# Patient Record
Sex: Male | Born: 1990 | Race: Black or African American | Hispanic: No | Marital: Single | Smoking: Former smoker
Health system: Southern US, Community
[De-identification: ages and names within clinical notes are randomized; demographics above are authoritative.]

## PROBLEM LIST (undated history)

## (undated) DIAGNOSIS — B20 Human immunodeficiency virus [HIV] disease: Secondary | ICD-10-CM

## (undated) DIAGNOSIS — Z21 Asymptomatic human immunodeficiency virus [HIV] infection status: Secondary | ICD-10-CM

## (undated) DIAGNOSIS — F1910 Other psychoactive substance abuse, uncomplicated: Secondary | ICD-10-CM

## (undated) DIAGNOSIS — F29 Unspecified psychosis not due to a substance or known physiological condition: Secondary | ICD-10-CM

## (undated) DIAGNOSIS — F32A Depression, unspecified: Secondary | ICD-10-CM

---

## 2011-09-27 ENCOUNTER — Emergency Department (HOSPITAL_COMMUNITY)
Admission: EM | Admit: 2011-09-27 | Discharge: 2011-09-27 | Disposition: A | Discharge status: Home / Self Care | Payer: No Typology Code available for payment source | Attending: Emergency Medicine | Admitting: Emergency Medicine

## 2011-09-27 ENCOUNTER — Emergency Department (HOSPITAL_COMMUNITY): Payer: Self-pay

## 2011-09-27 DIAGNOSIS — S161XXA Strain of muscle, fascia and tendon at neck level, initial encounter: Secondary | ICD-10-CM

## 2011-09-27 DIAGNOSIS — Y92410 Unspecified street and highway as the place of occurrence of the external cause: Secondary | ICD-10-CM | POA: Insufficient documentation

## 2011-09-27 DIAGNOSIS — S139XXA Sprain of joints and ligaments of unspecified parts of neck, initial encounter: Secondary | ICD-10-CM | POA: Insufficient documentation

## 2011-09-27 DIAGNOSIS — M542 Cervicalgia: Secondary | ICD-10-CM | POA: Insufficient documentation

## 2011-09-27 MED ORDER — IBUPROFEN 800 MG PO TABS
800.0000 mg | ORAL_TABLET | Freq: Once | ORAL | Status: AC
  Administered 2011-09-27: 800 mg via ORAL
  Filled 2011-09-27: qty 1

## 2011-09-27 MED ORDER — METHOCARBAMOL 500 MG PO TABS: 500.0000 mg | ORAL_TABLET | Freq: Two times a day (BID) | ORAL | Status: AC | PRN

## 2011-09-27 MED ORDER — NAPROXEN 500 MG PO TABS: 500.0000 mg | ORAL_TABLET | Freq: Two times a day (BID) | ORAL | Status: AC
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
## 2011-09-27 NOTE — Discharge Instructions (Signed)
Your xrays do no show any signs of fracture of dislocation of your neck bones - please use the medicines as prescribed, naprosyn twice a day - robaxin as needed for neck muscle spasm.  You will likely hurt more tomorrow than you do today - expect up to 10 days to fully recover.  See list below if you Israel't have a family doctor.  RESOURCE GUIDE  Dental Problems  Patients with Medicaid: [facility] [phone]                                           [phone] Phone:  [phone]                                                  Phone:  [phone]  If unable to pay or uninsured, contact:  [facility] or [facility]. to become qualified for the adult dental clinic.  Chronic Pain Problems Contact [facility]  [phone] Patients need to be referred by their primary care doctor.  Insufficient Money for Medicine Contact United Way:  call "211" or [facility] 419-472-7237.  No Primary Care Doctor Call [facility]  [phone] Other agencies that provide inexpensive medical care    [facility]  [phone]    [facility]  [phone]    [facility]  [phone]    [facility]  [phone]    [facility]  (939) [phone]    [facility]  [phone]  Psychological Services [facility] Health  [phone] [facility] Services  [phone] [facility] Mental Health   [phone] (emergency services (925) [phone])  Substance Abuse Resources [facility] and Drug Services  (970)737-4167 [facility] 2252606676 [facility] [place] [phone] Floydene Flock 719-885-9985 Residential & Outpatient Substance Abuse Program  937-517-7059  Abuse/Neglect [facility] Child Abuse Hotline (307)222-0608 [facility] Child Abuse Hotline 757 442 5384 (After Hours)  Emergency Shelter [facility] Ministries (445) [phone]  Maternity  Homes [facility] at [facility] [place] [facility] [place] 248-721-9124 [facility] [phone]  MRSA Hotline #:   223-548-2105    [facility] Resources  [facility] [place]     United Way                          [facility] Dept. [address] [place]                       [address]      [address]  [place]                                                Cristobal Goldmann Phone:  [phone]                                   Phone:  [phone]                 Phone:  [phone]  [facility] Phone:  [phone]  [facility] Child Abuse Hotline 971-313-3891 [phone] (After Hours)

## 2011-09-27 NOTE — ED Notes (Signed)
Patient transported to X-ray 

## 2011-10-13 ENCOUNTER — Emergency Department (HOSPITAL_COMMUNITY)
Admission: EM | Admit: 2011-10-13 | Discharge: 2011-10-13 | Disposition: A | Discharge status: Home / Self Care | Payer: No Typology Code available for payment source | Attending: Emergency Medicine | Admitting: Emergency Medicine

## 2011-10-13 ENCOUNTER — Encounter (HOSPITAL_COMMUNITY): Payer: Self-pay | Admitting: Emergency Medicine

## 2011-10-13 DIAGNOSIS — M549 Dorsalgia, unspecified: Secondary | ICD-10-CM

## 2011-10-13 DIAGNOSIS — G8929 Other chronic pain: Secondary | ICD-10-CM | POA: Insufficient documentation

## 2011-10-13 MED ORDER — IBUPROFEN 800 MG PO TABS
800.0000 mg | ORAL_TABLET | Freq: Once | ORAL | Status: AC
  Administered 2011-10-13: 800 mg via ORAL
  Filled 2011-10-13: qty 1

## 2011-10-13 MED ORDER — IBUPROFEN 800 MG PO TABS: 800.0000 mg | ORAL_TABLET | Freq: Three times a day (TID) | ORAL | Status: AC

## 2011-10-13 MED ORDER — CYCLOBENZAPRINE HCL 10 MG PO TABS: 10.0000 mg | ORAL_TABLET | Freq: Two times a day (BID) | ORAL | Status: AC | PRN

## 2011-10-13 NOTE — Discharge Instructions (Signed)
[name] take ibuprofen 800 mg every 6 hours with 24 hours and then when necessary. Use ice intermittantly. Giving you some back exercises that may help your back pain. There's also a list of PCP  from the list below. Pick a  doctor and followup with them. Return to the ER if you have any bowel or bladder problems or high fever. The work note for today is in your discharge papers.    RESOURCE GUIDE  Dental Problems  Patients with Medicaid: [facility] [phone] [address]                                           [phone] [address] Phone:  [phone]                                                  Phone:  [phone]  If unable to pay or uninsured, contact:  [facility] or [facility]. to become qualified for the adult dental clinic.  Chronic Pain Problems Contact [facility]  [phone] Patients need to be referred by their primary care doctor.  Insufficient Money for Medicine Contact United Way:  call "211" or [facility] (303) 073-5094.  No Primary Care Doctor Call [facility]  [phone] Other agencies that provide inexpensive medical care    [facility]  [phone]    [facility] Internal Medicine  [phone]    [facility]  [phone]    [facility]  [phone]    [facility]  (575) [phone]    [facility]  [phone]  Psychological Services [facility] Behavioral Health  [phone] [facility] Services  [phone] [facility] Mental Health   [phone] (emergency services 513-442-5139)  Substance Abuse Resources Alcohol and Drug Services  (917)829-9374 [facility] (680)885-3183 [facility] [place] 469-245-4146 Floydene Flock (502)[phone] Residential & Outpatient Substance Abuse Program  (757)[phone]  Abuse/Neglect [facility] Child Abuse Hotline 806-566-7211 [facility] Child Abuse Hotline (701) 196-0725 (After Hours)  Emergency  Shelter [facility] Ministries (256)104-1713  Maternity Homes Room at the [place] [facility] [place] (380) [phone] [facility] [phone]  MRSA Hotline #:   (660)183-7191    [facility] Resources  [facility] [place]     United Way                          [facility] [address] [place]                       [address]      [address]  Patrecia Pace  Michell Heinrich Phone:  [phone]                                   Phone:  [phone]                 Phone:  [phone]  [facility] Phone:  [phone]  [facility] Child Abuse Hotline (636)596-5353 (847)492-6621 (After Hours)   Back Exercises Back exercises help treat and prevent back injuries. The goal is to increase your strength in your belly (abdominal) and back muscles. These exercises can also help with flexibility. Start these exercises when told by your doctor. HOME CARE Back exercises include: Pelvic Tilt.  Lie on your back with your knees bent. Tilt your pelvis until the lower part of your back is against the floor. Hold this position 5 to 10 sec. Repeat this exercise 5 to 10 times.  Knee to Chest.  Pull 1 knee up against your chest and hold for 20 to 30 seconds. Repeat this with the other knee. This may be done with the other leg straight or bent, whichever feels better. Then, pull both knees up against your chest.  Sit-Ups or Curl-Ups.  Bend your knees 90 degrees. Start with tilting your pelvis, and do a partial, slow sit-up. Only lift your upper half 30 to 45 degrees off the floor. Take at least 2 to 3 seonds for each sit-up. Do not do sit-ups with your knees out straight. If partial sit-ups are difficult, simply do the above but with only tightening your belly (abdominal) muscles and holding it as told.  Hip-Lift.  Lie on your back with your knees  flexed 90 degrees. Push down with your feet and shoulders as you raise your hips 2 inches off the floor. Hold for 10 seconds, repeat 5 to 10 times.  Back Arches.  Lie on your stomach. Prop yourself up on bent elbows. Slowly press on your hands, causing an arch in your low back. Repeat 3 to 5 times.  Shoulder-Lifts.  Lie face down with arms beside your body. Keep hips and belly pressed to floor as you slowly lift your head and shoulders off the floor.  Do not overdo your exercises. Be careful in the beginning. Exercises may cause you some mild back discomfort. If the pain lasts for more than 15 minutes, stop the exercises until you see your doctor. Improvement with exercise for back problems is slow.  Document Released: 06/19/2010 Document Revised: 05/06/2011 Document Reviewed: 06/19/2010 [facility] Patient Information 2012 [place], [place].Back Exercises Back exercises help treat and prevent back injuries. The goal is to increase your strength in your belly (abdominal) and back muscles. These exercises can also help with flexibility. Start these exercises when told by your doctor. HOME CARE Back exercises include: Pelvic Tilt.  Lie on your back with your knees bent. Tilt your pelvis until the lower part of your back is against the floor. Hold this position 5 to 10 sec. Repeat this exercise 5 to 10 times.  Knee to Chest.  Pull 1 knee up against your chest and hold for 20 to 30 seconds. Repeat this with the other knee. This may be done with the other leg straight or bent, whichever feels better. Then, pull both knees up against your chest.  Sit-Ups or Curl-Ups.  Bend your knees 90 degrees. Start with tilting your pelvis, and do a partial, slow sit-up. Only lift your upper  half 30 to 45 degrees off the floor. Take at least 2 to 3 seonds for each sit-up. Do not do sit-ups with your knees out straight. If partial sit-ups are difficult, simply do the above but with only tightening your belly (abdominal)  muscles and holding it as told.  Hip-Lift.  Lie on your back with your knees flexed 90 degrees. Push down with your feet and shoulders as you raise your hips 2 inches off the floor. Hold for 10 seconds, repeat 5 to 10 times.  Back Arches.  Lie on your stomach. Prop yourself up on bent elbows. Slowly press on your hands, causing an arch in your low back. Repeat 3 to 5 times.  Shoulder-Lifts.  Lie face down with arms beside your body. Keep hips and belly pressed to floor as you slowly lift your head and shoulders off the floor.  Do not overdo your exercises. Be careful in the beginning. Exercises may cause you some mild back discomfort. If the pain lasts for more than 15 minutes, stop the exercises until you see your doctor. Improvement with exercise for back problems is slow.  Document Released: 06/19/2010 Document Revised: 05/06/2011 Document Reviewed: 06/19/2010 [facility] Patient Information 2012 [place], [place].Back Pain, Adult Back pain is very common. The pain often gets better over time. The cause of back pain is usually not dangerous. Most people can learn to manage their back pain on their own.  HOME CARE   Stay active. Start with short walks on flat ground if you can. Try to walk farther each day.   Do not sit, drive, or stand in one place for more than 30 minutes. Do not stay in bed.   Do not avoid exercise or work. Activity can help your back heal faster.   Be careful when you bend or lift an object. Bend at your knees, keep the object close to you, and do not twist.   Sleep on a firm mattress. Lie on your side, and bend your knees. If you lie on your back, put a pillow under your knees.   Only take medicines as told by your doctor.   Put ice on the injured area.   Put ice in a plastic bag.   Place a towel between your skin and the bag.   Leave the ice on for 15 to 20 minutes, 3 to 4 times a day for the first 2 to 3 days. After that, you can switch between ice and heat  packs.   Ask your doctor about back exercises or massage.   Avoid feeling anxious or stressed. Find good ways to deal with stress, such as exercise.  GET HELP RIGHT AWAY IF:   Your pain does not go away with rest or medicine.   Your pain does not go away in 1 week.   You have new problems.   You do not feel well.   The pain spreads into your legs.   You cannot control when you poop (bowel movement) or pee (urinate).   Your arms or legs feel weak or lose feeling (numbness).   You feel sick to your stomach (nauseous) or throw up (vomit).   You have belly (abdominal) pain.   You feel like you may pass out (faint).  MAKE SURE YOU:   Understand these instructions.   Will watch your condition.   Will get help right away if you are not doing well or get worse.  Document Released: 11/03/2007 Document Revised: 05/06/2011 Document Reviewed: 10/05/2010  [facility] Patient Information 2012 [place].
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
## 2011-10-13 NOTE — ED Notes (Signed)
Pt st's he was involved in [facility] 4/29.  St's he was given muscle relaxer and pain med.  St's he was able to get the muscle relaxer but was not able to get the pain med.  Also st's he continues to have low back pain

## 2011-10-18 NOTE — ED Provider Notes (Signed)
Medical screening examination/treatment/procedure(s) were performed by non-physician practitioner and as supervising physician I was immediately available for consultation/collaboration.  Reegan Bouffard R. Aiyah Scarpelli, MD 10/18/11 1610 

## 2014-02-13 ENCOUNTER — Encounter (HOSPITAL_COMMUNITY): Payer: Self-pay | Admitting: Emergency Medicine

## 2014-02-13 ENCOUNTER — Emergency Department (HOSPITAL_COMMUNITY)
Admission: EM | Admit: 2014-02-13 | Discharge: 2014-02-13 | Disposition: A | Discharge status: Home / Self Care | Payer: No Typology Code available for payment source | Attending: Emergency Medicine | Admitting: Emergency Medicine

## 2014-02-13 DIAGNOSIS — R51 Headache: Secondary | ICD-10-CM | POA: Insufficient documentation

## 2014-02-13 DIAGNOSIS — J020 Streptococcal pharyngitis: Secondary | ICD-10-CM | POA: Insufficient documentation

## 2014-02-13 DIAGNOSIS — F17200 Nicotine dependence, unspecified, uncomplicated: Secondary | ICD-10-CM | POA: Insufficient documentation

## 2014-02-13 DIAGNOSIS — A389 Scarlet fever, uncomplicated: Secondary | ICD-10-CM | POA: Insufficient documentation

## 2014-02-13 DIAGNOSIS — R42 Dizziness and giddiness: Secondary | ICD-10-CM | POA: Insufficient documentation

## 2014-02-13 DIAGNOSIS — A388 Scarlet fever with other complications: Secondary | ICD-10-CM

## 2014-02-13 DIAGNOSIS — R21 Rash and other nonspecific skin eruption: Secondary | ICD-10-CM | POA: Insufficient documentation

## 2014-02-13 MED ORDER — PENICILLIN G BENZATHINE 1200000 UNIT/2ML IM SUSP
1.2000 10*6.[IU] | Freq: Once | INTRAMUSCULAR | Status: AC
  Administered 2014-02-13: 1.2 10*6.[IU] via INTRAMUSCULAR
  Filled 2014-02-13: qty 2

## 2014-02-13 MED ORDER — ACETAMINOPHEN 500 MG PO TABS
1000.0000 mg | ORAL_TABLET | Freq: Once | ORAL | Status: AC
  Administered 2014-02-13: 1000 mg via ORAL
  Filled 2014-02-13: qty 2

## 2014-02-13 MED ORDER — MORPHINE SULFATE 4 MG/ML IJ SOLN: 4.0000 mg | Freq: Once | INTRAMUSCULAR | Status: DC

## 2014-02-13 MED ORDER — SODIUM CHLORIDE 0.9 % IV BOLUS (SEPSIS)
1000.0000 mL | Freq: Once | INTRAVENOUS | Status: AC
  Administered 2014-02-13: 1000 mL via INTRAVENOUS

## 2014-02-13 NOTE — ED Notes (Signed)
Pt reports severe headache x 3 days, no relief with otc meds. Having n/v and dizziness when he stands up. No acute distress noted at triage.

## 2014-02-13 NOTE — ED Notes (Signed)
POD C NURSE NOTIFIED OF PATIENTS TEMP = 103.1 ORAL :33 PM ,0916/2015.

## 2014-02-13 NOTE — ED Provider Notes (Signed)
CSN: [id number]     Arrival date & time 02/13/14  1224 History   First MD Initiated Contact with Patient 02/13/14 1621     Chief Complaint  Patient presents with  . Headache     (Consider location/radiation/quality/duration/timing/severity/associated sxs/prior Treatment) HPI  Patient presents with three days of fever, chills, lightheadedness, N/V, sore throat, rash, headache. Has decreased appetite. Has been taking tylenol, advil PM, Nyquil without improvement. Sore throat is improved from two days ago.  N/V only occurred yesterday, emesis was contents of his stomach.  Denies cough, neck pain or stiffness. Has known sick contacts at work with similar symptoms.  He is not known to be immunocompromised.  No recent travel.  No known tick bites.  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    History reviewed. No pertinent past medical history. History reviewed. No pertinent past surgical history. History reviewed. No pertinent family history. History  Substance Use Topics  . Smoking status: Current Every Day Smoker  . Smokeless tobacco: Not on file  . Alcohol Use: No    Review of Systems  All other systems reviewed and are negative.     Allergies  Review of patient's allergies indicates no known allergies.  Home Medications   Prior to Admission medications   Medication Sig Start Date End Date Taking? Authorizing Provider  methocarbamol (ROBAXIN) 500 MG tablet Take 500 mg by mouth 4 (four) times daily as needed. For muscle spasms    Historical Provider, MD   BP 97/80  Pulse 103  Temp(Src) 103.1 F (39.5 C) (Oral)  Resp 16  Ht  (1.575 m)  Wt 132 lb (59.875 kg)  BMI 24.14 kg/m2  SpO2 99% Physical Exam  Nursing note and vitals reviewed. Constitutional: He appears well-developed and well-nourished. No distress.  HENT:  Head: Normocephalic and atraumatic.  Mouth/Throat: Uvula is midline. Mucous membranes are not dry. No uvula swelling.  Posterior oropharyngeal edema and posterior oropharyngeal erythema present. No oropharyngeal exudate or tonsillar abscesses.  Neck: Neck supple.  Cardiovascular: Normal rate and regular rhythm.   Pulmonary/Chest: Effort normal and breath sounds normal. No respiratory distress. He has no wheezes. He has no rales.  Abdominal: Soft. He exhibits no distension and no mass. There is no tenderness. There is no rebound and no guarding.  Neurological: He is alert. He exhibits normal muscle tone.  Skin: Rash noted. He is not diaphoretic.  Erythematous papular rash over anterior chest.     ED Course  Procedures (including critical care time) Labs Review Labs Reviewed - No data to display  Imaging Review No results found.   EKG Interpretation None      Filed Vitals:   02/13/14 1630  BP: 97/80  Pulse: 103  Temp: 103.1 F (39.5 C)  Resp: 16   6:12 PM pt reports he is feeling much better.  Declines prescriptions for pain medications at home.      MDM   Final diagnoses:  Strep pharyngitis with scarlet fever    Febrile tachycardic but otherwise healthy young man with sore throat, erythematous rash, fever, lightheadedness, mild headache.  No neck stiffness or meningeal signs. No abdominal tenderness.  Not eating or drinking much secondary to decreased appetite.  IVF, tylenol, penicillin given.  Fever, absence of cough, bilateral tonsillar edema, erythematous rash.  Will treat empirically for strep.  Pt declined morphine and declined prescription for medications for home.  Discussed findings, treatment, and follow up  with patient.  Pt  given return precautions.  Pt verbalizes understanding and agrees with plan.       Trixie Dredge, PA-C 02/13/14 [phone]

## 2014-02-13 NOTE — ED Provider Notes (Signed)
Medical screening examination/treatment/procedure(s) were performed by non-physician practitioner and as supervising physician I was immediately available for consultation/collaboration.    Nelia Shi, MD 02/13/14 [phone]

## 2014-02-13 NOTE — ED Notes (Signed)
Pt reporting generalized body aches vs headache.  Pt believes he has the flu.  Sts people at work have been sick with the same symptoms as well.

## 2014-02-13 NOTE — Discharge Instructions (Signed)
Read the information below.  You may return to the Emergency Department at any time for worsening condition or any new symptoms that concern you.  Drink plenty of fluids and rest over the next few days.  Take tylenol and ibuprofen as needed for pain and fever. If you develop high fevers, difficulty swallowing or breathing, or you are unable to tolerate fluids by mouth, return to the ER immediately for a recheck.      Scarlet Fever  Scarlet fever is an infection that can develop with strep throat. Scarlet fever is caused by the strep germ (bacteria). It commonly happens to young children. It can spread from person to person (contagious). Antibiotic medicine will be given. It often takes about 24 to 48 hours before you will start to feel better. HOME CARE  Rest and get sleep.  Take your medicine as told. Finish it even if you start to feel better.  Gargle salt water to soothe your throat. Mix 1 teaspoon of salt with 8 ounces of water.  Drink enough fluids to keep your pee (urine) clear or pale yellow.  Eat soft or liquid foods if your throat hurts. Try milk, milk shakes, ice cream, frozen yogurt, soup, or instant breakfast milk drinks. Other good choices include sport drinks, smoothies, and frozen ice pops.  Only take medicines as told by your doctor.  Follow up with your doctor about test results as told.  Family members who get a sore throat or fever should see a doctor. GET HELP RIGHT AWAY IF:  You have drooling or swallowing problems.  You have trouble breathing.  Your voice changes.  You have neck pain.  You do not get better after 48 to 72 hours of treatment, or your problems get worse.  You have green, yellow-brown, or bloody spit (phlegm).  You have joint pain or leg puffiness (swelling).  You are pale, weak, or start to breathe fast.  You have a dry mouth, are not peeing, or have sunken eyes.  You have dark brown or bloody pee. MAKE SURE YOU:   Understand these  instructions.  Will watch your condition.  Will get help right away if you are not doing well or get worse. Document Released: 01/27/2011 Document Revised: 08/09/2011 Document Reviewed: 01/27/2011 [facility] Patient Information 2015 [place], [place]. This information is not intended to replace advice given to you by your health care provider. Make sure you discuss any questions you have with your health care provider.

## 2014-11-22 ENCOUNTER — Telehealth: Payer: Self-pay

## 2014-11-22 NOTE — Telephone Encounter (Signed)
Patient contacted regarding new intake appointment. Date and time given. Information given regarding documents needed to qualify for financial eligibility.  Quavion Boule K Donata Reddick, RN  

## 2014-12-17 ENCOUNTER — Ambulatory Visit: Payer: Self-pay

## 2015-09-23 ENCOUNTER — Telehealth: Payer: Self-pay

## 2015-09-23 NOTE — Telephone Encounter (Signed)
Patient contacted regarding new intake appointment. Date and time given. Information given regarding documents needed to qualify for financial eligibility.  Bronte Sabado K Omelia Marquart, RN  

## 2015-10-02 ENCOUNTER — Ambulatory Visit: Payer: Self-pay

## 2015-10-07 ENCOUNTER — Telehealth: Payer: Self-pay

## 2015-10-07 NOTE — Telephone Encounter (Signed)
Left message on machine for patient to call the office for new patient intake.   Laurell Josephsammy K King, RN

## 2015-10-22 ENCOUNTER — Telehealth: Payer: Self-pay

## 2015-10-22 NOTE — Telephone Encounter (Signed)
Multiple attempts to reach patient via phone.   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

## 2016-04-11 ENCOUNTER — Encounter (HOSPITAL_COMMUNITY): Payer: Self-pay | Admitting: Emergency Medicine

## 2016-04-11 ENCOUNTER — Emergency Department (HOSPITAL_COMMUNITY)
Admission: EM | Admit: 2016-04-11 | Discharge: 2016-04-11 | Disposition: A | Discharge status: Home / Self Care | Payer: Self-pay | Attending: Emergency Medicine | Admitting: Emergency Medicine

## 2016-04-11 DIAGNOSIS — R197 Diarrhea, unspecified: Secondary | ICD-10-CM | POA: Insufficient documentation

## 2016-04-11 DIAGNOSIS — F17210 Nicotine dependence, cigarettes, uncomplicated: Secondary | ICD-10-CM | POA: Insufficient documentation

## 2016-04-11 DIAGNOSIS — R112 Nausea with vomiting, unspecified: Secondary | ICD-10-CM | POA: Insufficient documentation

## 2016-04-11 LAB — CBC
HCT: 43.9 % (ref 39.0–52.0)
Hemoglobin: 15 g/dL (ref 13.0–17.0)
MCH: 33.7 pg (ref 26.0–34.0)
MCHC: 34.2 g/dL (ref 30.0–36.0)
MCV: 98.7 fL (ref 78.0–100.0)
Platelets: 257 10*3/uL (ref 150–400)
RBC: 4.45 MIL/uL (ref 4.22–5.81)
RDW: 12.7 % (ref 11.5–15.5)
WBC: 8.2 10*3/uL (ref 4.0–10.5)

## 2016-04-11 LAB — LIPASE, BLOOD: Lipase: 20 U/L (ref 11–51)

## 2016-04-11 LAB — URINE MICROSCOPIC-ADD ON
Bacteria, UA: NONE SEEN
RBC / HPF: NONE SEEN RBC/hpf (ref 0–5)
Squamous Epithelial / HPF: NONE SEEN

## 2016-04-11 LAB — COMPREHENSIVE METABOLIC PANEL
ALT: 22 U/L (ref 17–63)
AST: 44 U/L — ABNORMAL HIGH (ref 15–41)
Albumin: 4.4 g/dL (ref 3.5–5.0)
Alkaline Phosphatase: 61 U/L (ref 38–126)
Anion gap: 13 (ref 5–15)
BUN: 10 mg/dL (ref 6–20)
CO2: 25 mmol/L (ref 22–32)
Calcium: 9.8 mg/dL (ref 8.9–10.3)
Chloride: 102 mmol/L (ref 101–111)
Creatinine, Ser: 1.05 mg/dL (ref 0.61–1.24)
GFR calc Af Amer: >60 mL/min (ref >60)
GFR calc non Af Amer: >60 mL/min (ref >60)
Glucose, Bld: 120 mg/dL — ABNORMAL HIGH (ref 65–99)
Potassium: 3.8 mmol/L (ref 3.5–5.1)
Sodium: 140 mmol/L (ref 135–145)
Total Bilirubin: 0.8 mg/dL (ref 0.3–1.2)
Total Protein: 8.1 g/dL (ref 6.5–8.1)

## 2016-04-11 LAB — URINALYSIS, ROUTINE W REFLEX MICROSCOPIC
GLUCOSE, UA: NEGATIVE mg/dL
HGB URINE DIPSTICK: NEGATIVE
Ketones, ur: NEGATIVE mg/dL
Nitrite: NEGATIVE
PH: 6 (ref 5.0–8.0)
Protein, ur: 30 mg/dL — AB
SPECIFIC GRAVITY, URINE: 1.028 (ref 1.005–1.030)

## 2016-04-11 MED ORDER — SODIUM CHLORIDE 0.9 % IV BOLUS (SEPSIS)
1000.0000 mL | Freq: Once | INTRAVENOUS | Status: AC
  Administered 2016-04-11: 1000 mL via INTRAVENOUS

## 2016-04-11 MED ORDER — ONDANSETRON HCL 4 MG PO TABS: 4.0000 mg | ORAL_TABLET | Freq: Four times a day (QID) | ORAL | 0 refills | Status: DC

## 2016-04-11 MED ORDER — ONDANSETRON HCL 4 MG/2ML IJ SOLN
4.0000 mg | Freq: Once | INTRAMUSCULAR | Status: AC
  Administered 2016-04-11: 4 mg via INTRAVENOUS
  Filled 2016-04-11: qty 2

## 2016-04-11 NOTE — ED Provider Notes (Signed)
[facility]-EMERGENCY DEPT Provider Note   CSN: [id number] Arrival date & time: 04/11/16  0602     History   Chief Complaint Chief Complaint  Patient presents with  . Vomiting  . Abdominal Pain  . Neck Pain    HPI Lee Bridges is a 25 y.o. male who presents with nausea, vomiting, diarrhea. No significant PMH. He states that the vomiting started on Thursday. He has been unable to eat and drink. He only has pain when he vomits. Pain is in the neck, low back and abdomen. Emesis is non-bloody, non-bilious. Diarrhea is non-bloody. Reports associated chills. He reports a sick contact at work. Denies fever, chest pain, SOB, constant abdominal pain, flank pain, urinary symptoms. No previous abdominal surgeries.  HPI  History reviewed. No pertinent past medical history.  There are no active problems to display for this patient.   History reviewed. No pertinent surgical history.     Home Medications    Prior to Admission medications   Not on File    Family History History reviewed. No pertinent family history.  Social History Social History  Substance Use Topics  . Smoking status: Current Every Day Smoker    Packs/day: 0.50    Types: Cigarettes  . Smokeless tobacco: Never Used  . Alcohol use No     Allergies   Fish-derived products   Review of Systems Review of Systems  Constitutional: Positive for chills. Negative for fever.  Respiratory: Negative for shortness of breath.   Cardiovascular: Negative for chest pain.  Gastrointestinal: Positive for abdominal pain, diarrhea, nausea and vomiting. Negative for blood in stool.  Genitourinary: Negative for dysuria and flank pain.  Musculoskeletal: Positive for back pain.  All other systems reviewed and are negative.    Physical Exam Updated Vital Signs BP 136/87 (BP Location: Right Arm)   Pulse 104   Temp 97.7 F (36.5 C) (Oral)   Resp 19   Ht 5\' 2"  (1.575 m)   Wt 64.4 kg   SpO2 97%   BMI 25.97 kg/m   Physical  Exam  Constitutional: He is oriented to person, place, and time. He appears well-developed and well-nourished. No distress.  HENT:  Head: Normocephalic and atraumatic.  Eyes: Conjunctivae are normal. Pupils are equal, round, and reactive to light. Right eye exhibits no discharge. Left eye exhibits no discharge. No scleral icterus.  Neck: Normal range of motion.  No midline tenderness. No nuchal rigidity.  Cardiovascular: Regular rhythm.  Tachycardia present.  Exam reveals no gallop and no friction rub.   No murmur heard. Pulmonary/Chest: Effort normal and breath sounds normal. No respiratory distress. He has no wheezes. He has no rales. He exhibits no tenderness.  Abdominal: Soft. Bowel sounds are normal. He exhibits no distension and no mass. There is no tenderness. There is no rebound and no guarding. No hernia.  Musculoskeletal:  Mild low back tenderness - pt states doesn't hurt now  Neurological: He is alert and oriented to person, place, and time.  Skin: Skin is warm and dry.  Psychiatric: He has a normal mood and affect. His behavior is normal.  Nursing note and vitals reviewed.    ED Treatments / Results  Labs (all labs ordered are listed, but only abnormal results are displayed) Labs Reviewed  COMPREHENSIVE METABOLIC PANEL - Abnormal; Notable for the following:       Result Value   Glucose, Bld 120 (*)    AST 44 (*)    All other components within normal limits  URINALYSIS, ROUTINE W REFLEX MICROSCOPIC (NOT AT [facility]) - Abnormal; Notable for the following:    Color, Urine AMBER (*)    Bilirubin Urine SMALL (*)    Protein, ur 30 (*)    Leukocytes, UA SMALL (*)    All other components within normal limits  LIPASE, BLOOD  CBC  URINE MICROSCOPIC-ADD ON    EKG  EKG Interpretation None       Radiology No results found.  Procedures Procedures (including critical care time)  Medications Ordered in ED Medications  ondansetron (ZOFRAN) injection 4 mg (4 mg  Intravenous Given 04/11/16 0711)  sodium chloride 0.9 % bolus 1,000 mL (0 mLs Intravenous Stopped 04/11/16 0826)     Initial Impression / Assessment and Plan / ED Course  I have reviewed the triage vital signs and the nursing notes.  Pertinent labs & imaging results that were available during my care of the patient were reviewed by me and considered in my medical decision making (see chart for details).  Clinical Course    25 year old male with symptoms consistent with viral gastroenteritis. Patient is afebrile, not tachycardic or tachypneic, normotensive, and not hypoxic. CBC unremarkable. CMP remarkable for mild hyperglycemia (120). UA remarkable for small bilirubin, small leukocytes, 30 protein, 6-30 WBC. No urinary symptoms. IVF and antiemetics given with improvement of symptoms. PO challenge tolerated well. Patient is NAD, non-toxic, with stable VS. Patient is informed of clinical course, understands medical decision making process, and agrees with plan. Opportunity for questions provided and all questions answered. Return precautions given.  Final Clinical Impressions(s) / ED Diagnoses   Final diagnoses:  Nausea vomiting and diarrhea    New Prescriptions Discharge Medication List as of 04/11/2016 10:32 AM    START taking these medications   Details  ondansetron (ZOFRAN) 4 MG tablet Take 1 tablet (4 mg total) by mouth every 6 (six) hours., Starting Sun 04/11/2016, Print         Bethel BornKelly Marie Amos Micheals, PA-C 04/14/16 1515    Vanetta MuldersScott Zackowski, MD 04/14/16 1534

## 2016-04-11 NOTE — ED Triage Notes (Signed)
Pt states that he has had worsening abdominal pain starting Thursday. He states that he has had multiple episodes of n/v/d. Pt also c/o neck pain starting this morning around 0100

## 2016-04-11 NOTE — Discharge Instructions (Signed)
Drink plenty of fluids Take Zofran for nausea Return for worsening symptoms

## 2017-02-01 ENCOUNTER — Emergency Department (HOSPITAL_COMMUNITY): Payer: BLUE CROSS/BLUE SHIELD

## 2017-02-01 ENCOUNTER — Encounter (HOSPITAL_COMMUNITY): Payer: Self-pay | Admitting: Emergency Medicine

## 2017-02-01 ENCOUNTER — Emergency Department (HOSPITAL_COMMUNITY)
Admission: EM | Admit: 2017-02-01 | Discharge: 2017-02-01 | Disposition: A | Discharge status: Home / Self Care | Payer: BLUE CROSS/BLUE SHIELD | Attending: Emergency Medicine | Admitting: Emergency Medicine

## 2017-02-01 DIAGNOSIS — E860 Dehydration: Secondary | ICD-10-CM

## 2017-02-01 DIAGNOSIS — Z79899 Other long term (current) drug therapy: Secondary | ICD-10-CM | POA: Diagnosis not present

## 2017-02-01 DIAGNOSIS — R1012 Left upper quadrant pain: Secondary | ICD-10-CM | POA: Insufficient documentation

## 2017-02-01 DIAGNOSIS — F17210 Nicotine dependence, cigarettes, uncomplicated: Secondary | ICD-10-CM | POA: Diagnosis not present

## 2017-02-01 DIAGNOSIS — R109 Unspecified abdominal pain: Secondary | ICD-10-CM

## 2017-02-01 LAB — CBC
HCT: 30.2 % — ABNORMAL LOW (ref 39.0–52.0)
Hemoglobin: 10 g/dL — ABNORMAL LOW (ref 13.0–17.0)
MCH: 32.3 pg (ref 26.0–34.0)
MCHC: 33.1 g/dL (ref 30.0–36.0)
MCV: 97.4 fL (ref 78.0–100.0)
PLATELETS: 273 10*3/uL (ref 150–400)
RBC: 3.1 MIL/uL — ABNORMAL LOW (ref 4.22–5.81)
RDW: 13.9 % (ref 11.5–15.5)
WBC: 7.5 10*3/uL (ref 4.0–10.5)

## 2017-02-01 LAB — URINALYSIS, ROUTINE W REFLEX MICROSCOPIC
BILIRUBIN URINE: NEGATIVE
Bacteria, UA: NONE SEEN
Glucose, UA: NEGATIVE mg/dL
HGB URINE DIPSTICK: NEGATIVE
Ketones, ur: NEGATIVE mg/dL
LEUKOCYTES UA: NEGATIVE
NITRITE: NEGATIVE
PH: 5 (ref 5.0–8.0)
Protein, ur: 100 mg/dL — AB
SPECIFIC GRAVITY, URINE: 1.028 (ref 1.005–1.030)

## 2017-02-01 LAB — COMPREHENSIVE METABOLIC PANEL
ALT: 10 U/L — AB (ref 17–63)
AST: 18 U/L (ref 15–41)
Albumin: 3.1 g/dL — ABNORMAL LOW (ref 3.5–5.0)
Alkaline Phosphatase: 48 U/L (ref 38–126)
Anion gap: 9 (ref 5–15)
BILIRUBIN TOTAL: 0.6 mg/dL (ref 0.3–1.2)
BUN: 15 mg/dL (ref 6–20)
CO2: 21 mmol/L — ABNORMAL LOW (ref 22–32)
CREATININE: 1.22 mg/dL (ref 0.61–1.24)
Calcium: 8.6 mg/dL — ABNORMAL LOW (ref 8.9–10.3)
Chloride: 105 mmol/L (ref 101–111)
GFR calc Af Amer: >60 mL/min (ref >60)
GFR calc non Af Amer: >60 mL/min (ref >60)
Glucose, Bld: 138 mg/dL — ABNORMAL HIGH (ref 65–99)
POTASSIUM: 3 mmol/L — AB (ref 3.5–5.1)
Sodium: 135 mmol/L (ref 135–145)
TOTAL PROTEIN: 7.8 g/dL (ref 6.5–8.1)

## 2017-02-01 LAB — LIPASE, BLOOD: Lipase: 29 U/L (ref 11–51)

## 2017-02-01 MED ORDER — CENTRUM PO CHEW: 1.0000 | CHEWABLE_TABLET | Freq: Every day | ORAL | 0 refills | Status: DC

## 2017-02-01 MED ORDER — IOPAMIDOL (ISOVUE-300) INJECTION 61%
INTRAVENOUS | Status: AC
  Administered 2017-02-01: 100 mL
  Filled 2017-02-01: qty 100

## 2017-02-01 MED ORDER — SODIUM CHLORIDE 0.9 % IV BOLUS (SEPSIS)
1000.0000 mL | Freq: Once | INTRAVENOUS | Status: AC
  Administered 2017-02-01: 1000 mL via INTRAVENOUS

## 2017-02-01 MED ORDER — LOPERAMIDE HCL 2 MG PO CAPS: 2.0000 mg | ORAL_CAPSULE | Freq: Four times a day (QID) | ORAL | 0 refills | Status: DC | PRN

## 2017-02-01 MED ORDER — POTASSIUM CHLORIDE CRYS ER 20 MEQ PO TBCR
40.0000 meq | EXTENDED_RELEASE_TABLET | Freq: Once | ORAL | Status: AC
  Administered 2017-02-01: 40 meq via ORAL
  Filled 2017-02-01: qty 2

## 2017-02-01 MED ORDER — ONDANSETRON 4 MG PO TBDP: 4.0000 mg | ORAL_TABLET | Freq: Three times a day (TID) | ORAL | 0 refills | Status: DC | PRN

## 2017-02-01 NOTE — ED Notes (Signed)
On way to CT 

## 2017-02-01 NOTE — Discharge Instructions (Signed)
You need to be rechecked by your primary care MD.

## 2017-02-01 NOTE — ED Triage Notes (Signed)
Pt states he ate [employer] 3 days ago and started having abd cramping n/v/diarrhea.

## 2017-02-01 NOTE — ED Provider Notes (Signed)
[facility] Provider Note   CSN: [id number] Arrival date & time: 02/01/17  1252     History   Chief Complaint Chief Complaint  Patient presents with  . Abdominal Cramping    HPI Lee Bridges is a 26 y.o. male.  The history is provided by the patient. No language interpreter was used.  Abdominal Cramping  This is a new problem. Episode onset: 3 days. The problem occurs constantly. The problem has been gradually worsening. Pertinent negatives include no chest pain. Nothing aggravates the symptoms. Nothing relieves the symptoms. He has tried nothing for the symptoms. The treatment provided no relief.  Pt complains of pain in his upper abdomen.  Pt developed vomitting and diarrhea after eating at [facility]   History reviewed. No pertinent past medical history.  There are no active problems to display for this patient.   History reviewed. No pertinent surgical history.     Home Medications    Prior to Admission medications   Medication Sig Start Date End Date Taking? Authorizing Provider  ondansetron (ZOFRAN) 4 MG tablet Take 1 tablet (4 mg total) by mouth every 6 (six) hours. 04/11/16   Bethel Born, PA-C    Family History History reviewed. No pertinent family history.  Social History Social History  Substance Use Topics  . Smoking status: Current Every Day Smoker    Packs/day: 0.50    Types: Cigarettes  . Smokeless tobacco: Never Used  . Alcohol use Yes     Allergies   Fish-derived products   Review of Systems Review of Systems  Cardiovascular: Negative for chest pain.  All other systems reviewed and are negative.    Physical Exam Updated Vital Signs BP 130/76 (BP Location: Right Arm)   Pulse (!) 108   Temp 99.6 F (37.6 C) (Oral)   Resp 18   SpO2 99%   Physical Exam  Constitutional: He appears well-developed and well-nourished.  HENT:  Head: Normocephalic and atraumatic.  Eyes: Conjunctivae are normal.  Neck: Neck supple.    Cardiovascular: Normal rate and regular rhythm.   No murmur heard. Pulmonary/Chest: Effort normal and breath sounds normal. No respiratory distress.  Abdominal: Soft. There is tenderness.  Tender right and left upper abdomen  Musculoskeletal: He exhibits no edema.  Neurological: He is alert.  Skin: Skin is warm and dry.  Psychiatric: He has a normal mood and affect.  Nursing note and vitals reviewed.    ED Treatments / Results  Labs (all labs ordered are listed, but only abnormal results are displayed) Labs Reviewed  COMPREHENSIVE METABOLIC PANEL - Abnormal; Notable for the following:       Result Value   Potassium 3.0 (*)    CO2 21 (*)    Glucose, Bld 138 (*)    Calcium 8.6 (*)    Albumin 3.1 (*)    ALT 10 (*)    All other components within normal limits  CBC - Abnormal; Notable for the following:    RBC 3.10 (*)    Hemoglobin 10.0 (*)    HCT 30.2 (*)    All other components within normal limits  URINALYSIS, ROUTINE W REFLEX MICROSCOPIC - Abnormal; Notable for the following:    Protein, ur 100 (*)    Squamous Epithelial / LPF 0-5 (*)    All other components within normal limits  LIPASE, BLOOD    EKG  EKG Interpretation None       Radiology Ct Abdomen Pelvis W Contrast  Result Date: 02/01/2017  CLINICAL DATA:  Abdominal pain, nausea, vomiting, and diarrhea for 3 days. EXAM: CT ABDOMEN AND PELVIS WITH CONTRAST TECHNIQUE: Multidetector CT imaging of the abdomen and pelvis was performed using the standard protocol following bolus administration of intravenous contrast. CONTRAST:  ISOVUE-300 IOPAMIDOL (ISOVUE-300) INJECTION 61% COMPARISON:  None. FINDINGS: Lower chest: The visualized lung bases are clear. Hepatobiliary: No focal liver abnormality is seen. No gallstones, gallbladder wall thickening, or biliary dilatation. Pancreas: Unremarkable. Spleen: Unremarkable. Adrenals/Urinary Tract: Unremarkable adrenal glands. No evidence of renal mass, calculi, or  hydronephrosis. Unremarkable bladder. Stomach/Bowel: The stomach is within normal limits. There is no evidence of bowel obstruction. The appendix is not clearly identified, with evaluation limited by paucity of abdominal fat, lack of oral contrast, and nondilated lower abdominal and pelvic small bowel loops containing fluid. Vascular/Lymphatic: No significant vascular findings are evident. Bilateral inguinal lymph nodes measure up to 1 cm in short axis, likely benign/ reactive. Reproductive: Unremarkable prostate. Other: Trace free fluid in the pelvis and right paracolic gutter. No abdominal wall mass or hernia. Musculoskeletal: No acute osseous abnormality or suspicious osseous lesion. IMPRESSION: 1. Trace pelvic free fluid, of uncertain etiology. 2. Appendix not clearly identified, with assessment limited as above. If symptoms persist and appendicitis is a clinical concern, consider short-term follow-up CT with oral contrast. Electronically Signed   By: Sebastian Ache M.D.   On: 02/01/2017 16:23    Procedures Procedures (including critical care time)  Medications Ordered in ED Medications  iopamidol (ISOVUE-300) 61 % injection (100 mLs  Contrast Given 02/01/17 1555)     Initial Impression / Assessment and Plan / ED Course  I have reviewed the triage vital signs and the nursing notes.  Pertinent labs & imaging results that were available during my care of the patient were reviewed by me and considered in my medical decision making (see chart for details).  Clinical Course as of Feb 01 1637  Tue Feb 01, 2017  1457 ALT: (!) 10 [LS]    Clinical Course User Index [LS] Elson Areas, [place]      Final Clinical Impressions(s) / ED Diagnoses   Final diagnoses:  Abdominal cramping  Dehydration   Meds ordered this encounter  Medications  . iopamidol (ISOVUE-300) 61 % injection    [name], [name]   : cabinet override  . potassium chloride SA (K-DUR,KLOR-CON) CR tablet 40 mEq  . sodium chloride  0.9 % bolus 1,000 mL  . multivitamin-iron-minerals-folic acid (CENTRUM) chewable tablet    Sig: Chew 1 tablet by mouth daily.    Dispense:  30 tablet    Refill:  0    Order Specific Question:   Supervising Provider    Answer:   MILLER, BRIAN [[id number]]  . ondansetron (ZOFRAN ODT) 4 MG disintegrating tablet    Sig: Take 1 tablet (4 mg total) by mouth every 8 (eight) hours as needed for nausea or vomiting.    Dispense:  20 tablet    Refill:  0    Order Specific Question:   Supervising Provider    Answer:   MILLER, BRIAN [[id number]]  . loperamide (IMODIUM) 2 MG capsule    Sig: Take 1 capsule (2 mg total) by mouth 4 (four) times daily as needed for diarrhea or loose stools.    Dispense:  12 capsule    Refill:  0    Order Specific Question:   Supervising Provider    Answer:   Eber Hong [[id number]]    New Prescriptions New Prescriptions  MULTIVITAMIN-IRON-MINERALS-FOLIC ACID (CENTRUM) CHEWABLE TABLET    Chew 1 tablet by mouth daily.     Elson AreasSofia, Nazir Hacker K, PA-C 02/01/17 1647    Doug SouJacubowitz, Sam, MD 02/01/17 [phone]

## 2019-10-22 ENCOUNTER — Other Ambulatory Visit: Payer: Self-pay

## 2019-10-22 ENCOUNTER — Emergency Department (HOSPITAL_COMMUNITY): Payer: Self-pay

## 2019-10-22 ENCOUNTER — Emergency Department (HOSPITAL_COMMUNITY)
Admission: EM | Admit: 2019-10-22 | Discharge: 2019-10-22 | Disposition: A | Discharge status: Home / Self Care | Payer: Self-pay | Attending: Emergency Medicine | Admitting: Emergency Medicine

## 2019-10-22 DIAGNOSIS — Y999 Unspecified external cause status: Secondary | ICD-10-CM | POA: Insufficient documentation

## 2019-10-22 DIAGNOSIS — S61213A Laceration without foreign body of left middle finger without damage to nail, initial encounter: Secondary | ICD-10-CM | POA: Insufficient documentation

## 2019-10-22 DIAGNOSIS — Z23 Encounter for immunization: Secondary | ICD-10-CM | POA: Insufficient documentation

## 2019-10-22 DIAGNOSIS — W231XXA Caught, crushed, jammed, or pinched between stationary objects, initial encounter: Secondary | ICD-10-CM | POA: Insufficient documentation

## 2019-10-22 DIAGNOSIS — F17210 Nicotine dependence, cigarettes, uncomplicated: Secondary | ICD-10-CM | POA: Insufficient documentation

## 2019-10-22 DIAGNOSIS — Y92810 Car as the place of occurrence of the external cause: Secondary | ICD-10-CM | POA: Insufficient documentation

## 2019-10-22 DIAGNOSIS — Z79899 Other long term (current) drug therapy: Secondary | ICD-10-CM | POA: Insufficient documentation

## 2019-10-22 DIAGNOSIS — Y9389 Activity, other specified: Secondary | ICD-10-CM | POA: Insufficient documentation

## 2019-10-22 MED ORDER — LIDOCAINE HCL 2 % IJ SOLN
10.0000 mL | Freq: Once | INTRAMUSCULAR | Status: DC
  Filled 2019-10-22: qty 20

## 2019-10-22 MED ORDER — TETANUS-DIPHTH-ACELL PERTUSSIS 5-2.5-18.5 LF-MCG/0.5 IM SUSP
0.5000 mL | Freq: Once | INTRAMUSCULAR | Status: AC
  Administered 2019-10-22: 0.5 mL via INTRAMUSCULAR
  Filled 2019-10-22: qty 0.5

## 2019-10-22 NOTE — ED Triage Notes (Addendum)
Pt here for eval of injury to L middle finger last night at 5:30 pm while working on a car. Arrives with it bandaged up and reports 1.5 in lac that bled all night. Bleeding controlled at this time. Unknown last tetanus shot.

## 2019-10-22 NOTE — Discharge Instructions (Signed)
Thank you for allowing me to care for you today. Please return to the emergency department if you have new or worsening symptoms. Take your medications as instructed.  ° °

## 2019-10-22 NOTE — ED Provider Notes (Signed)
[place] EMERGENCY DEPARTMENT Provider Note   CSN: [id number] Arrival date & time: 10/22/19  8416     History Chief Complaint  Patient presents with  . Finger Injury    Lee Bridges is a 29 y.o. male.  Patient is a 29 year old male with no significant past medical history presenting to the emergency department for finger injury.  Patient reports that yesterday he was working on his mom's car and his finger got stuck between the car and the jack causing a laceration.  Reports pain and bleeding to the middle finger.  He wrapped of the laceration but reports that he continued to bleed and that is why he presented today.  Unknown last tetanus.        No past medical history on file.  There are no problems to display for this patient.   No past surgical history on file.     No family history on file.  Social History   Tobacco Use  . Smoking status: Current Every Day Smoker    Packs/day: 0.50    Types: Cigarettes  . Smokeless tobacco: Never Used  Substance Use Topics  . Alcohol use: Yes  . Drug use: No    Home Medications Prior to Admission medications   Medication Sig Start Date End Date Taking? Authorizing Provider  loperamide (IMODIUM) 2 MG capsule Take 1 capsule (2 mg total) by mouth 4 (four) times daily as needed for diarrhea or loose stools. 02/01/17   Fransico Meadow, PA-C  multivitamin-iron-minerals-folic acid (CENTRUM) chewable tablet Chew 1 tablet by mouth daily. 02/01/17   Fransico Meadow, PA-C  ondansetron (ZOFRAN ODT) 4 MG disintegrating tablet Take 1 tablet (4 mg total) by mouth every 8 (eight) hours as needed for nausea or vomiting. 02/01/17   Fransico Meadow, PA-C  ondansetron (ZOFRAN) 4 MG tablet Take 1 tablet (4 mg total) by mouth every 6 (six) hours. 04/11/16   Recardo Evangelist, PA-C    Allergies    Fish-derived products  Review of Systems   Review of Systems  Constitutional: Negative for fever.  Musculoskeletal: Positive for  arthralgias.  Skin: Positive for wound.  Allergic/Immunologic: Negative for immunocompromised state.  Hematological: Does not bruise/bleed easily.  All other systems reviewed and are negative.   Physical Exam Updated Vital Signs BP (!) 144/93 (BP Location: Right Arm)   Pulse 87   Temp 98.4 F (36.9 C) (Oral)   Resp 18   SpO2 99%   Physical Exam Vitals and nursing note reviewed.  Constitutional:      General: He is not in acute distress.    Appearance: Normal appearance. He is not ill-appearing, toxic-appearing or diaphoretic.  HENT:     Head: Normocephalic.  Eyes:     Conjunctiva/sclera: Conjunctivae normal.  Pulmonary:     Effort: Pulmonary effort is normal.  Musculoskeletal:     Right hand: Laceration present.     Comments: There is a irregular shaped laceration to the dorsal lateral aspect of the left middle finger at the PIP.  Decreased range of motion at the PIP secondary to pain.  Normal capillary refill and sensation  Skin:    General: Skin is dry.     Capillary Refill: Capillary refill takes less than 2 seconds.  Neurological:     General: No focal deficit present.     Mental Status: He is alert.     Sensory: No sensory deficit.  Psychiatric:        Mood  and Affect: Mood normal.     ED Results / Procedures / Treatments   Labs (all labs ordered are listed, but only abnormal results are displayed) Labs Reviewed - No data to display  EKG None  Radiology DG Hand Complete Left  Result Date: 10/22/2019 CLINICAL DATA:  MIDDLE FINGER INJURY, INJURY TO LEFT HAND WHILE WORKING ON CAR, DISTAL THIRD DIGIT EXAM: LEFT HAND - COMPLETE 3+ VIEW COMPARISON:  NONE FINDINGS: NO SIGNS OF ACUTE FRACTURE, DISLOCATION OR RADIOPAQUE FOREIGN BODY. SOFT TISSUE INJURY APPARENT ALONG THE RADIAL ASPECT OF THE DISTAL LONG FINGER ADJACENT TO THE INTERPHALANGEAL JOINT. IMPRESSION: Soft tissue swelling without fracture or foreign body. Electronically Signed   By: Donzetta Kohut M.D.   On:  10/22/2019 10:46    Procedures .Marland KitchenLaceration Repair  Date/Time: 10/22/2019 11:39 AM Performed by: Arlyn Dunning, PA-C Authorized by: Arlyn Dunning, PA-C   Consent:    Consent obtained:  Verbal   Consent given by:  Patient   Risks discussed:  Infection, need for additional repair, pain, poor cosmetic result and poor wound healing   Alternatives discussed:  No treatment and delayed treatment Universal protocol:    Procedure explained and questions answered to patient or proxy's satisfaction: yes     Relevant documents present and verified: yes     Test results available and properly labeled: yes     Imaging studies available: yes     Required blood products, implants, devices, and special equipment available: yes     Site/side marked: yes     Immediately prior to procedure, a time out was called: yes     Patient identity confirmed:  Verbally with patient Anesthesia (see MAR for exact dosages):    Anesthesia method:  Local infiltration   Local anesthetic:  Lidocaine 2% w/o epi Laceration details:    Location:  Finger   Finger location:  L long finger   Length (cm):  3   Depth (mm):  4 Repair type:    Repair type:  Simple Pre-procedure details:    Preparation:  Patient was prepped and draped in usual sterile fashion Exploration:    Hemostasis achieved with:  Direct pressure   Wound exploration: wound explored through full range of motion   Treatment:    Area cleansed with:  Soap and water and Shur-Clens   Amount of cleaning:  Standard   Irrigation solution:  Sterile saline   Irrigation method:  Syringe Skin repair:    Repair method:  Sutures   Suture size:  4-0   Suture material:  Nylon   Suture technique:  Simple interrupted   Number of sutures:  7 Approximation:    Approximation:  Close Post-procedure details:    Dressing:  Antibiotic ointment, non-adherent dressing and bulky dressing   Patient tolerance of procedure:  Tolerated well, no immediate  complications   (including critical care time)  Medications Ordered in ED Medications  lidocaine (XYLOCAINE) 2 % (with pres) injection 200 mg (has no administration in time range)  Tdap (BOOSTRIX) injection 0.5 mL (0.5 mLs Intramuscular Given 10/22/19 1045)    ED Course  I have reviewed the triage vital signs and the nursing notes.  Pertinent labs & imaging results that were available during my care of the patient were reviewed by me and considered in my medical decision making (see chart for details).  Clinical Course as of Oct 21 1137  Mon Oct 22, 2019  1137 Patient presenting with finger injury which occurred at 5 PM  last night.  X-rays reassuring.  Sutured by myself.  Advised on return precautions.   [KM]    Clinical Course User Index [KM] Jeral Pinch   MDM Rules/Calculators/A&P                      Based on review of vitals, medical screening exam, lab work and/or imaging, there does not appear to be an acute, emergent etiology for the patient's symptoms. Counseled pt on good return precautions and encouraged both PCP and ED follow-up as needed.  Prior to discharge, I also discussed incidental imaging findings with patient in detail and advised appropriate, recommended follow-up in detail.  Clinical Impression: 1. Laceration of left middle finger without foreign body without damage to nail, initial encounter     Disposition: Discharge  Prior to providing a prescription for a controlled substance, I independently reviewed the patient's recent prescription history on the [place] Controlled Substance Reporting System. The patient had no recent or regular prescriptions and was deemed appropriate for a brief, less than 3 day prescription of narcotic for acute analgesia.  This note was prepared with assistance of Conservation officer, historic buildings. Occasional wrong-word or sound-a-like substitutions may have occurred due to the inherent limitations of voice  recognition software.  Final Clinical Impression(s) / ED Diagnoses Final diagnoses:  Laceration of left middle finger without foreign body without damage to nail, initial encounter    Rx / DC Orders ED Discharge Orders    None       Jeral Pinch 10/22/19 1140    Little, Ambrose Finland, MD 10/22/19 1143

## 2020-06-13 ENCOUNTER — Other Ambulatory Visit: Payer: Self-pay

## 2020-06-13 DIAGNOSIS — Z20822 Contact with and (suspected) exposure to covid-19: Secondary | ICD-10-CM

## 2020-06-15 LAB — SARS-COV-2, NAA 2 DAY TAT

## 2020-06-15 LAB — NOVEL CORONAVIRUS, NAA: SARS-CoV-2, NAA: NOT DETECTED

## 2020-08-08 ENCOUNTER — Other Ambulatory Visit: Payer: Self-pay

## 2020-08-08 ENCOUNTER — Ambulatory Visit (HOSPITAL_COMMUNITY)
Admission: EM | Admit: 2020-08-08 | Discharge: 2020-08-08 | Disposition: A | Discharge status: Home / Self Care | Payer: No Payment, Other | Attending: Psychiatry | Admitting: Psychiatry

## 2020-08-08 DIAGNOSIS — F17210 Nicotine dependence, cigarettes, uncomplicated: Secondary | ICD-10-CM | POA: Diagnosis not present

## 2020-08-08 DIAGNOSIS — F331 Major depressive disorder, recurrent, moderate: Secondary | ICD-10-CM

## 2020-08-08 DIAGNOSIS — F333 Major depressive disorder, recurrent, severe with psychotic symptoms: Secondary | ICD-10-CM | POA: Insufficient documentation

## 2020-08-08 DIAGNOSIS — F515 Nightmare disorder: Secondary | ICD-10-CM | POA: Diagnosis not present

## 2020-08-08 DIAGNOSIS — F411 Generalized anxiety disorder: Secondary | ICD-10-CM | POA: Insufficient documentation

## 2020-08-08 DIAGNOSIS — Z9151 Personal history of suicidal behavior: Secondary | ICD-10-CM | POA: Insufficient documentation

## 2020-08-08 DIAGNOSIS — Z62810 Personal history of physical and sexual abuse in childhood: Secondary | ICD-10-CM | POA: Insufficient documentation
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
## 2020-08-08 NOTE — Discharge Instructions (Addendum)
Please come to [facility] (this facility) during walk in hours for appointment with psychiatrist for further medication management and for therapy.   Walk in hours are 8-11 AM Monday through Thursday for medication management.It is first come, first -serve; it is best to arrive by 7:00 AM. On Friday from 1 pm to 4 pm for therapy intake only. Please arrive by 12:00 pm as it is  first come, first -serve.   When you arrive please go upstairs for your appointment. If you are unsure of where to go, inform the front desk that you are here for a walk in appointment and they will assist you with directions upstairs.  Address:  [address], in [place], [address] Ph: [phone]   

## 2020-10-09 ENCOUNTER — Emergency Department (HOSPITAL_COMMUNITY)
Admission: EM | Admit: 2020-10-09 | Discharge: 2020-10-09 | Disposition: A | Discharge status: Other Acute Inpatient Hospital | Payer: Self-pay | Attending: Emergency Medicine | Admitting: Emergency Medicine

## 2020-10-09 ENCOUNTER — Other Ambulatory Visit: Payer: Self-pay

## 2020-10-09 ENCOUNTER — Emergency Department (HOSPITAL_COMMUNITY): Payer: Self-pay

## 2020-10-09 ENCOUNTER — Encounter (HOSPITAL_COMMUNITY): Payer: Self-pay | Admitting: Emergency Medicine

## 2020-10-09 ENCOUNTER — Ambulatory Visit (HOSPITAL_COMMUNITY)
Admission: EM | Admit: 2020-10-09 | Discharge: 2020-10-10 | Disposition: A | Discharge status: Home / Self Care | Payer: No Payment, Other | Source: Home / Self Care

## 2020-10-09 ENCOUNTER — Ambulatory Visit (HOSPITAL_COMMUNITY)
Admission: EM | Admit: 2020-10-09 | Discharge: 2020-10-09 | Disposition: A | Discharge status: Other Acute Inpatient Hospital | Payer: No Payment, Other | Attending: Psychiatry | Admitting: Psychiatry

## 2020-10-09 DIAGNOSIS — Y909 Presence of alcohol in blood, level not specified: Secondary | ICD-10-CM | POA: Insufficient documentation

## 2020-10-09 DIAGNOSIS — R45850 Homicidal ideations: Secondary | ICD-10-CM | POA: Insufficient documentation

## 2020-10-09 DIAGNOSIS — R45851 Suicidal ideations: Secondary | ICD-10-CM | POA: Insufficient documentation

## 2020-10-09 DIAGNOSIS — F1994 Other psychoactive substance use, unspecified with psychoactive substance-induced mood disorder: Secondary | ICD-10-CM | POA: Insufficient documentation

## 2020-10-09 DIAGNOSIS — B20 Human immunodeficiency virus [HIV] disease: Secondary | ICD-10-CM

## 2020-10-09 DIAGNOSIS — F1524 Other stimulant dependence with stimulant-induced mood disorder: Secondary | ICD-10-CM | POA: Insufficient documentation

## 2020-10-09 DIAGNOSIS — Z20822 Contact with and (suspected) exposure to covid-19: Secondary | ICD-10-CM | POA: Insufficient documentation

## 2020-10-09 DIAGNOSIS — Z21 Asymptomatic human immunodeficiency virus [HIV] infection status: Secondary | ICD-10-CM | POA: Insufficient documentation

## 2020-10-09 DIAGNOSIS — F32A Depression, unspecified: Secondary | ICD-10-CM | POA: Insufficient documentation

## 2020-10-09 DIAGNOSIS — R112 Nausea with vomiting, unspecified: Secondary | ICD-10-CM | POA: Insufficient documentation

## 2020-10-09 DIAGNOSIS — K6289 Other specified diseases of anus and rectum: Secondary | ICD-10-CM | POA: Insufficient documentation

## 2020-10-09 DIAGNOSIS — F17210 Nicotine dependence, cigarettes, uncomplicated: Secondary | ICD-10-CM | POA: Insufficient documentation

## 2020-10-09 DIAGNOSIS — F1590 Other stimulant use, unspecified, uncomplicated: Secondary | ICD-10-CM | POA: Insufficient documentation

## 2020-10-09 DIAGNOSIS — F1520 Other stimulant dependence, uncomplicated: Secondary | ICD-10-CM

## 2020-10-09 DIAGNOSIS — R197 Diarrhea, unspecified: Secondary | ICD-10-CM | POA: Insufficient documentation

## 2020-10-09 DIAGNOSIS — F1420 Cocaine dependence, uncomplicated: Secondary | ICD-10-CM | POA: Insufficient documentation

## 2020-10-09 DIAGNOSIS — F419 Anxiety disorder, unspecified: Secondary | ICD-10-CM | POA: Insufficient documentation

## 2020-10-09 DIAGNOSIS — R1110 Vomiting, unspecified: Secondary | ICD-10-CM

## 2020-10-09 DIAGNOSIS — R440 Auditory hallucinations: Secondary | ICD-10-CM | POA: Insufficient documentation

## 2020-10-09 DIAGNOSIS — R441 Visual hallucinations: Secondary | ICD-10-CM | POA: Insufficient documentation

## 2020-10-09 DIAGNOSIS — F1594 Other stimulant use, unspecified with stimulant-induced mood disorder: Secondary | ICD-10-CM

## 2020-10-09 HISTORY — DX: Human immunodeficiency virus (HIV) disease: B20

## 2020-10-09 HISTORY — DX: Asymptomatic human immunodeficiency virus (hiv) infection status: Z21

## 2020-10-09 LAB — RAPID URINE DRUG SCREEN, HOSP PERFORMED
Amphetamines: POSITIVE — AB
Barbiturates: NOT DETECTED
Benzodiazepines: NOT DETECTED
Cocaine: POSITIVE — AB
Opiates: NOT DETECTED
Tetrahydrocannabinol: POSITIVE — AB

## 2020-10-09 LAB — CBC
HCT: 33.2 % — ABNORMAL LOW (ref 39.0–52.0)
Hemoglobin: 11 g/dL — ABNORMAL LOW (ref 13.0–17.0)
MCH: 34.2 pg — ABNORMAL HIGH (ref 26.0–34.0)
MCHC: 33.1 g/dL (ref 30.0–36.0)
MCV: 103.1 fL — ABNORMAL HIGH (ref 80.0–100.0)
Platelets: 263 10*3/uL (ref 150–400)
RBC: 3.22 MIL/uL — ABNORMAL LOW (ref 4.22–5.81)
RDW: 12.1 % (ref 11.5–15.5)
WBC: 3.1 10*3/uL — ABNORMAL LOW (ref 4.0–10.5)
nRBC: 0 % (ref 0.0–0.2)

## 2020-10-09 LAB — COMPREHENSIVE METABOLIC PANEL
ALT: 14 U/L (ref 0–44)
AST: 25 U/L (ref 15–41)
Albumin: 3.3 g/dL — ABNORMAL LOW (ref 3.5–5.0)
Alkaline Phosphatase: 53 U/L (ref 38–126)
Anion gap: 5 (ref 5–15)
BUN: 13 mg/dL (ref 6–20)
CO2: 28 mmol/L (ref 22–32)
Calcium: 8.8 mg/dL — ABNORMAL LOW (ref 8.9–10.3)
Chloride: 105 mmol/L (ref 98–111)
Creatinine, Ser: 0.94 mg/dL (ref 0.61–1.24)
GFR, Estimated: >60 mL/min (ref >60)
Glucose, Bld: 92 mg/dL (ref 70–99)
Potassium: 3.7 mmol/L (ref 3.5–5.1)
Sodium: 138 mmol/L (ref 135–145)
Total Bilirubin: 0.5 mg/dL (ref 0.3–1.2)
Total Protein: 8 g/dL (ref 6.5–8.1)

## 2020-10-09 LAB — ETHANOL: Alcohol, Ethyl (B): <10 mg/dL (ref <10)

## 2020-10-09 LAB — RESP PANEL BY RT-PCR (FLU A&B, COVID) ARPGX2
Influenza A by PCR: NEGATIVE
Influenza B by PCR: NEGATIVE
SARS Coronavirus 2 by RT PCR: NEGATIVE

## 2020-10-09 LAB — SALICYLATE LEVEL: Salicylate Lvl: <7 mg/dL — ABNORMAL LOW (ref 7.0–30.0)

## 2020-10-09 LAB — ACETAMINOPHEN LEVEL: Acetaminophen (Tylenol), Serum: <10 ug/mL — ABNORMAL LOW (ref 10–30)

## 2020-10-09 MED ORDER — MAGNESIUM HYDROXIDE 400 MG/5ML PO SUSP: 30.0000 mL | Freq: Every day | ORAL | Status: DC | PRN

## 2020-10-09 MED ORDER — HYDROXYZINE HCL 25 MG PO TABS
25.0000 mg | ORAL_TABLET | Freq: Three times a day (TID) | ORAL | Status: DC | PRN
  Administered 2020-10-09: 25 mg via ORAL
  Filled 2020-10-09: qty 1
  Filled 2020-10-09: qty 10

## 2020-10-09 MED ORDER — DOXYCYCLINE HYCLATE 100 MG PO TABS
100.0000 mg | ORAL_TABLET | Freq: Two times a day (BID) | ORAL | Status: DC
  Filled 2020-10-09: qty 1

## 2020-10-09 MED ORDER — TRAZODONE HCL 50 MG PO TABS
50.0000 mg | ORAL_TABLET | Freq: Every evening | ORAL | Status: DC | PRN
  Administered 2020-10-09: 50 mg via ORAL
  Filled 2020-10-09: qty 1
  Filled 2020-10-09: qty 7

## 2020-10-09 MED ORDER — IOHEXOL 9 MG/ML PO SOLN
ORAL | Status: AC
  Administered 2020-10-09: 500 mL
  Filled 2020-10-09: qty 1000

## 2020-10-09 MED ORDER — STERILE WATER FOR INJECTION IJ SOLN
INTRAMUSCULAR | Status: AC
  Filled 2020-10-09: qty 10

## 2020-10-09 MED ORDER — CEFTRIAXONE SODIUM 500 MG IJ SOLR
500.0000 mg | Freq: Once | INTRAMUSCULAR | Status: AC
  Administered 2020-10-09: 500 mg via INTRAMUSCULAR
  Filled 2020-10-09: qty 500

## 2020-10-09 MED ORDER — DOXYCYCLINE HYCLATE 100 MG PO CAPS: 100.0000 mg | ORAL_CAPSULE | Freq: Two times a day (BID) | ORAL | 0 refills | Status: AC

## 2020-10-09 MED ORDER — ONDANSETRON 4 MG PO TBDP
4.0000 mg | ORAL_TABLET | Freq: Once | ORAL | Status: AC
  Administered 2020-10-09: 4 mg via ORAL
  Filled 2020-10-09: qty 1

## 2020-10-09 MED ORDER — ESCITALOPRAM OXALATE 5 MG PO TABS
5.0000 mg | ORAL_TABLET | Freq: Every day | ORAL | Status: DC
  Administered 2020-10-09: 5 mg via ORAL
  Filled 2020-10-09: qty 1

## 2020-10-09 MED ORDER — ALUM & MAG HYDROXIDE-SIMETH 200-200-20 MG/5ML PO SUSP
30.0000 mL | ORAL | Status: DC | PRN
  Administered 2020-10-10: 30 mL via ORAL
  Filled 2020-10-09: qty 30

## 2020-10-09 MED ORDER — DOXYCYCLINE HYCLATE 100 MG PO TABS
100.0000 mg | ORAL_TABLET | Freq: Two times a day (BID) | ORAL | Status: DC
  Administered 2020-10-09: 100 mg via ORAL
  Filled 2020-10-09: qty 1

## 2020-10-09 MED ORDER — ACETAMINOPHEN 325 MG PO TABS: 650.0000 mg | ORAL_TABLET | Freq: Four times a day (QID) | ORAL | Status: DC | PRN

## 2020-10-09 MED ORDER — DOXYCYCLINE HYCLATE 100 MG PO TABS
100.0000 mg | ORAL_TABLET | Freq: Once | ORAL | Status: AC
  Administered 2020-10-09: 100 mg via ORAL
  Filled 2020-10-09: qty 1
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
## 2020-10-09 NOTE — ED Notes (Signed)
Blue locker 

## 2020-10-09 NOTE — Discharge Instructions (Addendum)
Patient is instructed to follow through with recommendations from Infectious Disease.

## 2020-10-09 NOTE — ED Provider Notes (Signed)
Care assumed from [facility], PA-C at shift change with labs pending.   In brief, this patient is a 30 y.o. M who presents for evaluation of suicidal ideation x3 days.  He initially presented to behavioral health urgent care.  He had a TTS consultation but needed medical clearance was sent to the ED.  He states he has had some nausea and vomiting and some diffuse abdominal pain.  He does admit to having anal intercourse with males though he has not done so in several months.  He reports he has had suicidal ideations.  He did not have a specific plan.  Please see note from previous provider for full history/physical exam.  Physical Exam  BP 106/72 (BP Location: Left Arm)   Pulse 83   Temp 98.7 F (37.1 C) (Oral)   Resp 16   Ht 5\' 2"  (1.575 m)   Wt 59.9 kg   SpO2 100%   BMI 24.14 kg/m   Physical Exam  Resting comfortably, NAD  ED Course/Procedures     Procedures  Results for orders placed or performed during the hospital encounter of 10/09/20 (from the past 24 hour(s))  Comprehensive metabolic panel     Status: Abnormal   Collection Time: 10/09/20 10:08 AM  Result Value Ref Range   Sodium 138 135 - 145 mmol/L   Potassium 3.7 3.5 - 5.1 mmol/L   Chloride 105 98 - 111 mmol/L   CO2 28 22 - 32 mmol/L   Glucose, Bld 92 70 - 99 mg/dL   BUN 13 6 - 20 mg/dL   Creatinine, Ser 12/09/20 0.61 - 1.24 mg/dL   Calcium 8.8 (L) 8.9 - 10.3 mg/dL   Total Protein 8.0 6.5 - 8.1 g/dL   Albumin 3.3 (L) 3.5 - 5.0 g/dL   AST 25 15 - 41 U/L   ALT 14 0 - 44 U/L   Alkaline Phosphatase 53 38 - 126 U/L   Total Bilirubin 0.5 0.3 - 1.2 mg/dL   GFR, Estimated 1.50 >56 mL/min   Anion gap 5 5 - 15  Ethanol     Status: None   Collection Time: 10/09/20 10:08 AM  Result Value Ref Range   Alcohol, Ethyl (B) <10 <10 mg/dL  Salicylate level     Status: Abnormal   Collection Time: 10/09/20 10:08 AM  Result Value Ref Range   Salicylate Lvl <7.0 (L) 7.0 - 30.0 mg/dL  Acetaminophen level     Status: Abnormal    Collection Time: 10/09/20 10:08 AM  Result Value Ref Range   Acetaminophen (Tylenol), Serum <10 (L) 10 - 30 ug/mL  cbc     Status: Abnormal   Collection Time: 10/09/20 10:08 AM  Result Value Ref Range   WBC 3.1 (L) 4.0 - 10.5 K/uL   RBC 3.22 (L) 4.22 - 5.81 MIL/uL   Hemoglobin 11.0 (L) 13.0 - 17.0 g/dL   HCT 12/09/20 (L) 94.8 - 01.6 %   MCV 103.1 (H) 80.0 - 100.0 fL   MCH 34.2 (H) 26.0 - 34.0 pg   MCHC 33.1 30.0 - 36.0 g/dL   RDW 55.3 74.8 - 27.0 %   Platelets 263 150 - 400 K/uL   nRBC 0.0 0.0 - 0.2 %  Rapid urine drug screen (hospital performed)     Status: Abnormal   Collection Time: 10/09/20 11:45 AM  Result Value Ref Range   Opiates NONE DETECTED NONE DETECTED   Cocaine POSITIVE (A) NONE DETECTED   Benzodiazepines NONE DETECTED NONE DETECTED  Amphetamines POSITIVE (A) NONE DETECTED   Tetrahydrocannabinol POSITIVE (A) NONE DETECTED   Barbiturates NONE DETECTED NONE DETECTED    MDM    PLAN: Patient pending COVID test.  Once COVID is back, patient can be discharged back to behavioral health.  Previous provider provider discussed with Doran Heater [facility] NP)  MDM:  CT scan does show evidence of proctitis.  Patient given ceftriaxone here in the ED as well as doxycycline and prescription for him to go home with.  COVID test is negative.  Patient has been given referral to ID, started on doxycycline.  I have ordered doxycycline while he is at behavioral health.  When he gets discharged, he has a prescription for it.  Nira Conn, NP accepts patient for transport back to [facility].   1. Nausea vomiting and diarrhea   2. Emesis   3. HIV (human immunodeficiency virus infection) (HCC)   4. Suicidal ideations   5. Homicidal ideation   6. Proctitis    Portions of this note were generated with Scientist, clinical (histocompatibility and immunogenetics). Dictation errors may occur despite best attempts at proofreading.     Maxwell Caul, PA-C 10/09/20 2007    Long, Joshua G, MD 10/13/20 [phone]

## 2020-10-09 NOTE — ED Notes (Signed)
Pt transported to xray 

## 2020-10-09 NOTE — ED Triage Notes (Signed)
Pt states he has been having suicidal thoughts. Pt states he BH urgent care and was sent here for medical clearance. States he was diagnosed with HIV in 2016 and has not taken any medication for HIV since then. Pt can go back to [facility] after he is cleared.

## 2020-10-09 NOTE — ED Provider Notes (Signed)
Behavioral Health Urgent Care Medical Screening Exam  Patient Name: Lee Bridges MRN: [id number] Date of Evaluation: 10/09/20 Chief Complaint:   Diagnosis:  Final diagnoses:  Methamphetamine-induced mood disorder (HCC)  Cocaine use disorder, severe, dependence (HCC)   At the time of this note, the patient has not been evaluated by TTS. Disposition is pending TTS evaluation and review with oncoming provider  History of Present illness: Lee Bridges is a 30 y.o. male with a history of anxiety, depression, methamphetamine abuse, and cocaine abuse who presents to [facility] due to worsening depression and anxiety. Patient states that he is having homicidal thoughts toward his family. He denies any intent or plan. He reports suicidal thoughts without intent or plan. Patient reports that he uses methamphetamine about every 2 days and cocaine daily. Reports last use of cocaine was 10/07/20 and last use of meth was 10/06/2020. Patent reports that he feels more depressed and anxious when he is not using cocaine/meth and that he uses to help deal with his emotions. He reports auditory hallucinations of voices that he cannot understand. States that he has VH of people that he does not know. States AVH is worse when he is not using substances.   Patient disclosed that he has been HIV positive for approximately 6 years. He states that he has never taken HIV medications. He is concerned because he has been having nausea in the mornings and decreased appetite. He feels that he has been losing weight, but he does not know how much weight. Discussed importance of establishing care with infectious disease. Patient could benefit from referral to infectious disease and the community health and wellness center.   Patient states that is interested in substance detox and residential substance abuse treatment.   Psychiatric Specialty Exam  Presentation  General Appearance:Appropriate for Environment; Casual  Eye  Contact:Good  Speech:Clear and Coherent; Normal Rate  Speech Volume:Normal  Handedness:No data recorded  Mood and Affect  Mood:Depressed; Anxious  Affect:Congruent; Depressed   Thought Process  Thought Processes:Coherent; Goal Directed; Linear  Descriptions of Associations:Intact  Orientation:Full (Time, Place and Person)  Thought Content:WDL  Diagnosis of Schizophrenia or Schizoaffective disorder in past: No   Hallucinations:Auditory; Visual hears voices that he cannot understand sees people  Ideas of Reference:None  Suicidal Thoughts:Yes, Active Without Intent; Without Plan  Homicidal Thoughts:Yes, Active Without Intent; Without Plan   Sensorium  Memory:Immediate Good; Recent Good; Remote Good  Judgment:Fair  Insight:Fair   Executive Functions  Concentration:Fair  Attention Span:Fair  Recall:Fair  Fund of Knowledge:Fair  Language:Good   Psychomotor Activity  Psychomotor Activity:Restlessness   Assets  Assets:Communication Skills; Desire for Improvement; Social Support   Sleep  Sleep:Poor  Number of hours: No data recorded  Nutritional Assessment (For OBS and FBC admissions only) Has the patient had a weight loss or gain of 10 pounds or more in the last 3 months?: No Has the patient had a decrease in food intake/or appetite?: Yes Does the patient have dental problems?: No Does the patient have eating habits or behaviors that may be indicators of an eating disorder including binging or inducing vomiting?: No Has the patient recently lost weight without trying?: Yes, 2-13 lbs. Has the patient been eating poorly because of a decreased appetite?: Yes Malnutrition Screening Tool Score: 2    Physical Exam: Physical Exam Constitutional:      General: He is not in acute distress.    Appearance: He is not ill-appearing, toxic-appearing or diaphoretic.  HENT:     Head: Normocephalic.  Right Ear: External ear normal.     Left Ear:  External ear normal.  Eyes:     Pupils: Pupils are equal, round, and reactive to light.  Cardiovascular:     Rate and Rhythm: Normal rate.  Pulmonary:     Effort: Pulmonary effort is normal. No respiratory distress.  Musculoskeletal:        General: Normal range of motion.  Skin:    General: Skin is warm and dry.  Neurological:     Mental Status: He is alert and oriented to person, place, and time.  Psychiatric:        Mood and Affect: Mood is anxious and depressed.        Speech: Speech normal.        Behavior: Behavior is cooperative.        Thought Content: Thought content is not paranoid or delusional. Thought content includes homicidal and suicidal ideation. Thought content does not include homicidal or suicidal plan.    Review of Systems  Constitutional: Positive for malaise/fatigue and weight loss. Negative for chills, diaphoresis and fever.  HENT: Negative for congestion.   Respiratory: Negative for cough and shortness of breath.   Cardiovascular: Negative for chest pain and palpitations.  Gastrointestinal: Positive for nausea. Negative for diarrhea and vomiting.  Neurological: Negative for dizziness and seizures.  Psychiatric/Behavioral: Positive for depression and suicidal ideas. Negative for hallucinations, memory loss and substance abuse. The patient has insomnia. The patient is not nervous/anxious.   All other systems reviewed and are negative.  Blood pressure 127/86, pulse 98, temperature 98.6 F (37 C), temperature source Oral, resp. rate 18, SpO2 98 %. There is no height or weight on file to calculate BMI.  Musculoskeletal: Strength & Muscle Tone: within normal limits Gait & Station: normal Patient leans: N/A   [facility] MSE Discharge Disposition for Follow up and Recommendations: Based on my evaluation, the patient does not appear to have an emergency medical condition. Disposition is pending TTS evaluation and review with oncoming provider.   Jackelyn Poling,  NP 10/09/2020, 6:46 AM

## 2020-10-09 NOTE — ED Notes (Signed)
Patient transported to CT 

## 2020-10-09 NOTE — ED Notes (Signed)
Pt denies SI HI to this nurse at the moment

## 2020-10-09 NOTE — ED Provider Notes (Signed)
Behavioral Health Admission H&P [facility](FBC & OBS)  Date: 10/10/20 Patient Name: Lee Bridges [place] MRN: [id number] Chief Complaint:  Chief Complaint  Patient presents with  . Suicidal      Diagnoses:  Final diagnoses:  Substance induced mood disorder (HCC)  Methamphetamine use disorder, moderate (HCC)  Cocaine use disorder, severe, dependence (HCC)    HPI: Lee Bridges Neises is a 30 y.o. male with a history of anxiety, depression, methamphetamine abuse, and cocaine abuse who [facility] and anxiety. Patient was transferred to the ED for medical clearance due to N/V/D and untreated HIV.   On evaluation this evening, the patient is alert and oriented x 4. He is pleasant cooperative. He appears less anxious than earlier today. Patient states that he continues to have homicidal thoughts toward his family. He denies any intent or plan. He reports suicidal thoughts without intent or plan. Patient reports that he uses methamphetamine about every 2 days and cocaine daily. Reports last use of cocaine was 10/07/20 and last use of meth was 10/06/2020. Patent reports that he feels more depressed and anxious when he is not using cocaine/meth and that he uses to help deal with his emotions. He reports auditory hallucinations of voices that he cannot understand. States that he has VH of people that he does not know. States AVH is worse when he is not using substances. He denies current AVH. He does not appear to be responding to internal stimuli.   Patient is interested in detox/substancda abuse treatment.  Patient encouraged to follow up with infectious disease after discharge.   PHQ 2-9:   Flow[facility] Most recent reading at 10/09/2020 11:19 PM [facility] Most recent reading at 10/09/2020  9:54 AM [facility] Most recent  reading at 08/08/2020  2:45 PM  C-SSRS RISK CATEGORY High Risk High Risk No Risk       Total Time spent with patient: 15 minutes  Musculoskeletal  Strength & Muscle Tone: within normal limits Gait & Station: normal Patient leans: N/A  Psychiatric Specialty Exam  Presentation General Appearance: Appropriate for Environment; Casual  Eye Contact:Good  Speech:Clear and Coherent; Normal Rate  Speech Volume:Normal  Handedness:No data recorded  Mood and Affect  Mood:Anxious; Depressed  Affect:Congruent; Depressed   Thought Process  Thought Processes:Coherent; Goal Directed; Linear  Descriptions of Associations:Intact  Orientation:Full (Time, Place and Person)  Thought Content:WDL  Diagnosis of Schizophrenia or Schizoaffective disorder in past: No   Hallucinations:Hallucinations: None Description of Auditory Hallucinations: hears voices that he cannot understand Description of Visual Hallucinations: sees people  Ideas of Reference:None  Suicidal Thoughts:Suicidal Thoughts: Yes, Active SI Active Intent and/or Plan: Without Intent; Without Plan  Homicidal Thoughts:Homicidal Thoughts: Yes, Active HI Active Intent and/or Plan: Without Intent; Without Plan   Sensorium  Memory:Immediate Good; Recent Good; Remote Good  Judgment:Fair  Insight:Fair   Executive Functions  Concentration:Fair  Attention Span:Fair  Recall:Fair  Fund of Knowledge:Fair  Language:Good   Psychomotor Activity  Psychomotor Activity:Psychomotor Activity: Normal   Assets  Assets:Communication Skills; Desire for Improvement; Social Support   Sleep  Sleep:Sleep: Poor   Nutritional Assessment (For OBS and FBC admissions only) Has the patient had a weight loss or gain of 10 pounds or more in the last 3 months?: No Has the patient had a decrease in food intake/or appetite?: Yes Does the patient have dental  problems?: No Does the patient have eating habits or behaviors that may be  indicators of an eating disorder including binging or inducing vomiting?: No Has the patient recently lost weight without trying?: Yes, 2-13 lbs. Has the patient been eating poorly because of a decreased appetite?: Yes Malnutrition Screening Tool Score: 2    Physical Exam Constitutional:      General: He is not in acute distress.    Appearance: He is not ill-appearing, toxic-appearing or diaphoretic.  HENT:     Head: Normocephalic.     Right Ear: External ear normal.     Left Ear: External ear normal.  Eyes:     Pupils: Pupils are equal, round, and reactive to light.  Cardiovascular:     Rate and Rhythm: Normal rate.  Pulmonary:     Effort: Pulmonary effort is normal. No respiratory distress.  Musculoskeletal:        General: Normal range of motion.  Skin:    General: Skin is warm and dry.  Neurological:     Mental Status: He is alert and oriented to person, place, and time.  Psychiatric:        Mood and Affect: Mood is anxious and depressed.        Speech: Speech normal.        Behavior: Behavior is cooperative.        Thought Content: Thought content includes homicidal and suicidal ideation. Thought content does not include homicidal or suicidal plan.    Review of Systems  Constitutional: Negative for chills, diaphoresis, fever, malaise/fatigue and weight loss.  HENT: Negative for congestion.   Respiratory: Negative for cough and shortness of breath.   Cardiovascular: Negative for chest pain and palpitations.  Gastrointestinal: Negative for diarrhea, nausea and vomiting.  Neurological: Negative for dizziness and seizures.  Psychiatric/Behavioral: Positive for depression, hallucinations, substance abuse and suicidal ideas. Negative for memory loss. The patient is nervous/anxious and has insomnia.   All other systems reviewed and are negative.   Blood pressure 134/80, pulse 100, temperature 98.6 F (37 C), temperature source Oral, resp. rate 18, SpO2 100 %. There is no  height or weight on file to calculate BMI.  Past Psychiatric History: Depression, Anxiety, Cocaine use, methamphetamine use  Is the patient at risk to self? Yes  Has the patient been a risk to self in the past 6 months? Yes .    Has the patient been a risk to self within the distant past? Yes   Is the patient a risk to others? Yes   Has the patient been a risk to others in the past 6 months? No   Has the patient been a risk to others within the distant past? No   Past Medical History:  Past Medical History:  Diagnosis Date  . HIV (human immunodeficiency virus infection) (HCC)    No past surgical history on file.  Family History: No family history on file.  Social History:  Social History   Socioeconomic History  . Marital status: Single    Spouse name: Not on file  . Number of children: Not on file  . Years of education: Not on file  . Highest education level: Not on file  Occupational History  . Not on file  Tobacco Use  . Smoking status: Current Every Day Smoker    Packs/day: 0.50    Types: Cigarettes  . Smokeless tobacco: Never Used  Substance and Sexual Activity  . Alcohol use: Yes  . Drug use: No  .  Sexual activity: Not on file  Other Topics Concern  . Not on file  Social History Narrative  . Not on file   Social Determinants of Health   Financial Resource Strain: Not on file  Food Insecurity: Not on file  Transportation Needs: Not on file  Physical Activity: Not on file  Stress: Not on file  Social Connections: Not on file  Intimate Partner Violence: Not on file    SDOH:  SDOH Screenings   Alcohol Screen: Not on file  Depression (BPZ0-2): Not on file  Financial Resource Strain: Not on file  Food Insecurity: Not on file  Housing: Not on file  Physical Activity: Not on file  Social Connections: Not on file  Stress: Not on file  Tobacco Use: High Risk  . Smoking Tobacco Use: Current Every Day Smoker  . Smokeless Tobacco Use: Never Used   Transportation Needs: Not on file    Last Labs:  Admission on 10/09/2020, Discharged on 10/09/2020  Component Date Value Ref Range Status  . Sodium 10/09/2020 138  135 - 145 mmol/L Final  . Potassium 10/09/2020 3.7  3.5 - 5.1 mmol/L Final  . Chloride 10/09/2020 105  98 - 111 mmol/L Final  . CO2 10/09/2020 28  22 - 32 mmol/L Final  . Glucose, Bld 10/09/2020 92  70 - 99 mg/dL Final   Glucose reference range applies only to samples taken after fasting for at least 8 hours.  . BUN 10/09/2020 13  6 - 20 mg/dL Final  . Creatinine, Ser 10/09/2020 0.94  0.61 - 1.24 mg/dL Final  . Calcium 58/52/7782 8.8* 8.9 - 10.3 mg/dL Final  . Total Protein 10/09/2020 8.0  6.5 - 8.1 g/dL Final  . Albumin 42/35/3614 3.3* 3.5 - 5.0 g/dL Final  . AST 43/15/4008 25  15 - 41 U/L Final  . ALT 10/09/2020 14  0 - 44 U/L Final  . Alkaline Phosphatase 10/09/2020 53  38 - 126 U/L Final  . Total Bilirubin 10/09/2020 0.5  0.3 - 1.2 mg/dL Final  . GFR, Estimated 10/09/2020 >60  >60 mL/min Final   Comment: (NOTE) Calculated using the CKD-EPI Creatinine Equation (2021)   . Anion gap 10/09/2020 5  5 - 15 Final   Performed at [facility], [address], [place], [place] [address]  . Alcohol, Ethyl (B) 10/09/2020 <10  <10 mg/dL Final   Comment: (NOTE) Lowest detectable limit for serum alcohol is 10 mg/dL.  For medical purposes only. Performed at [facility], [address] [place], [place] [address]   . Salicylate Lvl 10/09/2020 <7.0* 7.0 - 30.0 mg/dL Final   Performed at [facility], [address], [place], [place] [address]  . Acetaminophen (Tylenol), Serum 10/09/2020 <10* 10 - 30 ug/mL Final   Comment: (NOTE) Therapeutic concentrations vary significantly. A range of 10-30 ug/mL  may be an effective concentration for many patients. However, some  are best treated at concentrations outside of this range. Acetaminophen concentrations >150 ug/mL at 4 hours after ingestion  and >50 ug/mL at  12 hours after ingestion are often associated with  toxic reactions.  Performed at [facility], [address] [place], [place] [address]   . WBC 10/09/2020 3.1* 4.0 - 10.5 K/uL Final  . RBC 10/09/2020 3.22* 4.22 - 5.81 MIL/uL Final  . Hemoglobin 10/09/2020 11.0* 13.0 - 17.0 g/dL Final  . HCT 83/38/2505 33.2* 39.0 - 52.0 % Final  . MCV 10/09/2020 103.1* 80.0 -  100.0 fL Final  . MCH 10/09/2020 34.2* 26.0 - 34.0 pg Final  . MCHC 10/09/2020 33.1  30.0 - 36.0 g/dL Final  . RDW 47/82/9562 12.1  11.5 - 15.5 % Final  . Platelets 10/09/2020 263  150 - 400 K/uL Final  . nRBC 10/09/2020 0.0  0.0 - 0.2 % Final   Performed at [facility], [address] [place], [place] [address]  . Opiates 10/09/2020 NONE DETECTED  NONE DETECTED Final  . Cocaine 10/09/2020 POSITIVE* NONE DETECTED Final  . Benzodiazepines 10/09/2020 NONE DETECTED  NONE DETECTED Final  . Amphetamines 10/09/2020 POSITIVE* NONE DETECTED Final  . Tetrahydrocannabinol 10/09/2020 POSITIVE* NONE DETECTED Final  . Barbiturates 10/09/2020 NONE DETECTED  NONE DETECTED Final   Comment: (NOTE) DRUG SCREEN FOR MEDICAL PURPOSES ONLY.  IF CONFIRMATION IS NEEDED FOR ANY PURPOSE, NOTIFY LAB WITHIN 5 DAYS.  LOWEST DETECTABLE LIMITS FOR URINE DRUG SCREEN Drug Class                     Cutoff (ng/mL) Amphetamine and metabolites    1000 Barbiturate and metabolites    200 Benzodiazepine                 200 Tricyclics and metabolites     300 Opiates and metabolites        300 Cocaine and metabolites        300 THC                            50 Performed at [facility], [address], [place], [place] [address]   . SARS Coronavirus 2 by RT PCR 10/09/2020 NEGATIVE  NEGATIVE Final   Comment: (NOTE) SARS-CoV-2 target nucleic acids are NOT DETECTED.  The SARS-CoV-2 RNA is generally detectable in upper respiratory specimens during the acute phase of infection. The lowest concentration of SARS-CoV-2 viral copies this  assay can detect is 138 copies/mL. A negative result does not preclude SARS-Cov-2 infection and should not be used as the sole basis for treatment or other patient management decisions. A negative result may occur with  improper specimen collection/handling, submission of specimen other than nasopharyngeal swab, presence of viral mutation(s) within the areas targeted by this assay, and inadequate number of viral copies(<138 copies/mL). A negative result must be combined with clinical observations, patient history, and epidemiological information. The expected result is Negative.  Fact Sheet for Patients:  BloggerCourse.com  Fact Sheet for Healthcare Providers:  SeriousBroker.it  This test is no                          t yet approved or cleared by the [place] FDA and  has been authorized for detection and/or diagnosis of SARS-CoV-2 by FDA under an Emergency Use Authorization (EUA). This EUA will remain  in effect (meaning this test can be used) for the duration of the COVID-19 declaration under Section 564(b)(1) of the Act, 21 U.S.C.section 360bbb-3(b)(1), unless the authorization is terminated  or revoked sooner.      . Influenza A by PCR 10/09/2020 NEGATIVE  NEGATIVE Final  . Influenza B by PCR 10/09/2020 NEGATIVE  NEGATIVE Final   Comment: (NOTE) The Xpert Xpress SARS-CoV-2/FLU/RSV plus assay is intended as an aid in the diagnosis of influenza from Nasopharyngeal swab specimens and should not be used as a sole basis for treatment. Nasal washings and aspirates are unacceptable for  Xpert Xpress SARS-CoV-2/FLU/RSV testing.  Fact Sheet for Patients: BloggerCourse.com  Fact Sheet for Healthcare Providers: SeriousBroker.it  This test is not yet approved or cleared by the [place] FDA and has been authorized for detection and/or diagnosis of SARS-CoV-2 by FDA under an  Emergency Use Authorization (EUA). This EUA will remain in effect (meaning this test can be used) for the duration of the COVID-19 declaration under Section 564(b)(1) of the Act, 21 U.S.C. section 360bbb-3(b)(1), unless the authorization is terminated or revoked.  Performed at [facility], [address] [place], [place] [address]   Lab on 06/13/2020  Component Date Value Ref Range Status  . SARS-CoV-2, NAA 06/13/2020 Not Detected  Not Detected Final   Comment: This nucleic acid amplification test was developed and its performance characteristics determined by [employer]. Nucleic acid amplification tests include RT-PCR and TMA. This test has not been FDA cleared or approved. This test has been authorized by FDA under an Emergency Use Authorization (EUA). This test is only authorized for the duration of time the declaration that circumstances exist justifying the authorization of the emergency use of in vitro diagnostic tests for detection of SARS-CoV-2 virus and/or diagnosis of COVID-19 infection under section 564(b)(1) of the Act, 21 U.S.C. 056PVX-4(I) (1), unless the authorization is terminated or revoked sooner. When diagnostic testing is negative, the possibility of a false negative result should be considered in the context of a patient's recent exposures and the presence of clinical signs and symptoms consistent with COVID-19. An individual without symptoms of COVID-19 and who is not shedding SARS-CoV-2 virus wo                          uld expect to have a negative (not detected) result in this assay.   Marland Kitchen SARS-CoV-2, NAA 2 DAY TAT 06/13/2020 Performed   Final    Allergies: Fish-derived products  PTA Medications: (Not in a hospital admission)   Medical Decision Making  Patient was medically cleared in the ED Reviewed labs  Start lexapro 5 mg daily for depression Hydroxyzine 25 mg TID prn for anxiety Trazodone 50 mg QHS prn for sleep      Recommendations  Based on my evaluation the patient does not appear to have an emergency medical condition.  Jackelyn Poling, NP 10/10/20  2:30 AM

## 2020-10-09 NOTE — ED Notes (Signed)
Pt sleeping@this time. breathing even and unlabored. Will continue to monitor for safety 

## 2020-10-09 NOTE — ED Notes (Signed)
Patient is upset and wanting to leave, charge nurse ([place]), primary nurse Mariane Duval), and provider all notified.

## 2020-10-09 NOTE — ED Notes (Signed)
Report given to Michelle at [facility].  °

## 2020-10-09 NOTE — ED Notes (Signed)
Called Safe Transport at [facility].  ETA 10 min

## 2020-10-09 NOTE — ED Notes (Signed)
This RN escorted pt out of ED and to safe transport. Safe transport to take pt to [facility]. This RN gave paper work and pt belongings to [employer].

## 2020-10-09 NOTE — Discharge Instructions (Addendum)
He needs to take antibiotics. I have ordered this for him while at [facility] but if he gets discharged he has a prescription sent.   Follow up with infectious disease about getting started on HIV medications.  Prescription sent to pharmacy for doxycycline.  This is to treat for possible proctitis.  Take medicine as prescribed.
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
## 2020-10-09 NOTE — ED Provider Notes (Addendum)
[facility]   CSN: [id number] Arrival date & time: 10/09/20  [id number]     History Chief Complaint  Patient presents with  . Suicidal  . Emesis  . Diarrhea     Lee Bridges is a 30 y.o. male with past medical history significant for HIV.  HPI Patient presents to emergency department today with chief complaint of suicidal ideations x3 days.  Patient initially presented to behavioral health urgent care and was sent to the ER for medical clearance.  Patient admits to being diagnosed with HIV in 2016 however never started taking his medications.  He states he went to 1 appointment infectious disease however never went back because he could not afford to pay for his medications.  Presents with chief complaint of suicidal ideations as well as nausea and vomiting.  Patient states he has been unable to tolerate much p.o. intake over the last week because of persistent nausea and vomiting.  He also admits to having diarrhea daily over the last week.  He admits to 1 episode of nonbloody stool in the last 24 hours. He admits to having anal intercourse with males, although has not in several months. He denies any penile discharge, rash, sores.He denies any recent travel or antibiotic use.  Patient states his suicidal ideation started this morning.  He had thoughts about wanting to kill himself although did not have a plan at this time.  Patient states he has a history of previous suicide attempt x3 months ago by putting a garbage bag over his head and time it around his neck.  Patient states he has not been sleeping for the last 3 days.  Patient admits to having intermittent homicidal ideations towards his family members. He is unsure why exactly. He admits to using drugs 2 days ago. Does not drink alcohol.     Past Medical History:  Diagnosis Date  . HIV (human immunodeficiency virus infection) (HCC)     There are no problems to display for this  patient.   History reviewed. No pertinent surgical history.     History reviewed. No pertinent family history.  Social History   Tobacco Use  . Smoking status: Current Every Day Smoker    Packs/day: 0.50    Types: Cigarettes  . Smokeless tobacco: Never Used  Substance Use Topics  . Alcohol use: Yes  . Drug use: No    Home Medications Prior to Admission medications   Medication Sig Start Date End Date Taking? Authorizing Provider  doxycycline (VIBRAMYCIN) 100 MG capsule Take 1 capsule (100 mg total) by mouth 2 (two) times daily for 7 days. 10/09/20 10/16/20 Yes Walisiewicz, Dannette Kinkaid E, PA-C    Allergies    Fish-derived products  Review of Systems   Review of Systems All other systems are reviewed and are negative for acute change except as noted in the HPI.  Physical Exam Updated Vital Signs BP 106/72 (BP Location: Left Arm)   Pulse 83   Temp 98.7 F (37.1 C) (Oral)   Resp 16   Ht 5\' 2"  (1.575 m)   Wt 59.9 kg   SpO2 100%   BMI 24.14 kg/m   Physical Exam Vitals and nursing note reviewed.  Constitutional:      General: He is not in acute distress.    Appearance: He is not ill-appearing.  HENT:     Head: Normocephalic and atraumatic.     Right Ear: Tympanic membrane and external ear normal.  Left Ear: Tympanic membrane and external ear normal.     Nose: Nose normal.     Mouth/Throat:     Mouth: Mucous membranes are moist.     Pharynx: Oropharynx is clear.  Eyes:     General: No scleral icterus.       Right eye: No discharge.        Left eye: No discharge.     Extraocular Movements: Extraocular movements intact.     Conjunctiva/sclera: Conjunctivae normal.     Pupils: Pupils are equal, round, and reactive to light.  Neck:     Vascular: No JVD.  Cardiovascular:     Rate and Rhythm: Normal rate and regular rhythm.     Pulses: Normal pulses.          Radial pulses are 2+ on the right side and 2+ on the left side.     Heart sounds: Normal heart sounds.   Pulmonary:     Comments: Lungs clear to auscultation in all fields. Symmetric chest rise. No wheezing, rales, or rhonchi. Abdominal:     Comments: Abdomen is soft, non-distended.  Mild diffuse tenderness. no rigidity, no guarding. No peritoneal signs.  Musculoskeletal:        General: Normal range of motion.     Cervical back: Normal range of motion.  Skin:    General: Skin is warm and dry.     Capillary Refill: Capillary refill takes less than 2 seconds.  Neurological:     Mental Status: He is oriented to person, place, and time.     GCS: GCS eye subscore is 4. GCS verbal subscore is 5. GCS motor subscore is 6.     Comments: Fluent speech, no facial droop.  Psychiatric:        Behavior: Behavior normal.     ED Results / Procedures / Treatments   Labs (all labs ordered are listed, but only abnormal results are displayed) Labs Reviewed  COMPREHENSIVE METABOLIC PANEL - Abnormal; Notable for the following components:      Result Value   Calcium 8.8 (*)    Albumin 3.3 (*)    All other components within normal limits  SALICYLATE LEVEL - Abnormal; Notable for the following components:   Salicylate Lvl <7.0 (*)    All other components within normal limits  ACETAMINOPHEN LEVEL - Abnormal; Notable for the following components:   Acetaminophen (Tylenol), Serum <10 (*)    All other components within normal limits  CBC - Abnormal; Notable for the following components:   WBC 3.1 (*)    RBC 3.22 (*)    Hemoglobin 11.0 (*)    HCT 33.2 (*)    MCV 103.1 (*)    MCH 34.2 (*)    All other components within normal limits  RAPID URINE DRUG SCREEN, HOSP PERFORMED - Abnormal; Notable for the following components:   Cocaine POSITIVE (*)    Amphetamines POSITIVE (*)    Tetrahydrocannabinol POSITIVE (*)    All other components within normal limits  GASTROINTESTINAL PANEL BY PCR, STOOL (REPLACES STOOL CULTURE)  RESP PANEL BY RT-PCR (FLU A&B, COVID) ARPGX2  ETHANOL     EKG None  Radiology CT ABDOMEN PELVIS WO CONTRAST  Result Date: 10/09/2020 CLINICAL DATA:  Acute, non localized abdominal pain with vomiting and diarrhea. History of HIV. EXAM: CT ABDOMEN AND PELVIS WITHOUT CONTRAST TECHNIQUE: Multidetector CT imaging of the abdomen and pelvis was performed following the standard protocol without IV contrast. COMPARISON:  02/01/2017. FINDINGS: Lower chest:  Unremarkable. Hepatobiliary: No focal liver abnormality is seen. No gallstones, gallbladder wall thickening, or biliary dilatation. Pancreas: Unremarkable. No pancreatic ductal dilatation or surrounding inflammatory changes. Spleen: Normal in size without focal abnormality. Adrenals/Urinary Tract: Adrenal glands are unremarkable. Kidneys are normal, without renal calculi, focal lesion, or hydronephrosis. Bladder is unremarkable. Stomach/Bowel: Unremarkable stomach, small bowel and colon. Possible moderate concentric wall thickening involving the inferior rectum. No evidence of appendicitis. Vascular/Lymphatic: No significant vascular findings are present. No enlarged abdominal or pelvic lymph nodes. Reproductive: Prostate is unremarkable. Other: No abdominal wall hernia or abnormality. No abdominopelvic ascites. Musculoskeletal: Normal appearing bones. IMPRESSION: 1. Possible concentric inferior rectal wall thickening. This could be normal due to lack of distension or reflect proctitis. 2. Otherwise, unremarkable examination. Electronically Signed   By: Beckie Salts M.D.   On: 10/09/2020 15:27   DG Chest 1 View  Result Date: 10/09/2020 CLINICAL DATA:  Vomiting, HIV EXAM: CHEST  1 VIEW COMPARISON:  None. FINDINGS: The heart size and mediastinal contours are within normal limits. Both lungs are clear. The visualized skeletal structures are unremarkable. IMPRESSION: Negative. Electronically Signed   By: Charlett Nose M.D.   On: 10/09/2020 13:30    Procedures Procedures   Medications Ordered in ED Medications   iohexol (OMNIPAQUE) 9 MG/ML oral solution (has no administration in time range)  cefTRIAXone (ROCEPHIN) injection 500 mg (has no administration in time range)  doxycycline (VIBRA-TABS) tablet 100 mg (has no administration in time range)  ondansetron (ZOFRAN-ODT) disintegrating tablet 4 mg (4 mg Oral Given 10/09/20 1210)    ED Course  I have reviewed the triage vital signs and the nursing notes.  Pertinent labs & imaging results that were available during my care of the patient were reviewed by me and considered in my medical decision making (see chart for details).    MDM Rules/Calculators/A&P                          Medical clearance labs ordered in triage.  As patient has had nausea vomiting and diarrhea in the setting of HIV with some abdominal tenderness on exam feel that CT scan is reasonable to rule out acute pathology.  We will also get chest x-ray as patient mention coughing yesterday.  CT scan with p.o. contrast ordered for protocol for IV contrast shortage.  Patient given Zofran here.  He is agreeable with plan of care.  CBC with white count of 3.1, hemoglobin 11, normal platelets.  Alcohol, salicylate and acetaminophen levels are all negative. CMP overall unremarkable.  UDS positive for cocaine, amphetamines, and THC. Patient reported frequent diarrhea so stool PCR test ordered.  Low suspicion for C. difficile as patient has had not been on any antibiotics in the last year. CT abdomen pelvis showsp ossible concentric inferior rectal wall thickening.  Radiologist comments this could be normal due to lack of distension or reflect proctitis.  Patient denies any pain with defecation.  He has sexual history of anal intercourse with males although denies any STI symptoms currently.  Will treat for proctitis with IM Rocephin and 7 days of doxycycline with first dose given here.  Prescription sent to pharmacy for doxycycline.  Patient is tolerating p.o. intake here.  He will need to follow-up  outpatient with infectious disease for HIV management.  I did clarify with Doran Heater NP at [facility] patient is medically cleared if he is covid negative. IF CT AP negative and covid negative she asks to be  sent a secure chat to notify her that he is cleared to return to [facility]. Patient is agreeable with plan of care.    Portions of this note were generated with Scientist, clinical (histocompatibility and immunogenetics). Dictation errors may occur despite best attempts at proofreading.   Final Clinical Impression(s) / ED Diagnoses Final diagnoses:  Nausea vomiting and diarrhea  Suicidal ideations  Homicidal ideation  Proctitis    Rx / DC Orders ED Discharge Orders         Ordered    doxycycline (VIBRAMYCIN) 100 MG capsule  2 times daily        10/09/20 1541           Shanon Ace, PA-C 10/09/20 1546    Shanon Ace, PA-C 10/09/20 1548    Arby Barrette, MD 10/17/20 [phone]

## 2020-10-10 MED ORDER — ESCITALOPRAM OXALATE 10 MG PO TABS
10.0000 mg | ORAL_TABLET | Freq: Every day | ORAL | Status: DC
  Administered 2020-10-10: 10 mg via ORAL
  Filled 2020-10-10: qty 7
  Filled 2020-10-10: qty 1

## 2020-10-10 MED ORDER — ESCITALOPRAM OXALATE 10 MG PO TABS: 10.0000 mg | ORAL_TABLET | Freq: Every day | ORAL | 0 refills | Status: DC

## 2020-10-10 MED ORDER — HYDROXYZINE HCL 25 MG PO TABS: 25.0000 mg | ORAL_TABLET | Freq: Three times a day (TID) | ORAL | 0 refills | Status: DC | PRN

## 2020-10-10 MED ORDER — DOXYCYCLINE HYCLATE 50 MG PO CAPS
100.0000 mg | ORAL_CAPSULE | Freq: Two times a day (BID) | ORAL | Status: DC
  Administered 2020-10-10: 100 mg via ORAL
  Filled 2020-10-10: qty 24

## 2020-10-10 MED ORDER — DICYCLOMINE HCL 10 MG PO CAPS: 10.0000 mg | ORAL_CAPSULE | Freq: Three times a day (TID) | ORAL | 0 refills | Status: DC

## 2020-10-10 MED ORDER — DICYCLOMINE HCL 10 MG PO CAPS
10.0000 mg | ORAL_CAPSULE | Freq: Three times a day (TID) | ORAL | Status: DC
  Filled 2020-10-10 (×2): qty 21

## 2020-10-10 MED ORDER — TRAZODONE HCL 50 MG PO TABS: 50.0000 mg | ORAL_TABLET | Freq: Every evening | ORAL | 0 refills | Status: DC | PRN

## 2020-10-10 MED ORDER — ONDANSETRON 4 MG PO TBDP
4.0000 mg | ORAL_TABLET | Freq: Once | ORAL | Status: AC
  Administered 2020-10-10: 4 mg via ORAL
  Filled 2020-10-10: qty 1

## 2020-10-10 NOTE — ED Notes (Signed)
Pt currently in bed sleep, breathing even and unlabored, environment check complete will continue to monitor

## 2020-10-10 NOTE — ED Notes (Signed)
Discharge instructions provided and Pt stated understanding. Personal belongings returned from locker. Samples provided. Pt alert, orient and ambulatory. Pt escorted to the [place] for safe transport services. Safety maintained.

## 2020-10-10 NOTE — ED Notes (Signed)
Safe transport called for services to hotel per Pt's request. Safety maintained and will continue to monitor.

## 2020-10-10 NOTE — Discharge Instructions (Addendum)

## 2020-10-10 NOTE — ED Provider Notes (Signed)
[facility]AP Discharge Summary  Date and Time: 10/10/2020 8:26 AM  Name: Lee Bridges  MRN:  [id number]   Discharge Diagnoses:  Final diagnoses:  Substance induced mood disorder (HCC)  Methamphetamine use disorder, moderate (HCC)  Cocaine use disorder, severe, dependence (HCC)    Subjective: Patient reports today that he is doing better.  Patient denies any suicidal or homicidal ideations and denies any hallucinations.  Patient reports that he does want to go to substance abuse treatment.  Social work reported that there were available beds at [facility] in [place].  Patient was informed of this and then states that he has to go to his hotel room to get his belongings and that he can go without them.  Patient was informed that we cannot provide him transportation to 2 different locations.  Patient then states that he would like to just discharge home and then have resources for him to follow-up with for substance abuse treatment.  Stay Summary: Patient is a 30 year old male with a history of anxiety, depression, meth abuse who presented to the [facility] due to worsening depression and anxiety and was transferred from the ED after medical clearance due to nausea vomiting diarrhea and untreated HIV.  Patient reported having suicidal and homicidal thoughts after his medical clearance and reported that he was willing to go to substance abuse treatment.  He had reported methamphetamine use every other day and cocaine use daily.  Patient's UDS was positive for amphetamines, cocaine, and THC.  Patient was admitted to the continuous observation for overnight assessment and started on medications.  Today the patient had denied any suicidal or homicidal ideations and denied any hallucinations but is continuing to request substance abuse treatment.  Patient was set to go to detox and patient then refused because he had to go to his hotel to get his belongings.  Patient then requested to be discharged to his hotel  room.  Patient was provided with numerous outpatient resources including open access at the [facility].  Patient was provided with 30-day prescriptions of his medications as well as 7 day samples.  Medications were represcribed to pharmacy of choice  Total Time spent with patient: 30 minutes  Past Psychiatric History: anxiety, depression, [polysubstance absue Past Medical History:  Past Medical History:  Diagnosis Date  . HIV (human immunodeficiency virus infection) (HCC)    No past surgical history on file. Family History: No family history on file. Family Psychiatric History: None reported Social History:  Social History   Substance and Sexual Activity  Alcohol Use Yes     Social History   Substance and Sexual Activity  Drug Use No    Social History   Socioeconomic History  . Marital status: Single    Spouse name: Not on file  . Number of children: Not on file  . Years of education: Not on file  . Highest education level: Not on file  Occupational History  . Not on file  Tobacco Use  . Smoking status: Current Every Day Smoker    Packs/day: 0.50    Types: Cigarettes  . Smokeless tobacco: Never Used  Substance and Sexual Activity  . Alcohol use: Yes  . Drug use: No  . Sexual activity: Not on file  Other Topics Concern  . Not on file  Social History Narrative  . Not on file   Social Determinants of Health   Financial Resource Strain: Not on file  Food Insecurity: Not on file  Transportation Needs: Not  on file  Physical Activity: Not on file  Stress: Not on file  Social Connections: Not on file   SDOH:  SDOH Screenings   Alcohol Screen: Not on file  Depression (UGQ9-1): Not on file  Financial Resource Strain: Not on file  Food Insecurity: Not on file  Housing: Not on file  Physical Activity: Not on file  Social Connections: Not on file  Stress: Not on file  Tobacco Use: High Risk  . Smoking Tobacco Use: Current Every Day Smoker  . Smokeless Tobacco Use:  Never Used  Transportation Needs: Not on file    Has this patient used any form of tobacco in the last 30 days? (Cigarettes, Smokeless Tobacco, Cigars, and/or Pipes) A prescription for an FDA-approved tobacco cessation medication was offered at discharge and the patient refused  Current Medications:  Current Facility-Administered Medications  Medication Dose Route Frequency Provider Last Rate Last Admin  . acetaminophen (TYLENOL) tablet 650 mg  650 mg Oral Q6H PRN Nira Conn A, NP      . alum & mag hydroxide-simeth (MAALOX/MYLANTA) 200-200-20 MG/5ML suspension 30 mL  30 mL Oral Q4H PRN Nira Conn A, NP   30 mL at 10/10/20 0813  . dicyclomine (BENTYL) capsule 10 mg  10 mg Oral TID AC Larene Ascencio B, FNP      . doxycycline (VIBRA-TABS) tablet 100 mg  100 mg Oral Q12H Berry, Jason A, NP      . escitalopram (LEXAPRO) tablet 10 mg  10 mg Oral Daily Nhia Heaphy, Gerlene Burdock, FNP      . hydrOXYzine (ATARAX/VISTARIL) tablet 25 mg  25 mg Oral TID PRN Jackelyn Poling, NP   25 mg at 10/09/20 2234  . magnesium hydroxide (MILK OF MAGNESIA) suspension 30 mL  30 mL Oral Daily PRN Nira Conn A, NP      . traZODone (DESYREL) tablet 50 mg  50 mg Oral QHS PRN Jackelyn Poling, NP   50 mg at 10/09/20 2234   Current Outpatient Medications  Medication Sig Dispense Refill  . dicyclomine (BENTYL) 10 MG capsule Take 1 capsule (10 mg total) by mouth 3 (three) times daily before meals. 90 capsule 0  . doxycycline (VIBRAMYCIN) 100 MG capsule Take 1 capsule (100 mg total) by mouth 2 (two) times daily for 7 days. 14 capsule 0  . escitalopram (LEXAPRO) 10 MG tablet Take 1 tablet (10 mg total) by mouth daily. 30 tablet 0  . hydrOXYzine (ATARAX/VISTARIL) 25 MG tablet Take 1 tablet (25 mg total) by mouth 3 (three) times daily as needed for anxiety. 30 tablet 0  . traZODone (DESYREL) 50 MG tablet Take 1 tablet (50 mg total) by mouth at bedtime as needed for sleep. 30 tablet 0    PTA Medications: (Not in a hospital  admission)   Musculoskeletal  Strength & Muscle Tone: within normal limits Gait & Station: normal Patient leans: N/A  Psychiatric Specialty Exam  Presentation  General Appearance: Appropriate for Environment; Casual  Eye Contact:Good  Speech:Clear and Coherent; Normal Rate  Speech Volume:Normal  Handedness:Right   Mood and Affect  Mood:Depressed  Affect:Appropriate; Congruent   Thought Process  Thought Processes:Coherent  Descriptions of Associations:Intact  Orientation:Full (Time, Place and Person)  Thought Content:WDL  Diagnosis of Schizophrenia or Schizoaffective disorder in past: No    Hallucinations:Hallucinations: None Description of Auditory Hallucinations: hears voices that he cannot understand Description of Visual Hallucinations: sees people  Ideas of Reference:None  Suicidal Thoughts:Suicidal Thoughts: No SI Active Intent and/or Plan: Without Intent;  Without Plan  Homicidal Thoughts:Homicidal Thoughts: No HI Active Intent and/or Plan: Without Intent; Without Plan   Sensorium  Memory:Immediate Good; Recent Good; Remote Good  Judgment:Intact  Insight:Fair   Executive Functions  Concentration:Good  Attention Span:Good  Recall:Good  Fund of Knowledge:Good  Language:Good   Psychomotor Activity  Psychomotor Activity:Psychomotor Activity: Normal   Assets  Assets:Communication Skills; Desire for Improvement; Housing; Social Support; Physical Health   Sleep  Sleep:Sleep: Good   Nutritional Assessment (For OBS and [facility] admissions only) Has the patient had a weight loss or gain of 10 pounds or more in the last 3 months?: No Has the patient had a decrease in food intake/or appetite?: Yes Does the patient have dental problems?: No Does the patient have eating habits or behaviors that may be indicators of an eating disorder including binging or inducing vomiting?: No Has the patient recently lost weight without trying?: Yes, 2-13  lbs. Has the patient been eating poorly because of a decreased appetite?: Yes Malnutrition Screening Tool Score: 2    Physical Exam  Physical Exam Vitals and nursing note reviewed.  Constitutional:      Appearance: He is well-developed.  HENT:     Head: Normocephalic.  Eyes:     Pupils: Pupils are equal, round, and reactive to light.  Cardiovascular:     Rate and Rhythm: Normal rate.  Pulmonary:     Effort: Pulmonary effort is normal.  Musculoskeletal:        General: Normal range of motion.  Neurological:     Mental Status: He is alert and oriented to person, place, and time.    Review of Systems  Constitutional: Negative.   HENT: Negative.   Eyes: Negative.   Respiratory: Negative.   Cardiovascular: Negative.   Gastrointestinal: Negative.   Genitourinary: Negative.   Musculoskeletal: Negative.   Skin: Negative.   Neurological: Negative.   Endo/Heme/Allergies: Negative.   Psychiatric/Behavioral: Positive for substance abuse.   Blood pressure 119/86, pulse 87, temperature 97.7 F (36.5 C), temperature source Tympanic, resp. rate 16, SpO2 100 %. There is no height or weight on file to calculate BMI.  Demographic Factors:  Male, Low socioeconomic status and Living alone  Loss Factors: NA  Historical Factors: NA  Risk Reduction Factors:   Positive social support  Continued Clinical Symptoms:  Alcohol/Substance Abuse/Dependencies Previous Psychiatric Diagnoses and Treatments  Cognitive Features That Contribute To Risk:  None    Suicide Risk:  Mild:  Suicidal ideation of limited frequency, intensity, duration, and specificity.  There are no identifiable plans, no associated intent, mild dysphoria and related symptoms, good self-control (both objective and subjective assessment), few other risk factors, and identifiable protective factors, including available and accessible social support.  Plan Of Care/Follow-up recommendations:  Continue activity as  tolerated. Continue diet as recommended by your PCP. Ensure to keep all appointments with outpatient providers.  Disposition: Discharge home  Maryfrances Bunnell, FNP 10/10/2020, 8:26 AM

## 2020-10-10 NOTE — ED Notes (Addendum)
Pt admitted to continuous assessment due to SI/HI. Pt A&O x4, calm and cooperative. Pt tolerated skin assessment well. Pt ambulated independently to unit. Oriented to unit/staff. No signs of acute distress noted. Will continue to monitor for safety.

## 2020-10-10 NOTE — ED Notes (Signed)
GIVEN BREAKFAST  

## 2021-01-25 ENCOUNTER — Emergency Department (HOSPITAL_COMMUNITY)
Admission: EM | Admit: 2021-01-25 | Discharge: 2021-01-26 | Disposition: A | Discharge status: Home / Self Care | Payer: Self-pay | Attending: Emergency Medicine | Admitting: Emergency Medicine

## 2021-01-25 ENCOUNTER — Other Ambulatory Visit: Payer: Self-pay

## 2021-01-25 ENCOUNTER — Encounter (HOSPITAL_COMMUNITY): Payer: Self-pay

## 2021-01-25 DIAGNOSIS — R45851 Suicidal ideations: Secondary | ICD-10-CM | POA: Insufficient documentation

## 2021-01-25 DIAGNOSIS — F1290 Cannabis use, unspecified, uncomplicated: Secondary | ICD-10-CM | POA: Insufficient documentation

## 2021-01-25 DIAGNOSIS — Y900 Blood alcohol level of less than 20 mg/100 ml: Secondary | ICD-10-CM | POA: Insufficient documentation

## 2021-01-25 DIAGNOSIS — R4589 Other symptoms and signs involving emotional state: Secondary | ICD-10-CM

## 2021-01-25 DIAGNOSIS — F333 Major depressive disorder, recurrent, severe with psychotic symptoms: Secondary | ICD-10-CM

## 2021-01-25 DIAGNOSIS — F1990 Other psychoactive substance use, unspecified, uncomplicated: Secondary | ICD-10-CM | POA: Insufficient documentation

## 2021-01-25 DIAGNOSIS — Z21 Asymptomatic human immunodeficiency virus [HIV] infection status: Secondary | ICD-10-CM | POA: Insufficient documentation

## 2021-01-25 DIAGNOSIS — F323 Major depressive disorder, single episode, severe with psychotic features: Secondary | ICD-10-CM | POA: Insufficient documentation

## 2021-01-25 DIAGNOSIS — R21 Rash and other nonspecific skin eruption: Secondary | ICD-10-CM | POA: Insufficient documentation

## 2021-01-25 DIAGNOSIS — F17210 Nicotine dependence, cigarettes, uncomplicated: Secondary | ICD-10-CM | POA: Insufficient documentation

## 2021-01-25 DIAGNOSIS — F1490 Cocaine use, unspecified, uncomplicated: Secondary | ICD-10-CM | POA: Insufficient documentation

## 2021-01-25 DIAGNOSIS — Z20822 Contact with and (suspected) exposure to covid-19: Secondary | ICD-10-CM | POA: Insufficient documentation

## 2021-01-25 LAB — CBC WITH DIFFERENTIAL/PLATELET
Abs Immature Granulocytes: 0.02 10*3/uL (ref 0.00–0.07)
Basophils Absolute: 0 10*3/uL (ref 0.0–0.1)
Basophils Relative: 0 %
Eosinophils Absolute: 0.3 10*3/uL (ref 0.0–0.5)
Eosinophils Relative: 6 %
HCT: 38 % — ABNORMAL LOW (ref 39.0–52.0)
Hemoglobin: 12.6 g/dL — ABNORMAL LOW (ref 13.0–17.0)
Immature Granulocytes: 0 %
Lymphocytes Relative: 47 %
Lymphs Abs: 2.4 10*3/uL (ref 0.7–4.0)
MCH: 33.5 pg (ref 26.0–34.0)
MCHC: 33.2 g/dL (ref 30.0–36.0)
MCV: 101.1 fL — ABNORMAL HIGH (ref 80.0–100.0)
Monocytes Absolute: 0.9 10*3/uL (ref 0.1–1.0)
Monocytes Relative: 17 %
Neutro Abs: 1.6 10*3/uL — ABNORMAL LOW (ref 1.7–7.7)
Neutrophils Relative %: 30 %
Platelets: 261 10*3/uL (ref 150–400)
RBC: 3.76 MIL/uL — ABNORMAL LOW (ref 4.22–5.81)
RDW: 12.7 % (ref 11.5–15.5)
WBC: 5.1 10*3/uL (ref 4.0–10.5)
nRBC: 0 % (ref 0.0–0.2)

## 2021-01-25 LAB — COMPREHENSIVE METABOLIC PANEL
ALT: 16 U/L (ref 0–44)
AST: 27 U/L (ref 15–41)
Albumin: 4.5 g/dL (ref 3.5–5.0)
Alkaline Phosphatase: 53 U/L (ref 38–126)
Anion gap: 10 (ref 5–15)
BUN: 17 mg/dL (ref 6–20)
CO2: 25 mmol/L (ref 22–32)
Calcium: 10 mg/dL (ref 8.9–10.3)
Chloride: 106 mmol/L (ref 98–111)
Creatinine, Ser: 1.3 mg/dL — ABNORMAL HIGH (ref 0.61–1.24)
GFR, Estimated: >60 mL/min (ref >60)
Glucose, Bld: 95 mg/dL (ref 70–99)
Potassium: 3.6 mmol/L (ref 3.5–5.1)
Sodium: 141 mmol/L (ref 135–145)
Total Bilirubin: 1 mg/dL (ref 0.3–1.2)
Total Protein: 9.6 g/dL — ABNORMAL HIGH (ref 6.5–8.1)

## 2021-01-25 LAB — RESP PANEL BY RT-PCR (FLU A&B, COVID) ARPGX2
Influenza A by PCR: NEGATIVE
Influenza B by PCR: NEGATIVE
SARS Coronavirus 2 by RT PCR: NEGATIVE

## 2021-01-25 LAB — RAPID URINE DRUG SCREEN, HOSP PERFORMED
Amphetamines: POSITIVE — AB
Barbiturates: NOT DETECTED
Benzodiazepines: NOT DETECTED
Cocaine: POSITIVE — AB
Opiates: NOT DETECTED
Tetrahydrocannabinol: POSITIVE — AB

## 2021-01-25 LAB — ETHANOL: Alcohol, Ethyl (B): <10 mg/dL (ref <10)

## 2021-01-25 LAB — ACETAMINOPHEN LEVEL: Acetaminophen (Tylenol), Serum: <10 ug/mL — ABNORMAL LOW (ref 10–30)

## 2021-01-25 LAB — SALICYLATE LEVEL: Salicylate Lvl: <7 mg/dL — ABNORMAL LOW (ref 7.0–30.0)

## 2021-01-25 MED ORDER — DICYCLOMINE HCL 10 MG PO CAPS
10.0000 mg | ORAL_CAPSULE | Freq: Three times a day (TID) | ORAL | Status: DC
  Administered 2021-01-25 – 2021-01-26 (×2): 10 mg via ORAL
  Filled 2021-01-25 (×2): qty 1

## 2021-01-25 MED ORDER — HYDROXYZINE HCL 25 MG PO TABS
25.0000 mg | ORAL_TABLET | Freq: Once | ORAL | Status: AC
  Administered 2021-01-25: 25 mg via ORAL
  Filled 2021-01-25: qty 1

## 2021-01-25 MED ORDER — TRIAMCINOLONE ACETONIDE 0.1 % EX CREA
TOPICAL_CREAM | Freq: Two times a day (BID) | CUTANEOUS | Status: DC
  Filled 2021-01-25: qty 15

## 2021-01-25 MED ORDER — ZIPRASIDONE MESYLATE 20 MG IM SOLR: 20.0000 mg | Freq: Once | INTRAMUSCULAR | Status: DC

## 2021-01-25 MED ORDER — ESCITALOPRAM OXALATE 10 MG PO TABS
10.0000 mg | ORAL_TABLET | Freq: Every day | ORAL | Status: DC
  Administered 2021-01-25 – 2021-01-26 (×2): 10 mg via ORAL
  Filled 2021-01-25 (×2): qty 1

## 2021-01-25 MED ORDER — ZOLPIDEM TARTRATE 5 MG PO TABS: 5.0000 mg | ORAL_TABLET | Freq: Every evening | ORAL | Status: DC | PRN

## 2021-01-25 MED ORDER — ALUM & MAG HYDROXIDE-SIMETH 200-200-20 MG/5ML PO SUSP: 30.0000 mL | Freq: Four times a day (QID) | ORAL | Status: DC | PRN

## 2021-01-25 MED ORDER — HYDROXYZINE HCL 25 MG PO TABS: 25.0000 mg | ORAL_TABLET | Freq: Three times a day (TID) | ORAL | Status: DC | PRN

## 2021-01-25 MED ORDER — NICOTINE 21 MG/24HR TD PT24
21.0000 mg | MEDICATED_PATCH | Freq: Every day | TRANSDERMAL | Status: DC
  Administered 2021-01-25 – 2021-01-26 (×2): 21 mg via TRANSDERMAL
  Filled 2021-01-25 (×2): qty 1

## 2021-01-25 MED ORDER — TRAZODONE HCL 100 MG PO TABS: 50.0000 mg | ORAL_TABLET | Freq: Every evening | ORAL | Status: DC | PRN

## 2021-01-25 MED ORDER — ONDANSETRON HCL 4 MG PO TABS: 4.0000 mg | ORAL_TABLET | Freq: Three times a day (TID) | ORAL | Status: DC | PRN
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
## 2021-01-25 NOTE — BHH Counselor (Signed)
Requested cart. 

## 2021-01-25 NOTE — ED Notes (Signed)
TTS at bedside. 
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
## 2021-01-25 NOTE — ED Provider Notes (Signed)
I personally evaluated the patient during the encounter and completed a history, physical, procedures, medical decision making to contribute to the overall care of the patient and decision making for the patient briefly, the patient is a 30 y.o. male with suicidal ideation under IVC with police.  History of HIV not on medications.  Denies any fevers or chills but has had decreased energy recently.  He has rash to his arms and neck.  Could be scabies could be monkey pox could be poison ivy.  Patient suicidal.  First exam filled out.  Medical clearance labs ordered.  Will test from monkey pox.  This chart was dictated using voice recognition software.  Despite best efforts to proofread,  errors can occur which can change the documentation meaning.    EKG Interpretation None            Virgina Norfolk, DO 01/25/21 1508

## 2021-01-25 NOTE — ED Provider Notes (Signed)
Care assumed from [facility], [place], at shift change, please see their notes for full documentation of patient's complaint/HPI. Briefly, pt here with complaints of suicidal ideation. Results so far show slightly elevated kidney function with creatinine 1.30, remainder of workup reassuring. Awaiting TTS eval. Plan is to dispo accordingly. Pt does have a rash; question monkey pox. Test pending.   Physical Exam  BP 130/86 (BP Location: Left Arm)   Pulse 79   Temp 98.4 F (36.9 C) (Oral)   Resp 16   SpO2 99%   Physical Exam Vitals and nursing note reviewed.  Constitutional:      Appearance: He is not ill-appearing.  HENT:     Head: Normocephalic and atraumatic.  Eyes:     Conjunctiva/sclera: Conjunctivae normal.  Cardiovascular:     Rate and Rhythm: Normal rate and regular rhythm.  Pulmonary:     Effort: Pulmonary effort is normal.     Breath sounds: Normal breath sounds.  Skin:    General: Skin is warm and dry.     Coloration: Skin is not jaundiced.  Neurological:     Mental Status: He is alert.    ED Course/Procedures     Procedures  MDM  TTS has evaluated patient; recommends overnight observation and reassessment in the AM.  Care signed out to default provider in the AM. If patient has been psychiatrically cleared he will need instructions on self isolation pending monkey pox testing. Question if he requires inpatient admission if he can be medically cleared pending monkey pox testing?       Tanda Rockers, PA-C 01/25/21 2318    Pollyann Savoy, MD 01/26/21 [phone]
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
## 2021-01-26 ENCOUNTER — Other Ambulatory Visit: Payer: Self-pay

## 2021-01-26 ENCOUNTER — Ambulatory Visit (HOSPITAL_COMMUNITY)
Admission: EM | Admit: 2021-01-26 | Discharge: 2021-01-26 | Disposition: A | Discharge status: Home / Self Care | Payer: No Payment, Other | Attending: Psychiatry | Admitting: Psychiatry

## 2021-01-26 DIAGNOSIS — Z5900 Homelessness unspecified: Secondary | ICD-10-CM | POA: Insufficient documentation

## 2021-01-26 DIAGNOSIS — Z21 Asymptomatic human immunodeficiency virus [HIV] infection status: Secondary | ICD-10-CM | POA: Insufficient documentation

## 2021-01-26 DIAGNOSIS — Z765 Malingerer [conscious simulation]: Secondary | ICD-10-CM | POA: Insufficient documentation

## 2021-01-26 DIAGNOSIS — R45851 Suicidal ideations: Secondary | ICD-10-CM | POA: Insufficient documentation

## 2021-01-26 DIAGNOSIS — F1990 Other psychoactive substance use, unspecified, uncomplicated: Secondary | ICD-10-CM | POA: Insufficient documentation

## 2021-01-26 LAB — RPR
RPR Ser Ql: REACTIVE — AB
RPR Titer: >16

## 2021-01-26 MED ORDER — ESCITALOPRAM OXALATE 10 MG PO TABS: 10.0000 mg | ORAL_TABLET | Freq: Every day | ORAL | 0 refills | Status: DC

## 2021-01-26 MED ORDER — ACETAMINOPHEN 325 MG PO TABS
650.0000 mg | ORAL_TABLET | Freq: Four times a day (QID) | ORAL | Status: DC | PRN
  Administered 2021-01-26: 650 mg via ORAL
  Filled 2021-01-26: qty 2

## 2021-01-26 MED ORDER — IBUPROFEN 200 MG PO TABS
600.0000 mg | ORAL_TABLET | Freq: Three times a day (TID) | ORAL | Status: DC | PRN
Start: 2021-01-26 — End: 2021-01-26

## 2021-01-26 MED ORDER — HYDROXYZINE HCL 25 MG PO TABS
25.0000 mg | ORAL_TABLET | Freq: Three times a day (TID) | ORAL | Status: DC | PRN
  Administered 2021-01-26: 25 mg via ORAL
  Filled 2021-01-26: qty 1

## 2021-01-26 MED ORDER — HYDROXYZINE HCL 25 MG PO TABS: 25.0000 mg | ORAL_TABLET | Freq: Three times a day (TID) | ORAL | 0 refills | Status: DC | PRN

## 2021-01-26 MED ORDER — TRAZODONE HCL 50 MG PO TABS: 50.0000 mg | ORAL_TABLET | Freq: Every evening | ORAL | 0 refills | Status: DC | PRN

## 2021-01-26 MED ORDER — DICYCLOMINE HCL 10 MG PO CAPS: 10.0000 mg | ORAL_CAPSULE | Freq: Three times a day (TID) | ORAL | 0 refills | Status: DC | PRN
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
## 2021-01-26 NOTE — Progress Notes (Signed)
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 Pt stated he has been staying on the street but would not say for how long. Pt stated psych was to find him a place to stay. CSW informed pt that he was phys Cleared and up for d/c. CSW tried to get family or any collateral information pt refused and became agitated. CSW provided shelter resources , offered transportation and told pt about the [facility]. Pt refused all available resources and stated "just send me out so I can sleep under the bridge".   Valentina Shaggy.Lanina Larranaga, MSW, LCSWA [facility]  Transitions of Care Clinical Social Worker I Direct Dial: [phone]  Fax: [phone] [email or web address]

## 2021-01-26 NOTE — BHH Counselor (Addendum)
Patient called the [facility] (1:45pm)  expressing he was discharged from [facility] with active suicidal ideations. Patient reported he's also hearing voices. Patient reported he was suicidal with a plan to jump off a bridge. TTS instructed the patient come to [facility] or return to the [facility]. Patient expressed no one cares for him and no one cares that he's suicidal with a plan and hearing voices. Patient abruptly hung up the phone.

## 2021-01-26 NOTE — BH Assessment (Addendum)
Lee Bridges is urgent, SI no plan, No HI, AVH. Pt discharged today from [facility]. Reports several stressors did not want to disclose during triage. Reports depression for past year. Reports yesterday he was planning to jump off a bridge. Status changed to routine

## 2021-01-26 NOTE — ED Notes (Addendum)
Pt called out upset, stating "Nobody checked on me all night, nobody gave me any food, I'm leaving. I'm in pain and this damn cream Jasaun't even workField seismologist went into room to redirect the pt. Pt calmed down and return to bed. RN advised pt I would ask the MD for pain medication and administer another medication for itching. Pt verbalized understanding.

## 2021-01-26 NOTE — BH Assessment (Signed)
BHH Assessment Progress Note   Per Dorena Bodo, NP, this pt does not require psychiatric hospitalization at this time.  Pt presents under IVC initiated by law enforcement and upheld by EDP Virgina Norfolk, MD, which has been rescinded by Nelly Rout, MD.  Pt is psychiatrically cleared.  Discharge instructions include referral information for [facility] and for [facility] [place].  EDP Linwood Dibbles, MD and pt's nurse, Garald Balding, have been notified.  Doylene Canning, MA Triage Specialist [phone]

## 2021-01-26 NOTE — Discharge Instructions (Signed)
Substance Abuse Resources  [facility] - Admissions are currently completed Monday through Friday at 8am; both appointments and walk-ins are accepted.  Any individual that is a [place] resident may present for a substance abuse screening and assessment for admission.  A person may be referred by numerous sources or self-refer.   Potential clients will be screened for medical necessity and appropriateness for the program.  Clients must meet criteria for high-intensity residential treatment services.  If clinically appropriate, a client will continue with the comprehensive clinical assessment and intake process, as well as enrollment in the [facility].   Address: [address] [place], [place] [address] Admin Hours: Mon-Fri 8AM to 5PM Center Hours: 24/7 Phone: [phone] Fax: [phone]   [facility]) Facility Based Crisis:  These are 3 locations for services: Please call before arrival    Address: [address] [place], [place] [address] Phone: [phone]   Address: [address], [place], [place] [address] Phone#: [phone]   Address: [address], [place], [place] [address] Phone#: [phone]     [facility]): (offers outpatient therapy and intensive outpatient substance abuse therapy).  [address], [place], [place] [address] Phone: [phone]   [facility]: Offers FREE recovery skills classes, support groups, 1:1 Peer Support, and Compeer Classes. [address], [place], [place] [address] Phone: [phone] (Call to complete intake).  [facility] [address] [place], [place] [address] Phone: [phone] ext: [phone] [facility] provides food, shelter and other programs and services to the homeless men of [place] through our men's program.   By offering safe shelter, three meals a day,  clean clothing, Biblical counseling, financial planning, vocational training, GED/education and employment assistance, we've helped mend the shattered lives of many homeless men since opening in 1974.   We have approximately 267 beds available, with a max of 312 beds including mats for emergency situations and currently house an average of 270 men a night.   Prospective Client Check-In Information Photo ID Required (State/ Out of State/ DOC) - if photo ID is not available, clients are required to have a printout of a police/sheriff's criminal history report. Help out with chores around the Mission. No sex offender of any type (pending, charged, registered and/or any other sex related offenses) will be permitted to check in. Must be willing to abide by all rules, regulations, and policies established by the [facility]. The following will be provided - shelter, food, clothing, and biblical counseling. If you or someone you know is in need of assistance at our men's shelter in [place], [place], please call [phone] ext. [phone].   [facility]-will provide timely access to mental health services for children and adolescents (4-17) and adults presenting in a mental health crisis. The program is designed for those who need urgent Behavioral Health or Substance Use treatment and are not experiencing a medical crisis that would typically require an emergency room visit.    [address] [place],  [address] Phone: [phone] [email or web address]   [facility]: Phone#: [phone]   The [facility] SA Intensive Outpatient Program (SAIOP) means structured individual and group addiction activities and services that are provided at an outpatient program designed to assist adult and adolescent consumers to begin recovery and learn skills for recovery maintenance. The [facility] [facility] SAIOP program is offered at   least 3 hours a  day, 3 days a week.SAIOP services shall include a structured program consisting of, but not limited to, the following services: Individual counseling and support; Group counseling and support; Family counseling, training or support; Biochemical assays to identify recent drug use (e.g., urine drug screens); Strategies for relapse prevention to include community and social support systems in treatment; Life skills; Crisis contingency planning; Disease Management; and Treatment support activities that have been adapted or specifically designed for persons with physical disabilities, or persons with co-occurring disorders of mental illness and substance abuse/dependence or mental retardation/developmental disability and substance abuse/dependence. Phone: [phone]     [facility] 24-Hour Call Center: 1-800-256-2452  Behavioral Health Crisis Line: 1-833-600-2054       

## 2021-01-26 NOTE — ED Provider Notes (Signed)
Behavioral Health Urgent Care Medical Screening Exam  Patient Name: Lee Bridges MRN: [id number] Date of Evaluation: 01/26/21 Chief Complaint:  Homelessness Diagnosis:  Final diagnoses:  Suicidal ideation  Malingering  Substance use disorder    History of Present illness: Lee Bridges is a 30 y.o. male w/ hx of HIV, anxiety, depression, suicidality, ED discharged today presents to the [facility] UC stating he is feeling "suicidal".  Patient stated that he has been discharged from [facility] ED abruptly without receiving any resources and AVS.  Patient states that he was unable to fill his medications.  Patient states that he was very frustrated by this as he was looking for his substance use rehabilitation center.  Patient's UDS at the ED yesterday was positive for cocaine, amphetamines, and THC.  Patient states that there are many compounding factors that is leading to his suicidality including housing instability as well as medical health specifically his untreated HIV.  After this Clinical research associate discussed patient's needs, patient admits that he is more looking for a rehabilitation service and housing and denies present SI/HI/AVH.  Patient was informed that many of the rehabilitation services will not accept an actively suicidal patient, patient rescinded his suicidal remarks.  Patient provided with resources in AVS and bus pass for transportation.  Psychiatric Specialty Exam  Presentation  General Appearance:Appropriate for Environment  Eye Contact:Good  Speech:Clear and Coherent; Normal Rate  Speech Volume:Normal  Handedness:Right   Mood and Affect  Mood:Euthymic  Affect:Congruent   Thought Process  Thought Processes:Coherent; Goal Directed  Descriptions of Associations:Intact  Orientation:Full (Time, Place and Person)  Thought Content:Logical  Diagnosis of Schizophrenia or Schizoaffective disorder in past: No   Hallucinations:None hears voices that he cannot understand sees  people  Ideas of Reference:None  Suicidal Thoughts:No Without Intent; Without Plan  Homicidal Thoughts:No Without Intent; Without Plan   Sensorium  Memory:Immediate Good; Recent Good; Remote Good  Judgment:Intact  Insight:Good   Executive Functions  Concentration:Good  Attention Span:Good  Recall:Good  Fund of Knowledge:Good  Language:Good   Psychomotor Activity  Psychomotor Activity:Normal   Assets  Assets:Communication Skills; Desire for Improvement; Social Support; Resilience   Sleep  Sleep:Good  Number of hours: 8   No data recorded  Physical Exam: Physical Exam Vitals and nursing note reviewed.  Constitutional:      Appearance: He is well-developed.  HENT:     Head: Normocephalic and atraumatic.  Eyes:     Conjunctiva/sclera: Conjunctivae normal.  Cardiovascular:     Rate and Rhythm: Normal rate and regular rhythm.     Heart sounds: No murmur heard. Pulmonary:     Effort: Pulmonary effort is normal. No respiratory distress.     Breath sounds: Normal breath sounds.  Abdominal:     Palpations: Abdomen is soft.     Tenderness: There is no abdominal tenderness.  Musculoskeletal:     Cervical back: Neck supple.  Skin:    General: Skin is warm and dry.  Neurological:     Mental Status: He is alert.   ROS Blood pressure (!) 139/94, pulse 93, temperature 98.5 F (36.9 C), temperature source Oral, resp. rate 18, SpO2 99 %. There is no height or weight on file to calculate BMI.  Musculoskeletal: Strength & Muscle Tone: within normal limits Gait & Station: normal Patient leans: Front   [facility] MSE Discharge Disposition for Follow up and Recommendations: Based on my evaluation the patient does not appear to have an emergency medical condition and can be discharged with resources and follow  up care in outpatient services for Medication Management and Substance Abuse Intensive Outpatient Program  Pt safe to d/c. Able to contract for safety.  Does not meet inpatient or FBC criteria. Pt provided bus pass and drug rehab services  Park Pope, MD 01/26/2021, 4:35 PM

## 2021-01-26 NOTE — ED Notes (Signed)
When this RN went to discharge pt. Pt became upset stating "nobody has done anything for me" "I'm just being discharged with no where to go" "I should have just jumped off the bridge the first time, but Emet't worry about it, I'm just going to go do the same thing" "I wasn't homicidal or suicidal because I'm on my meds but I Nickalos't have them"  MD made aware of pt's comments. Medication prescriptions were refilled. Pt was given discharge instructions. Pt then walked out of the hospital with a steady gait.

## 2021-01-26 NOTE — ED Provider Notes (Addendum)
Emergency Medicine Observation Re-evaluation Note  Lee Bridges is a 30 y.o. male, seen on rounds today.  Pt initially presented to the ED for complaints of Suicidal (Pt endorsed feeling suicidal for several days without plan or intent.  Pt endorsed despondency and auditory hallucination.) Currently, the patient is waiting inpatient treatment.  Physical Exam  BP 115/87 (BP Location: Left Arm)   Pulse 74   Temp 98.4 F (36.9 C) (Oral)   Resp 16   SpO2 100%  Physical Exam General: Awake alert Cardiac: Regular rate Lungs: Breathing easily Psych: agitated  ED Course / MDM  EKG:   I have reviewed the labs performed to date as well as medications administered while in observation.  Recent changes in the last 24 hours include labs yesterday positive for cocaine amphetamines and marijuana.  Otherwise medically stable.  COVID and flu test are negative.  Patient does have monkey poxvirus pending..  Patient is also complaining of itching and discomfort.  Patient does have hydroxyzine ordered.  We will add on ibuprofen and Tylenol.  Plan  Current plan is for inpatient psychiatric treatment.  Malikai Gut is not under involuntary commitment.     Linwood Dibbles, MD 01/26/21 [phone]  Notified by Dorena Bodo, psychiatry that patient has been cleared for discharge and outpatient management   Linwood Dibbles, MD 01/26/21 1145

## 2021-01-26 NOTE — Progress Notes (Signed)
Attempted to review AVS with patient.  Patient refused.  Patient discharged in stable condition; no acute distress noted.  Bus pass given along with AVS.

## 2021-01-26 NOTE — Discharge Instructions (Addendum)
We did send off a test for the monkey poxvirus.  That will take several days for the results to come back.  Follow-up with a primary care or infectious disease doctor.    For your behavioral health needs you are advised to follow up with one of the following providers at your earliest opportunity:       [facility]      [address]      [place], [place] [address]      [phone]        New patients are seen in their walk-in clinic.  Walk-in hours are Monday - Thursday from 8:00 am - 11:00 am for psychiatry, and Friday from 1:00 pm - 4:00 pm for therapy.  Walk-in patients are seen on a first come, first served basis, so try to arrive as early as possible for the best chance of being seen the same day.       [facility] [place]      [address]      [place], [place] [address]      [phone]      They offer psychiatry/medication management, therapy and substance abuse counseling.  New patients are seen at their walk-in clinic.  Walk-in hours are Monday - Friday from 8:30 am - 12:00 pm, and from 1:00 pm - 2:30 pm.  Walk-in patients are seen on a first come, first served basis, so try to arrive as early as possible for the best chance of being seen the same day.

## 2021-01-27 LAB — MONKEYPOX VIRUS DNA, QUALITATIVE REAL-TIME PCR: Orthopoxvirus DNA, QL PCR: NOT DETECTED

## 2021-01-28 LAB — T.PALLIDUM AB, TOTAL: T Pallidum Abs: REACTIVE — AB

## 2021-03-19 ENCOUNTER — Encounter: Payer: Self-pay | Admitting: Infectious Diseases

## 2021-03-20 ENCOUNTER — Ambulatory Visit (INDEPENDENT_AMBULATORY_CARE_PROVIDER_SITE_OTHER): Payer: Self-pay | Admitting: Infectious Diseases

## 2021-03-20 ENCOUNTER — Encounter: Payer: Self-pay | Admitting: Infectious Diseases

## 2021-03-20 ENCOUNTER — Other Ambulatory Visit: Payer: Self-pay

## 2021-03-20 VITALS — BP 128/83 | HR 85 | Resp 16 | Ht 62.0 in | Wt 116.3 lb

## 2021-03-20 DIAGNOSIS — A539 Syphilis, unspecified: Secondary | ICD-10-CM

## 2021-03-20 DIAGNOSIS — F333 Major depressive disorder, recurrent, severe with psychotic symptoms: Secondary | ICD-10-CM

## 2021-03-20 DIAGNOSIS — Z8619 Personal history of other infectious and parasitic diseases: Secondary | ICD-10-CM | POA: Insufficient documentation

## 2021-03-20 DIAGNOSIS — F1410 Cocaine abuse, uncomplicated: Secondary | ICD-10-CM

## 2021-03-20 DIAGNOSIS — Z113 Encounter for screening for infections with a predominantly sexual mode of transmission: Secondary | ICD-10-CM

## 2021-03-20 DIAGNOSIS — Z23 Encounter for immunization: Secondary | ICD-10-CM

## 2021-03-20 DIAGNOSIS — Z21 Asymptomatic human immunodeficiency virus [HIV] infection status: Secondary | ICD-10-CM

## 2021-03-20 DIAGNOSIS — B20 Human immunodeficiency virus [HIV] disease: Secondary | ICD-10-CM

## 2021-03-20 MED ORDER — PENICILLIN G BENZATHINE 1200000 UNIT/2ML IM SUSY
1.2000 10*6.[IU] | PREFILLED_SYRINGE | Freq: Once | INTRAMUSCULAR | Status: AC
  Administered 2021-03-20: 1.2 10*6.[IU] via INTRAMUSCULAR

## 2021-03-20 MED ORDER — BICTEGRAVIR-EMTRICITAB-TENOFOV 50-200-25 MG PO TABS: 1.0000 | ORAL_TABLET | Freq: Every day | ORAL | 0 refills | Status: DC

## 2021-03-20 MED ORDER — BICTEGRAVIR-EMTRICITAB-TENOFOV 50-200-25 MG PO TABS: 1.0000 | ORAL_TABLET | Freq: Every day | ORAL | 5 refills | Status: DC

## 2021-03-20 NOTE — Assessment & Plan Note (Signed)
New patient here to establish for HIV care.   Mr. Sasso was diagnosed originally in may 2016 and was not ready to accept treatment at that time. He is highly motivated today and ready to start on Biktarvy. he was given 2 weeks of samples at the visit. He will continue working with Marthann Schiller, our Paramedic, to ensure he has resources he needs to be independent and successful on treatment over time. Recently in ER visit his total lymphocyte count was 2200 - I suspect that his CD4 will be preserved. Will check VL with genotype, CD4, RPR, hepatitis serologies, quantiferon gold, STI screening from all sites of exposure.   I discussed with Roseanne Kaufman treatment options/side effects, benefits of treatment and long-term outcomes. I discussed how HIV is transmitted and the process of untreated HIV including increased risk for opportunistic infections, cancer, dementia and renal failure. Patient was counseled on routine HIV care including medication adherence, blood monitoring, necessary vaccines and follow up visits. Counseled regarding safe sex practices including: condom use, partner disclosure, limiting partners. Patient spent time talking with our pharmacist Marchelle Folks regarding successful practices of ART and understands to reach out to our clinic in the future with questions.   Will start BIKTARVY for HIV treatment. He has [place] ADAP/RW application pending. Given 2 weeks of samples today and rx went to specialty [facility]. Discussed interval for re-application today and services provided on [place] HMAP.   General introduction to our clinic and integrated services. Recommended vaccines and office visits discussed. I welcomed him to bring his mother at his comfort for future visits so she can get educated about HIV also.   I spent greater than 45 minutes with the patient today.

## 2021-03-20 NOTE — Progress Notes (Signed)
Name: Lee Bridges  DOB: January 03, 1991 MRN: [id number] PCP: Patient, No Pcp Per (Inactive)    Brief Narrative:  Lee Bridges is a 30 y.o. male with HIV disease, Dx 10/17/2014. Never on medications.  CD4 nadir unknown VL unknown HIV Risk: sexual  History of OIs: none Intake Labs 03/20/21: Hep B sAg (), sAb (), cAb (); Hep A (), Hep C () Quantiferon () HLA B*5701 () G6PD: ()   Previous Regimens: Naive   Genotypes: Pending   Subjective:   CC:  New patient for HIV care.      HPI: [name] is here today for his first visit to get on HIV medication. He was first diagnosed in 2016 and did not accept treatment at that time. He was concerned about financials associated with treatment and not ready to take medications. He was born in [place] and currently lives with his mother after he disclosed to her he has HIV and was likely to be homeless. He has not been in care with regular provider and has been to UC/ER a few times for various problems. More recently in May 2022 had proctitis symptoms treated with doxycycline; depression with acute SI a few times recently and substance use. In late August he described a rash that came up on arms and face he described to be pimple like. He had one lesion tested for orthopox virus which was negative. The rash has since resolved with some hyperpigmented scaring.   Has had problems with diarrhea for which he takes bentyl for. This does seem to help with symptoms.  Has noticed some hair loss - this started 2 years ago; lost hair on his head, beard, eyebrows.  Has some trouble peeing sometimes - feels like he has to "force it." Has some particles that show up in the urine but no discharge or dysuria.  His father has hepatitis and he is concerned he would like to get  Multiple ER visits for suicide behavior and substance abuse (methamphetamines infrequently and cocaine ). Currently living with mother home.    Depression screen PHQ 2/9 03/20/2021  Decreased Interest  0  Down, Depressed, Hopeless 0  PHQ - 2 Score 0  Altered sleeping -  Tired, decreased energy -  Change in appetite -  Feeling bad or failure about yourself  -  Trouble concentrating -  Moving slowly or fidgety/restless -  Suicidal thoughts -  PHQ-9 Score -  Difficult doing work/chores -     Review of Systems  Constitutional:  Negative for chills, fever, malaise/fatigue and weight loss.  HENT:  Negative for sore throat.        No dental problems  Respiratory:  Negative for cough and sputum production.   Cardiovascular:  Negative for chest pain and leg swelling.  Gastrointestinal:  Positive for diarrhea. Negative for abdominal pain and vomiting.  Genitourinary:  Negative for dysuria and flank pain.  Musculoskeletal:  Negative for joint pain, myalgias and neck pain.  Skin:  Negative for rash.  Neurological:  Negative for dizziness, tingling and headaches.  Psychiatric/Behavioral:  Positive for depression, substance abuse (cocaine - interested in quitting) and suicidal ideas. The patient is not nervous/anxious and does not have insomnia.   All other systems reviewed and are negative.  Past Medical History:  Diagnosis Date   HIV (human immunodeficiency virus infection) ([place])     Outpatient Medications Prior to Visit  Medication Sig Dispense Refill   traZODone (DESYREL) 50 MG tablet Take 1 tablet (50 mg total) by  mouth at bedtime as needed for sleep. 30 tablet 0   dicyclomine (BENTYL) 10 MG capsule Take 1 capsule (10 mg total) by mouth 3 (three) times daily as needed. (Patient not taking: Reported on 03/20/2021) 30 capsule 0   escitalopram (LEXAPRO) 10 MG tablet Take 1 tablet (10 mg total) by mouth daily. 30 tablet 0   hydrOXYzine (ATARAX/VISTARIL) 25 MG tablet Take 1 tablet (25 mg total) by mouth 3 (three) times daily as needed for anxiety. 30 tablet 0   No facility-administered medications prior to visit.     Allergies  Allergen Reactions   Fish-Derived Products Hives and  Nausea And Vomiting    Social History   Tobacco Use   Smoking status: Every Day    Packs/day: 0.50    Types: Cigarettes   Smokeless tobacco: Never  Substance Use Topics   Alcohol use: Yes   Drug use: No    Family History  Problem Relation Age of Onset   Arthritis Mother    Diabetes Father    Hypertension Father     Social History   Substance and Sexual Activity  Sexual Activity Not on file     Objective:   Vitals:   03/20/21 0931  BP: 128/83  Pulse: 85  Resp: 16  SpO2: 99%  Weight: 116 lb 4.8 oz (52.8 kg)  Height: 5' 2"  (1.575 m)   Body mass index is 21.27 kg/m.  Physical Exam Vitals reviewed.  Constitutional:      Appearance: He is well-developed.     Comments: Seated comfortably in chair during visit.   HENT:     Mouth/Throat:     Dentition: Normal dentition. No dental abscesses.  Cardiovascular:     Rate and Rhythm: Normal rate and regular rhythm.     Heart sounds: Normal heart sounds.  Pulmonary:     Effort: Pulmonary effort is normal.     Breath sounds: Normal breath sounds.  Abdominal:     General: There is no distension.     Palpations: Abdomen is soft.     Tenderness: There is no abdominal tenderness.  Lymphadenopathy:     Cervical: No cervical adenopathy.  Skin:    General: Skin is warm and dry.     Findings: No rash.  Neurological:     Mental Status: He is alert and oriented to person, place, and time.  Psychiatric:        Judgment: Judgment normal.     Comments: In good spirits today and engaged in care discussion.     Lab Results Lab Results  Component Value Date   WBC 5.1 01/25/2021   HGB 12.6 (L) 01/25/2021   HCT 38.0 (L) 01/25/2021   MCV 101.1 (H) 01/25/2021   PLT 261 01/25/2021    Lab Results  Component Value Date   CREATININE 1.30 (H) 01/25/2021   BUN 17 01/25/2021   NA 141 01/25/2021   K 3.6 01/25/2021   CL 106 01/25/2021   CO2 25 01/25/2021    Lab Results  Component Value Date   ALT 16 01/25/2021   AST 27  01/25/2021   ALKPHOS 53 01/25/2021   BILITOT 1.0 01/25/2021    No results found for: CHOL, HDL, LDLCALC, LDLDIRECT, TRIG, CHOLHDL No results found for: HIV1RNAQUANT, HIV1RNAVL, CD4TABS   Assessment & Plan:   Problem List Items Addressed This Visit       Unprioritized   Syphilis    Approximately 2 years ago he describes an episode of alopecia and  palmar rash with brown spots - this is all c/w secondary syphilis history. Will treat with 3 rounds of weekly bicillin 2.4 million units, first dose given today.       Relevant Medications   bictegravir-emtricitabine-tenofovir AF (BIKTARVY) 50-200-25 MG TABS tablet   bictegravir-emtricitabine-tenofovir AF (BIKTARVY) 50-200-25 MG TABS tablet   Severe episode of recurrent major depressive disorder, with psychotic features ([place])    He is awaiting appt with mental health team - he needs a refill on trazodone, which has been very helpful for him. Refills provided to bridge until appt.       Relevant Medications   traZODone (DESYREL) 50 MG tablet   HIV (human immunodeficiency virus infection) ([place]) - Primary    New patient here to establish for HIV care.   Mr. Cappella was diagnosed originally in may 2016 and was not ready to accept treatment at that time. He is highly motivated today and ready to start on Biktarvy. he was given 2 weeks of samples at the visit. He will continue working with Karn Pickler, our Engineer, materials, to ensure he has resources he needs to be independent and successful on treatment over time. Recently in ER visit his total lymphocyte count was 2200 - I suspect that his CD4 will be preserved. Will check VL with genotype, CD4, RPR, hepatitis serologies, quantiferon gold, STI screening from all sites of exposure.   I discussed with Crawford Givens treatment options/side effects, benefits of treatment and long-term outcomes. I discussed how HIV is transmitted and the process of untreated HIV including increased risk for opportunistic  infections, cancer, dementia and renal failure. Patient was counseled on routine HIV care including medication adherence, blood monitoring, necessary vaccines and follow up visits. Counseled regarding safe sex practices including: condom use, partner disclosure, limiting partners. Patient spent time talking with our pharmacist Estill Bamberg regarding successful practices of ART and understands to reach out to our clinic in the future with questions.   Will start [place] for HIV treatment. He has [place] ADAP/RW application pending. Given 2 weeks of samples today and [facility]. Discussed interval for re-application today and services provided on [place] HMAP.   General introduction to our clinic and integrated services. Recommended vaccines and office visits discussed. I welcomed him to bring his mother at his comfort for future visits so she can get educated about HIV also.   I spent greater than 45 minutes with the patient today.       Relevant Medications   bictegravir-emtricitabine-tenofovir AF (BIKTARVY) 50-200-25 MG TABS tablet   bictegravir-emtricitabine-tenofovir AF (BIKTARVY) 50-200-25 MG TABS tablet   Other Relevant Orders   HIV-1 RNA ultraquant reflex to gentyp+   Hepatitis B surface antigen   Hepatitis B surface antibody,qualitative   Hepatitis A Ab, Total   Hepatitis C antibody   QuantiFERON-TB Gold Plus   T-helper cells (CD4) count (not at [facility])   Cocaine abuse ([place])    Interested in quitting - will work with Sales executive and our team for resources to support him through that.       Other Visit Diagnoses     Routine screening for STI (sexually transmitted infection)       Relevant Orders   RPR   Urine cytology ancillary only   Cytology (oral, anal, urethral) ancillary only   Cytology (oral, anal, urethral) ancillary only   Need for immunization against influenza       Relevant Orders   Flu Vaccine QUAD 75moIM (Fluarix, Fluzone & Alfiuria [employer]  PF) (Completed)   Need for  pneumococcal vaccination       Relevant Orders   Pneumococcal conjugate vaccine 13-valent IM (Completed)       Janene Madeira, MSN, NP-C [facility] [place] Pager: [phone] Office: [phone]  03/23/21  8:55 AM

## 2021-03-20 NOTE — Patient Instructions (Addendum)
Very nice to meet you.   Biktarvy is the pill I would like for you to start taking to treat you - this will need to be taken once a day around the same time.  - Common side effects for a short time frame usually include headaches, nausea and diarrhea - OK to take over the counter tylenol for headaches and imodium for diarrhea - Try taking with food if you are nauseated  - If you take any multivitamins or supplements please separate them from your Biktarvy by 6 hours before and after.  The main thing is do not have them in the stomach at the same time.  Plan to take your Biktarvy at night with your Trazodone dose.   Please schedule 2 more doses (weekly) of your antibiotic shots  Please schedule return visit to see our pharmacy team in 4 weeks.   Please schedule a visit back to see Judeth Cornfield again in January - welcome to bring your mother!  We gave you your flu and first pneumonia vaccine today.

## 2021-03-23 ENCOUNTER — Other Ambulatory Visit: Payer: Self-pay | Admitting: Pharmacist

## 2021-03-23 DIAGNOSIS — F1410 Cocaine abuse, uncomplicated: Secondary | ICD-10-CM | POA: Insufficient documentation

## 2021-03-23 LAB — CYTOLOGY, (ORAL, ANAL, URETHRAL) ANCILLARY ONLY
Chlamydia: NEGATIVE
Chlamydia: NEGATIVE
Comment: NEGATIVE
Comment: NEGATIVE
Comment: NORMAL
Comment: NORMAL
Neisseria Gonorrhea: NEGATIVE
Neisseria Gonorrhea: NEGATIVE

## 2021-03-23 LAB — URINE CYTOLOGY ANCILLARY ONLY
Chlamydia: NEGATIVE
Comment: NEGATIVE
Comment: NORMAL
Neisseria Gonorrhea: NEGATIVE

## 2021-03-23 MED ORDER — TRAZODONE HCL 50 MG PO TABS: 50.0000 mg | ORAL_TABLET | Freq: Every evening | ORAL | 3 refills | Status: DC | PRN

## 2021-03-23 MED ORDER — SULFAMETHOXAZOLE-TRIMETHOPRIM 400-80 MG PO TABS: 1.0000 | ORAL_TABLET | Freq: Every day | ORAL | 5 refills | Status: DC

## 2021-03-23 NOTE — Progress Notes (Signed)
Medication Samples have been provided to the patient.  Drug name: Biktarvy        Strength: 50/200/25 mg       Qty: 1 bottle (7 tablets)   LOT: [id number]   Exp.Date: 03/01/2023  Dosing instructions: Take one tablet by mouth once daily  The patient has been instructed regarding the correct time, dose, and frequency of taking this medication, including desired effects and most common side effects.   Atul Delucia L. Renardo Cheatum, PharmD, BCIDP, AAHIVP, CPP Clinical Pharmacist Practitioner Infectious Diseases Clinical Pharmacist [facility] 05/12/2020, 10:07 AM  

## 2021-03-23 NOTE — Assessment & Plan Note (Signed)
Approximately 2 years ago he describes an episode of alopecia and palmar rash with brown spots - this is all c/w secondary syphilis history. Will treat with 3 rounds of weekly bicillin 2.4 million units, first dose given today.

## 2021-03-23 NOTE — Assessment & Plan Note (Signed)
He is awaiting appt with mental health team - he needs a refill on trazodone, which has been very helpful for him. Refills provided to bridge until appt.

## 2021-03-23 NOTE — Addendum Note (Signed)
Addended by: Blanchard Kelch on: 03/23/2021 02:28 PM   Modules accepted: Orders

## 2021-03-23 NOTE — Assessment & Plan Note (Signed)
Interested in quitting - will work with Licensed conveyancer and our team for resources to support him through that.

## 2021-03-23 NOTE — Progress Notes (Signed)
CD4 < 200 - will start bactrim SS tab QD for 3-20m with known HIV untreated > 5 years and follow closely for reconstitution.

## 2021-03-24 ENCOUNTER — Other Ambulatory Visit (HOSPITAL_COMMUNITY): Payer: Self-pay

## 2021-03-24 ENCOUNTER — Other Ambulatory Visit: Payer: Self-pay | Admitting: Infectious Diseases

## 2021-03-24 DIAGNOSIS — B20 Human immunodeficiency virus [HIV] disease: Secondary | ICD-10-CM

## 2021-03-24 MED ORDER — SULFAMETHOXAZOLE-TRIMETHOPRIM 400-80 MG PO TABS
1.0000 | ORAL_TABLET | Freq: Every day | ORAL | 0 refills | Status: DC
  Filled 2021-03-24: qty 30, 30d supply, fill #0

## 2021-03-25 ENCOUNTER — Other Ambulatory Visit (HOSPITAL_COMMUNITY): Payer: Self-pay

## 2021-03-27 ENCOUNTER — Ambulatory Visit (INDEPENDENT_AMBULATORY_CARE_PROVIDER_SITE_OTHER): Payer: Self-pay

## 2021-03-27 ENCOUNTER — Other Ambulatory Visit: Payer: Self-pay

## 2021-03-27 DIAGNOSIS — A539 Syphilis, unspecified: Secondary | ICD-10-CM

## 2021-03-27 DIAGNOSIS — Z23 Encounter for immunization: Secondary | ICD-10-CM

## 2021-03-27 MED ORDER — PENICILLIN G BENZATHINE 1200000 UNIT/2ML IM SUSY
1.2000 10*6.[IU] | PREFILLED_SYRINGE | Freq: Once | INTRAMUSCULAR | Status: AC
  Administered 2021-03-27: 1.2 10*6.[IU] via INTRAMUSCULAR

## 2021-03-27 NOTE — Progress Notes (Signed)
Reviewed and verified allergies with patient. Patient tolerated Bicillin injections well. Reinforced abstinence until treatment completed plus an additional 10 days, offered condoms and encouraged use. Advised patient to notify sexual partners for testing and treatment. Patient verbalized understanding.   Hildreth Robart D Jeriko Kowalke, RN  

## 2021-03-28 LAB — HEPATITIS B SURFACE ANTIBODY,QUALITATIVE: Hep B S Ab: NONREACTIVE

## 2021-03-28 LAB — RPR TITER: RPR Titer: 1:16 {titer} — ABNORMAL HIGH

## 2021-03-28 LAB — HEPATITIS A ANTIBODY, TOTAL: Hepatitis A AB,Total: NONREACTIVE

## 2021-03-28 LAB — T-HELPER CELLS (CD4) COUNT (NOT AT ARMC)
Absolute CD4: 183 cells/uL — ABNORMAL LOW (ref 490–1740)
CD4 T Helper %: 18 % — ABNORMAL LOW (ref 30–61)
Total lymphocyte count: 992 cells/uL (ref 850–3900)

## 2021-03-28 LAB — FLUORESCENT TREPONEMAL AB(FTA)-IGG-BLD: Fluorescent Treponemal ABS: REACTIVE — AB

## 2021-03-28 LAB — QUANTIFERON-TB GOLD PLUS
Mitogen-NIL: 6.27 IU/mL
NIL: 0.03 IU/mL
QuantiFERON-TB Gold Plus: NEGATIVE
TB1-NIL: 0.01 IU/mL
TB2-NIL: 0 IU/mL

## 2021-03-28 LAB — HEPATITIS C ANTIBODY
Hepatitis C Ab: NONREACTIVE
SIGNAL TO CUT-OFF: 0.34 (ref <1.00)

## 2021-03-28 LAB — HIV-1 RNA ULTRAQUANT REFLEX TO GENTYP+
HIV 1 RNA Quant: 146000 copies/mL — ABNORMAL HIGH
HIV-1 RNA Quant, Log: 5.16 Log copies/mL — ABNORMAL HIGH

## 2021-03-28 LAB — HEPATITIS B SURFACE ANTIGEN: Hepatitis B Surface Ag: NONREACTIVE

## 2021-03-28 LAB — HIV-1 GENOTYPE: HIV-1 Genotype: DETECTED — AB

## 2021-03-28 LAB — RPR: RPR Ser Ql: REACTIVE — AB

## 2021-04-02 ENCOUNTER — Ambulatory Visit: Payer: Self-pay

## 2021-04-02 ENCOUNTER — Telehealth: Payer: Self-pay

## 2021-04-02 NOTE — Progress Notes (Deleted)
NCNS

## 2021-04-02 NOTE — Telephone Encounter (Signed)
Patient was scheduled to have last injection treatment for syphilis. Per Marcos Eke, NP patient has until  04/06/21 to receive injection or patient will have to start over weekly injections.Spoke with Case Manger who texted patient to make him aware of this information for rescheduling nurse visit.   Valarie Cones

## 2021-04-03 ENCOUNTER — Ambulatory Visit: Payer: Self-pay

## 2021-04-05 ENCOUNTER — Other Ambulatory Visit: Payer: Self-pay

## 2021-04-05 ENCOUNTER — Encounter (HOSPITAL_COMMUNITY): Payer: Self-pay | Admitting: Emergency Medicine

## 2021-04-05 ENCOUNTER — Emergency Department (HOSPITAL_COMMUNITY): Payer: Self-pay

## 2021-04-05 ENCOUNTER — Emergency Department (HOSPITAL_COMMUNITY)
Admission: EM | Admit: 2021-04-05 | Discharge: 2021-04-05 | Disposition: A | Discharge status: Home / Self Care | Payer: Self-pay | Attending: Emergency Medicine | Admitting: Emergency Medicine

## 2021-04-05 DIAGNOSIS — R6883 Chills (without fever): Secondary | ICD-10-CM | POA: Insufficient documentation

## 2021-04-05 DIAGNOSIS — Z21 Asymptomatic human immunodeficiency virus [HIV] infection status: Secondary | ICD-10-CM | POA: Insufficient documentation

## 2021-04-05 DIAGNOSIS — M546 Pain in thoracic spine: Secondary | ICD-10-CM | POA: Insufficient documentation

## 2021-04-05 DIAGNOSIS — F17210 Nicotine dependence, cigarettes, uncomplicated: Secondary | ICD-10-CM | POA: Insufficient documentation

## 2021-04-05 LAB — COMPREHENSIVE METABOLIC PANEL
ALT: 13 U/L (ref 0–44)
AST: 21 U/L (ref 15–41)
Albumin: 4.4 g/dL (ref 3.5–5.0)
Alkaline Phosphatase: 61 U/L (ref 38–126)
Anion gap: 9 (ref 5–15)
BUN: 14 mg/dL (ref 6–20)
CO2: 23 mmol/L (ref 22–32)
Calcium: 9.6 mg/dL (ref 8.9–10.3)
Chloride: 102 mmol/L (ref 98–111)
Creatinine, Ser: 1.14 mg/dL (ref 0.61–1.24)
GFR, Estimated: >60 mL/min (ref >60)
Glucose, Bld: 93 mg/dL (ref 70–99)
Potassium: 3.6 mmol/L (ref 3.5–5.1)
Sodium: 134 mmol/L — ABNORMAL LOW (ref 135–145)
Total Bilirubin: 0.9 mg/dL (ref 0.3–1.2)
Total Protein: 9.1 g/dL — ABNORMAL HIGH (ref 6.5–8.1)

## 2021-04-05 LAB — CBC WITH DIFFERENTIAL/PLATELET
Abs Immature Granulocytes: 0.01 10*3/uL (ref 0.00–0.07)
Basophils Absolute: 0 10*3/uL (ref 0.0–0.1)
Basophils Relative: 1 %
Eosinophils Absolute: 0.3 10*3/uL (ref 0.0–0.5)
Eosinophils Relative: 10 %
HCT: 37.8 % — ABNORMAL LOW (ref 39.0–52.0)
Hemoglobin: 13.2 g/dL (ref 13.0–17.0)
Immature Granulocytes: 0 %
Lymphocytes Relative: 41 %
Lymphs Abs: 1.4 10*3/uL (ref 0.7–4.0)
MCH: 34.3 pg — ABNORMAL HIGH (ref 26.0–34.0)
MCHC: 34.9 g/dL (ref 30.0–36.0)
MCV: 98.2 fL (ref 80.0–100.0)
Monocytes Absolute: 0.5 10*3/uL (ref 0.1–1.0)
Monocytes Relative: 14 %
Neutro Abs: 1.1 10*3/uL — ABNORMAL LOW (ref 1.7–7.7)
Neutrophils Relative %: 34 %
Platelets: 290 10*3/uL (ref 150–400)
RBC: 3.85 MIL/uL — ABNORMAL LOW (ref 4.22–5.81)
RDW: 12.1 % (ref 11.5–15.5)
WBC: 3.4 10*3/uL — ABNORMAL LOW (ref 4.0–10.5)
nRBC: 0 % (ref 0.0–0.2)

## 2021-04-05 MED ORDER — NAPROXEN 500 MG PO TABS
500.0000 mg | ORAL_TABLET | Freq: Once | ORAL | Status: AC
  Administered 2021-04-05: 500 mg via ORAL
  Filled 2021-04-05: qty 1

## 2021-04-05 NOTE — ED Provider Notes (Signed)
[facility] Emergency Department Provider Note MRN:  [id number]  Arrival date & time: 04/05/21     Chief Complaint   Back Pain   History of Present Illness   Lee Bridges is a 30 y.o. year-old male with a history of HIV presenting to the ED with chief complaint of back pain.  Location: Midline thoracic back Duration: 2 weeks Onset: Gradual Timing: Constant Description: Ache Severity: Severe Exacerbating/Alleviating Factors: Worse with palpation Associated Symptoms: Night chills Pertinent Negatives: No fever, no hematuria or dysuria, no numbness or weakness to the arms or legs, no bowel or bladder dysfunction, no trauma.  Additional History: Started HIV medicine about 3 weeks ago, has a low CD4 count.  Review of Systems  A complete 10 system review of systems was obtained and all systems are negative except as noted in the HPI and PMH.   Patient's Health History    Past Medical History:  Diagnosis Date   HIV (human immunodeficiency virus infection) ([place])     History reviewed. No pertinent surgical history.  Family History  Problem Relation Age of Onset   Arthritis Mother    Diabetes Father    Hypertension Father     Social History   Socioeconomic History   Marital status: Single    Spouse name: Not on file   Number of children: Not on file   Years of education: Not on file   Highest education level: Not on file  Occupational History   Not on file  Tobacco Use   Smoking status: Every Day    Packs/day: 0.50    Types: Cigarettes   Smokeless tobacco: Never  Substance and Sexual Activity   Alcohol use: Yes   Drug use: Yes    Types: Cocaine    Comment: socially   Sexual activity: Not on file  Other Topics Concern   Not on file  Social History Narrative   Lives with mother.    Social Determinants of Health   Financial Resource Strain: Not on file  Food Insecurity: Not on file  Transportation Needs: Not on file  Physical Activity:  Not on file  Stress: Not on file  Social Connections: Not on file  Intimate Partner Violence: Not on file     Physical Exam   Vitals:   04/05/21 0130 04/05/21 0310  BP: (!) 149/81 (!) 142/100  Pulse: 78 (!) 102  Resp: 18 18  Temp:    SpO2: 100% 100%    CONSTITUTIONAL: Well-appearing, NAD NEURO:  Alert and oriented x 3, no focal deficits EYES:  eyes equal and reactive ENT/NECK:  no LAD, no JVD CARDIO: Regular rate, well-perfused, normal S1 and S2 PULM:  CTAB no wheezing or rhonchi GI/GU:  normal bowel sounds, non-distended, non-tender MSK/SPINE:  No gross deformities, no edema; point tenderness in the midline thoracic spine SKIN:  no rash, atraumatic PSYCH:  Appropriate speech and behavior  *Additional and/or pertinent findings included in MDM below  Diagnostic and Interventional Summary    EKG Interpretation  Date/Time:    Ventricular Rate:    PR Interval:    QRS Duration:   QT Interval:    QTC Calculation:   R Axis:     Text Interpretation:         Labs Reviewed  CBC WITH DIFFERENTIAL/PLATELET - Abnormal; Notable for the following components:      Result Value   WBC 3.4 (*)    RBC 3.85 (*)    HCT 37.8 (*)  MCH 34.3 (*)    Neutro Abs 1.1 (*)    All other components within normal limits  COMPREHENSIVE METABOLIC PANEL - Abnormal; Notable for the following components:   Sodium 134 (*)    Total Protein 9.1 (*)    All other components within normal limits    CT Thoracic Spine Wo Contrast  Final Result      Medications  naproxen (NAPROSYN) tablet 500 mg (500 mg Oral Given 04/05/21 0314)     Procedures  /  Critical Care Procedures  ED Course and Medical Decision Making  I have reviewed the triage vital signs, the nursing notes, and pertinent available records from the EMR.  Listed above are laboratory and imaging tests that I personally ordered, reviewed, and interpreted and then considered in my medical decision making (see below for  details).  Very localized bony tenderness to the thoracic spine in this 30 year old male with a history of HIV, just started HIV meds, has a known low CD4 count.  No fever, no neurological deficits, is endorsing some night chills.  Labs are reassuring, will obtain screening CT thoracic spine to evaluate for any signs of bony lesion that would suggest an infectious source.     CT is normal, no symptoms to suggest myelopathy, appropriate for discharge.  Elmer Sow. Pilar Plate, MD [facility] Emergency Medicine [facility] [email or web address]  Final Clinical Impressions(s) / ED Diagnoses     ICD-10-CM   1. Acute midline thoracic back pain  M54.6       ED Discharge Orders     None        Discharge Instructions Discussed with and Provided to Patient:     Discharge Instructions      You were evaluated in the Emergency Department and after careful evaluation, we did not find any emergent condition requiring admission or further testing in the hospital.  Your exam/testing today was overall reassuring.  CT scan did not show any abnormalities.  Recommend Tylenol or Motrin at home for discomfort and follow-up with your regular doctors to discuss her symptoms.  Please return to the Emergency Department if you experience any worsening of your condition.  Thank you for allowing [place] to be a part of your care.         Sabas Sous, MD 04/05/21 [phone]

## 2021-04-05 NOTE — Discharge Instructions (Addendum)
You were evaluated in the Emergency Department and after careful evaluation, we did not find any emergent condition requiring admission or further testing in the hospital.  Your exam/testing today was overall reassuring.  CT scan did not show any abnormalities.  Recommend Tylenol or Motrin at home for discomfort and follow-up with your regular doctors to discuss her symptoms.  Please return to the Emergency Department if you experience any worsening of your condition.  Thank you for allowing [place] to be a part of your care.

## 2021-04-05 NOTE — ED Triage Notes (Addendum)
Patient states he went 6 years before getting his HIV treated. Patient started medications x3 weeks ago. Patient states 1-2 weeks after starting the medication, he began having sharp generalized back pain. Patient denies other symptoms. Endorses social cocaine use, last use x2 days ago. Patient concerned about viral load stating, "it's really low."

## 2021-04-06 ENCOUNTER — Ambulatory Visit (INDEPENDENT_AMBULATORY_CARE_PROVIDER_SITE_OTHER): Payer: Self-pay

## 2021-04-06 ENCOUNTER — Other Ambulatory Visit: Payer: Self-pay

## 2021-04-06 DIAGNOSIS — A539 Syphilis, unspecified: Secondary | ICD-10-CM

## 2021-04-06 MED ORDER — PENICILLIN G BENZATHINE 1200000 UNIT/2ML IM SUSY
1.2000 10*6.[IU] | PREFILLED_SYRINGE | Freq: Once | INTRAMUSCULAR | Status: AC
  Administered 2021-04-06: 1.2 10*6.[IU] via INTRAMUSCULAR

## 2021-04-06 NOTE — Progress Notes (Signed)
Reviewed and verified allergies with patient. Patient tolerated Bicillin injections well. Reinforced abstinence until treatment completed plus an additional 10 days, offered condoms and encouraged use. Advised patient to notify sexual partners for testing and treatment. Patient verbalized understanding.   Khallid Pasillas D Talayeh Bruinsma, RN  

## 2021-04-06 NOTE — Telephone Encounter (Signed)
Patient scheduled to come in today 11/7, spoke with Mitch who will bring him.   Sandie Ano, RN

## 2021-04-07 ENCOUNTER — Ambulatory Visit: Payer: Self-pay

## 2021-04-07 ENCOUNTER — Encounter: Payer: Self-pay | Admitting: Infectious Diseases

## 2021-04-07 ENCOUNTER — Other Ambulatory Visit: Payer: Self-pay

## 2021-04-09 ENCOUNTER — Ambulatory Visit (INDEPENDENT_AMBULATORY_CARE_PROVIDER_SITE_OTHER): Payer: No Payment, Other | Admitting: Physician Assistant

## 2021-04-09 ENCOUNTER — Other Ambulatory Visit: Payer: Self-pay

## 2021-04-09 VITALS — BP 109/89 | HR 78 | Temp 98.2°F | Ht 62.0 in | Wt 115.0 lb

## 2021-04-09 DIAGNOSIS — F310 Bipolar disorder, current episode hypomanic: Secondary | ICD-10-CM | POA: Insufficient documentation

## 2021-04-09 DIAGNOSIS — F39 Unspecified mood [affective] disorder: Secondary | ICD-10-CM | POA: Diagnosis not present

## 2021-04-09 DIAGNOSIS — F332 Major depressive disorder, recurrent severe without psychotic features: Secondary | ICD-10-CM | POA: Insufficient documentation

## 2021-04-09 DIAGNOSIS — F331 Major depressive disorder, recurrent, moderate: Secondary | ICD-10-CM | POA: Insufficient documentation

## 2021-04-09 DIAGNOSIS — F411 Generalized anxiety disorder: Secondary | ICD-10-CM | POA: Diagnosis not present

## 2021-04-09 MED ORDER — ESCITALOPRAM OXALATE 20 MG PO TABS: 20.0000 mg | ORAL_TABLET | Freq: Every day | ORAL | 3 refills | Status: DC

## 2021-04-09 MED ORDER — TRAZODONE HCL 50 MG PO TABS: 50.0000 mg | ORAL_TABLET | Freq: Every evening | ORAL | 3 refills | Status: DC | PRN

## 2021-04-09 MED ORDER — OLANZAPINE 5 MG PO TABS: 5.0000 mg | ORAL_TABLET | Freq: Every day | ORAL | 1 refills | Status: DC
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
## 2021-04-14 ENCOUNTER — Encounter (HOSPITAL_COMMUNITY): Payer: Self-pay | Admitting: Physician Assistant

## 2021-04-16 ENCOUNTER — Other Ambulatory Visit: Payer: Self-pay

## 2021-04-16 ENCOUNTER — Ambulatory Visit: Payer: Self-pay

## 2021-04-16 NOTE — Progress Notes (Unsigned)
Patient ID: Lee Bridges, male   DOB: 08-Sep-1990, 30 y.o.   MRN: [id number]

## 2021-04-16 NOTE — Progress Notes (Deleted)
Patient ID: Lee Bridges, male   DOB: 08-Sep-1990, 30 y.o.   MRN: [id number]

## 2021-04-16 NOTE — Progress Notes (Unsigned)
Patient ID: Lee Bridges, male   DOB: 1990-10-08, 30 y.o.   MRN: [id number]  Therapist met with client as scheduled and continued rapport building. Therapist introduced and shared some information about herself and encouraged the same from client in order to build trust and openness within the counseling relationship. Therapist discussed / developed / reviewed treatment plan with client based on the result from client's assessment. Therapist discussed their current issues /diagnosis of DSM-5/as well as recommendations and target population eligibility services. Therapist assessed for SI/HI during session  Client met with therapist as scheduled. Client presented alert mood was euthymic; affect appeared to be congruent with client report of mood. Appearance was relaxed/casual, and attitude was appropriate and casual. Client was receptive to rapport building and goals implemented by therapist. Client shared some information about herself, such as: expressing likes, dislikes, and strengths. Client was able to make connections between consequences, challenges and alternative thoughts of mental health recovery. Client talked to clinician about his traumatic childhood and told a story of his up brining. Client is skilled and talented with telling stories about his past and historical events. Client talked about all his previous possessions that he lost after his diagnosis. Client also shared his previous experience with being homeless and his story making the news. Client made the new due to his determination to graduate by walking 29m back and forth to school each day. Client progress toward therapeutic goals remain minimal at this time. Client denied SI/HI.

## 2021-04-16 NOTE — Progress Notes (Unsigned)
Patient ID: Lee Bridges, male   DOB: [birth year], 30 y.o.   MRN: [id number]  Therapist met with client as scheduled and continued rapport building. Therapist introduced and shared some information about herself and encouraged the same from client in order to build trust and openness within the counseling relationship. Therapist discussed / developed / reviewed treatment plan with client based on the result from client's assessment. Therapist discussed their current issues /diagnosis of DSM-5/as well as recommendations and target population eligibility services. Therapist assessed for SI/HI during session  Client met with therapist as scheduled. Client presented alert mood was euthymic; affect appeared to be congruent with client report of mood. Appearance was relaxed/casual, and attitude was appropriate and casual. Client was receptive to rapport building and goals implemented by therapist. Client shared some information about herself, such as: expressing likes, dislikes, and strengths. Client was able to make connections between consequences, challenges and alternative thoughts of mental health recovery. Client talked his current diagnosis and describing his current symptoms associated with that. Client talked about his lack of income and told more stories about his family and growing up in [place], [place]. Client talked about different agencies that could help him and opportunities for employment. 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 Client also informed clinician about the different avenues he has gone through to push his SSI/SSDI.  Client progress toward therapeutic goals remain minimal at this time. Client denied SI/HI.

## 2021-04-17 ENCOUNTER — Other Ambulatory Visit: Payer: Self-pay

## 2021-04-17 ENCOUNTER — Ambulatory Visit (INDEPENDENT_AMBULATORY_CARE_PROVIDER_SITE_OTHER): Payer: Self-pay | Admitting: Pharmacist

## 2021-04-17 DIAGNOSIS — B20 Human immunodeficiency virus [HIV] disease: Secondary | ICD-10-CM

## 2021-04-17 DIAGNOSIS — Z21 Asymptomatic human immunodeficiency virus [HIV] infection status: Secondary | ICD-10-CM

## 2021-04-17 MED ORDER — SULFAMETHOXAZOLE-TRIMETHOPRIM 400-80 MG PO TABS: 1.0000 | ORAL_TABLET | Freq: Every day | ORAL | 2 refills | Status: DC

## 2021-04-17 NOTE — Progress Notes (Signed)
04/17/2021  HPI: Lee Bridges is a 30 y.o. male who presents to the [facility] pharmacy clinic for HIV follow-up.  Patient Active Problem List   Diagnosis Date Noted   Generalized anxiety disorder 04/09/2021   Severe episode of recurrent major depressive disorder, without psychotic features (HCC) 04/09/2021   Unspecified mood (affective) disorder (HCC) 04/09/2021   Cocaine abuse (HCC) 03/23/2021   HIV (human immunodeficiency virus infection) (HCC) 03/20/2021   Syphilis 03/20/2021   Severe episode of recurrent major depressive disorder, with psychotic features (HCC)     Patient's Medications  New Prescriptions   No medications on file  Previous Medications   BICTEGRAVIR-EMTRICITABINE-TENOFOVIR AF (BIKTARVY) 50-200-25 MG TABS TABLET    Take 1 tablet by mouth daily. Try to take at the same time each day with or without food.   BICTEGRAVIR-EMTRICITABINE-TENOFOVIR AF (BIKTARVY) 50-200-25 MG TABS TABLET    Take 1 tablet by mouth daily. Try to take at the same time each day with or without food.   ESCITALOPRAM (LEXAPRO) 20 MG TABLET    Take 1 tablet (20 mg total) by mouth daily.   OLANZAPINE (ZYPREXA) 5 MG TABLET    Take 1 tablet (5 mg total) by mouth at bedtime.   SULFAMETHOXAZOLE-TRIMETHOPRIM (BACTRIM) 400-80 MG TABLET    Take 1 tablet by mouth daily.   TRAZODONE (DESYREL) 50 MG TABLET    Take 1 tablet (50 mg total) by mouth at bedtime as needed for sleep.  Modified Medications   No medications on file  Discontinued Medications   No medications on file    Allergies: Allergies  Allergen Reactions   Fish-Derived Products Hives and Nausea And Vomiting    Past Medical History: Past Medical History:  Diagnosis Date   HIV (human immunodeficiency virus infection) (HCC)     Social History: Social History   Socioeconomic History   Marital status: Single    Spouse name: Not on file   Number of children: Not on file   Years of education: Not on file   Highest education level: Not on  file  Occupational History   Not on file  Tobacco Use   Smoking status: Every Day    Packs/day: 0.50    Types: Cigarettes   Smokeless tobacco: Never  Substance and Sexual Activity   Alcohol use: Yes   Drug use: Yes    Types: Cocaine    Comment: socially   Sexual activity: Not on file  Other Topics Concern   Not on file  Social History Narrative   Lives with mother.    Social Determinants of Health   Financial Resource Strain: Not on file  Food Insecurity: Not on file  Transportation Needs: Not on file  Physical Activity: Not on file  Stress: Not on file  Social Connections: Not on file    Labs: Lab Results  Component Value Date   HIV1RNAQUANT 146,000 (H) 03/20/2021    RPR and STI Lab Results  Component Value Date   LABRPR REACTIVE (A) 03/20/2021   LABRPR Reactive (A) 01/25/2021   RPRTITER 1:16 (H) 03/20/2021    STI Results GC CT  03/20/2021 Negative Negative  03/20/2021 Negative Negative  03/20/2021 Negative Negative    Hepatitis B Lab Results  Component Value Date   HEPBSAB NON-REACTIVE 03/20/2021   HEPBSAG NON-REACTIVE 03/20/2021   Hepatitis C Lab Results  Component Value Date   HEPCAB NON-REACTIVE 03/20/2021   Hepatitis A Lab Results  Component Value Date   HAV NON-REACTIVE 03/20/2021   Lipids:  No results found for: CHOL, TRIG, HDL, CHOLHDL, VLDL, LDLCALC  Current HIV Regimen: Biktarvy  Assessment: Lee Bridges presents to clinic today for HIV follow-up. He was started on Biktarvy last month and reports no missed doses since he started. He was also started on Bactrim with a CD4 count of 183. He reports that he feels thirsty with the Bactrim. I counseled on importance of staying hydrated while on the medication. He also had some nausea in the beginning but overall much improved. He seems to be doing very well and feeling more like himself. He inquired about seeing dentistry when they are available in the clinic. I provided him with the contact  information to schedule an appointment. We will also administer his second Hepatitis B vaccine today. All concerns and questions addressed at today's visit.  Administered Heplisav B vaccine into right deltoid. Monitored patient for 15 minutes post-injection with no concerns or issues.    Plan: Check HIV RNA, CD4, BMET Administer Heplisav-B Vaccine Follow-up with Judeth Cornfield on 07/10/21 @11 :30  , PharmD PGY2 Infectious Diseases Pharmacy Resident

## 2021-04-20 LAB — BASIC METABOLIC PANEL
BUN/Creatinine Ratio: 14 (calc) (ref 6–22)
BUN: 18 mg/dL (ref 7–25)
CO2: 23 mmol/L (ref 20–32)
Calcium: 9.1 mg/dL (ref 8.6–10.3)
Chloride: 108 mmol/L (ref 98–110)
Creat: 1.25 mg/dL — ABNORMAL HIGH (ref 0.60–1.24)
Glucose, Bld: 86 mg/dL (ref 65–99)
Potassium: 4.2 mmol/L (ref 3.5–5.3)
Sodium: 138 mmol/L (ref 135–146)

## 2021-04-20 LAB — HIV-1 RNA QUANT-NO REFLEX-BLD
HIV 1 RNA Quant: 243 Copies/mL — ABNORMAL HIGH
HIV-1 RNA Quant, Log: 2.39 Log cps/mL — ABNORMAL HIGH

## 2021-04-20 LAB — T-HELPER CELLS (CD4) COUNT (NOT AT ARMC)
Absolute CD4: 256 cells/uL — ABNORMAL LOW (ref 490–1740)
CD4 T Helper %: 25 % — ABNORMAL LOW (ref 30–61)
Total lymphocyte count: 1035 cells/uL (ref 850–3900)

## 2021-04-21 ENCOUNTER — Ambulatory Visit: Payer: Self-pay

## 2021-04-21 ENCOUNTER — Other Ambulatory Visit: Payer: Self-pay

## 2021-04-21 NOTE — Progress Notes (Unsigned)
Therapist met with client as scheduled and continued rapport building. Therapist introduced and shared some information about herself and encouraged the same from client in order to build trust and openness within the counseling relationship. Therapist discussed / developed / reviewed treatment plan with client based on the result from client's assessment. Therapist discussed their current issues /diagnosis of DSM-5/as well as recommendations and target population eligibility services. Therapist assessed for SI/HI during session  Client met with therapist as scheduled. Client presented alert mood was euthymic; affect appeared to be congruent with client report of mood. Appearance was relaxed/casual, and attitude was appropriate and casual. Client was receptive to rapport building and goals implemented by therapist. Client shared some information about herself, such as: expressing likes, dislikes, and strengths. Client was able to make connections between consequences, challenges and alternative thoughts of mental health recovery. Client talked his current diagnosis and describing his current symptoms associated with that. Client talked about his pursuits to receive SSI/SSDI and other forms of income. Client was able to process and talk about other communities that could serve him. Client normally talks about surface level things and seems to still be "feeling out" the processes of therapy. Client is interested heavily in historical events and celebrities, much of what he studies.  Client progress toward therapeutic goals remain minimal at this time. Client denied SI/HI.

## 2021-04-28 ENCOUNTER — Ambulatory Visit: Payer: Self-pay

## 2021-05-03 ENCOUNTER — Other Ambulatory Visit (HOSPITAL_COMMUNITY): Payer: Self-pay | Admitting: Physician Assistant

## 2021-05-03 DIAGNOSIS — F39 Unspecified mood [affective] disorder: Secondary | ICD-10-CM

## 2021-05-04 ENCOUNTER — Telehealth: Payer: Self-pay

## 2021-05-04 ENCOUNTER — Ambulatory Visit (HOSPITAL_COMMUNITY)
Admission: EM | Admit: 2021-05-04 | Discharge: 2021-05-04 | Discharge status: Against Medical Advice | Payer: No Payment, Other

## 2021-05-04 NOTE — Telephone Encounter (Signed)
Patient called, unable to get olanzapine. RN called [facility], but unable to speak to a pharmacist. [place] to triage for follow up tomorrow.     Sandie Ano, RN

## 2021-05-04 NOTE — ED Notes (Signed)
Pt has eloped from waiting area staff attempted to room pt but he refused and left the building.

## 2021-05-04 NOTE — Telephone Encounter (Signed)
RN called patient to update him that office will follow up on olanzapine tomorrow. He says not to worry about because he has called EMS and [facility] since he is in a "crisis" without his medication. He says he has not slept in 4 days and has not been taking his ART.   Sandie Ano, RN

## 2021-05-04 NOTE — BH Assessment (Signed)
Pt to [facility] voluntarily reporting that he has been without his medications (trazodone, olanzapine and lexapro) for about 3+ days. Pt reports symptoms of anger, "flipping out", having bad "SI" thoughts, bad dreams, increased anxiety and hallucinations since he has been off the medications. Pt reports he sees Eddie at [facility] for [employer] but he is unaware when his next appointment is. Pt reports he has been doubling up on his medications to help with his symptoms but now he is out of medications.  Pt denies current SI, HI, AVH and substance use at this time. Pt reports cocaine use about 4 months ago. Pt reports calling his PCP how recommended he call [phone] due to said symptoms and being out of medications.   Pt is routine

## 2021-05-05 NOTE — Telephone Encounter (Signed)
Spoke with [facility], they are unable to fill patient's olanzapine as it was just filled 12/2 by [employer] in [place].   Called patient to relay this information, no answer. Left voicemail stating MyChart message will be sent and to call with any questions.    Sandie Ano, RN

## 2021-05-07 ENCOUNTER — Other Ambulatory Visit: Payer: Self-pay

## 2021-05-07 ENCOUNTER — Ambulatory Visit: Payer: Self-pay

## 2021-05-12 NOTE — Progress Notes (Unsigned)
Therapist met with client as scheduled and discussed treatment plan/case management progress. Therapist used active listening skills to provide client with opportunity to disclose previous challenges and successes. Therapist assessed for SI/HI during session and will follow-up with client during the next session.   Client met with therapist as scheduled. Client presented alert mood was euthymic; affect appeared to be congruent with client report of mood. Appearance was relaxed/casual, and attitude was appropriate and casual. Client was receptive to rapport building and goals implemented by therapist. Client shared some information about herself, such as: expressing likes, dislikes, and strengths. Client was able to make connections between consequences, challenges and alternative thoughts of mental health recovery. Client informed by his attorney that he was approved for SSI/SSDI; however, was has not received a letter from the social security office. Client reports that he also got approved for another form of SSI/SSDI; none of which he received confirmation through the social security office. Client stated that when he will be looking to move when he gets his official approval letter.  Client progress toward therapeutic goals remain minimal at this time. Client denied SI/HI.

## 2021-05-14 ENCOUNTER — Ambulatory Visit: Payer: Self-pay

## 2021-05-14 ENCOUNTER — Telehealth (HOSPITAL_COMMUNITY): Payer: Self-pay

## 2021-05-14 ENCOUNTER — Telehealth (HOSPITAL_COMMUNITY): Payer: Self-pay | Admitting: Physician Assistant
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
## 2021-05-14 NOTE — BH Assessment (Signed)
Care Management [facility] Discharge Follow Up   Writer attempted to make contact with patient today and was unsuccessful.  Writer left a HIPPA compliant voice message.   Per chart review, patient followed up with his established provider Monica Martinez, LCSW) on 05-07-2021 at [facility] and Link Snuffer, NP on 06-11-2021 at [facility] on 2nd foor

## 2021-05-15 ENCOUNTER — Other Ambulatory Visit: Payer: Self-pay

## 2021-06-04 ENCOUNTER — Ambulatory Visit: Payer: Self-pay

## 2021-06-04 ENCOUNTER — Other Ambulatory Visit: Payer: Self-pay

## 2021-06-04 NOTE — Progress Notes (Unsigned)
Therapist met with client as scheduled and discussed treatment plan/case management progress. Therapist used active listening skills to provide client with opportunity to disclose previous challenges and successes. Therapist assessed for SI/HI during session and will follow-up with client during the next session.  Client met with therapist as scheduled. Client presented alert mood and appeared to euthymic; affect was congruent with client report of mood. Client was able to make connections between consequences, challenges and alternative thoughts of mental health recovery during this session. Client continues to express their needs in the development of treatment goals and described different communities of which can serve an added support for them. Client expressed concerns about different issues in their life. Client was able to explain his process for SSI/SSDI and how he was able to obtain it. Client was able to process the steps in order to help other clients with their application process. Therapeutic goal(s) continues to remain minimal at this time. client denied SI/HI.

## 2021-06-08 ENCOUNTER — Ambulatory Visit (HOSPITAL_COMMUNITY): Payer: No Payment, Other | Admitting: Psychiatry

## 2021-06-11 ENCOUNTER — Ambulatory Visit: Payer: Self-pay

## 2021-06-11 ENCOUNTER — Telehealth (INDEPENDENT_AMBULATORY_CARE_PROVIDER_SITE_OTHER): Payer: No Payment, Other | Admitting: Physician Assistant

## 2021-06-11 ENCOUNTER — Other Ambulatory Visit: Payer: Self-pay

## 2021-06-11 DIAGNOSIS — F331 Major depressive disorder, recurrent, moderate: Secondary | ICD-10-CM

## 2021-06-11 DIAGNOSIS — F39 Unspecified mood [affective] disorder: Secondary | ICD-10-CM

## 2021-06-11 DIAGNOSIS — F411 Generalized anxiety disorder: Secondary | ICD-10-CM | POA: Diagnosis not present

## 2021-06-11 MED ORDER — OLANZAPINE 10 MG PO TABS: 10.0000 mg | ORAL_TABLET | Freq: Every day | ORAL | 1 refills | Status: DC

## 2021-06-11 NOTE — Progress Notes (Unsigned)
Therapist met with client as scheduled and discussed treatment plan/case management progress. Therapist used active listening skills to provide client with opportunity to disclose previous challenges and successes. Therapist assessed for SI/HI during session and will follow-up with client during the next session.   Client met with therapist as scheduled. Client presented alert mood was euthymic; affect appeared to be congruent with client report of mood. Appearance was relaxed/casual, and attitude was appropriate and casual. Client was receptive to rapport building and goals implemented by therapist. Client shared some information about herself, such as: expressing likes, dislikes, and strengths. Client was able to make connections between consequences, challenges and alternative thoughts of mental health recovery. Client talked extensively about the SSI/SSDI process and how he was able to maneuver the application process. Client talked about light-hearted issues and touched on his previous trauma. Client is more than happy to share some of his current challenges with bills and states that he will bring in his SSI/SSDI approval letter for proof of his upcoming payments. Client progress toward goals in dealing with trauma remain minimal at this time. Client denied SI/HI.

## 2021-06-12 ENCOUNTER — Encounter (HOSPITAL_COMMUNITY): Payer: Self-pay | Admitting: Physician Assistant

## 2021-06-12 DIAGNOSIS — F331 Major depressive disorder, recurrent, moderate: Secondary | ICD-10-CM | POA: Insufficient documentation

## 2021-06-12 NOTE — Progress Notes (Signed)
BH MD/PA/NP OP Progress Note  Virtual Visit via Telephone Note  I connected with Lee Bridges on 06/11/21 at 11:00 AM EST by telephone and verified that I am speaking with the correct person using two identifiers.  Location: Patient: Home Provider: Clinic   I discussed the limitations, risks, security and privacy concerns of performing an evaluation and management service by telephone and the availability of in person appointments. I also discussed with the patient that there may be a patient responsible charge related to this service. The patient expressed understanding and agreed to proceed.  Follow Up Instructions:  I discussed the assessment and treatment plan with the patient. The patient was provided an opportunity to ask questions and all were answered. The patient agreed with the plan and demonstrated an understanding of the instructions.   The patient was advised to call back or seek an in-person evaluation if the symptoms worsen or if the condition fails to improve as anticipated.  I provided 21 minutes of non-face-to-face time during this encounter.  Meta Hatchet, PA   06/11/2021 9:32 PM Lee Bridges  MRN:  [id number]  Chief Complaint: Follow up and medication management  HPI:   Lee Geraldo "[name]" is a 31 year old male with a past psychiatric history significant for major depressive disorder, unspecified mood affective disorder, and generalized anxiety disorder who presents to [facility] via virtual telephone visit for follow-up and medication management.  Patient is currently being managed on the following medications:  Trazodone 50 mg at bedtime Olanzapine 5 mg at bedtime Lexapro 20 mg daily  Patient reports that he feels that his medications are working well.  He does note that he had an episode of mood disturbance when he ran out of his medications but was able to get a refill with not much issue.  Patient denies any issues  with his depression and reports that he has been less prone to snapping at people.  He endorses feeling more happy and engaging in activities that he likes to do such as drawing.  Patient also endorses more activity, a stark contrast from when he felt like doing nothing prior to being placed on olanzapine.  A PHQ-9 screen was performed with the patient scoring a 15.  A GAD-7 screen was also performed with the patient scoring a 2.  Patient is alert and oriented x4, calm, cooperative, and fully engaged in conversation during the encounter.  Patient endorses being very happy.  Patient denies suicidal or homicidal ideations.  He further denies auditory or visual hallucinations and does not appear to be responding to internal/external stimuli.  Patient endorses good sleep and receives on average 9 hours of sleep each night.  Patient endorses good appetite and eats on average 3 meals per day.  Patient denies alcohol consumption, tobacco use, and illicit drug use.  Visit Diagnosis:    ICD-10-CM   1. Moderate episode of recurrent major depressive disorder (HCC)  F33.1     2. Unspecified mood (affective) disorder (HCC)  F39 OLANZapine (ZYPREXA) 10 MG tablet    3. Generalized anxiety disorder  F41.1       Past Psychiatric History:  Unspecified mood disorder Major depressive disorder Generalized anxiety disorder  Past Medical History:  Past Medical History:  Diagnosis Date   HIV (human immunodeficiency virus infection) (HCC)    History reviewed. No pertinent surgical history.  Family Psychiatric History:  Patient reports that psychiatric illnesses run on his mother side of the family.  Cousin - Bipolar disorder/mood swings Cousin - Schizophrenia  Family History:  Family History  Problem Relation Age of Onset   Arthritis Mother    Diabetes Father    Hypertension Father     Social History:  Social History   Socioeconomic History   Marital status: Single    Spouse name: Not on file    Number of children: Not on file   Years of education: Not on file   Highest education level: Not on file  Occupational History   Not on file  Tobacco Use   Smoking status: Every Day    Packs/day: 0.50    Types: Cigarettes   Smokeless tobacco: Never  Substance and Sexual Activity   Alcohol use: Yes   Drug use: Yes    Types: Cocaine    Comment: socially   Sexual activity: Not on file  Other Topics Concern   Not on file  Social History Narrative   Lives with mother.    Social Determinants of Health   Financial Resource Strain: Not on file  Food Insecurity: Not on file  Transportation Needs: Not on file  Physical Activity: Not on file  Stress: Not on file  Social Connections: Not on file    Allergies:  Allergies  Allergen Reactions   Fish-Derived Products Hives and Nausea And Vomiting    Metabolic Disorder Labs: No results found for: HGBA1C, MPG No results found for: PROLACTIN No results found for: CHOL, TRIG, HDL, CHOLHDL, VLDL, LDLCALC No results found for: TSH  Therapeutic Level Labs: No results found for: LITHIUM No results found for: VALPROATE No components found for:  CBMZ  Current Medications: Current Outpatient Medications  Medication Sig Dispense Refill   bictegravir-emtricitabine-tenofovir AF (BIKTARVY) 50-200-25 MG TABS tablet Take 1 tablet by mouth daily. Try to take at the same time each day with or without food. (Patient not taking: Reported on 04/05/2021) 14 tablet 0   bictegravir-emtricitabine-tenofovir AF (BIKTARVY) 50-200-25 MG TABS tablet Take 1 tablet by mouth daily. Try to take at the same time each day with or without food. 30 tablet 5   escitalopram (LEXAPRO) 20 MG tablet Take 1 tablet (20 mg total) by mouth daily. 30 tablet 3   OLANZapine (ZYPREXA) 10 MG tablet Take 1 tablet (10 mg total) by mouth at bedtime. 30 tablet 1   sulfamethoxazole-trimethoprim (BACTRIM) 400-80 MG tablet Take 1 tablet by mouth daily. 30 tablet 2   traZODone (DESYREL)  50 MG tablet Take 1 tablet (50 mg total) by mouth at bedtime as needed for sleep. 30 tablet 3   No current facility-administered medications for this visit.     Musculoskeletal: Strength & Muscle Tone: Unable to assess due to telemedicine visit Gait & Station: Unable to assess due to telemedicine visit Patient leans: Unable to assess due to telemedicine visit  Psychiatric Specialty Exam: Review of Systems  Psychiatric/Behavioral:  Negative for decreased concentration, dysphoric mood, hallucinations, self-injury, sleep disturbance and suicidal ideas. The patient is not nervous/anxious and is not hyperactive.    There were no vitals taken for this visit.There is no height or weight on file to calculate BMI.  General Appearance: Unable to assess due to telemedicine visit  Eye Contact:  Unable to assess due to telemedicine visit  Speech:  Clear and Coherent and Normal Rate  Volume:  Normal  Mood:  Euthymic  Affect:  Appropriate  Thought Process:  Coherent, Goal Directed, and Descriptions of Associations: Intact  Orientation:  Full (Time, Place, and Person)  Thought Content: WDL   Suicidal Thoughts:  No  Homicidal Thoughts:  No  Memory:  Immediate;   Good Recent;   Good Remote;   Good  Judgement:  Good  Insight:  Fair  Psychomotor Activity:  Normal  Concentration:  Concentration: Good and Attention Span: Good  Recall:  Good  Fund of Knowledge: Good  Language: Good  Akathisia:  NA  Handed:  Right  AIMS (if indicated): not done  Assets:  Communication Skills Desire for Improvement Housing Social Support  ADL's:  Intact  Cognition: WNL  Sleep:  Good   Screenings: GAD-7    Flowsheet Row [facility]  Total GAD-7 Score 2 19      PHQ2-9    Flowsheet Row [facility]  [facility]  PHQ-2 Total Score 2 5 0 4  PHQ-9 Total Score 15 23 -- 12      Flowsheet Row [facility]  C-SSRS RISK CATEGORY Moderate Risk Moderate Risk No Risk        Assessment and Plan:   Lee KaufmanDon Bertino "[name]" is a 31 year old male with a past psychiatric history significant for major depressive disorder, unspecified mood affective disorder, and generalized anxiety disorder who [facility] via virtual telephone visit for follow-up and medication management.  Patient reports that he is doing extremely well on his olanzapine since taking the medication.  He denies depressive symptoms, however, he scored a 15 on his PHQ-9 screen.  Patient is interested in increasing his dosage of olanzapine.  Patient to be placed on olanzapine 10 mg at bedtime for the management of his unspecified mood disorder.  Patient to continue taking all other medications as prescribed.  Patient's medication to be e-prescribed to pharmacy of choice.  1. Unspecified mood (affective) disorder (HCC)  - OLANZapine (ZYPREXA) 10 MG tablet; Take 1 tablet (10 mg total) by mouth at bedtime.  Dispense: 30 tablet; Refill: 1  2. Moderate episode of recurrent major depressive disorder (HCC) Patient to continue taking Lexapro 20 mg daily for the management of his major depressive disorder Patient to continue taking trazodone 50 mg at bedtime for the management of his major depressive disorder  3. Generalized anxiety disorder Patient to continue taking Lexapro 20 mg daily for the management of his  generalized anxiety disorder  Patient to follow up in 6 weeks Provider spent a total of 21 minutes with the patient/reviewing patient's chart  Meta HatchetUchenna E Kadeshia Kasparian, PA 06/11/2021, 9:32 PM

## 2021-06-15 NOTE — Telephone Encounter (Signed)
Message acknowledged and reviewed. Patient was last seen on 06/11/2021 and has a follow up appointment scheduled for 07/24/2021.

## 2021-06-16 ENCOUNTER — Ambulatory Visit: Payer: Self-pay

## 2021-06-18 ENCOUNTER — Ambulatory Visit: Payer: Self-pay

## 2021-06-18 ENCOUNTER — Other Ambulatory Visit: Payer: Self-pay

## 2021-06-19 ENCOUNTER — Other Ambulatory Visit: Payer: Self-pay | Admitting: Infectious Diseases

## 2021-06-19 DIAGNOSIS — Z21 Asymptomatic human immunodeficiency virus [HIV] infection status: Secondary | ICD-10-CM

## 2021-06-19 DIAGNOSIS — A539 Syphilis, unspecified: Secondary | ICD-10-CM

## 2021-06-23 NOTE — Progress Notes (Unsigned)
Therapist met with client as scheduled and discussed treatment plan/case management progress. Therapist used active listening skills to provide client with opportunity to disclose previous challenges and successes. Therapist assessed for SI/HI during session and will follow-up with client during the next session.   Client met with therapist as scheduled. Client presented alert mood was euthymic; affect appeared to be congruent with client report of mood. Appearance was relaxed/casual, and attitude was appropriate and casual. Client was able to make connections between consequences, challenges and alternative thoughts of mental health recovery. Client talked extensively about the SSI/SSDI process. Client talked about his SSI/SSDI and stated that he was doing well and that he is compliant with his medications and feels happier now. Client is observed to be more upbeat and smiling during session. Client informed clinician that he has not received his SSI/SSDI payment as of yet. Clinician will begin scheduling client once a month. Client progress toward goals in dealing with trauma remain minimal at this time.  Client denied SI/HI.

## 2021-07-02 ENCOUNTER — Ambulatory Visit: Payer: Self-pay

## 2021-07-02 ENCOUNTER — Other Ambulatory Visit: Payer: Self-pay

## 2021-07-02 DIAGNOSIS — Z21 Asymptomatic human immunodeficiency virus [HIV] infection status: Secondary | ICD-10-CM

## 2021-07-02 DIAGNOSIS — A539 Syphilis, unspecified: Secondary | ICD-10-CM

## 2021-07-06 LAB — HIV-1 RNA QUANT-NO REFLEX-BLD
HIV 1 RNA Quant: 48 Copies/mL — ABNORMAL HIGH
HIV-1 RNA Quant, Log: 1.68 Log cps/mL — ABNORMAL HIGH

## 2021-07-06 LAB — RPR: RPR Ser Ql: REACTIVE — AB

## 2021-07-06 LAB — T-HELPER CELLS (CD4) COUNT (NOT AT ARMC)
Absolute CD4: 291 cells/uL — ABNORMAL LOW (ref 490–1740)
CD4 T Helper %: 20 % — ABNORMAL LOW (ref 30–61)
Total lymphocyte count: 1465 cells/uL (ref 850–3900)

## 2021-07-06 LAB — RPR TITER: RPR Titer: 1:8 {titer} — ABNORMAL HIGH

## 2021-07-06 LAB — FLUORESCENT TREPONEMAL AB(FTA)-IGG-BLD: Fluorescent Treponemal ABS: REACTIVE — AB

## 2021-07-07 ENCOUNTER — Encounter: Payer: Self-pay | Admitting: Infectious Diseases

## 2021-07-07 NOTE — Progress Notes (Unsigned)
Therapist met with client as scheduled and discussed treatment plan/case management progress. Therapist used active listening skills to provide client with opportunity to disclose previous challenges and successes. Therapist assessed for SI/HI during session and will follow-up with client during the next session.   Client met with therapist as scheduled. Client presented alert mood was euthymic; affect appeared to be congruent with client report of mood. Appearance was relaxed/casual, and attitude was appropriate and casual. Client was able to make connections between consequences, challenges and alternative thoughts of mental health recovery. Client talked extensively about the SSI/SSDI process. Client talked about his SSI/SSDI and stated that he was doing well and that he is compliant with his medications and feels happier now. Client has not confirmed whether he has won his disability and still has not received any funding as of yet. Client progress toward goals in dealing with trauma remain minimal at this time. Client denied SI/HI.

## 2021-07-10 ENCOUNTER — Ambulatory Visit (INDEPENDENT_AMBULATORY_CARE_PROVIDER_SITE_OTHER): Payer: Self-pay | Admitting: Infectious Diseases

## 2021-07-10 ENCOUNTER — Encounter: Payer: Self-pay | Admitting: Infectious Diseases

## 2021-07-10 ENCOUNTER — Other Ambulatory Visit: Payer: Self-pay

## 2021-07-10 VITALS — BP 136/76 | HR 86 | Temp 97.6°F | Wt 137.0 lb

## 2021-07-10 DIAGNOSIS — Z8619 Personal history of other infectious and parasitic diseases: Secondary | ICD-10-CM

## 2021-07-10 DIAGNOSIS — R443 Hallucinations, unspecified: Secondary | ICD-10-CM

## 2021-07-10 DIAGNOSIS — Z21 Asymptomatic human immunodeficiency virus [HIV] infection status: Secondary | ICD-10-CM

## 2021-07-10 DIAGNOSIS — Z23 Encounter for immunization: Secondary | ICD-10-CM

## 2021-07-10 MED ORDER — BICTEGRAVIR-EMTRICITAB-TENOFOV 50-200-25 MG PO TABS: 1.0000 | ORAL_TABLET | Freq: Every day | ORAL | 5 refills | Status: DC

## 2021-07-10 NOTE — Progress Notes (Signed)
Name: Lee Bridges  DOB: 03-08-1991 MRN: [id number] PCP: Pcp, No    Brief Narrative:  Lee Bridges is a 31 y.o. male with HIV disease, Dx 10/17/2014. Never on medications.  CD4 nadir unknown VL unknown HIV Risk: sexual  History of OIs: none Intake Labs 03/20/21: Hep B sAg (), sAb (), cAb (); Hep A (), Hep C () Quantiferon () HLA B*5701 () G6PD: ()   Previous Regimens: Biktarvy    Genotypes: Pending   Subjective:   CC:  Has had some hallucinations with new medicatio (Zyprexa) HIV follow up care.     HPI: Lee Bridges is here with his mother today for support. Working with [employer]. Has been doing very well getting in the biktarvy doses everyday. Excited to talk about his immune system and how well he is doing. No side effects to the medication that he can tell.   He says he has been seeing things - mostly people that are not there. Does not try to interact with him at all. This has been happening maybe every other day. He says that he hides from them and they go away. He worries it is due to the zyprexa medication that his mental health team started him on; due to this he takes it intermittently. He is trying to keep a journal of his symptoms. He is sleeping well for the most part.    Depression screen PHQ 2/9 03/20/2021  Decreased Interest 0  Down, Depressed, Hopeless 0  PHQ - 2 Score 0  Altered sleeping -  Tired, decreased energy -  Change in appetite -  Feeling bad or failure about yourself  -  Trouble concentrating -  Moving slowly or fidgety/restless -  Suicidal thoughts -  PHQ-9 Score -  Difficult doing work/chores -  Some encounter information is confidential and restricted. Go to Review Flowsheets activity to see all data.     Review of Systems  Constitutional:  Negative for chills, fever, malaise/fatigue and weight loss.  HENT:  Negative for sore throat.        No dental problems  Respiratory:  Negative for cough and sputum production.    Cardiovascular:  Negative for chest pain and leg swelling.  Gastrointestinal:  Negative for abdominal pain, diarrhea and vomiting.  Genitourinary:  Negative for dysuria and flank pain.  Musculoskeletal:  Negative for joint pain, myalgias and neck pain.  Skin:  Negative for rash.  Neurological:  Negative for dizziness, tingling and headaches.  Psychiatric/Behavioral:  Positive for depression and hallucinations. Negative for suicidal ideas. The patient is not nervous/anxious and does not have insomnia.   All other systems reviewed and are negative.  Past Medical History:  Diagnosis Date   HIV (human immunodeficiency virus infection) ([place])     Outpatient Medications Prior to Visit  Medication Sig Dispense Refill   bictegravir-emtricitabine-tenofovir AF (BIKTARVY) 50-200-25 MG TABS tablet Take 1 tablet by mouth daily. Try to take at the same time each day with or without food. (Patient not taking: Reported on 04/05/2021) 14 tablet 0   escitalopram (LEXAPRO) 20 MG tablet Take 1 tablet (20 mg total) by mouth daily. 30 tablet 3   OLANZapine (ZYPREXA) 10 MG tablet Take 1 tablet (10 mg total) by mouth at bedtime. 30 tablet 1   sulfamethoxazole-trimethoprim (BACTRIM) 400-80 MG tablet Take 1 tablet by mouth daily. 30 tablet 2   traZODone (DESYREL) 50 MG tablet Take 1 tablet (50 mg total) by mouth at bedtime as needed for sleep.  30 tablet 3   bictegravir-emtricitabine-tenofovir AF (BIKTARVY) 50-200-25 MG TABS tablet Take 1 tablet by mouth daily. Try to take at the same time each day with or without food. 30 tablet 5   No facility-administered medications prior to visit.     Allergies  Allergen Reactions   Fish-Derived Products Hives and Nausea And Vomiting    Social History   Tobacco Use   Smoking status: Every Day    Packs/day: 0.50    Types: Cigarettes   Smokeless tobacco: Never  Substance Use Topics   Alcohol use: Yes   Drug use: Yes    Types: Cocaine    Comment: socially     Family History  Problem Relation Age of Onset   Arthritis Mother    Diabetes Father    Hypertension Father     Social History   Substance and Sexual Activity  Sexual Activity Not on file     Objective:   Vitals:   07/10/21 1138  BP: 136/76  Pulse: 86  Temp: 97.6 F (36.4 C)  TempSrc: Temporal  SpO2: 98%  Weight: 137 lb (62.1 kg)   Body mass index is 25.06 kg/m.  Physical Exam Vitals reviewed.  Constitutional:      Appearance: He is well-developed.     Comments: Seated comfortably in chair during visit.   HENT:     Mouth/Throat:     Dentition: Normal dentition. No dental abscesses.  Cardiovascular:     Rate and Rhythm: Normal rate and regular rhythm.     Heart sounds: Normal heart sounds.  Pulmonary:     Effort: Pulmonary effort is normal.     Breath sounds: Normal breath sounds.  Abdominal:     General: There is no distension.     Palpations: Abdomen is soft.     Tenderness: There is no abdominal tenderness.  Lymphadenopathy:     Cervical: No cervical adenopathy.  Skin:    General: Skin is warm and dry.     Findings: No rash.  Neurological:     Mental Status: He is alert and oriented to person, place, and time.  Psychiatric:        Judgment: Judgment normal.     Comments: In good spirits today and engaged in care discussion.     Lab Results Lab Results  Component Value Date   WBC 3.4 (L) 04/05/2021   HGB 13.2 04/05/2021   HCT 37.8 (L) 04/05/2021   MCV 98.2 04/05/2021   PLT 290 04/05/2021    Lab Results  Component Value Date   CREATININE 1.25 (H) 04/17/2021   BUN 18 04/17/2021   NA 138 04/17/2021   K 4.2 04/17/2021   CL 108 04/17/2021   CO2 23 04/17/2021    Lab Results  Component Value Date   ALT 13 04/05/2021   AST 21 04/05/2021   ALKPHOS 61 04/05/2021   BILITOT 0.9 04/05/2021    No results found for: CHOL, HDL, LDLCALC, LDLDIRECT, TRIG, CHOLHDL HIV 1 RNA Quant  Date Value  07/02/2021 48 Copies/mL (H)  04/17/2021 243  Copies/mL (H)  03/20/2021 146,000 copies/mL (H)     Assessment & Plan:   Problem List Items Addressed This Visit       Unprioritized   HIV (human immunodeficiency virus infection) ([place]) - Primary    Doing well on biktarvy once daily. We reviewed his labs from October 2022 when he started medication and viral load is down to 48 copies now. Discussed this is considered in  the "undetectable" range and what this means. Briefly discussed trajectory of untreated HIV with he and his mother and progression to AIDS. He has had a nice recovery of his immune system as of now.  Stop bactrim for prophylaxis with CD4 > 200 for 3 months.  Vaccines updated today with prevnar 20 to complete pneumococcal series and menveo #1.  RTC in 34mfor routine follow up care.        Relevant Medications   bictegravir-emtricitabine-tenofovir AF (BIKTARVY) 50-200-25 MG TABS tablet   History of syphilis    Titer has dropped from > 1:16 to 1:8. Will continue to monitor; if not 1:4 at next draw will consider re-treatment.       Hallucinations    Newly described visual hallucinations he attributes to the zyprexa. Encouraged him to continue to journal symptoms to discuss with his mental health team at upcoming visit.  Denies any SI/HI. [facility] and see if this is something to be expected with zyprexa or more a feature of behavioral health condition.       Other Visit Diagnoses     Need for pneumococcal vaccination       Relevant Orders   Pneumococcal conjugate vaccine 20-valent (Completed)   MENINGOCOCCAL MCV4O (Completed)   Need for meningitis vaccination       Relevant Orders   Pneumococcal conjugate vaccine 20-valent (Completed)   MENINGOCOCCAL MCV4O (Completed)        SJanene Madeira MSN, NP-C RCoal Creekfor IKingstonPager: [phone]: [phone] 07/11/21  3:20 PM

## 2021-07-10 NOTE — Patient Instructions (Addendum)
STOP the Bactrim   Continue the Biktarvy every day. We will see you back in 3 months with labs two weeks before. Your immune system is recovery nicely! Keep up the good work!

## 2021-07-11 ENCOUNTER — Encounter: Payer: Self-pay | Admitting: Infectious Diseases

## 2021-07-11 DIAGNOSIS — R443 Hallucinations, unspecified: Secondary | ICD-10-CM | POA: Insufficient documentation

## 2021-07-11 NOTE — Assessment & Plan Note (Addendum)
Doing well on biktarvy once daily. We reviewed his labs from October 2022 when he started medication and viral load is down to 48 copies now. Discussed this is considered in the "undetectable" range and what this means. Briefly discussed trajectory of untreated HIV with he and his mother and progression to AIDS. He has had a nice recovery of his immune system as of now.  Stop bactrim for prophylaxis with CD4 > 200 for 3 months.  Vaccines updated today with prevnar 20 to complete pneumococcal series and menveo #1.  RTC in 39m for routine follow up care.

## 2021-07-11 NOTE — Assessment & Plan Note (Addendum)
Titer has dropped from > 1:16 to 1:8. Will continue to monitor; if not 1:4 at next draw will consider re-treatment.

## 2021-07-11 NOTE — Assessment & Plan Note (Addendum)
Newly described visual hallucinations he attributes to the zyprexa. Encouraged him to continue to journal symptoms to discuss with his mental health team at upcoming visit.  Denies any SI/HI. I will reach out to his [facility] team to inform them and see if this is something to be expected with zyprexa or more a feature of behavioral health condition.

## 2021-07-16 ENCOUNTER — Ambulatory Visit: Payer: Self-pay

## 2021-07-16 ENCOUNTER — Other Ambulatory Visit: Payer: Self-pay

## 2021-07-16 NOTE — Progress Notes (Unsigned)
Therapist met with client as scheduled and discussed treatment plan/case management progress. Therapist used active listening skills to provide client with opportunity to disclose previous challenges and successes. Therapist assessed for SI/HI during session and will follow-up with client during the next session.  Client met with therapist as scheduled. Client presented alert mood and appeared to euthymic; affect was congruent with client report of mood. Client was able to make connections between consequences, challenges and alternative thoughts of mental health recovery during this session. Client talked with clinician about various issues in the community and agendas for counseling. Client was only scheduled for thirty minutes and was not able to get into his recent conflict with his mother. Case manager took a vested interest in how client was utilizing his coping skills. Client informed clinician that he will use immediate skills to address this issue. Clinician will address issues with more effective and realistic coping skills. Therapeutic goal(s) continues to remain minimal at this time. client denied SI/HI.

## 2021-07-23 ENCOUNTER — Ambulatory Visit: Payer: Self-pay

## 2021-07-23 ENCOUNTER — Other Ambulatory Visit: Payer: Self-pay

## 2021-07-24 ENCOUNTER — Ambulatory Visit (INDEPENDENT_AMBULATORY_CARE_PROVIDER_SITE_OTHER): Payer: No Payment, Other | Admitting: Physician Assistant

## 2021-07-24 ENCOUNTER — Encounter (HOSPITAL_COMMUNITY): Payer: Self-pay | Admitting: Physician Assistant

## 2021-07-24 VITALS — BP 117/74 | HR 70 | Ht 62.0 in | Wt 131.0 lb

## 2021-07-24 DIAGNOSIS — F310 Bipolar disorder, current episode hypomanic: Secondary | ICD-10-CM

## 2021-07-24 DIAGNOSIS — G4700 Insomnia, unspecified: Secondary | ICD-10-CM | POA: Diagnosis not present

## 2021-07-24 DIAGNOSIS — F411 Generalized anxiety disorder: Secondary | ICD-10-CM

## 2021-07-24 MED ORDER — ARIPIPRAZOLE 5 MG PO TABS: 5.0000 mg | ORAL_TABLET | Freq: Every day | ORAL | 1 refills | Status: DC

## 2021-07-24 MED ORDER — TRAZODONE HCL 50 MG PO TABS: 50.0000 mg | ORAL_TABLET | Freq: Every evening | ORAL | 3 refills | Status: DC | PRN

## 2021-07-24 NOTE — Progress Notes (Signed)
BH MD/PA/NP OP Progress Note  07/24/2021 11:20 AM Lee Bridges  MRN:  [id number]  Chief Complaint:  Chief Complaint  Patient presents with   Follow-up   Medication Management    in person, f/u mm   HPI:   Lee Bridges "Lee Bridges" is a 31 year old male with a past psychiatric history significant for major depressive disorder, unspecified mood affective disorder, and generalized anxiety disorder who presents to [facility] for follow-up and medication management.  Patient is currently being managed on the following medications:  Olanzapine 10 mg at bedtime Lexapro 20 mg daily Trazodone 50 mg at bedtime  Patient reports that he has been experiencing nightmares and seeing people. He states that he last experienced these symptoms roughly 20 days ago.  Patient's nightmares are characterized by him being chased down the street to be killed.  Patient reports that the individuals chasing him in his dreams end up appearing to him outside of his dream.  He reports that the nightmares stopped when discontinuing olanzapine.  Patient denies any major life changing events and further denies recent substance abuse.  Patient reports that his sleep is still well managed with trazodone.  Patient endorses some depression characterized by the following symptoms: decreased energy, fatigue, decreased concentration, and sadness that comes and goes.  Patient also endorses mood swings and states that he goes from 0-100 real quick.  In addition to his mood swings, patient endorses racing thoughts and difficulty with keeping his thoughts focused.  Patient states that his anxiety is bad and rates his anxiety as 7 or 8 out of 10.  When taking his olanzapine, patient states that he experienced sleep disturbances, worsening depression, and suicidal thoughts.  A PHQ-9 screen was performed with the patient scoring a 20.  A GAD-7 screen was also performed with the patient scoring a 16.  A  [place] Suicide Severity Rating Scale was performed with the patient be considered moderate risk.  Patient is alert and oriented x4, calm, cooperative, and fully engaged in conversation during the encounter.  Patient states that he is currently relaxed and calm.  He reports that he did take his Lexapro today without issue.  Patient denies suicidal or homicidal ideations.  He further denies auditory or visual hallucinations and does not appear to be responding to internal/external stimuli.  Patient endorses fair sleep and receives on average 6 hours of sleep each night.  Patient endorses occasional binge eating when taking olanzapine.  Patient endorses eating on average 2 meals per day.  Patient denies alcohol consumption, tobacco use, and illicit drug use.  Visit Diagnosis:    ICD-10-CM   1. Bipolar affective disorder, current episode hypomanic (HCC)  F31.0 ARIPiprazole (ABILIFY) 5 MG tablet    DISCONTINUED: ARIPiprazole (ABILIFY) 5 MG tablet    2. Generalized anxiety disorder  F41.1 traZODone (DESYREL) 50 MG tablet    3. Insomnia, unspecified type  G47.00 traZODone (DESYREL) 50 MG tablet      Past Psychiatric History:  Unspecified mood disorder Major depressive disorder Generalized anxiety disorder  Past Medical History:  Past Medical History:  Diagnosis Date   HIV (human immunodeficiency virus infection) (HCC)    History reviewed. No pertinent surgical history.  Family Psychiatric History:  Patient reports that psychiatric illnesses run on his mother side of the family.   Cousin - Bipolar disorder/mood swings Cousin - Schizophrenia  Family History:  Family History  Problem Relation Age of Onset   Arthritis Mother    Diabetes Father  Hypertension Father     Social History:  Social History   Socioeconomic History   Marital status: Single    Spouse name: Not on file   Number of children: Not on file   Years of education: Not on file   Highest education level: Not on  file  Occupational History   Not on file  Tobacco Use   Smoking status: Every Day    Packs/day: 0.50    Types: Cigarettes   Smokeless tobacco: Never  Substance and Sexual Activity   Alcohol use: Yes   Drug use: Yes    Types: Cocaine    Comment: socially   Sexual activity: Not on file  Other Topics Concern   Not on file  Social History Narrative   Lives with mother.    Social Determinants of Health   Financial Resource Strain: Not on file  Food Insecurity: Not on file  Transportation Needs: Not on file  Physical Activity: Not on file  Stress: Not on file  Social Connections: Not on file    Allergies:  Allergies  Allergen Reactions   Fish-Derived Products Hives and Nausea And Vomiting    Metabolic Disorder Labs: No results found for: HGBA1C, MPG No results found for: PROLACTIN No results found for: CHOL, TRIG, HDL, CHOLHDL, VLDL, LDLCALC No results found for: TSH  Therapeutic Level Labs: No results found for: LITHIUM No results found for: VALPROATE No components found for:  CBMZ  Current Medications: Current Outpatient Medications  Medication Sig Dispense Refill   ARIPiprazole (ABILIFY) 5 MG tablet Take 1 tablet (5 mg total) by mouth daily. 30 tablet 1   bictegravir-emtricitabine-tenofovir AF (BIKTARVY) 50-200-25 MG TABS tablet Take 1 tablet by mouth daily. Try to take at the same time each day with or without food. (Patient not taking: Reported on 04/05/2021) 14 tablet 0   bictegravir-emtricitabine-tenofovir AF (BIKTARVY) 50-200-25 MG TABS tablet Take 1 tablet by mouth daily. Try to take at the same time each day with or without food. 30 tablet 5   escitalopram (LEXAPRO) 20 MG tablet Take 1 tablet (20 mg total) by mouth daily. 30 tablet 3   sulfamethoxazole-trimethoprim (BACTRIM) 400-80 MG tablet Take 1 tablet by mouth daily. 30 tablet 2   traZODone (DESYREL) 50 MG tablet Take 1 tablet (50 mg total) by mouth at bedtime as needed for sleep. 30 tablet 3   No  current facility-administered medications for this visit.     Musculoskeletal: Strength & Muscle Tone: within normal limits Gait & Station: normal Patient leans: N/A  Psychiatric Specialty Exam: Review of Systems  Psychiatric/Behavioral:  Positive for decreased concentration and sleep disturbance. Negative for agitation, dysphoric mood, hallucinations, self-injury and suicidal ideas. The patient is nervous/anxious. The patient is not hyperactive.    Blood pressure 117/74, pulse 70, height 5\' 2"  (1.575 m), weight 131 lb (59.4 kg).Body mass index is 23.96 kg/m.  General Appearance: Well Groomed  Eye Contact:  Good  Speech:  Clear and Coherent and Normal Rate  Volume:  Normal  Mood:  Anxious and Depressed  Affect:  Congruent  Thought Process:  Coherent, Goal Directed, and Descriptions of Associations: Intact  Orientation:  Full (Time, Place, and Person)  Thought Content: WDL   Suicidal Thoughts:  No  Homicidal Thoughts:  No  Memory:  Immediate;   Good Recent;   Good Remote;   Good  Judgement:  Good  Insight:  Fair  Psychomotor Activity:  Normal  Concentration:  Concentration: Good and Attention Span: Good  Recall:  Good  Fund of Knowledge: Good  Language: Good  Akathisia:  No  Handed:  Right  AIMS (if indicated): not done  Assets:  Communication Skills Desire for Improvement Housing Social Support  ADL's:  Intact  Cognition: WNL  Sleep:  Fair   Screenings: GAD-7    Flowsheet Row [facility]  Total GAD-7 Score 16 2 19       PHQ2-9    Flowsheet Row [facility]  [facility]  PHQ-2 Total Score 5 2 5  0 4  PHQ-9 Total Score 20 15 23  -- 12      Flowsheet Row [facility]  C-SSRS RISK CATEGORY Moderate Risk Moderate Risk Moderate Risk        Assessment and Plan:   Lee KaufmanDon Bridges "Lee Bridges" is a 31 year old male with a past psychiatric history significant for major depressive disorder, unspecified mood affective disorder, and generalized anxiety disorder [facility] and medication management.  Patient reports that he was experiencing visual hallucinations and nightmares when taking olanzapine.  Patient has since discontinued olanzapine and has not experienced the aforementioned symptoms.  Patient also endorses mood swings, irritability, and racing thoughts.  Patient's current symptoms are highly suggestive of bipolar disorder with current depressive episode.  Patient was recommended being placed on Abilify 5 mg daily for the management of his symptoms.  Patient was agreeable to recommendation.  Patient to continue taking all of them patient as prescribed.  Patient's medications to be e-prescribed to [facility].  Collaboration of Care: Collaboration of Care: Medication Management AEB provider is managing patient's psychiatric medications, Psychiatrist AEB patient being seen by mental health provider, Other provider involved in patient's care AEB patient is being followed by infectious diseases, and Referral or follow-up with counselor/therapist AEB patient is enrolled in counseling and is seen by Roger Shelterarol M. Shepherd, LCSW.  Patient/Guardian was advised Release of  Information must be obtained prior to any record release in order to collaborate their care with an outside provider. Patient/Guardian was advised if they have not already done so to contact the registration department to sign all necessary forms in order for us to release information regarding their care.   Consent: Patient/Guardian gives verbal consent for treatment and assignment of benefits for services provided during this visit. Patient/Guardian expressed understanding and agreed to proceed.   1. Bipolar affective disorder, current episode hypomanic (HCC)  - ARIPiprazole (ABILIFY) 5 MG tablet; Take 1 tablet (5 mg total) by mouth daily.  Dispense: 30 tablet; Refill: 1  2. Generalized anxiety disorder Patient to continue taking escitalopram 20 mg daily for the management of his generalized anxiety disorder  - traZODone (DESYREL) 50 MG tablet; Take 1 tablet (50 mg total) by mouth at bedtime as needed for sleep.  Dispense: 30 tablet; Refill: 3  3. Insomnia, unspecified type  - traZODone (DESYREL) 50 MG tablet;  Take 1 tablet (50 mg total) by mouth at bedtime as needed for sleep.  Dispense: 30 tablet; Refill: 3  Patient to follow up in 5 weeks Provider spent a total of 25 minutes with the patient/reviewing patient's chart  Meta Hatchet, PA 07/24/2021, 11:20 AM

## 2021-07-28 NOTE — Progress Notes (Unsigned)
Therapist met with client as scheduled and discussed treatment plan/case management progress. Therapist used active listening skills to provide client with opportunity to disclose previous challenges and successes. Therapist assessed for SI/HI during session and will follow-up with client during the next session.  Client met with therapist as scheduled. Client presented alert mood and appeared to euthymic; affect was congruent with client report of mood. Client continues to express their needs in the development of treatment goals and described different communities of which can serve an added support for them. Client informed clinician that his old job called and asked for his return. Client expressed how this would be an opportunity for him to get back up on his feet; not only was he making the money he wanted but he was happy there. Client talked about how he felt disrespected and requested that clinician provide him skills on how to advocate for himself. Clinician informed client that he is always to be respected and that she would advocate for him.  Progress toward therapeutic goal(s) continues to remain minimal at this time. Client denied SI/HI.

## 2021-08-03 ENCOUNTER — Ambulatory Visit (INDEPENDENT_AMBULATORY_CARE_PROVIDER_SITE_OTHER): Payer: Self-pay | Admitting: Infectious Diseases

## 2021-08-03 ENCOUNTER — Other Ambulatory Visit: Payer: Self-pay

## 2021-08-03 ENCOUNTER — Encounter: Payer: Self-pay | Admitting: Infectious Diseases

## 2021-08-03 DIAGNOSIS — R634 Abnormal weight loss: Secondary | ICD-10-CM

## 2021-08-03 DIAGNOSIS — M542 Cervicalgia: Secondary | ICD-10-CM

## 2021-08-03 DIAGNOSIS — R443 Hallucinations, unspecified: Secondary | ICD-10-CM

## 2021-08-03 NOTE — Patient Instructions (Addendum)
I would like for you to call the psychiatry team to let them  ? ?I want you to consider getting a scale at home to start weighing yourself everyday - I think all of your symptoms are due to the medication switches lately impacting your taste and appetite.  ? ?Diclofenac (Voltren) is a great option for your pain  ?Also I would recommend a more firm pillow that is better support for your neck to not be so "hyper flexed back". That puts un-natural pressure on the neck that I think may be causing your pain.  ?

## 2021-08-03 NOTE — Progress Notes (Signed)
Name: Lee Bridges  DOB: Oct 07, 1990 MRN: [id number] PCP: Pcp, No    Brief Narrative:  Bode Pieper is a 31 y.o. male with HIV disease, Dx 10/17/2014. Never on medications.  CD4 nadir 180 VL unknown HIV Risk: sexual  History of OIs: none Intake Labs 03/20/21: Hep B sAg (-), sAb (-), cAb (-); Hep A (-), Hep C (-) Quantiferon () HLA B*5701 () G6PD: ()   Previous Regimens: Biktarvy    Genotypes: Pending   Subjective:   Chief Complaint  Patient presents with   Follow-up    Knot on neck for two months, loss of appetite       HPI: Lee Bridges is here for a few concerns - weight loss that he feels is unintentional, neck pain and swollen painful lumps under the arms.   Wt Readings from Last 3 Encounters:  08/03/21 126 lb (57.2 kg)  07/24/21 131 lb (59.4 kg)  07/10/21 137 lb (62.1 kg)   Most concerning is the weight loss - Goes 2-3 days at a time without eating then over-eats when he is reminded about looking thin and not eating a lot. Last OV he reported different experience and was over eating to the point of vomiting on Olanzapine with rapid weight gain on that. Taken off Olanzapine and started on Abilify. Appetite is way down. Does have a lot of burping after he eats and a very poor taste in his mouth that just won't go away. He is very worried that the weight loss is due to HIV advancing.  Continues his Biktarvy everyday without many missed doses (if any).   Stopped trazodone d/t nightmares. Trentellix was very helpful for him and he hopes he can resume that because he feels that he has been suicidal since his last mental health. Feels very down and depressed and unfortunately has had increasing suicidal thoughts since recent medication changes. Has started contemplating planning loosely but nothing to verbalize today.   Neck pain that has been present since November 2022. Had CT scan of Tspine and no problems noted to bones/joints. He has point tenderness over nape of the neck.  Says he sleeps with neck hyperextended back on his pillow. Unclear when he changed pillow out in the past. No fevers, chills or altered ROM. Has not had much relief from occasional OTC analgesics.    Depression screen [facility] 2/9 07/24/2021  Decreased Interest 2  Down, Depressed, Hopeless 3  PHQ - 2 Score 5  Altered sleeping 3  Tired, decreased energy 3  Change in appetite 1  Feeling bad or failure about yourself  3  Trouble concentrating 3  Moving slowly or fidgety/restless 2  Suicidal thoughts 0  PHQ-9 Score 20  Difficult doing work/chores Somewhat difficult     Review of Systems  Constitutional:  Positive for weight loss. Negative for chills, fever and malaise/fatigue.  HENT:  Negative for sore throat.        No dental problems  Respiratory:  Negative for cough and sputum production.   Cardiovascular:  Negative for chest pain and leg swelling.  Gastrointestinal:  Negative for abdominal pain, diarrhea and vomiting.  Genitourinary:  Negative for dysuria and flank pain.  Musculoskeletal:  Positive for neck pain. Negative for joint pain and myalgias.  Skin:  Negative for rash.  Neurological:  Negative for dizziness, tingling and headaches.  Psychiatric/Behavioral:  Positive for depression and suicidal ideas. Negative for hallucinations. The patient is not nervous/anxious and does not have insomnia.   All other  systems reviewed and are negative.  Past Medical History:  Diagnosis Date   HIV (human immunodeficiency virus infection) ([facility])     Outpatient Medications Prior to Visit  Medication Sig Dispense Refill   ARIPiprazole (ABILIFY) 5 MG tablet Take 1 tablet (5 mg total) by mouth daily. 30 tablet 1   bictegravir-emtricitabine-tenofovir AF (BIKTARVY) 50-200-25 MG TABS tablet Take 1 tablet by mouth daily. Try to take at the same time each day with or without food. 14 tablet 0   bictegravir-emtricitabine-tenofovir AF (BIKTARVY) 50-200-25 MG TABS tablet Take 1 tablet by mouth daily.  Try to take at the same time each day with or without food. 30 tablet 5   chlorhexidine (PERIDEX) 0.12 % solution 15 mLs 2 (two) times daily.     hydrOXYzine (ATARAX) 25 MG tablet Take 25 mg by mouth 3 (three) times daily.     PREVIDENT 5000 PLUS 1.1 % CREA dental cream SMARTSIG:Sparingly By Mouth Every Evening     traZODone (DESYREL) 50 MG tablet Take 1 tablet (50 mg total) by mouth at bedtime as needed for sleep. 30 tablet 3   escitalopram (LEXAPRO) 20 MG tablet Take 1 tablet (20 mg total) by mouth daily. (Patient not taking: Reported on 08/03/2021) 30 tablet 3   sulfamethoxazole-trimethoprim (BACTRIM) 400-80 MG tablet Take 1 tablet by mouth daily. (Patient not taking: Reported on 08/03/2021) 30 tablet 2   No facility-administered medications prior to visit.     Allergies  Allergen Reactions   Fish-Derived Products Hives and Nausea And Vomiting    Social History   Tobacco Use   Smoking status: Every Day    Packs/day: 0.50    Types: Cigarettes   Smokeless tobacco: Never  Substance Use Topics   Alcohol use: Yes   Drug use: Yes    Types: Cocaine    Comment: socially    Social History   Substance and Sexual Activity  Sexual Activity Not on file     Objective:   Vitals:   08/03/21 1426  BP: 127/82  Pulse: 93  Temp: 99.1 F (37.3 C)  TempSrc: Temporal  SpO2: 98%  Weight: 126 lb (57.2 kg)   Body mass index is 23.05 kg/m.  Physical Exam Vitals reviewed.  Constitutional:      Appearance: He is well-developed.     Comments: Seated comfortably in chair during visit.   HENT:     Mouth/Throat:     Dentition: Normal dentition. No dental abscesses.  Neck:      Comments: TTP over vertebra with moderate pressure. No surrounding muscle edema. No spasms. No altered ROM. Skin is normal in appearance, color, temperature.  Cardiovascular:     Rate and Rhythm: Normal rate and regular rhythm.     Heart sounds: Normal heart sounds.  Pulmonary:     Effort: Pulmonary effort is  normal.     Breath sounds: Normal breath sounds.  Abdominal:     General: There is no distension.     Palpations: Abdomen is soft.     Tenderness: There is no abdominal tenderness.  Musculoskeletal:     Cervical back: No edema, erythema or rigidity. Spinous process tenderness present. No pain with movement or muscular tenderness. Normal range of motion.  Lymphadenopathy:     Cervical: No cervical adenopathy.     Right cervical: No superficial cervical adenopathy.    Left cervical: No superficial cervical adenopathy.  Skin:    General: Skin is warm and dry.     Findings: No rash.  Neurological:     Mental Status: He is alert and oriented to person, place, and time.  Psychiatric:        Judgment: Judgment normal.     Comments: Anxious    Lab Results Lab Results  Component Value Date   WBC 3.4 (L) 04/05/2021   HGB 13.2 04/05/2021   HCT 37.8 (L) 04/05/2021   MCV 98.2 04/05/2021   PLT 290 04/05/2021    Lab Results  Component Value Date   CREATININE 1.25 (H) 04/17/2021   BUN 18 04/17/2021   NA 138 04/17/2021   K 4.2 04/17/2021   CL 108 04/17/2021   CO2 23 04/17/2021    Lab Results  Component Value Date   ALT 13 04/05/2021   AST 21 04/05/2021   ALKPHOS 61 04/05/2021   BILITOT 0.9 04/05/2021    No results found for: CHOL, HDL, LDLCALC, LDLDIRECT, TRIG, CHOLHDL HIV 1 RNA Quant  Date Value  07/02/2021 48 Copies/mL (H)  04/17/2021 243 Copies/mL (H)  03/20/2021 146,000 copies/mL (H)     Assessment & Plan:   Problem List Items Addressed This Visit       Unprioritized   Hallucinations    Resolved since last OV with medication changes      Weight loss, unintentional    Wt Readings from Last 3 Encounters:  08/03/21 126 lb (57.2 kg)  07/24/21 131 lb (59.4 kg)  07/10/21 137 lb (62.1 kg)  Overall happy with current weight but fearful he may continue to trend down and look sick.  Spent a lot of time discussing this and the timeline of significant changes in weight.  I reassured him that this is not an indicator of advancing HIV. All of his symptoms correlate with changes to mental health medications. He seems to at present have dysgusia possibly a/w Abilify that is really contributing to his poor appetite. ?if mirtazapine may be helpful for him at night - will reach out to psychiatry team to get their opinion on Odes's concerns as it relates to his current medications. Either way, he reports increasing suicidal thoughts and we discussed how important this is for him to communicate with mental health team. He has given me permission to share this with bridge counselor and to reach out to psychiatry.   We also dicussed plan to get a scale at home for more consistent morning weights, prioritizing palatable small volume snacks he can tolerate throughout the day to avoid binge eating behavior. He does not have any h/o disordered eating from what we discussed.       Neck pain    Exam is unremarkable aside from some tenderness overlying spinous processes around C6. Persistent symptoms for a few months. I have a strong feeling it is exacerbated by sleep position and unsupportive pillow. Suggested to get a firmer/fuller pillow to get neck in a more neutral position and try topical Diclofenac cream up to 4x a day for relief here.  Gentle ROM stretching as he can tolerate.       Will have him back in 71mfor a weight check and regular HIV care.   Total encounter time: 30 min discussing and examining patient for above concerns  SJanene Madeira MSN, NP-C RNorthern Arizona Healthcare Orthopedic Surgery Center LLCfor IMonturaPager: [phone]: [phone] 08/04/21  8:40 AM

## 2021-08-04 ENCOUNTER — Ambulatory Visit: Payer: Self-pay

## 2021-08-04 DIAGNOSIS — R634 Abnormal weight loss: Secondary | ICD-10-CM | POA: Insufficient documentation

## 2021-08-04 DIAGNOSIS — M542 Cervicalgia: Secondary | ICD-10-CM | POA: Insufficient documentation

## 2021-08-04 NOTE — Assessment & Plan Note (Signed)
Resolved since last OV with medication changes ?

## 2021-08-04 NOTE — Assessment & Plan Note (Addendum)
Wt Readings from Last 3 Encounters:  ?08/03/21 126 lb (57.2 kg)  ?07/24/21 131 lb (59.4 kg)  ?07/10/21 137 lb (62.1 kg)  ? ?Overall happy with current weight but fearful he may continue to trend down and look sick.  Spent a lot of time discussing this and the timeline of significant changes in weight. I reassured him that this is not an indicator of advancing HIV. All of his symptoms correlate with changes to mental health medications. He seems to at present have dysgusia possibly a/w Abilify that is really contributing to his poor appetite. ?if mirtazapine may be helpful for him at night - will reach out to psychiatry team to get their opinion on Lee Bridges's concerns as it relates to his current medications. Either way, he reports increasing suicidal thoughts and we discussed how important this is for him to communicate with mental health team. He has given me permission to share this with bridge counselor and to reach out to psychiatry.  ? ?We also dicussed plan to get a scale at home for more consistent morning weights, prioritizing palatable small volume snacks he can tolerate throughout the day to avoid binge eating behavior. He does not have any h/o disordered eating from what we discussed.  ?

## 2021-08-04 NOTE — Assessment & Plan Note (Signed)
Exam is unremarkable aside from some tenderness overlying spinous processes around C6. Persistent symptoms for a few months. I have a strong feeling it is exacerbated by sleep position and unsupportive pillow. Suggested to get a firmer/fuller pillow to get neck in a more neutral position and try topical Diclofenac cream up to 4x a day for relief here.  ?Gentle ROM stretching as he can tolerate.  ?

## 2021-08-11 ENCOUNTER — Ambulatory Visit: Payer: Self-pay

## 2021-08-13 IMAGING — DX DG CHEST 1V
1 series · 1 of 1 positions shown · non-contrast
Comparison: None.

CLINICAL DATA: Vomiting, HIV

EXAM:
CHEST  1 VIEW

[chest pa]
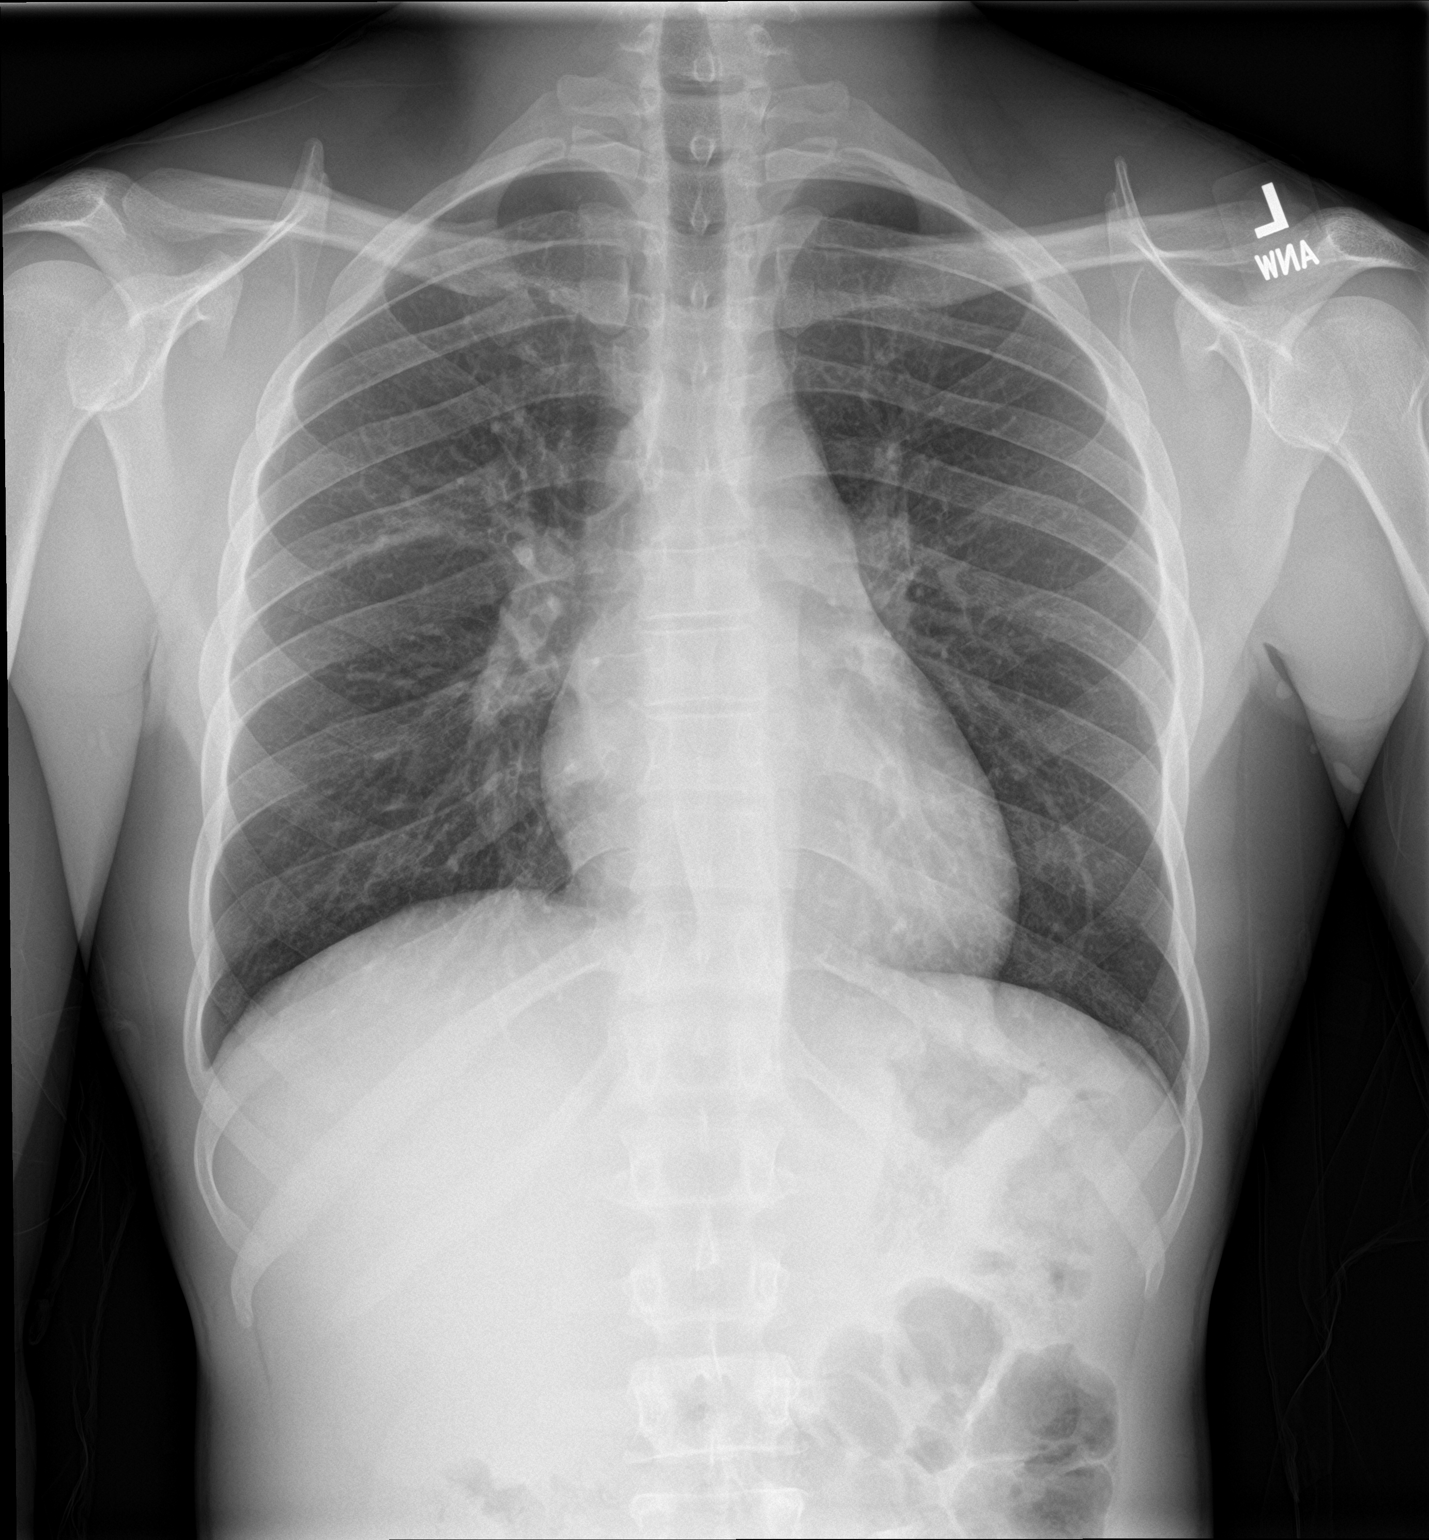

[1 of 1 positions shown; findings below may reference images not displayed]

FINDINGS: The heart size and mediastinal contours are within normal limits.
Both lungs are clear. The visualized skeletal structures are
unremarkable.
IMPRESSION: Negative.

## 2021-08-13 IMAGING — CT CT ABD-PELV W/O CM
2 of 4 series · 16 of 46 positions shown, 18 images · non-contrast
Comparison: 02/01/2017.

CLINICAL DATA: Acute, non localized abdominal pain with vomiting
and diarrhea. History of HIV.

EXAM:
CT ABDOMEN AND PELVIS WITHOUT CONTRAST
TECHNIQUE: Multidetector CT imaging of the abdomen and pelvis was performed
following the standard protocol without IV contrast.

[Series 3: abd/ pelvis 5.0 i30f 2 · axial · 0.65mm/px · z∈[+880,+1244]mm · 13 of 81 slices shown, 15 images]
[im 4/81  soft-tissue]
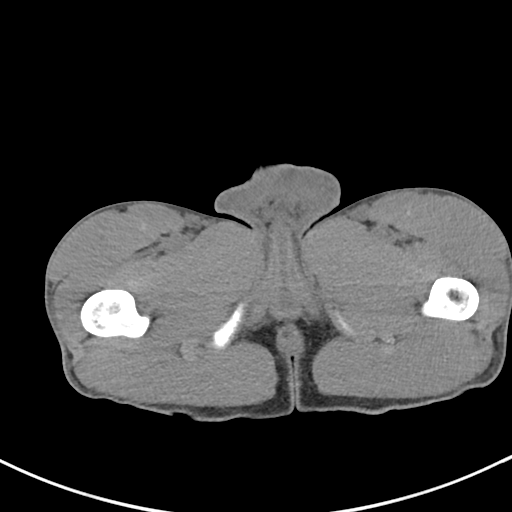
[im 4/81  bone]
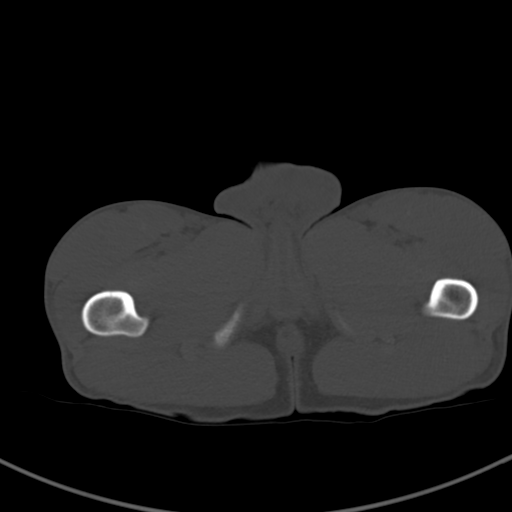
[im 11/81  soft-tissue]
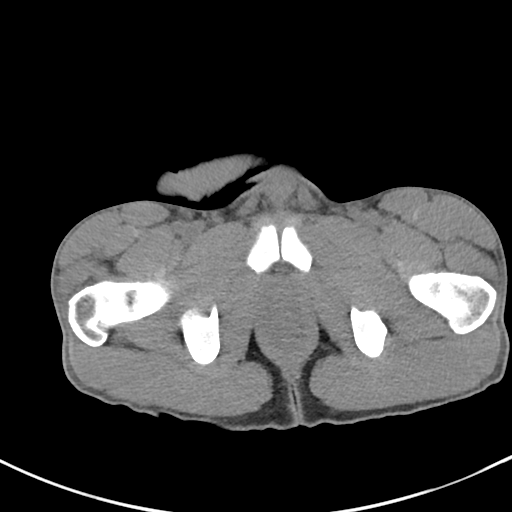
[im 18/81  soft-tissue]
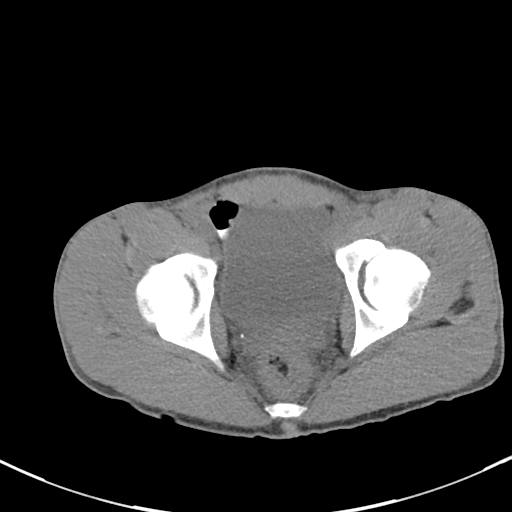
[im 21/81  soft-tissue]
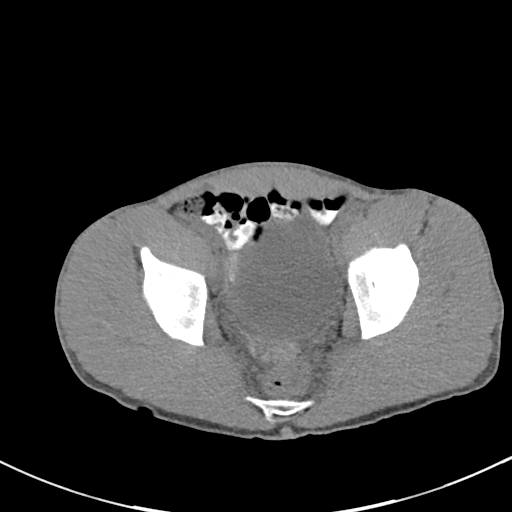
[im 28/81  soft-tissue]
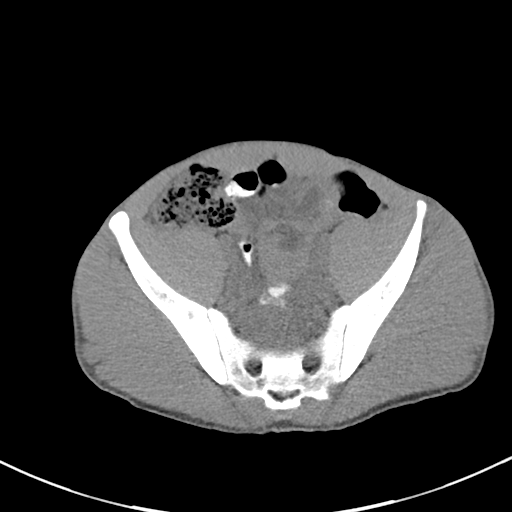
[im 35/81  soft-tissue]
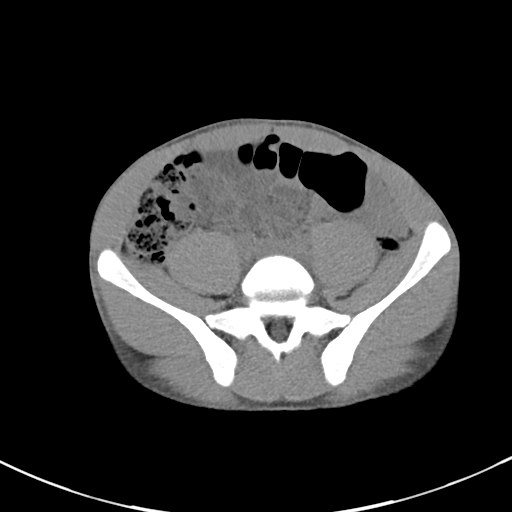
[im 42/81  soft-tissue]
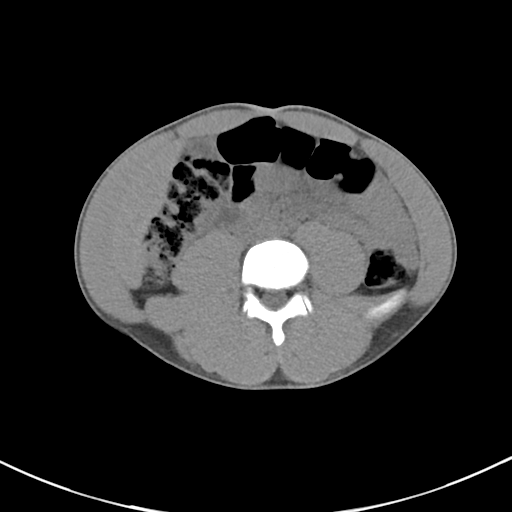
[im 46/81  soft-tissue]
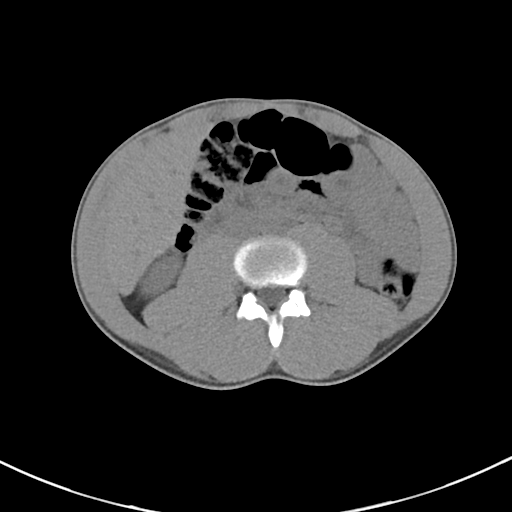
[im 53/81  soft-tissue]
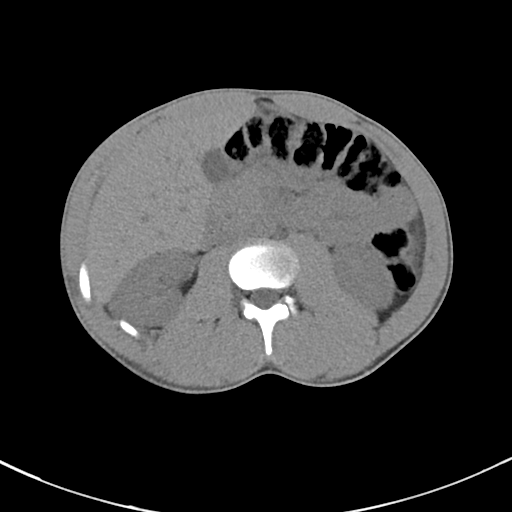
[im 53/81  bone]
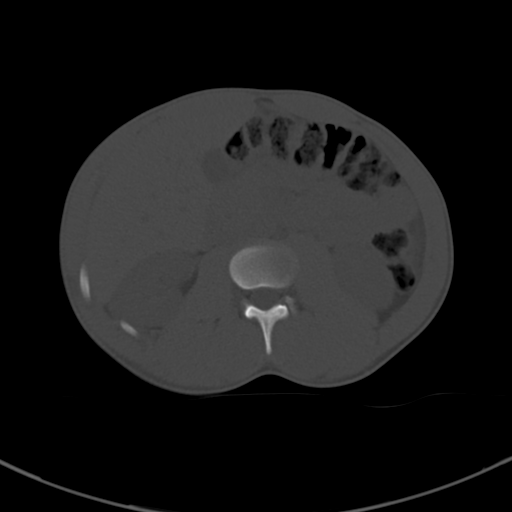
[im 60/81  soft-tissue]
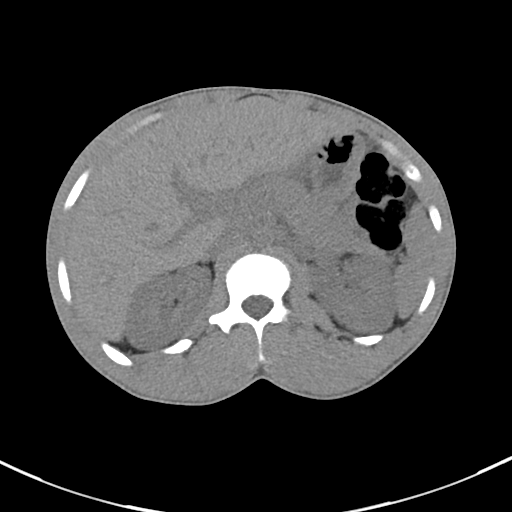
[im 63/81  soft-tissue]
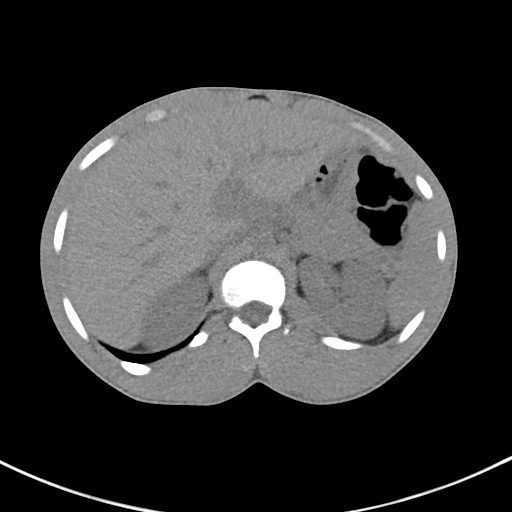
[im 70/81  soft-tissue]
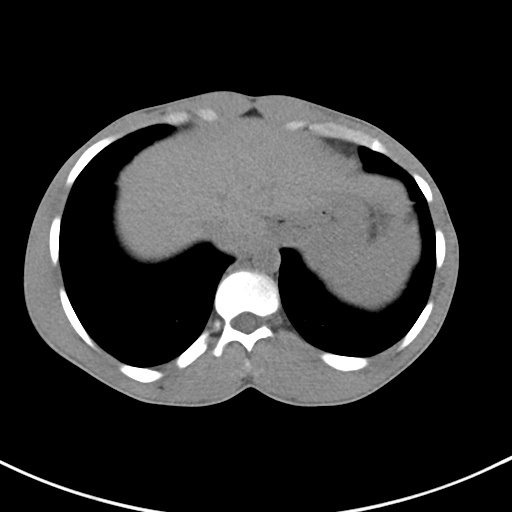
[im 77/81  soft-tissue]
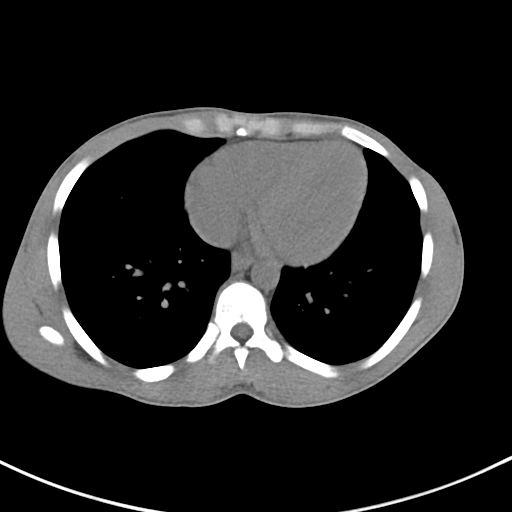

[Series 6: cor st · coronal · 0.64mm/px · 3 of 90 slices shown]
[im 30/90  soft-tissue]
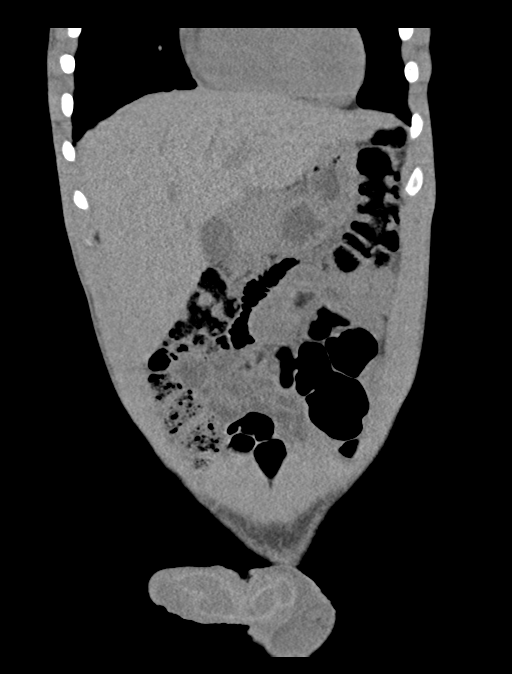
[im 40/90  soft-tissue]
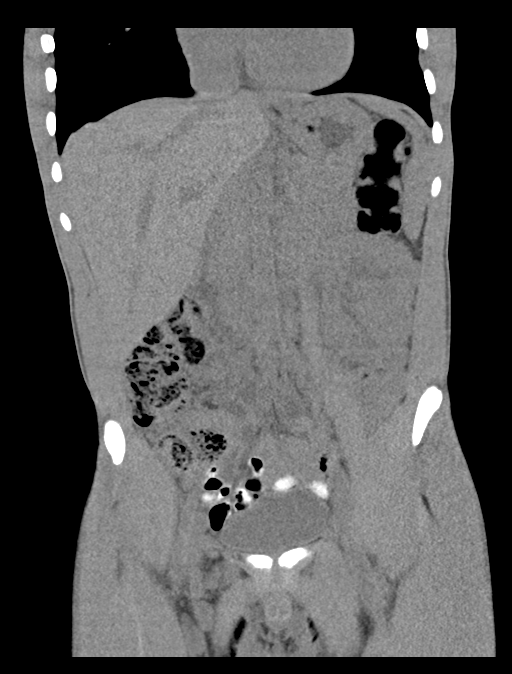
[im 50/90  soft-tissue]
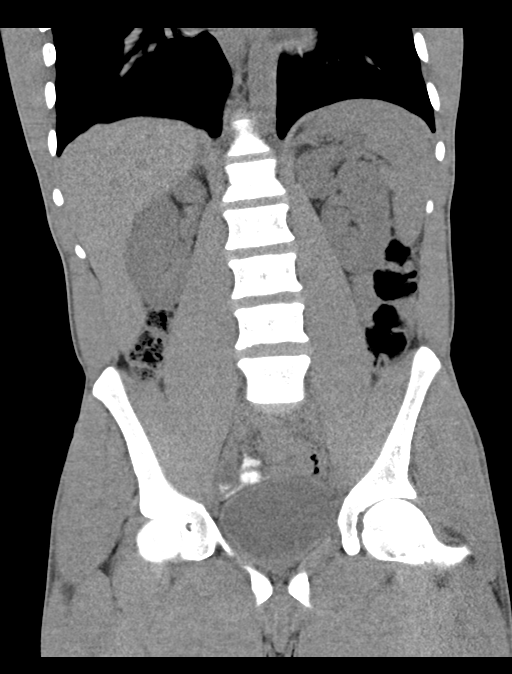

[16 of 46 positions shown; findings below may reference images not displayed]

FINDINGS: Lower chest: Unremarkable.

Hepatobiliary: No focal liver abnormality is seen. No gallstones,
gallbladder wall thickening, or biliary dilatation.

Pancreas: Unremarkable. No pancreatic ductal dilatation or
surrounding inflammatory changes.

Spleen: Normal in size without focal abnormality.

Adrenals/Urinary Tract: Adrenal glands are unremarkable. Kidneys are
normal, without renal calculi, focal lesion, or hydronephrosis.
Bladder is unremarkable.

Stomach/Bowel: Unremarkable stomach, small bowel and colon. Possible
moderate concentric wall thickening involving the inferior rectum.
No evidence of appendicitis.

Vascular/Lymphatic: No significant vascular findings are present. No
enlarged abdominal or pelvic lymph nodes.

Reproductive: Prostate is unremarkable.

Other: No abdominal wall hernia or abnormality. No abdominopelvic
ascites.

Musculoskeletal: Normal appearing bones.
IMPRESSION: 1. Possible concentric inferior rectal wall thickening. This could
be normal due to lack of distension or reflect proctitis.
2. Otherwise, unremarkable examination.

## 2021-08-25 ENCOUNTER — Ambulatory Visit: Payer: Self-pay

## 2021-08-28 ENCOUNTER — Telehealth (INDEPENDENT_AMBULATORY_CARE_PROVIDER_SITE_OTHER): Payer: No Payment, Other | Admitting: Physician Assistant

## 2021-08-28 DIAGNOSIS — F310 Bipolar disorder, current episode hypomanic: Secondary | ICD-10-CM

## 2021-08-28 MED ORDER — ARIPIPRAZOLE 5 MG PO TABS: 10.0000 mg | ORAL_TABLET | Freq: Every day | ORAL | 2 refills | Status: DC

## 2021-08-28 NOTE — Progress Notes (Addendum)
BH MD/PA/NP OP Progress Note ? ?Virtual Visit via Video Note ? ?I connected with Lee Bridges on 03/321/23 at  3:30 PM EDT by a video enabled telemedicine application and verified that I am speaking with the correct person using two identifiers. ? ?Location: ?Patient: Home ?Provider: Clinic ?  ?I discussed the limitations of evaluation and management by telemedicine and the availability of in person appointments. The patient expressed understanding and agreed to proceed. ? ?Follow Up Instructions: ? ?I discussed the assessment and treatment plan with the patient. The patient was provided an opportunity to ask questions and all were answered. The patient agreed with the plan and demonstrated an understanding of the instructions. ?  ?The patient was advised to call back or seek an in-person evaluation if the symptoms worsen or if the condition fails to improve as anticipated. ? ?I provided 15 minutes of non-face-to-face time during this encounter. ? ?Meta Hatchet, PA ? ? ?08/28/2021 3:58 PM ?Lee Bridges  ?MRN:  [id number] ? ?Chief Complaint:  ?Chief Complaint  ?Patient presents with  ? Follow-up  ? Medication Management  ? ?HPI:  ? ?Lee Prom "[name]" is a 30 year old male with a past psychiatric history significant for bipolar disorder who presents to [facility] via virtual video visit for follow-up and medication management.  Patient is currently being managed on the following medications: ? ?Abilify 5 mg daily ?Escitalopram 20 mg daily ? ?Patient states that he has only been taking Abilify 5 mg daily for the management of his symptoms and states that the medication has been working well.  Patient reports that he feels very stable on the medication and has not experienced any nightmares.  Patient reports that he has not been taking trazodone to help him with sleep but expresses that Abilify makes him sleepy and, therefore, takes the medication at night.  Patient denies  experiencing depressive symptoms but does express anxiety every now and then.  Patient denies any new stressors at this time and is comfortable with taking just Abilify but expresses interest in increasing the dosage.  A GAD-7 screen was performed with the patient scoring an 8. ? ?Patient is alert and oriented x4, calm, cooperative, and fully engaged in conversation during the encounter.  Patient reports feeling very stable.  Patient denies suicidal or homicidal ideations.  He further denies auditory or visual hallucinations and does not appear to be responding to internal/external stimuli.  Patient endorses good sleep and receives on average 7 to 8 hours of sleep each night.  Patient endorses good appetite and eats on average 2 meals per day.  Patient denies alcohol consumption.  Patient endorses tobacco use and smokes on average 4 cigarettes/day.  Patient endorses illicit drug use in the form of cocaine. ? ?Visit Diagnosis:  ?  ICD-10-CM   ?1. Bipolar affective disorder, current episode hypomanic (HCC)  F31.0 ARIPiprazole (ABILIFY) 5 MG tablet  ?  ? ? ?Past Psychiatric History:  ?Unspecified mood disorder ?Major depressive disorder ?Generalized anxiety disorder ? ?Past Medical History:  ?Past Medical History:  ?Diagnosis Date  ? HIV (human immunodeficiency virus infection) ([facility])   ? No past surgical history on file. ? ?Family Psychiatric History:  ?Patient reports that psychiatric illnesses run on his mother side of the family. ?  ?Cousin - Bipolar disorder/mood swings ?Cousin - Schizophrenia ? ?Family History:  ?Family History  ?Problem Relation Age of Onset  ? Arthritis Mother   ? Diabetes Father   ? Hypertension Father   ? ? ?  Social History:  ?Social History  ? ?Socioeconomic History  ? Marital status: Single  ?  Spouse name: Not on file  ? Number of children: Not on file  ? Years of education: Not on file  ? Highest education level: Not on file  ?Occupational History  ? Not on file  ?Tobacco Use  ? Smoking  status: Every Day  ?  Packs/day: 0.50  ?  Types: Cigarettes  ? Smokeless tobacco: Never  ?Substance and Sexual Activity  ? Alcohol use: Yes  ? Drug use: Yes  ?  Types: Cocaine  ?  Comment: socially  ? Sexual activity: Not on file  ?Other Topics Concern  ? Not on file  ?Social History Narrative  ? Lives with mother.   ? ?Social Determinants of Health  ? ?Financial Resource Strain: Not on file  ?Food Insecurity: Not on file  ?Transportation Needs: Not on file  ?Physical Activity: Not on file  ?Stress: Not on file  ?Social Connections: Not on file  ? ? ?Allergies:  ?Allergies  ?Allergen Reactions  ? Fish-Derived Products Hives and Nausea And Vomiting  ? ? ?Metabolic Disorder Labs: ?No results found for: HGBA1C, MPG ?No results found for: PROLACTIN ?No results found for: CHOL, TRIG, HDL, CHOLHDL, VLDL, LDLCALC ?No results found for: TSH ? ?Therapeutic Level Labs: ?No results found for: LITHIUM ?No results found for: VALPROATE ?No components found for:  CBMZ ? ?Current Medications: ?Current Outpatient Medications  ?Medication Sig Dispense Refill  ? ARIPiprazole (ABILIFY) 5 MG tablet Take 2 tablets (10 mg total) by mouth daily. 60 tablet 2  ? bictegravir-emtricitabine-tenofovir AF (BIKTARVY) 50-200-25 MG TABS tablet Take 1 tablet by mouth daily. Try to take at the same time each day with or without food. 14 tablet 0  ? bictegravir-emtricitabine-tenofovir AF (BIKTARVY) 50-200-25 MG TABS tablet Take 1 tablet by mouth daily. Try to take at the same time each day with or without food. 30 tablet 5  ? chlorhexidine (PERIDEX) 0.12 % solution 15 mLs 2 (two) times daily.    ? escitalopram (LEXAPRO) 20 MG tablet Take 1 tablet (20 mg total) by mouth daily. (Patient not taking: Reported on 08/03/2021) 30 tablet 3  ? hydrOXYzine (ATARAX) 25 MG tablet Take 25 mg by mouth 3 (three) times daily.    ? PREVIDENT 5000 PLUS 1.1 % CREA dental cream SMARTSIG:Sparingly By Mouth Every Evening    ? sulfamethoxazole-trimethoprim (BACTRIM) 400-80 MG  tablet Take 1 tablet by mouth daily. (Patient not taking: Reported on 08/03/2021) 30 tablet 2  ? traZODone (DESYREL) 50 MG tablet Take 1 tablet (50 mg total) by mouth at bedtime as needed for sleep. 30 tablet 3  ? ?No current facility-administered medications for this visit.  ? ? ? ?Musculoskeletal: ?Strength & Muscle Tone: within normal limits ?Gait & Station: normal ?Patient leans: N/A ? ?Psychiatric Specialty Exam: ?Review of Systems  ?Psychiatric/Behavioral:  Negative for decreased concentration, dysphoric mood, hallucinations, self-injury, sleep disturbance and suicidal ideas. The patient is nervous/anxious. The patient is not hyperactive.    ?There were no vitals taken for this visit.There is no height or weight on file to calculate BMI.  ?General Appearance: Casual  ?Eye Contact:  Good  ?Speech:  Clear and Coherent and Normal Rate  ?Volume:  Normal  ?Mood:  Anxious and Euthymic  ?Affect:  Appropriate and Congruent  ?Thought Process:  Coherent, Goal Directed, and Descriptions of Associations: Intact  ?Orientation:  Full (Time, Place, and Person)  ?Thought Content: WDL   ?Suicidal Thoughts:  No  ?Homicidal Thoughts:  No  ?Memory:  Immediate;   Good ?Recent;   Good ?Remote;   Good  ?Judgement:  Good  ?Insight:  Good  ?Psychomotor Activity:  Normal  ?Concentration:  Concentration: Good and Attention Span: Good  ?Recall:  Good  ?Fund of Knowledge: Good  ?Language: Good  ?Akathisia:  No  ?Handed:  Right  ?AIMS (if indicated): not done  ?Assets:  Communication Skills ?Desire for Improvement ?Housing ?Social Support  ?ADL's:  Intact  ?Cognition: WNL  ?Sleep:  Fair  ? ?Screenings: ?GAD-7   ? ?[facility]  ?Total GAD-7 Score 8 16 2 19    ? ?  ? ?PHQ2-9   ? ?Flowsheet Row [facility]

## 2021-08-31 ENCOUNTER — Encounter (HOSPITAL_COMMUNITY): Payer: Self-pay | Admitting: Physician Assistant

## 2021-09-01 ENCOUNTER — Ambulatory Visit: Payer: Self-pay

## 2021-09-03 ENCOUNTER — Telehealth (HOSPITAL_COMMUNITY): Payer: Self-pay | Admitting: *Deleted

## 2021-09-03 NOTE — Telephone Encounter (Signed)
Call from patient wanting to speak with Eddie PA to see if he was able to follow thru on a referral for him to [facility]. Will consult Eddie re this, reviewed his last note and I dont see anything re [facility] or referrals. Will call him back. ?

## 2021-10-09 ENCOUNTER — Telehealth: Payer: Self-pay

## 2021-10-09 ENCOUNTER — Ambulatory Visit: Payer: Self-pay | Admitting: Infectious Diseases

## 2021-10-09 NOTE — Telephone Encounter (Signed)
Called patient to see if he was on his way to appointment, or wanted to reschedule. No answer. Left HIPAA-compliant voicemail requesting call back.  ? ?Binnie Kand, RN  ?

## 2021-10-10 ENCOUNTER — Other Ambulatory Visit (HOSPITAL_COMMUNITY): Payer: Self-pay | Admitting: Physician Assistant

## 2021-10-10 DIAGNOSIS — F411 Generalized anxiety disorder: Secondary | ICD-10-CM

## 2021-10-10 DIAGNOSIS — F332 Major depressive disorder, recurrent severe without psychotic features: Secondary | ICD-10-CM

## 2021-10-28 NOTE — Progress Notes (Unsigned)
Name: Lee Bridges  DOB: [birth year] MRN: [id number] PCP: Pcp, No    Brief Narrative:  Lee Bridges is a 31 y.o. male with HIV disease, Dx 10/17/2014. Never on medications.  CD4 nadir 180 VL unknown HIV Risk: sexual  History of OIs: none Intake Labs 03/20/21: Hep B sAg (-), sAb (-), cAb (-); Hep A (-), Hep C (-) Quantiferon () HLA B*5701 () G6PD: ()   Previous Regimens: Biktarvy    Genotypes: Pending   Subjective:   No chief complaint on file. HIV follow up care.    HPI:  Weight -   Wt Readings from Last 3 Encounters:  08/03/21 126 lb (57.2 kg)  07/10/21 137 lb (62.1 kg)  04/05/21 117 lb (53.1 kg)   Has been working with Psych team for medication optimization.  ***      08/28/2021    4:00 PM  Depression screen PHQ 2/9  Decreased Interest   Down, Depressed, Hopeless   PHQ - 2 Score      Information is confidential and restricted. Go to Review Flowsheets to unlock data.      ROS  Past Medical History:  Diagnosis Date   HIV (human immunodeficiency virus infection) ([place])     Outpatient Medications Prior to Visit  Medication Sig Dispense Refill   ARIPiprazole (ABILIFY) 5 MG tablet Take 2 tablets (10 mg total) by mouth daily. 60 tablet 2   bictegravir-emtricitabine-tenofovir AF (BIKTARVY) 50-200-25 MG TABS tablet Take 1 tablet by mouth daily. Try to take at the same time each day with or without food. 14 tablet 0   bictegravir-emtricitabine-tenofovir AF (BIKTARVY) 50-200-25 MG TABS tablet Take 1 tablet by mouth daily. Try to take at the same time each day with or without food. 30 tablet 5   chlorhexidine (PERIDEX) 0.12 % solution 15 mLs 2 (two) times daily.     escitalopram (LEXAPRO) 20 MG tablet Take 1 tablet (20 mg total) by mouth daily. (Patient not taking: Reported on 08/03/2021) 30 tablet 3   hydrOXYzine (ATARAX) 25 MG tablet Take 25 mg by mouth 3 (three) times daily.     PREVIDENT 5000 PLUS 1.1 % CREA dental cream SMARTSIG:Sparingly By Mouth Every  Evening     sulfamethoxazole-trimethoprim (BACTRIM) 400-80 MG tablet Take 1 tablet by mouth daily. (Patient not taking: Reported on 08/03/2021) 30 tablet 2   traZODone (DESYREL) 50 MG tablet Take 1 tablet (50 mg total) by mouth at bedtime as needed for sleep. 30 tablet 3   No facility-administered medications prior to visit.     Allergies  Allergen Reactions   Fish-Derived Products Hives and Nausea And Vomiting    Social History   Tobacco Use   Smoking status: Every Day    Packs/day: 0.50    Types: Cigarettes   Smokeless tobacco: Never  Substance Use Topics   Alcohol use: Yes   Drug use: Yes    Types: Cocaine    Comment: socially    Social History   Substance and Sexual Activity  Sexual Activity Not on file     Objective:   There were no vitals filed for this visit.  There is no height or weight on file to calculate BMI.  Physical Exam  Lab Results Lab Results  Component Value Date   WBC 3.4 (L) 04/05/2021   HGB 13.2 04/05/2021   HCT 37.8 (L) 04/05/2021   MCV 98.2 04/05/2021   PLT 290 04/05/2021    Lab Results  Component Value Date  CREATININE 1.25 (H) 04/17/2021   BUN 18 04/17/2021   NA 138 04/17/2021   K 4.2 04/17/2021   CL 108 04/17/2021   CO2 23 04/17/2021    Lab Results  Component Value Date   ALT 13 04/05/2021   AST 21 04/05/2021   ALKPHOS 61 04/05/2021   BILITOT 0.9 04/05/2021    No results found for: CHOL, HDL, LDLCALC, LDLDIRECT, TRIG, CHOLHDL HIV 1 RNA Quant  Date Value  07/02/2021 48 Copies/mL (H)  04/17/2021 243 Copies/mL (H)  03/20/2021 146,000 copies/mL (H)     Assessment & Plan:   Problem List Items Addressed This Visit   None Will have him back in 19mfor a weight check and regular HIV care.   Total encounter time: 30 min discussing and examining patient for above concerns  SJanene Madeira MSN, NP-C RM[facility] LLCfor IPlaqueminePager: [phone]: [phone] 10/28/21   4:54 PM

## 2021-10-29 ENCOUNTER — Other Ambulatory Visit: Payer: Self-pay

## 2021-10-29 ENCOUNTER — Ambulatory Visit (INDEPENDENT_AMBULATORY_CARE_PROVIDER_SITE_OTHER): Payer: Self-pay | Admitting: Infectious Diseases

## 2021-10-29 ENCOUNTER — Encounter: Payer: Self-pay | Admitting: Infectious Diseases

## 2021-10-29 VITALS — BP 120/79 | HR 85 | Temp 97.8°F | Ht 62.0 in | Wt 120.0 lb

## 2021-10-29 DIAGNOSIS — Z21 Asymptomatic human immunodeficiency virus [HIV] infection status: Secondary | ICD-10-CM

## 2021-10-29 DIAGNOSIS — F310 Bipolar disorder, current episode hypomanic: Secondary | ICD-10-CM

## 2021-10-29 DIAGNOSIS — Z23 Encounter for immunization: Secondary | ICD-10-CM

## 2021-10-29 DIAGNOSIS — Z8619 Personal history of other infectious and parasitic diseases: Secondary | ICD-10-CM

## 2021-10-29 DIAGNOSIS — R634 Abnormal weight loss: Secondary | ICD-10-CM

## 2021-10-29 MED ORDER — BICTEGRAVIR-EMTRICITAB-TENOFOV 50-200-25 MG PO TABS: 1.0000 | ORAL_TABLET | Freq: Every day | ORAL | 5 refills | Status: DC

## 2021-10-29 NOTE — Patient Instructions (Signed)
You look great!  Please continue your Biktarvy every day.   Stop by the lab on your way out.   Would love to see you back in 4 months

## 2021-10-29 NOTE — Assessment & Plan Note (Signed)
Doing very well on Biktarvy with recent viral load down to < 100. We spent time reviewing historical labs highlighting the improvement in his viral load and recovery of his TCell count. Will update CMP, VL and CD4 today.  Had STI screening recently and reports no activity.  Menveo today to complete first dose.  We can work on HPV series next OV in 27m.

## 2021-10-29 NOTE — Assessment & Plan Note (Signed)
He states that this has started to improve. Will continue to watch.

## 2021-10-30 ENCOUNTER — Encounter (HOSPITAL_COMMUNITY): Payer: Self-pay | Admitting: Physician Assistant

## 2021-10-30 ENCOUNTER — Telehealth (INDEPENDENT_AMBULATORY_CARE_PROVIDER_SITE_OTHER): Payer: No Payment, Other | Admitting: Physician Assistant

## 2021-10-30 DIAGNOSIS — F332 Major depressive disorder, recurrent severe without psychotic features: Secondary | ICD-10-CM

## 2021-10-30 DIAGNOSIS — F310 Bipolar disorder, current episode hypomanic: Secondary | ICD-10-CM | POA: Diagnosis not present

## 2021-10-30 DIAGNOSIS — F411 Generalized anxiety disorder: Secondary | ICD-10-CM

## 2021-10-30 DIAGNOSIS — G4700 Insomnia, unspecified: Secondary | ICD-10-CM

## 2021-10-30 LAB — T-HELPER CELLS (CD4) COUNT (NOT AT ARMC)
CD4 % Helper T Cell: 15 % — ABNORMAL LOW (ref 33–65)
CD4 T Cell Abs: 130 /uL — ABNORMAL LOW (ref 400–1790)

## 2021-10-30 MED ORDER — TRAZODONE HCL 50 MG PO TABS: 50.0000 mg | ORAL_TABLET | Freq: Every evening | ORAL | 3 refills | Status: DC | PRN

## 2021-10-30 MED ORDER — ARIPIPRAZOLE 10 MG PO TABS
10.0000 mg | ORAL_TABLET | Freq: Every day | ORAL | 2 refills | Status: DC
Start: 2021-10-30 — End: 2022-01-29

## 2021-10-30 MED ORDER — ESCITALOPRAM OXALATE 20 MG PO TABS: 20.0000 mg | ORAL_TABLET | Freq: Every day | ORAL | 3 refills | Status: DC

## 2021-10-30 NOTE — Progress Notes (Addendum)
BH MD/PA/NP OP Progress Note  Virtual Visit via Video Note  I connected with Lee Bridges on 10/30/21 at  3:30 PM EDT by a video enabled telemedicine application and verified that I am speaking with the correct person using two identifiers.  Location: Patient: Home Provider: Clinic   I discussed the limitations of evaluation and management by telemedicine and the availability of in person appointments. The patient expressed understanding and agreed to proceed.  Follow Up Instructions:  I discussed the assessment and treatment plan with the patient. The patient was provided an opportunity to ask questions and all were answered. The patient agreed with the plan and demonstrated an understanding of the instructions.   The patient was advised to call back or seek an in-person evaluation if the symptoms worsen or if the condition fails to improve as anticipated.  I provided 19 minutes of non-face-to-face time during this encounter.  Meta Hatchet, PA   10/30/2021 3:58 PM Lee Bridges  MRN:  [id number]  Chief Complaint:  Chief Complaint  Patient presents with   Follow-up   Medication Management   HPI:   Lee Bridges "[name]" is a 31 year old male with a past psychiatric history significant for bipolar disorder who presents to [facility] via virtual video visit for follow-up and medication management.  Patient is currently being managed on the following medications:  Abilify 10 mg daily Escitalopram 20 mg daily  Patient reports that he is doing great.  Although he endorses good mood, he states that he has not been sleeping a lot.  Patient states that he receives on average 3 to 4 hours of sleep characterized by constantly waking up.  Despite experiencing sleep disturbances, patient states that he feels stable and has experienced less instances of lashing out.  Patient denies depressive symptoms or anxiety.  He denies any new stressors at this  time.  A GAD-7 screen was performed with the patient scoring a 14.  Patient is alert and oriented x4, calm, cooperative, and fully engaged in conversation during the encounter.  Patient reports feeling very stable.  Patient denies suicidal or homicidal ideations.  He further denies auditory or visual hallucinations and does not appear to be responding to internal/external stimuli.  Patient endorses poor sleep and receives on average 3 to 4 hours of sleep each night.  Patient endorses good appetite and eats on average 2 meals per day.  Patient denies alcohol consumption and illicit drug use.  Patient admits to using cocaine in the past but states that he has limited his usage considerably.  Patient endorses tobacco use and smokes on average a pack per day.  Visit Diagnosis:    ICD-10-CM   1. Bipolar affective disorder, current episode hypomanic (HCC)  F31.0 ARIPiprazole (ABILIFY) 10 MG tablet    2. Generalized anxiety disorder  F41.1 escitalopram (LEXAPRO) 20 MG tablet    traZODone (DESYREL) 50 MG tablet    3. Severe episode of recurrent major depressive disorder, without psychotic features (HCC)  F33.2 escitalopram (LEXAPRO) 20 MG tablet    4. Insomnia, unspecified type  G47.00 traZODone (DESYREL) 50 MG tablet      Past Psychiatric History:  Unspecified mood disorder Major depressive disorder Generalized anxiety disorder  Past Medical History:  Past Medical History:  Diagnosis Date   HIV (human immunodeficiency virus infection) (HCC)    History reviewed. No pertinent surgical history.  Family Psychiatric History:  Patient reports that psychiatric illnesses run on his mother side of the  family.   Cousin - Bipolar disorder/mood swings Cousin - Schizophrenia  Family History:  Family History  Problem Relation Age of Onset   Arthritis Mother    Diabetes Father    Hypertension Father     Social History:  Social History   Socioeconomic History   Marital status: Single     Spouse name: Not on file   Number of children: Not on file   Years of education: Not on file   Highest education level: Not on file  Occupational History   Not on file  Tobacco Use   Smoking status: Every Day    Packs/day: 0.50    Types: Cigarettes   Smokeless tobacco: Never  Substance and Sexual Activity   Alcohol use: Not Currently   Drug use: Not Currently    Types: Cocaine    Comment: socially   Sexual activity: Not on file  Other Topics Concern   Not on file  Social History Narrative   Lives with mother.    Social Determinants of Health   Financial Resource Strain: Not on file  Food Insecurity: Not on file  Transportation Needs: Not on file  Physical Activity: Not on file  Stress: Not on file  Social Connections: Not on file    Allergies:  Allergies  Allergen Reactions   Fish-Derived Products Hives and Nausea And Vomiting    Metabolic Disorder Labs: No results found for: HGBA1C, MPG No results found for: PROLACTIN No results found for: CHOL, TRIG, HDL, CHOLHDL, VLDL, LDLCALC No results found for: TSH  Therapeutic Level Labs: No results found for: LITHIUM No results found for: VALPROATE No components found for:  CBMZ  Current Medications: Current Outpatient Medications  Medication Sig Dispense Refill   ARIPiprazole (ABILIFY) 10 MG tablet Take 1 tablet (10 mg total) by mouth daily. 30 tablet 2   bictegravir-emtricitabine-tenofovir AF (BIKTARVY) 50-200-25 MG TABS tablet Take 1 tablet by mouth daily. Try to take at the same time each day with or without food. 30 tablet 5   chlorhexidine (PERIDEX) 0.12 % solution 15 mLs 2 (two) times daily.     escitalopram (LEXAPRO) 20 MG tablet Take 1 tablet (20 mg total) by mouth daily. 30 tablet 3   hydrOXYzine (ATARAX) 25 MG tablet Take 25 mg by mouth 3 (three) times daily. (Patient not taking: Reported on 10/29/2021)     PREVIDENT 5000 PLUS 1.1 % CREA dental cream SMARTSIG:Sparingly By Mouth Every Evening (Patient not  taking: Reported on 10/29/2021)     traZODone (DESYREL) 50 MG tablet Take 1 tablet (50 mg total) by mouth at bedtime as needed for sleep. 30 tablet 3   No current facility-administered medications for this visit.     Musculoskeletal: Strength & Muscle Tone: within normal limits Gait & Station: normal Patient leans: N/A  Psychiatric Specialty Exam: Review of Systems  Psychiatric/Behavioral:  Positive for sleep disturbance. Negative for decreased concentration, dysphoric mood, hallucinations, self-injury and suicidal ideas. The patient is nervous/anxious. The patient is not hyperactive.    There were no vitals taken for this visit.There is no height or weight on file to calculate BMI.  General Appearance: Casual  Eye Contact:  Good  Speech:  Clear and Coherent and Normal Rate  Volume:  Normal  Mood:  Anxious and Euthymic  Affect:  Appropriate and Congruent  Thought Process:  Coherent, Goal Directed, and Descriptions of Associations: Intact  Orientation:  Full (Time, Place, and Person)  Thought Content: WDL   Suicidal Thoughts:  No  Homicidal Thoughts:  No  Memory:  Immediate;   Good Recent;   Good Remote;   Good  Judgement:  Good  Insight:  Good  Psychomotor Activity:  Normal  Concentration:  Concentration: Good and Attention Span: Good  Recall:  Good  Fund of Knowledge: Good  Language: Good  Akathisia:  No  Handed:  Right  AIMS (if indicated): not done  Assets:  Communication Skills Desire for Improvement Housing Social Support  ADL's:  Intact  Cognition: WNL  Sleep:  Fair   Screenings: GAD-7    Flowsheet Row Video Visit from 10/30/2021 in [facility] Video Visit from 08/28/2021 in [facility] Office Visit from 07/24/2021 in [facility] Video Visit from 06/11/2021 in [facility] Office Visit from 04/09/2021 in [facility]  Total  GAD-7 Score 14 8 16 2 19       PHQ2-9    Flowsheet Row Video Visit from 10/30/2021 in [facility] Office Visit from 10/29/2021 in [facility] Video Visit from 08/28/2021 in [facility] Office Visit from 07/24/2021 in [facility] Video Visit from 06/11/2021 in [facility]  PHQ-2 Total Score 0 0 0 5 2  PHQ-9 Total Score -- -- -- 20 15      Flowsheet Row Video Visit from 10/30/2021 in [facility] Video Visit from 08/28/2021 in [facility] Office Visit from 07/24/2021 in [facility]  C-SSRS RISK CATEGORY No Risk No Risk Moderate Risk        Assessment and Plan:   Tanveer Brammer "[name]" is a 31 year old male with a past psychiatric history significant for bipolar disorder who presents to [facility] via virtual video visit for follow-up and medication management.  Patient endorses stable mood but states that he has been experiencing issues with his sleep.  Patient reports receiving on average 3 to 4 hours of sleep characterized by constantly waking up.  Patient is interested in rechallenging trazodone for the management of his sleep.  Patient's medications to be e-prescribed to pharmacy of choice.  Collaboration of Care: Collaboration of Care: Medication Management AEB provider managing patient's psychiatric medications, Psychiatrist AEB patient being followed by a mental health provider, Other provider involved in patient's care AEB patient being followed by infectious diseases, and Referral or follow-up with counselor/therapist AEB patient being seen by a counselor  Patient/Guardian was advised Release of Information must be obtained prior to any record release in order to collaborate their care with an outside provider.  Patient/Guardian was advised if they have not already done so to contact the registration department to sign all necessary forms in order for [facility] to release information regarding their care.   Consent: Patient/Guardian gives verbal consent for treatment and assignment of benefits for services provided during this visit. Patient/Guardian expressed understanding and agreed to proceed.   1. Bipolar affective disorder, current episode hypomanic (HCC)  - ARIPiprazole (ABILIFY) 10 MG tablet; Take 1 tablet (10 mg total) by mouth daily.  Dispense: 30 tablet; Refill: 2  2. Generalized anxiety disorder  - escitalopram (LEXAPRO) 20 MG tablet; Take 1 tablet (20 mg total) by mouth daily.  Dispense: 30 tablet; Refill: 3 - traZODone (DESYREL) 50 MG tablet; Take 1 tablet (50 mg total) by mouth at bedtime as needed for sleep.  Dispense: 30 tablet; Refill: 3  3. Severe episode of recurrent major depressive disorder, without psychotic features (HCC)  - escitalopram (LEXAPRO) 20 MG tablet; Take 1 tablet (20 mg total) by mouth daily.  Dispense: 30 tablet; Refill: 3  4. Insomnia, unspecified type  - traZODone (DESYREL) 50 MG tablet; Take 1 tablet (50 mg total) by mouth at bedtime as needed for sleep.  Dispense: 30 tablet; Refill: 3  Patient to follow up in 3 months Provider spent a total of 19 minutes with the patient/reviewing patient's chart  Meta Hatchet, PA 10/30/2021, 3:58 PM

## 2021-11-02 LAB — COMPLETE METABOLIC PANEL WITH GFR
AG Ratio: 1.4 (calc) (ref 1.0–2.5)
ALT: 9 U/L (ref 9–46)
AST: 15 U/L (ref 10–40)
Albumin: 4.3 g/dL (ref 3.6–5.1)
Alkaline phosphatase (APISO): 63 U/L (ref 36–130)
BUN: 12 mg/dL (ref 7–25)
CO2: 24 mmol/L (ref 20–32)
Calcium: 9.3 mg/dL (ref 8.6–10.3)
Chloride: 105 mmol/L (ref 98–110)
Creat: 0.99 mg/dL (ref 0.60–1.24)
Globulin: 3.1 g/dL (calc) (ref 1.9–3.7)
Glucose, Bld: 93 mg/dL (ref 65–99)
Potassium: 3.8 mmol/L (ref 3.5–5.3)
Sodium: 138 mmol/L (ref 135–146)
Total Bilirubin: 0.3 mg/dL (ref 0.2–1.2)
Total Protein: 7.4 g/dL (ref 6.1–8.1)
eGFR: 106 mL/min/{1.73_m2} (ref >=60)

## 2021-11-02 LAB — HIV-1 RNA QUANT-NO REFLEX-BLD
HIV 1 RNA Quant: 88100 Copies/mL — ABNORMAL HIGH
HIV-1 RNA Quant, Log: 4.94 Log cps/mL — ABNORMAL HIGH

## 2021-11-03 ENCOUNTER — Other Ambulatory Visit: Payer: Self-pay

## 2021-11-03 MED ORDER — SULFAMETHOXAZOLE-TRIMETHOPRIM 800-160 MG PO TABS: 1.0000 | ORAL_TABLET | Freq: Every day | ORAL | 2 refills | Status: DC

## 2021-11-05 NOTE — Progress Notes (Signed)
Looks like he stopped his medications again. Have been working with Karn Pickler - FU rescheduled for July and instructed to resume the biktarvy daily. Restarting bactrim as well for 3 months.

## 2021-11-15 ENCOUNTER — Ambulatory Visit
Admission: EM | Admit: 2021-11-15 | Discharge: 2021-11-15 | Disposition: A | Discharge status: Home / Self Care | Payer: Self-pay

## 2021-11-15 ENCOUNTER — Encounter: Payer: Self-pay | Admitting: Emergency Medicine

## 2021-11-15 DIAGNOSIS — K5900 Constipation, unspecified: Secondary | ICD-10-CM

## 2021-11-15 DIAGNOSIS — K629 Disease of anus and rectum, unspecified: Secondary | ICD-10-CM

## 2021-11-15 NOTE — Discharge Instructions (Signed)
For your constipation, I recommend that you stop by the pharmacy and pick up a 3 bottles of MiraLAX 255 g and a 3 32 ounce bottles of your favorite flavor of Gatorade.  Please mix the 2 together and consume the full 32 ounces within 1 hour, 8 ounces every 15 minutes.  This should produce significant bowel movement ultimately resulting in what appears to be diarrhea.  If you do not have a significant result please repeat this process again tomorrow.  Once your bowels have been cleaned out, I recommend that you consume 8 ounces of clear liquid such as Gatorade or water with 17 g, 1 capful, of MiraLAX every day to prevent constipation from reoccurring.  Once you have your bowels moving regularly and predictably, you will be better able to observe the lesion in your anal opening to determine whether constipation makes it worse or not.  As we discussed, please reach out to your infectious disease provider for an ASAP appointment to have the lesion evaluated, possibly treated or possibly removed.  I am sorry that I could not do more for you today however happy to provide you with a note to be out of work for a few days until you get this issue sorted.  If you need a note to extend your time out from work, please stop by the clinic this coming Wednesday or Thursday and I will be happy to provide you with another one, I will be working here those two days next week.

## 2021-11-15 NOTE — ED Triage Notes (Signed)
Pt here with possible hemorrhoids and constipation x 2 weeks.

## 2021-11-16 NOTE — ED Provider Notes (Signed)
[facility]    CSN: [id number] Arrival date & time: 11/15/21  1153    HISTORY   Chief Complaint  Patient presents with   Hemorrhoids   HPI Lee Bridges is a 31 y.o. male. Patient presents to urgent care today complaining of a lesion on his anus present for the past 2 weeks.  Patient states he has had this lesion in the past and that usually goes away with diligent application of Neosporin.  Patient states he has been very constipated and is concerned that he has a hemorrhoid.  Patient states he engages in anal intercourse, denies regular condom use.  Patient states he is HIV positive, just saw his infectious disease provider 2 weeks ago and had all negative results of his routine blood work, penile, oral and anal swabs.  Patient states the lesion was not present 2 weeks ago.    The history is provided by the patient.   Past Medical History:  Diagnosis Date   HIV (human immunodeficiency virus infection) [facility])    Patient Active Problem List   Diagnosis Date Noted   Weight loss, unintentional 08/04/2021   Hallucinations 07/11/2021   Moderate episode of recurrent major depressive disorder (HCC) 06/12/2021   Generalized anxiety disorder 04/09/2021   Severe episode of recurrent major depressive disorder, without psychotic features (HCC) 04/09/2021   Bipolar affective disorder, current episode hypomanic (HCC) 04/09/2021   Cocaine abuse (HCC) 03/23/2021   HIV (human immunodeficiency virus infection) (HCC) 03/20/2021   History of syphilis 03/20/2021   History reviewed. No pertinent surgical history.  Home Medications    Prior to Admission medications   Medication Sig Start Date End Date Taking? Authorizing Provider  ARIPiprazole (ABILIFY) 10 MG tablet Take 1 tablet (10 mg total) by mouth daily. 10/30/21 10/30/22  Nwoko, Tommas Olp, PA  bictegravir-emtricitabine-tenofovir AF (BIKTARVY) 50-200-25 MG TABS tablet Take 1 tablet by mouth daily. Try to take at the same time each day  with or without food. 10/29/21   Blanchard Kelch, NP  chlorhexidine (PERIDEX) 0.12 % solution 15 mLs 2 (two) times daily. 07/06/21   [provider]  escitalopram (LEXAPRO) 20 MG tablet Take 1 tablet (20 mg total) by mouth daily. 10/30/21   Nwoko, Tommas Olp, PA  sulfamethoxazole-trimethoprim (BACTRIM DS) 800-160 MG tablet Take 1 tablet by mouth daily. 11/03/21   Blanchard Kelch, NP  traZODone (DESYREL) 50 MG tablet Take 1 tablet (50 mg total) by mouth at bedtime as needed for sleep. 10/30/21   Meta Hatchet, PA   Family History Family History  Problem Relation Age of Onset   Arthritis Mother    Diabetes Father    Hypertension Father    Social History Social History   Tobacco Use   Smoking status: Every Day    Packs/day: 0.50    Types: Cigarettes   Smokeless tobacco: Never  Substance Use Topics   Alcohol use: Not Currently   Drug use: Not Currently    Types: Cocaine    Comment: socially   Allergies   Fish-derived products  Review of Systems Review of Systems Pertinent findings noted in history of present illness.   Physical Exam Triage Vital Signs ED Triage Vitals  Enc Vitals Group     BP 03/27/21 0827 (!) 147/82     Pulse Rate 03/27/21 0827 72     Resp 03/27/21 0827 18     Temp 03/27/21 0827 98.3 F (36.8 C)     Temp Source 03/27/21 0827 Oral  SpO2 03/27/21 0827 98 %     Weight --      Height --      Head Circumference --      Peak Flow --      Pain Score 03/27/21 0826 5     Pain Loc --      Pain Edu? --      Excl. in GC? --   No data found.  Updated Vital Signs BP 108/69   Pulse 98   Temp 97.9 F (36.6 C)   Resp 18   SpO2 98%   Physical Exam Constitutional:      General: He is awake. He is not in acute distress.    Appearance: Normal appearance. He is well-developed, well-groomed and normal weight.  Genitourinary:    Comments: There is a white appearing, moist mass protruding from anus as well as macerated skin opposite the mass in the  perianal area.  Patient reports exquisite tenderness to palpation.  Neurological:     Mental Status: He is alert.  Psychiatric:        Behavior: Behavior is cooperative.     Visual Acuity Right Eye Distance:   Left Eye Distance:   Bilateral Distance:    Right Eye Near:   Left Eye Near:    Bilateral Near:     UC Couse / Diagnostics / Procedures:    EKG  Radiology No results found.  Procedures Procedures (including critical care time)  UC Diagnoses / Final Clinical Impressions(s)   I have reviewed the triage vital signs and the nursing notes.  Pertinent labs & imaging results that were available during my care of the patient were reviewed by me and considered in my medical decision making (see chart for details).    Final diagnoses:  Constipation, unspecified constipation type  Anal lesion   Patient politely declined repeat STD testing, states he has not had anal sex with anyone since he was seen by infectious disease.  Patient advised to follow-up with infectious disease as I am concerned that the lesion is something more sinister than a hemorrhoid, particularly since it does not resemble a hemorrhoid on physical exam.  Patient was agreeable to contacting his ID provider first thing in the morning.  Patient given recommendations to resolve constipation.    ED Prescriptions   None    PDMP not reviewed this encounter.  Pending results:  Labs Reviewed - No data to display  Medications Ordered in UC: Medications - No data to display  Disposition Upon Discharge:  Condition: stable for discharge home  Patient presented with concern for an acute illness with associated systemic symptoms and significant discomfort requiring urgent management. In my opinion, this is a condition that a prudent lay person (someone who possesses an average knowledge of health and medicine) may potentially expect to result in complications if not addressed urgently such as respiratory  distress, impairment of bodily function or dysfunction of bodily organs.   As such, the patient has been evaluated and assessed, work-up was performed and treatment was provided in alignment with urgent care protocols and evidence based medicine.  Patient/parent/caregiver has been advised that the patient may require follow up for further testing and/or treatment if the symptoms continue in spite of treatment, as clinically indicated and appropriate.  Routine symptom specific, illness specific and/or disease specific instructions were discussed with the patient and/or caregiver at length.  Prevention strategies for avoiding STD exposure were also discussed.  The patient will follow up with  their current PCP if and as advised. If the patient does not currently have a PCP we will assist them in obtaining one.   The patient may need specialty follow up if the symptoms continue, in spite of conservative treatment and management, for further workup, evaluation, consultation and treatment as clinically indicated and appropriate.  Patient/parent/caregiver verbalized understanding and agreement of plan as discussed.  All questions were addressed during visit.  Please see discharge instructions below for further details of plan.  Discharge Instructions:   Discharge Instructions      For your constipation, I recommend that you stop by the pharmacy and pick up a 3 bottles of MiraLAX 255 g and a 3 32 ounce bottles of your favorite flavor of Gatorade.  Please mix the 2 together and consume the full 32 ounces within 1 hour, 8 ounces every 15 minutes.  This should produce significant bowel movement ultimately resulting in what appears to be diarrhea.  If you do not have a significant result please repeat this process again tomorrow.  Once your bowels have been cleaned out, I recommend that you consume 8 ounces of clear liquid such as Gatorade or water with 17 g, 1 capful, of MiraLAX every day to prevent  constipation from reoccurring.  Once you have your bowels moving regularly and predictably, you will be better able to observe the lesion in your anal opening to determine whether constipation makes it worse or not.  As we discussed, please reach out to your infectious disease provider for an ASAP appointment to have the lesion evaluated, possibly treated or possibly removed.  I am sorry that I could not do more for you today however happy to provide you with a note to be out of work for a few days until you get this issue sorted.  If you need a note to extend your time out from work, please stop by the clinic this coming Wednesday or Thursday and I will be happy to provide you with another one, I will be working here those two days next week.    This office note has been dictated using Teaching laboratory technician.  Unfortunately, and despite my best efforts, this method of dictation can sometimes lead to occasional typographical or grammatical errors.  I apologize in advance if this occurs.      Theadora Rama Scales, PA-C 11/16/21 1122

## 2021-11-17 ENCOUNTER — Telehealth: Payer: Self-pay

## 2021-11-17 NOTE — Telephone Encounter (Addendum)
Patient sent mychart message requesting appointment this week for Anal pain. Seen Urgent care on 6/18 for same issue. Negative for STD's at last appointment. Offered patient appointment for tomorrow at 315 with Carver Fila, FNP or  6/22 with Durwin Nora, NP at 4. Waiting on patient to confirm appointment.  Juanita Laster, RMA

## 2021-11-19 ENCOUNTER — Ambulatory Visit (INDEPENDENT_AMBULATORY_CARE_PROVIDER_SITE_OTHER): Payer: Self-pay | Admitting: Infectious Diseases

## 2021-11-19 ENCOUNTER — Encounter: Payer: Self-pay | Admitting: Infectious Diseases

## 2021-11-19 ENCOUNTER — Other Ambulatory Visit: Payer: Self-pay

## 2021-11-19 VITALS — BP 132/88 | HR 98 | Temp 98.0°F | Ht 62.0 in | Wt 119.0 lb

## 2021-11-19 DIAGNOSIS — L0291 Cutaneous abscess, unspecified: Secondary | ICD-10-CM

## 2021-11-19 DIAGNOSIS — Z21 Asymptomatic human immunodeficiency virus [HIV] infection status: Secondary | ICD-10-CM

## 2021-11-19 DIAGNOSIS — A630 Anogenital (venereal) warts: Secondary | ICD-10-CM

## 2021-11-19 MED ORDER — DOXYCYCLINE HYCLATE 100 MG PO TABS: 100.0000 mg | ORAL_TABLET | Freq: Two times a day (BID) | ORAL | 0 refills | Status: DC

## 2021-11-19 NOTE — Patient Instructions (Addendum)
I will refer you to Dr. Rubye Oaks for evaluation for surgery.   Stool softeners or laxatives - Miralax to mix in your drink once a day - fill a cap full and dump in in at least 6 oz of fluid.   Keep your biktarvy and bactrim everyday!   Will start you on an antibiotic called Doxycycline one tablet with breakfast AND one tablet with dinner for 2 weeks.

## 2021-11-19 NOTE — Progress Notes (Unsigned)
Name: Lee Bridges  DOB: [birth year] MRN: [id number] PCP: Pcp, No    Brief Narrative:  Lee Bridges is a 31 y.o. male with HIV disease, Dx 10/17/2014. Never on medications.  CD4 nadir 180 VL unknown HIV Risk: sexual  History of OIs: none Intake Labs 03/20/21: Hep B sAg (-), sAb (-), cAb (-); Hep A (-), Hep C (-) Quantiferon () HLA B*5701 () G6PD: ()   Previous Regimens: Biktarvy    Genotypes: Pending   Subjective:   No chief complaint on file. Anal pain     HPI: Seen at urgent care on 6/18 for rectal pain and anal lesion that came up over the last 2 weeks. Has had this in the past with improvement with topical neosporin applications but not this time.   He tells me he has actually been off his biktarvy (88,000 copies with labs recently).       10/30/2021    3:36 PM  Depression screen PHQ 2/9  Decreased Interest   Down, Depressed, Hopeless   PHQ - 2 Score      Information is confidential and restricted. Go to Review Flowsheets to unlock data.     Review of Systems  Constitutional:  Negative for chills and fever.  HENT:  Negative for tinnitus.   Eyes:  Negative for blurred vision and photophobia.  Respiratory:  Negative for cough and sputum production.   Cardiovascular:  Negative for chest pain.  Gastrointestinal:  Negative for diarrhea, nausea and vomiting.  Genitourinary:  Negative for dysuria.  Skin:  Negative for rash.  Neurological:  Negative for headaches.    Past Medical History:  Diagnosis Date   HIV (human immunodeficiency virus infection) ([place])     Outpatient Medications Prior to Visit  Medication Sig Dispense Refill   ARIPiprazole (ABILIFY) 10 MG tablet Take 1 tablet (10 mg total) by mouth daily. 30 tablet 2   bictegravir-emtricitabine-tenofovir AF (BIKTARVY) 50-200-25 MG TABS tablet Take 1 tablet by mouth daily. Try to take at the same time each day with or without food. 30 tablet 5   chlorhexidine (PERIDEX) 0.12 % solution 15 mLs 2 (two)  times daily.     escitalopram (LEXAPRO) 20 MG tablet Take 1 tablet (20 mg total) by mouth daily. 30 tablet 3   sulfamethoxazole-trimethoprim (BACTRIM DS) 800-160 MG tablet Take 1 tablet by mouth daily. 30 tablet 2   traZODone (DESYREL) 50 MG tablet Take 1 tablet (50 mg total) by mouth at bedtime as needed for sleep. 30 tablet 3   No facility-administered medications prior to visit.     Allergies  Allergen Reactions   Fish-Derived Products Hives and Nausea And Vomiting    Social History   Tobacco Use   Smoking status: Every Day    Packs/day: 0.50    Types: Cigarettes   Smokeless tobacco: Never  Substance Use Topics   Alcohol use: Not Currently   Drug use: Not Currently    Types: Cocaine    Comment: socially    Social History   Substance and Sexual Activity  Sexual Activity Not on file     Objective:   There were no vitals filed for this visit.  There is no height or weight on file to calculate BMI.  Physical Exam Constitutional:      Appearance: Normal appearance. He is not ill-appearing.  HENT:     Head: Normocephalic.     Mouth/Throat:     Mouth: Mucous membranes are moist.     Pharynx:  Oropharynx is clear.  Eyes:     General: No scleral icterus. Cardiovascular:     Rate and Rhythm: Normal rate and regular rhythm.  Pulmonary:     Effort: Pulmonary effort is normal.  Musculoskeletal:        General: Normal range of motion.     Cervical back: Normal range of motion.  Skin:    Coloration: Skin is not jaundiced or pale.  Neurological:     Mental Status: He is alert and oriented to person, place, and time.  Psychiatric:        Mood and Affect: Mood normal.        Judgment: Judgment normal.     Lab Results Lab Results  Component Value Date   WBC 3.4 (L) 04/05/2021   HGB 13.2 04/05/2021   HCT 37.8 (L) 04/05/2021   MCV 98.2 04/05/2021   PLT 290 04/05/2021    Lab Results  Component Value Date   CREATININE 0.99 10/29/2021   BUN 12 10/29/2021   NA  138 10/29/2021   K 3.8 10/29/2021   CL 105 10/29/2021   CO2 24 10/29/2021    Lab Results  Component Value Date   ALT 9 10/29/2021   AST 15 10/29/2021   ALKPHOS 61 04/05/2021   BILITOT 0.3 10/29/2021    No results found for: "CHOL", "HDL", "LDLCALC", "LDLDIRECT", "TRIG", "CHOLHDL" HIV 1 RNA Quant (Copies/mL)  Date Value  10/29/2021 88,100 (H)  07/02/2021 48 (H)  04/17/2021 243 (H)   CD4 T Cell Abs (/uL)  Date Value  10/29/2021 130 (L)     Assessment & Plan:   Problem List Items Addressed This Visit   None  Janene Madeira, MSN, NP-C [place] [facility] [place] Pager: [phone] Office: [phone]  11/19/21  11:05 AM

## 2021-11-23 DIAGNOSIS — A630 Anogenital (venereal) warts: Secondary | ICD-10-CM | POA: Insufficient documentation

## 2021-12-30 NOTE — Progress Notes (Deleted)
Name: Lee Bridges  DOB: 11-12-1990 MRN: [id number] PCP: Pcp, No    Brief Narrative:  Lee Bridges is a 31 y.o. male with HIV disease, Dx 10/17/2014. Never on medications.  CD4 nadir 180 VL unknown HIV Risk: sexual  History of OIs: none Intake Labs 03/20/21: Hep B sAg (-), sAb (-), cAb (-); Hep A (-), Hep C (-) Quantiferon () HLA B*5701 () G6PD: ()   Previous Regimens: Biktarvy    Genotypes: Pending   Subjective:   CC: HIV follow up on medication adherence.     HPI: ***     11/19/2021    3:56 PM  Depression screen PHQ 2/9  Decreased Interest 0  Down, Depressed, Hopeless 0  PHQ - 2 Score 0    Review of Systems  Constitutional:  Negative for chills and fever.  HENT:  Negative for tinnitus.   Eyes:  Negative for blurred vision and photophobia.  Respiratory:  Negative for cough and sputum production.   Cardiovascular:  Negative for chest pain.  Gastrointestinal:  Negative for diarrhea, nausea and vomiting.       Peri-rectal pain with defecation  Genitourinary:  Negative for dysuria.  Skin:  Negative for rash.  Neurological:  Negative for headaches.    Past Medical History:  Diagnosis Date   HIV (human immunodeficiency virus infection) ([place])     Outpatient Medications Prior to Visit  Medication Sig Dispense Refill   ARIPiprazole (ABILIFY) 10 MG tablet Take 1 tablet (10 mg total) by mouth daily. 30 tablet 2   bictegravir-emtricitabine-tenofovir AF (BIKTARVY) 50-200-25 MG TABS tablet Take 1 tablet by mouth daily. Try to take at the same time each day with or without food. 30 tablet 5   chlorhexidine (PERIDEX) 0.12 % solution 15 mLs 2 (two) times daily.     doxycycline (VIBRA-TABS) 100 MG tablet Take 1 tablet (100 mg total) by mouth 2 (two) times daily. 20 tablet 0   escitalopram (LEXAPRO) 20 MG tablet Take 1 tablet (20 mg total) by mouth daily. 30 tablet 3   sulfamethoxazole-trimethoprim (BACTRIM DS) 800-160 MG tablet Take 1 tablet by mouth daily. 30 tablet  2   traZODone (DESYREL) 50 MG tablet Take 1 tablet (50 mg total) by mouth at bedtime as needed for sleep. 30 tablet 3   No facility-administered medications prior to visit.     Allergies  Allergen Reactions   Fish-Derived Products Hives and Nausea And Vomiting    Social History   Tobacco Use   Smoking status: Every Day    Packs/day: 0.50    Types: Cigarettes   Smokeless tobacco: Never  Substance Use Topics   Alcohol use: Not Currently   Drug use: Not Currently    Types: Cocaine    Comment: socially    Social History   Substance and Sexual Activity  Sexual Activity Not Currently   Comment: DECLINED CONDOMS     Objective:   There were no vitals filed for this visit.  There is no height or weight on file to calculate BMI.  Physical Exam Vitals reviewed. Exam conducted with a chaperone present.  Constitutional:      Appearance: Normal appearance.  Genitourinary:    Pubic Area: No rash.      Testes: Normal.       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 One lesion that is not of the same character with hypopigmented and flat appearance. Quite tender with palpation. Indurated but not fluctuant.  Neurological:  Mental Status: He is alert and oriented to person, place, and time.     Lab Results Lab Results  Component Value Date   WBC 3.4 (L) 04/05/2021   HGB 13.2 04/05/2021   HCT 37.8 (L) 04/05/2021   MCV 98.2 04/05/2021   PLT 290 04/05/2021    Lab Results  Component Value Date   CREATININE 0.99 10/29/2021   BUN 12 10/29/2021   NA 138 10/29/2021   K 3.8 10/29/2021   CL 105 10/29/2021   CO2 24 10/29/2021    Lab Results  Component Value Date   ALT 9 10/29/2021   AST 15 10/29/2021   ALKPHOS 61 04/05/2021   BILITOT 0.3 10/29/2021    No results found for: "CHOL", "HDL", "LDLCALC", "LDLDIRECT", "TRIG", "CHOLHDL" HIV 1 RNA Quant (Copies/mL)  Date Value  10/29/2021 88,100 (H)  07/02/2021 48 (H)  04/17/2021 243  (H)   CD4 T Cell Abs (/uL)  Date Value  10/29/2021 130 (L)     Assessment & Plan:   Problem List Items Addressed This Visit   None Janene Madeira, MSN, NP-C [place] [facility] [place] Pager: [phone] Office: [phone]  12/30/21  3:48 PM

## 2021-12-31 ENCOUNTER — Ambulatory Visit: Payer: Self-pay | Admitting: Infectious Diseases

## 2022-01-06 ENCOUNTER — Ambulatory Visit (INDEPENDENT_AMBULATORY_CARE_PROVIDER_SITE_OTHER): Payer: Self-pay | Admitting: Infectious Diseases

## 2022-01-06 ENCOUNTER — Encounter: Payer: Self-pay | Admitting: Infectious Diseases

## 2022-01-06 ENCOUNTER — Other Ambulatory Visit: Payer: Self-pay

## 2022-01-06 VITALS — BP 109/72 | HR 92 | Temp 97.7°F | Ht 62.0 in | Wt 116.0 lb

## 2022-01-06 DIAGNOSIS — Z23 Encounter for immunization: Secondary | ICD-10-CM

## 2022-01-06 DIAGNOSIS — Z0000 Encounter for general adult medical examination without abnormal findings: Secondary | ICD-10-CM | POA: Insufficient documentation

## 2022-01-06 DIAGNOSIS — Z21 Asymptomatic human immunodeficiency virus [HIV] infection status: Secondary | ICD-10-CM

## 2022-01-06 NOTE — Assessment & Plan Note (Signed)
Reports he has been doing well again taking biktarvy and bactrim everyday. He hopes to stop the bactrim soon as it causes some dry mouth.   Will repeat HIV RNA today for adherence follow up given he was poorly controlled with CD4 130 last visit 2 months ago.   HPV vaccine #1 today

## 2022-01-06 NOTE — Patient Instructions (Signed)
Please continue the bactrim and the biktarvy for now.   If your immune system has improved back up > 200 then we can stop the bactrim if you are having some side effects from it.   Will plan to see you back with me in 3 months. Can do your 2nd HPV vaccine then.

## 2022-01-06 NOTE — Assessment & Plan Note (Signed)
Counseled on HPV vaccination. #1 dose given today. Will give second at return visit.

## 2022-01-06 NOTE — Progress Notes (Signed)
  Name: Lee Bridges  DOB: 09/26/1990 MRN: [id number] PCP: Pcp, No    Brief Narrative:  Lee Bridges is a 31 y.o. male with HIV disease, Dx 10/17/2014. Never on medications.  CD4 nadir 180 VL unknown HIV Risk: sexual  History of OIs: none Intake Labs 03/20/21: Hep B sAg (-), sAb (-), cAb (-); Hep A (-), Hep C (-) Quantiferon () HLA B*5701 () G6PD: ()   Previous Regimens: Biktarvy    Genotypes: Pending   Subjective:   CC: HIV follow up on adherence to medications.     HPI: He has been doing better about getting his biktarvy and bactrim in everyday. No missed doses since we called to inform him his lab results last time of 88K VL and CD4 drop to 130.   Rectal abscess cleared up after 3 days of doxycyline and has continued to be resolved.        01/06/2022    1:40 PM  Depression screen PHQ 2/9  Decreased Interest 0  Down, Depressed, Hopeless 0  PHQ - 2 Score 0    Review of Systems  Constitutional:  Negative for chills and fever.  HENT:  Negative for tinnitus.   Eyes:  Negative for blurred vision and photophobia.  Respiratory:  Negative for cough and sputum production.   Cardiovascular:  Negative for chest pain.  Gastrointestinal:  Negative for diarrhea, nausea and vomiting.       Peri-rectal pain with defecation  Genitourinary:  Negative for dysuria.  Skin:  Negative for rash.  Neurological:  Negative for headaches.    Past Medical History:  Diagnosis Date   HIV (human immunodeficiency virus infection) ([facility])     Outpatient Medications Prior to Visit  Medication Sig Dispense Refill   ARIPiprazole (ABILIFY) 10 MG tablet Take 1 tablet (10 mg total) by mouth daily. 30 tablet 2   bictegravir-emtricitabine-tenofovir AF (BIKTARVY) 50-200-25 MG TABS tablet Take 1 tablet by mouth daily. Try to take at the same time each day with or without food. 30 tablet 5   chlorhexidine (PERIDEX) 0.12 % solution 15 mLs 2 (two) times daily.     doxycycline (VIBRA-TABS) 100 MG  tablet Take 1 tablet (100 mg total) by mouth 2 (two) times daily. 20 tablet 0   escitalopram (LEXAPRO) 20 MG tablet Take 1 tablet (20 mg total) by mouth daily. 30 tablet 3   sulfamethoxazole-trimethoprim (BACTRIM DS) 800-160 MG tablet Take 1 tablet by mouth daily. 30 tablet 2   traZODone (DESYREL) 50 MG tablet Take 1 tablet (50 mg total) by mouth at bedtime as needed for sleep. 30 tablet 3   No facility-administered medications prior to visit.     Allergies  Allergen Reactions   Fish-Derived Products Hives and Nausea And Vomiting    Social History   Tobacco Use   Smoking status: Every Day    Packs/day: 0.50    Types: Cigarettes   Smokeless tobacco: Never  Substance Use Topics   Alcohol use: Not Currently   Drug use: Not Currently    Types: Cocaine    Comment: socially    Social History   Substance and Sexual Activity  Sexual Activity Not Currently   Comment: DECLINED CONDOMS     Objective:   Vitals:   01/06/22 1338  BP: 109/72  Pulse: 92  Temp: 97.7 F (36.5 C)  TempSrc: Oral  Weight: 116 lb (52.6 kg)  Height: 5' 2" (1.575 m)   Body mass index is 21.22 kg/m.  Physical Exam    Lab Results Lab Results  Component Value Date   WBC 3.4 (L) 04/05/2021   HGB 13.2 04/05/2021   HCT 37.8 (L) 04/05/2021   MCV 98.2 04/05/2021   PLT 290 04/05/2021    Lab Results  Component Value Date   CREATININE 0.99 10/29/2021   BUN 12 10/29/2021   NA 138 10/29/2021   K 3.8 10/29/2021   CL 105 10/29/2021   CO2 24 10/29/2021    Lab Results  Component Value Date   ALT 9 10/29/2021   AST 15 10/29/2021   ALKPHOS 61 04/05/2021   BILITOT 0.3 10/29/2021    No results found for: "CHOL", "HDL", "LDLCALC", "LDLDIRECT", "TRIG", "CHOLHDL" HIV 1 RNA Quant (Copies/mL)  Date Value  10/29/2021 88,100 (H)  07/02/2021 48 (H)  04/17/2021 243 (H)   CD4 T Cell Abs (/uL)  Date Value  10/29/2021 130 (L)     Assessment & Plan:   Problem List Items Addressed This Visit        Unprioritized   HIV (human immunodeficiency virus infection) ([facility]) - Primary    Reports he has been doing well again taking biktarvy and bactrim everyday. He hopes to stop the bactrim soon as it causes some dry mouth.   Will repeat HIV RNA today for adherence follow up given he was poorly controlled with CD4 130 last visit 2 months ago.   HPV vaccine #1 today      Relevant Orders   HIV 1 RNA quant-no reflex-bld   T-helper cell (CD4)- ([facility] clinic only)   Healthcare maintenance    Counseled on HPV vaccination. #1 dose given today. Will give second at return visit.       Other Visit Diagnoses     Need for HPV vaccine       Relevant Orders   HPV 9-valent vaccine,Recombinat (Completed)      , MSN, NP-C [facility] Pager: [phone] Office: [phone]  01/06/22  4:09 PM   

## 2022-01-07 LAB — T-HELPER CELL (CD4) - (RCID CLINIC ONLY)
CD4 % Helper T Cell: 18 % — ABNORMAL LOW (ref 33–65)
CD4 T Cell Abs: 203 /uL — ABNORMAL LOW (ref 400–1790)

## 2022-01-08 LAB — HIV-1 RNA QUANT-NO REFLEX-BLD
HIV 1 RNA Quant: 44 Copies/mL — ABNORMAL HIGH
HIV-1 RNA Quant, Log: 1.64 Log cps/mL — ABNORMAL HIGH

## 2022-01-29 ENCOUNTER — Telehealth (INDEPENDENT_AMBULATORY_CARE_PROVIDER_SITE_OTHER): Payer: No Payment, Other | Admitting: Physician Assistant

## 2022-01-29 DIAGNOSIS — G4700 Insomnia, unspecified: Secondary | ICD-10-CM | POA: Diagnosis not present

## 2022-01-29 DIAGNOSIS — F310 Bipolar disorder, current episode hypomanic: Secondary | ICD-10-CM

## 2022-01-29 DIAGNOSIS — F411 Generalized anxiety disorder: Secondary | ICD-10-CM | POA: Diagnosis not present

## 2022-01-29 MED ORDER — ARIPIPRAZOLE 15 MG PO TABS: 15.0000 mg | ORAL_TABLET | Freq: Every day | ORAL | 1 refills | Status: DC

## 2022-01-29 MED ORDER — TRAZODONE HCL 100 MG PO TABS: 100.0000 mg | ORAL_TABLET | Freq: Every evening | ORAL | 1 refills | Status: DC | PRN

## 2022-01-29 MED ORDER — BUSPIRONE HCL 7.5 MG PO TABS: 7.5000 mg | ORAL_TABLET | Freq: Two times a day (BID) | ORAL | 1 refills | Status: AC

## 2022-02-01 ENCOUNTER — Encounter (HOSPITAL_COMMUNITY): Payer: Self-pay | Admitting: Physician Assistant

## 2022-02-01 NOTE — Progress Notes (Signed)
BH MD/PA/NP OP Progress Note  Virtual Visit via Video Note  I connected with Lee Bridges on 01/29/22 at  3:30 PM EDT by a video enabled telemedicine application and verified that I am speaking with the correct person using two identifiers.  Location: Patient: Home Provider: Clinic   I discussed the limitations of evaluation and management by telemedicine and the availability of in person appointments. The patient expressed understanding and agreed to proceed.  Follow Up Instructions:  I discussed the assessment and treatment plan with the patient. The patient was provided an opportunity to ask questions and all were answered. The patient agreed with the plan and demonstrated an understanding of the instructions.   The patient was advised to call back or seek an in-person evaluation if the symptoms worsen or if the condition fails to improve as anticipated.  I provided 23 minutes of non-face-to-face time during this encounter.  Meta Hatchet, PA   01/29/2022 4:00 PM Lee Bridges  MRN:  [id number]  Chief Complaint:  Chief Complaint  Patient presents with   Medication Management   Follow-up   HPI:   Lee Till "[name]" is a 31 year old male with a past psychiatric history significant for bipolar disorder who presents to [facility] via virtual video visit for follow-up and medication management.  Patient is currently being managed on the following medications:  Abilify 10 mg daily Escitalopram 20 mg daily  Patient reports that they have not been having any nightmares since taking trazodone.  They added that they have not been taking Lexapro due to experiencing mouth numbness.  Patient continues to take Abilify and denies experiencing any side effects from the medication.  Patient also endorses stable mood since taking Abilify.  Patient denies depression but does endorse experiencing anxiety.  Patient rates her anxiety an 8 out of  10.  Patient denies experiencing any manic symptoms but does endorse some agitation.  Patient also states that he has been seeing things that are not present.  Patient states that his hallucinations have been going on for a week and a half.  Patient hallucinations are visual and characterized by seeing animals such as rats and snakes.  Patient denies illicit substance use.  Patient denies any new stressors at this time.  Patient also endorses some forgetfulness.  Patient states that his forgetfulness has been going on for 3 weeks and often has to repeat things to himself over and over so that he remembers them.  Patient does express that his health was not well and that his blood count was low.  A PHQ-9 screen was performed with the patient scoring a 23.  A GAD-7 screen was also performed with the patient scoring an 18.  Patient is alert and oriented x4, calm, cooperative, and fully engaged in conversation during the encounter.  Patient states that he is trying to stay, but his mind is racing.  Patient denies suicidal or homicidal ideations.  Patient denies visual hallucinations but does endorse auditory hallucinations characterized by hearing things and someone calling his name.  Patient endorses poor sleep and receives on average 2 to 3 hours of sleep each night.  Patient endorses decreased appetite and states that they can go to whole days without eating.  Patient denies alcohol consumption.  Patient endorses tobacco use and smokes one black and mild a day.  Patient denies illicit drug use.  Visit Diagnosis:    ICD-10-CM   1. Bipolar affective disorder, current episode hypomanic (HCC)  F31.0 ARIPiprazole (ABILIFY) 15 MG tablet    2. Generalized anxiety disorder  F41.1 traZODone (DESYREL) 100 MG tablet    busPIRone (BUSPAR) 7.5 MG tablet    3. Insomnia, unspecified type  G47.00 traZODone (DESYREL) 100 MG tablet      Past Psychiatric History:  Unspecified mood disorder Major depressive  disorder Generalized anxiety disorder  Past Medical History:  Past Medical History:  Diagnosis Date   HIV (human immunodeficiency virus infection) (HCC)    No past surgical history on file.  Family Psychiatric History:  Patient reports that psychiatric illnesses run on his mother side of the family.   Cousin - Bipolar disorder/mood swings Cousin - Schizophrenia  Family History:  Family History  Problem Relation Age of Onset   Arthritis Mother    Diabetes Father    Hypertension Father     Social History:  Social History   Socioeconomic History   Marital status: Single    Spouse name: Not on file   Number of children: Not on file   Years of education: Not on file   Highest education level: Not on file  Occupational History   Not on file  Tobacco Use   Smoking status: Every Day    Packs/day: 0.50    Types: Cigarettes   Smokeless tobacco: Never  Substance and Sexual Activity   Alcohol use: Not Currently   Drug use: Not Currently    Types: Cocaine    Comment: socially   Sexual activity: Not Currently    Comment: DECLINED CONDOMS  Other Topics Concern   Not on file  Social History Narrative   Lives with mother.    Social Determinants of Health   Financial Resource Strain: Not on file  Food Insecurity: Not on file  Transportation Needs: Not on file  Physical Activity: Not on file  Stress: Not on file  Social Connections: Not on file    Allergies:  Allergies  Allergen Reactions   Fish-Derived Products Hives and Nausea And Vomiting    Metabolic Disorder Labs: No results found for: "HGBA1C", "MPG" No results found for: "PROLACTIN" No results found for: "CHOL", "TRIG", "HDL", "CHOLHDL", "VLDL", "LDLCALC" No results found for: "TSH"  Therapeutic Level Labs: No results found for: "LITHIUM" No results found for: "VALPROATE" No results found for: "CBMZ"  Current Medications: Current Outpatient Medications  Medication Sig Dispense Refill   busPIRone  (BUSPAR) 7.5 MG tablet Take 1 tablet (7.5 mg total) by mouth 2 (two) times daily. 60 tablet 1   ARIPiprazole (ABILIFY) 15 MG tablet Take 1 tablet (15 mg total) by mouth daily. 30 tablet 1   bictegravir-emtricitabine-tenofovir AF (BIKTARVY) 50-200-25 MG TABS tablet Take 1 tablet by mouth daily. Try to take at the same time each day with or without food. 30 tablet 5   chlorhexidine (PERIDEX) 0.12 % solution 15 mLs 2 (two) times daily.     doxycycline (VIBRA-TABS) 100 MG tablet Take 1 tablet (100 mg total) by mouth 2 (two) times daily. 20 tablet 0   escitalopram (LEXAPRO) 20 MG tablet Take 1 tablet (20 mg total) by mouth daily. 30 tablet 3   sulfamethoxazole-trimethoprim (BACTRIM DS) 800-160 MG tablet Take 1 tablet by mouth daily. 30 tablet 2   traZODone (DESYREL) 100 MG tablet Take 1 tablet (100 mg total) by mouth at bedtime as needed for sleep. 30 tablet 1   No current facility-administered medications for this visit.     Musculoskeletal: Strength & Muscle Tone: within normal limits Gait & Station:  normal Patient leans: N/A  Psychiatric Specialty Exam: Review of Systems  Psychiatric/Behavioral:  Positive for decreased concentration, hallucinations and sleep disturbance. Negative for dysphoric mood, self-injury and suicidal ideas. The patient is nervous/anxious. The patient is not hyperactive.     There were no vitals taken for this visit.There is no height or weight on file to calculate BMI.  General Appearance: Casual  Eye Contact:  Good  Speech:  Clear and Coherent and Normal Rate  Volume:  Normal  Mood:  Anxious and Euthymic  Affect:  Appropriate and Congruent  Thought Process:  Coherent, Goal Directed, and Descriptions of Associations: Intact  Orientation:  Full (Time, Place, and Person)  Thought Content: WDL and Hallucinations: Auditory   Suicidal Thoughts:  No  Homicidal Thoughts:  No  Memory:  Immediate;   Good Recent;   Good Remote;   Good  Judgement:  Good  Insight:   Good  Psychomotor Activity:  Normal  Concentration:  Concentration: Good and Attention Span: Good  Recall:  Good  Fund of Knowledge: Good  Language: Good  Akathisia:  No  Handed:  Right  AIMS (if indicated): not done  Assets:  Communication Skills Desire for Improvement Housing Social Support  ADL's:  Intact  Cognition: WNL  Sleep:  Poor   Screenings: GAD-7    Flowsheet Row Video Visit from 01/29/2022 in [facility] Video Visit from 10/30/2021 in [facility] Video Visit from 08/28/2021 in [facility] Office Visit from 07/24/2021 in [facility] Video Visit from 06/11/2021 in [facility]  Total GAD-7 Score 18 14 8 16 2       PHQ2-9    Flowsheet Row Video Visit from 01/29/2022 in [facility] Office Visit from 01/06/2022 in [facility] Office Visit from 11/19/2021 in [facility] for [facility]ious Disease Video Visit from 10/30/2021 in [facility] Office Visit from 10/29/2021 in [facility] for Infectious Disease  PHQ-2 Total Score 5 0 0 0 0  PHQ-9 Total Score 23 -- -- -- --      Flowsheet Row Video Visit from 01/29/2022 in [facility] ED from 11/15/2021 in [facility] Video Visit from 10/30/2021 in [facility]  C-SSRS RISK CATEGORY High Risk Error: Question 6 not populated No Risk        Assessment and Plan:   Lee Simpson "[name]" is a 31 year old male with a past psychiatric history significant for bipolar disorder who presents to [facility] via virtual video visit for follow-up and medication management.  Patient reports that they have not been experiencing nightmares through the use of trazodone.  They  have discontinued taking Lexapro due to mouth numbness but reports that there Abilify use continues to go well.  Patient denies depressive symptoms but does endorse anxiety, agitation, forgetfulness, and hallucinations.  Patient's hallucinations have been going on for roughly a week and a half and is unsure of the cause.  Patient was recommended increasing his trazodone from 50 mg to 100 mg at bedtime for the management of his sleep.  Patient was also recommended increasing his Abilify from 10 mg to 15 mg daily for the management of his hallucinations and agitation.  Lastly, patient was recommended buspirone 7.5 mg 2 times daily for the management of their anxiety.  Patient was agreeable to recommendations.  Patient's medications to be e-prescribed to pharmacy of choice.  Collaboration of Care: Collaboration of Care: Medication Management AEB provider managing patient's psychiatric medications, Psychiatrist AEB patient being followed by a mental health provider, Other provider involved in patient's care AEB patient being followed by infectious diseases, and Referral or follow-up with counselor/therapist AEB patient being seen by a counselor  Patient/Guardian was advised Release of Information must be obtained prior to any record release in order to collaborate their care with an outside provider. Patient/Guardian was advised if they have not already done so to contact the registration department to sign all necessary forms in order for [place] to release information regarding their care.   Consent: Patient/Guardian gives verbal consent for treatment and assignment of benefits for services provided during this visit. Patient/Guardian expressed understanding and agreed to proceed.   1. Bipolar affective disorder, current episode hypomanic (HCC)  - ARIPiprazole (ABILIFY) 15 MG tablet; Take 1 tablet (15 mg total) by mouth daily.  Dispense: 30 tablet; Refill: 1  2. Generalized anxiety disorder  - traZODone  (DESYREL) 100 MG tablet; Take 1 tablet (100 mg total) by mouth at bedtime as needed for sleep.  Dispense: 30 tablet; Refill: 1 - busPIRone (BUSPAR) 7.5 MG tablet; Take 1 tablet (7.5 mg total) by mouth 2 (two) times daily.  Dispense: 60 tablet; Refill: 1  3. Insomnia, unspecified type  - traZODone (DESYREL) 100 MG tablet; Take 1 tablet (100 mg total) by mouth at bedtime as needed for sleep.  Dispense: 30 tablet; Refill: 1  Patient to follow up in 6 weeks Provider spent a total of 23 minutes with the patient/reviewing patient's chart  Meta Hatchet, PA 10/30/2021, 3:58 PM

## 2022-02-10 ENCOUNTER — Telehealth (HOSPITAL_COMMUNITY): Payer: Self-pay | Admitting: *Deleted

## 2022-02-10 ENCOUNTER — Other Ambulatory Visit (HOSPITAL_COMMUNITY): Payer: Self-pay | Admitting: *Deleted

## 2022-02-10 DIAGNOSIS — F310 Bipolar disorder, current episode hypomanic: Secondary | ICD-10-CM

## 2022-02-10 MED ORDER — ARIPIPRAZOLE 15 MG PO TABS: 15.0000 mg | ORAL_TABLET | Freq: Every day | ORAL | 1 refills | Status: DC

## 2022-02-10 NOTE — Telephone Encounter (Signed)
Call from [facility] re having patients medicine moved to [facility] on [address] as it is much cheaper there using [facility] than at the [facility] where it was initially called into. Rx moved at his request. He is patients case Production designer, theatre/television/film.

## 2022-02-25 ENCOUNTER — Ambulatory Visit: Payer: Self-pay | Admitting: Infectious Diseases

## 2022-02-26 ENCOUNTER — Encounter (HOSPITAL_COMMUNITY): Payer: Self-pay

## 2022-03-12 ENCOUNTER — Telehealth (HOSPITAL_COMMUNITY): Payer: No Payment, Other | Admitting: Physician Assistant

## 2022-03-19 ENCOUNTER — Other Ambulatory Visit (HOSPITAL_COMMUNITY): Payer: Self-pay | Admitting: Physician Assistant

## 2022-03-19 DIAGNOSIS — F411 Generalized anxiety disorder: Secondary | ICD-10-CM

## 2022-03-19 DIAGNOSIS — F332 Major depressive disorder, recurrent severe without psychotic features: Secondary | ICD-10-CM

## 2022-04-14 ENCOUNTER — Ambulatory Visit: Payer: Self-pay | Admitting: Infectious Diseases

## 2022-08-17 ENCOUNTER — Other Ambulatory Visit: Payer: Self-pay

## 2022-08-17 DIAGNOSIS — Z21 Asymptomatic human immunodeficiency virus [HIV] infection status: Secondary | ICD-10-CM

## 2022-08-17 MED ORDER — BICTEGRAVIR-EMTRICITAB-TENOFOV 50-200-25 MG PO TABS: 1.0000 | ORAL_TABLET | Freq: Every day | ORAL | 0 refills | Status: DC

## 2022-09-13 ENCOUNTER — Other Ambulatory Visit: Payer: Self-pay | Admitting: Infectious Diseases

## 2022-09-13 ENCOUNTER — Telehealth: Payer: Self-pay

## 2022-09-13 DIAGNOSIS — Z21 Asymptomatic human immunodeficiency virus [HIV] infection status: Secondary | ICD-10-CM

## 2022-09-13 NOTE — Telephone Encounter (Signed)
Called patient regarding refill request. Patient is past due for appointment. Will send 30 day supply to pharmacy, but patient will need to be seen for additional refills. -Annelisa Ryback

## 2022-11-15 ENCOUNTER — Other Ambulatory Visit: Payer: Self-pay | Admitting: Infectious Diseases

## 2022-11-15 ENCOUNTER — Other Ambulatory Visit (HOSPITAL_COMMUNITY): Payer: Self-pay | Admitting: Physician Assistant

## 2022-11-15 DIAGNOSIS — F411 Generalized anxiety disorder: Secondary | ICD-10-CM

## 2022-11-15 DIAGNOSIS — L0291 Cutaneous abscess, unspecified: Secondary | ICD-10-CM

## 2022-11-15 DIAGNOSIS — F39 Unspecified mood [affective] disorder: Secondary | ICD-10-CM

## 2022-11-16 ENCOUNTER — Other Ambulatory Visit: Payer: Self-pay | Admitting: Infectious Diseases

## 2022-11-16 ENCOUNTER — Other Ambulatory Visit: Payer: Self-pay | Admitting: Pharmacist

## 2022-11-16 ENCOUNTER — Other Ambulatory Visit (HOSPITAL_COMMUNITY): Payer: Self-pay | Admitting: Physician Assistant

## 2022-11-16 DIAGNOSIS — B20 Human immunodeficiency virus [HIV] disease: Secondary | ICD-10-CM

## 2022-11-16 DIAGNOSIS — F411 Generalized anxiety disorder: Secondary | ICD-10-CM

## 2022-11-16 DIAGNOSIS — G4700 Insomnia, unspecified: Secondary | ICD-10-CM

## 2022-11-16 DIAGNOSIS — Z21 Asymptomatic human immunodeficiency virus [HIV] infection status: Secondary | ICD-10-CM

## 2022-11-16 MED ORDER — SULFAMETHOXAZOLE-TRIMETHOPRIM 800-160 MG PO TABS: 1.0000 | ORAL_TABLET | Freq: Every day | ORAL | 0 refills | Status: DC

## 2022-11-16 NOTE — Telephone Encounter (Signed)
Per 12/2021 lab result note, patient's CD4 count is over 200 and he no longer needs Bactrim.   Sandie Ano, RN

## 2022-11-19 ENCOUNTER — Telehealth (INDEPENDENT_AMBULATORY_CARE_PROVIDER_SITE_OTHER): Payer: No Payment, Other | Admitting: Physician Assistant

## 2022-11-19 DIAGNOSIS — F310 Bipolar disorder, current episode hypomanic: Secondary | ICD-10-CM

## 2022-11-19 DIAGNOSIS — G4700 Insomnia, unspecified: Secondary | ICD-10-CM | POA: Diagnosis not present

## 2022-11-19 DIAGNOSIS — F411 Generalized anxiety disorder: Secondary | ICD-10-CM

## 2022-11-19 MED ORDER — OLANZAPINE 5 MG PO TABS
ORAL_TABLET | ORAL | 0 refills | Status: DC
Start: 2022-11-19 — End: 2022-12-28

## 2022-11-19 NOTE — Progress Notes (Unsigned)
BH MD/PA/NP OP Progress Note  11/19/2022 6:28 PM Lee Bridges  MRN:  [id number]  Chief Complaint:  Chief Complaint  Patient presents with   Follow-up   Medication Management   HPI: ***  Lee Griffee "[name]"  Visit Diagnosis:    ICD-10-CM   1. Bipolar affective disorder, current episode hypomanic (HCC)  F31.0 OLANZapine (ZYPREXA) 5 MG tablet    2. Generalized anxiety disorder  F41.1     3. Insomnia, unspecified type  G47.00       Past Psychiatric History:  Unspecified mood disorder Major depressive disorder Generalized anxiety disorder  Past Medical History:  Past Medical History:  Diagnosis Date   HIV (human immunodeficiency virus infection) (HCC)    No past surgical history on file.  Family Psychiatric History:  Patient reports that psychiatric illnesses run on his mother side of the family.   Cousin - Bipolar disorder/mood swings Cousin - Schizophrenia  Family History:  Family History  Problem Relation Age of Onset   Arthritis Mother    Diabetes Father    Hypertension Father     Social History:  Social History   Socioeconomic History   Marital status: Single    Spouse name: Not on file   Number of children: Not on file   Years of education: Not on file   Highest education level: Not on file  Occupational History   Not on file  Tobacco Use   Smoking status: Every Day    Packs/day: .5    Types: Cigarettes   Smokeless tobacco: Never  Substance and Sexual Activity   Alcohol use: Not Currently   Drug use: Not Currently    Types: Cocaine    Comment: socially   Sexual activity: Not Currently    Comment: DECLINED CONDOMS  Other Topics Concern   Not on file  Social History Narrative   Lives with mother.    Social Determinants of Health   Financial Resource Strain: Not on file  Food Insecurity: Not on file  Transportation Needs: Not on file  Physical Activity: Not on file  Stress: Not on file  Social Connections: Not on file    Allergies:   Allergies  Allergen Reactions   Fish-Derived Products Hives and Nausea And Vomiting    Metabolic Disorder Labs: No results found for: "HGBA1C", "MPG" No results found for: "PROLACTIN" No results found for: "CHOL", "TRIG", "HDL", "CHOLHDL", "VLDL", "LDLCALC" No results found for: "TSH"  Therapeutic Level Labs: No results found for: "LITHIUM" No results found for: "VALPROATE" No results found for: "CBMZ"  Current Medications: Current Outpatient Medications  Medication Sig Dispense Refill   OLANZapine (ZYPREXA) 5 MG tablet Take 1 tablet (5 mg total) by mouth at bedtime for 4 days, THEN 2 tablets (10 mg total) at bedtime. 116 tablet 0   BIKTARVY 50-200-25 MG TABS tablet TAKE 1 TABLET BY MOUTH DAILY AT THE SAME TIME EVERY DAY WITH OR WITHOUT FOOD 30 tablet 0   busPIRone (BUSPAR) 7.5 MG tablet Take 1 tablet (7.5 mg total) by mouth 2 (two) times daily. 60 tablet 1   chlorhexidine (PERIDEX) 0.12 % solution 15 mLs 2 (two) times daily.     escitalopram (LEXAPRO) 20 MG tablet Take 1 tablet (20 mg total) by mouth daily. 30 tablet 3   sulfamethoxazole-trimethoprim (BACTRIM DS) 800-160 MG tablet Take 1 tablet by mouth daily. 30 tablet 0   traZODone (DESYREL) 100 MG tablet Take 1 tablet (100 mg total) by mouth at bedtime as needed for sleep. 30  tablet 1   No current facility-administered medications for this visit.     Musculoskeletal: Strength & Muscle Tone: within normal limits Gait & Station: normal Patient leans: N/A  Psychiatric Specialty Exam: Review of Systems  Psychiatric/Behavioral:  Positive for sleep disturbance. Negative for decreased concentration, dysphoric mood, hallucinations, self-injury and suicidal ideas. The patient is nervous/anxious and is hyperactive.     There were no vitals taken for this visit.There is no height or weight on file to calculate BMI.  General Appearance: Casual  Eye Contact:  Good  Speech:  Clear and Coherent and Normal Rate  Volume:  Normal   Mood:  Anxious and Depressed  Affect:  Congruent  Thought Process:  Coherent, Goal Directed, and Descriptions of Associations: Intact  Orientation:  Full (Time, Place, and Person)  Thought Content: WDL   Suicidal Thoughts:  No  Homicidal Thoughts:  No  Memory:  Immediate;   Good Recent;   Good Remote;   Good  Judgement:  Good  Insight:  Good  Psychomotor Activity:  Normal  Concentration:  Concentration: Good and Attention Span: Good  Recall:  Good  Fund of Knowledge: Good  Language: Good  Akathisia:  No  Handed:  Right  AIMS (if indicated): not done  Assets:  Communication Skills Desire for Improvement Housing Social Support  ADL's:  Intact  Cognition: WNL  Sleep:  Poor   Screenings: GAD-7    Flowsheet Row Video Visit from 11/19/2022 in [facility] Video Visit from 01/29/2022 in [facility] Video Visit from 10/30/2021 in [facility] Video Visit from 08/28/2021 in [facility] Office Visit from 07/24/2021 in [facility]  Total GAD-7 Score 19 18 14 8 16       PHQ2-9    Flowsheet Row Video Visit from 11/19/2022 in [facility] Video Visit from 01/29/2022 in [facility] Office Visit from 01/06/2022 in [facility] for Infectious Disease Office Visit from 11/19/2021 in [facility] for [facility] Disease Video Visit from 10/30/2021 in [facility]  PHQ-2 Total Score 4 5 0 0 0  PHQ-9 Total Score 23 23 -- -- --      Flowsheet Row Video Visit from 11/19/2022 in [facility] Video Visit from 01/29/2022 in [facility] ED from 11/15/2021 in [facility])  C-SSRS RISK CATEGORY High Risk High Risk Error: Question 6 not populated         Assessment and Plan: ***    Collaboration of Care: Collaboration of Care: Medication Management AEB provider managing patient's psychiatric medications, Psychiatrist AEB patient being followed by mental health provider at this facility, and Other provider involved in patient's care AEB patient being followed by infectious diseases  Patient/Guardian was advised Release of Information must be obtained prior to any record release in order to collaborate their care with an outside provider. Patient/Guardian was advised if they have not already done so to contact the registration department to sign all necessary forms in order for [place] to release information regarding their care.   Consent: Patient/Guardian gives verbal consent for treatment and assignment of benefits for services provided during this visit. Patient/Guardian expressed understanding and agreed to proceed.   1. Bipolar affective disorder, current episode hypomanic (HCC)  - OLANZapine (ZYPREXA) 5 MG tablet; Take 1 tablet (5 mg total) by  mouth at bedtime for 4 days, THEN 2 tablets (10 mg total) at bedtime.  Dispense: 116 tablet; Refill: 0  2. Generalized anxiety disorder  3. Insomnia, unspecified type  Patient to follow-up in 6 weeks Provider spent a total of 24 minutes with the patient/reviewing patient's chart  Meta Hatchet, PA 11/19/2022, 6:28 PM

## 2022-11-22 ENCOUNTER — Ambulatory Visit: Payer: Self-pay | Admitting: Physician Assistant

## 2022-11-22 ENCOUNTER — Encounter (HOSPITAL_COMMUNITY): Payer: Self-pay | Admitting: Physician Assistant

## 2022-12-01 ENCOUNTER — Ambulatory Visit: Payer: Self-pay | Admitting: Infectious Diseases

## 2022-12-23 ENCOUNTER — Encounter: Payer: Self-pay | Admitting: Infectious Diseases

## 2022-12-23 ENCOUNTER — Ambulatory Visit (INDEPENDENT_AMBULATORY_CARE_PROVIDER_SITE_OTHER): Payer: Self-pay | Admitting: Infectious Diseases

## 2022-12-23 ENCOUNTER — Other Ambulatory Visit: Payer: Self-pay

## 2022-12-23 VITALS — BP 131/93 | HR 99 | Temp 98.0°F | Ht 62.0 in | Wt 120.0 lb

## 2022-12-23 DIAGNOSIS — Z113 Encounter for screening for infections with a predominantly sexual mode of transmission: Secondary | ICD-10-CM

## 2022-12-23 DIAGNOSIS — B20 Human immunodeficiency virus [HIV] disease: Secondary | ICD-10-CM

## 2022-12-23 DIAGNOSIS — Z21 Asymptomatic human immunodeficiency virus [HIV] infection status: Secondary | ICD-10-CM

## 2022-12-23 DIAGNOSIS — R634 Abnormal weight loss: Secondary | ICD-10-CM

## 2022-12-23 DIAGNOSIS — R443 Hallucinations, unspecified: Secondary | ICD-10-CM

## 2022-12-23 MED ORDER — BIKTARVY 50-200-25 MG PO TABS
1.0000 | ORAL_TABLET | Freq: Every day | ORAL | 11 refills | Status: DC
Start: 2022-12-23 — End: 2023-08-03

## 2022-12-23 NOTE — Patient Instructions (Signed)
Nice to see you  Please continue taking your [place] everyday.   I think we will be able to stop the bactrim also - but I will call you with that once labs are back.

## 2022-12-23 NOTE — Progress Notes (Signed)
Name: Lee Bridges  DOB: [birth year] MRN: [id number] PCP: Pcp, No    Brief Narrative:  Lee Bridges is a 32 y.o. male with HIV disease, Dx 10/17/2014. Never on medications.  CD4 nadir 180 VL unknown HIV Risk: sexual  History of OIs: none Intake Labs 03/20/21: Hep B sAg (-), sAb (-), cAb (-); Hep A (-), Hep C (-) Quantiferon () HLA B*5701 () G6PD: ()   Previous Regimens: Biktarvy    Genotypes: Pending   Subjective:   Chief Complaint  Patient presents with   Follow-up      HPI: Has had a hard time with logging in to the MyChart App.  Mood is up and down - but largely feels better. Worried about the olanzapine - hearing voices around him - more like muffled conversation around him not specific clear voices directed back to him. He has a plan to call his psychiatry team to discuss this but has not yet reached out.  He says has been doing better taking his Biktarvy every day.  He says he has been on it pretty consistently last month.  He is a little confused just with the Bactrim purposes but feels like it makes his stomach burning sensation but also feels like this is helping his HIV.  He has noticed some pains in the feet really feel heavy but also numb.  Sexual health - has some pruritus on the scrotum, no visual rashes, no urinary symptoms.  No dysuria.       12/23/2022   10:38 AM  Depression screen PHQ 2/9  Decreased Interest 0  Down, Depressed, Hopeless 0  PHQ - 2 Score 0    Review of Systems  Constitutional:  Negative for chills, fever, malaise/fatigue and weight loss.  All other systems reviewed and are negative.   Past Medical History:  Diagnosis Date   HIV (human immunodeficiency virus infection) (HCC)     Outpatient Medications Prior to Visit  Medication Sig Dispense Refill   OLANZapine (ZYPREXA) 5 MG tablet Take 1 tablet (5 mg total) by mouth at bedtime for 4 days, THEN 2 tablets (10 mg total) at bedtime. 116 tablet 0   sulfamethoxazole-trimethoprim  (BACTRIM DS) 800-160 MG tablet Take 1 tablet by mouth daily. 30 tablet 0   traZODone (DESYREL) 100 MG tablet Take 1 tablet (100 mg total) by mouth at bedtime as needed for sleep. 30 tablet 1   BIKTARVY 50-200-25 MG TABS tablet TAKE 1 TABLET BY MOUTH DAILY AT THE SAME TIME EVERY DAY WITH OR WITHOUT FOOD 30 tablet 0   busPIRone (BUSPAR) 7.5 MG tablet Take 1 tablet (7.5 mg total) by mouth 2 (two) times daily. (Patient not taking: Reported on 12/23/2022) 60 tablet 1   chlorhexidine (PERIDEX) 0.12 % solution 15 mLs 2 (two) times daily. (Patient not taking: Reported on 12/23/2022)     escitalopram (LEXAPRO) 20 MG tablet Take 1 tablet (20 mg total) by mouth daily. 30 tablet 3   No facility-administered medications prior to visit.     Allergies  Allergen Reactions   Fish-Derived Products Hives and Nausea And Vomiting    Social History   Tobacco Use   Smoking status: Every Day    Current packs/day: 0.50    Types: Cigarettes   Smokeless tobacco: Never  Substance Use Topics   Alcohol use: Not Currently   Drug use: Not Currently    Types: Cocaine    Comment: socially    Social History   Substance and Sexual Activity  Sexual Activity Not Currently   Comment: DECLINED CONDOMS     Objective:   Vitals:   12/23/22 1038  BP: (!) 131/93  Pulse: 99  Temp: 98 F (36.7 C)  TempSrc: Oral  SpO2: 99%  Weight: 120 lb (54.4 kg)  Height: 5\' 2"  (1.575 m)   Body mass index is 21.95 kg/m.  Physical Exam Constitutional:      Appearance: Normal appearance. He is not ill-appearing.  HENT:     Head: Normocephalic.     Mouth/Throat:     Mouth: Mucous membranes are moist.     Pharynx: Oropharynx is clear.  Eyes:     General: No scleral icterus. Cardiovascular:     Rate and Rhythm: Normal rate and regular rhythm.  Pulmonary:     Effort: Pulmonary effort is normal.  Musculoskeletal:        General: Normal range of motion.     Cervical back: Normal range of motion.  Skin:    Capillary  Refill: Capillary refill takes less than 2 seconds.     Coloration: Skin is not jaundiced or pale.  Neurological:     Mental Status: He is alert and oriented to person, place, and time.  Psychiatric:        Mood and Affect: Mood normal.        Judgment: Judgment normal.     Lab Results Lab Results  Component Value Date   WBC 3.4 (L) 04/05/2021   HGB 13.2 04/05/2021   HCT 37.8 (L) 04/05/2021   MCV 98.2 04/05/2021   PLT 290 04/05/2021    Lab Results  Component Value Date   CREATININE 0.99 10/29/2021   BUN 12 10/29/2021   NA 138 10/29/2021   K 3.8 10/29/2021   CL 105 10/29/2021   CO2 24 10/29/2021    Lab Results  Component Value Date   ALT 9 10/29/2021   AST 15 10/29/2021   ALKPHOS 61 04/05/2021   BILITOT 0.3 10/29/2021    No results found for: "CHOL", "HDL", "LDLCALC", "LDLDIRECT", "TRIG", "CHOLHDL" HIV 1 RNA Quant (Copies/mL)  Date Value  01/06/2022 44 (H)  10/29/2021 88,100 (H)  07/02/2021 48 (H)   CD4 T Cell Abs (/uL)  Date Value  01/06/2022 203 (L)  10/29/2021 130 (L)     Assessment & Plan:   Problem List Items Addressed This Visit       Unprioritized   Weight loss, unintentional    Wt Readings from Last 3 Encounters:  12/23/22 120 lb (54.4 kg)  01/06/22 116 lb (52.6 kg)  11/19/21 119 lb (54 kg)   Jonavin's weight has been uptrending/stable since we last saw him.  He actually looks quite healthy today and I am very pleased with how he looks      HIV (human immunodeficiency virus infection) (HCC) (Chronic)    Ebb has had some trouble with intermittent adherence for prolonged time frames with his Biktarvy.  He has only had 2 fills this year 1 in March and 1 in June.  He says he has been taking it at least for the last month consistently when his family told him that he needs to get back on his medication.  He is looking ill again.  We spent time discussing that the medication is meant to take continuously in any dose interaction provides an inability for  him to fully recover from this infection. We tried to discuss what the reasoning is behind the Bactrim.  Will keep it going for now  given he is only had 2 fills in the 7 months probably time for his immune system to more robustly improved.  Asymptomatic sexual health screening collected today. Return in about 4 months (around 04/25/2023).        Relevant Medications   bictegravir-emtricitabine-tenofovir AF (BIKTARVY) 50-200-25 MG TABS tablet   Other Relevant Orders   HIV 1 RNA quant-no reflex-bld   T-helper cells (CD4) count   Hallucinations    Not explaining all of his psychiatric diagnoses but he has reported some elucidation to me in the past.  Not quite sure if it is all medication related but will forward to his like I addressed.  I recommended that Quinzell reach out to them so they can adjust medications if needed.      Other Visit Diagnoses     Routine screening for STI (sexually transmitted infection)    -  Primary   Relevant Orders   Cytology (oral, anal, urethral) ancillary only   Cytology (oral, anal, urethral) ancillary only   RPR      Rexene Alberts, MSN, NP-C [facility] for Infectious Disease [facility] Pager: [phone] Office: [phone]  12/23/22  1:34 PM

## 2022-12-23 NOTE — Assessment & Plan Note (Signed)
Demetri has had some trouble with intermittent adherence for prolonged time frames with his Biktarvy.  He has only had 2 fills this year 1 in March and 1 in June.  He says he has been taking it at least for the last month consistently when his family told him that he needs to get back on his medication.  He is looking ill again.  We spent time discussing that the medication is meant to take continuously in any dose interaction provides an inability for him to fully recover from this infection. We tried to discuss what the reasoning is behind the Bactrim.  Will keep it going for now given he is only had 2 fills in the 7 months probably time for his immune system to more robustly improved.  Asymptomatic sexual health screening collected today. Return in about 4 months (around 04/25/2023).

## 2022-12-23 NOTE — Assessment & Plan Note (Signed)
Not explaining all of his psychiatric diagnoses but he has reported some elucidation to me in the past.  Not quite sure if it is all medication related but will forward to his like I addressed.  I recommended that Lee Bridges reach out to them so they can adjust medications if needed.

## 2022-12-23 NOTE — Assessment & Plan Note (Signed)
Wt Readings from Last 3 Encounters:  12/23/22 120 lb (54.4 kg)  01/06/22 116 lb (52.6 kg)  11/19/21 119 lb (54 kg)   Lee Bridges's weight has been uptrending/stable since we last saw him.  He actually looks quite healthy today and I am very pleased with how he looks

## 2022-12-28 ENCOUNTER — Encounter (HOSPITAL_COMMUNITY): Payer: Self-pay | Admitting: Physician Assistant

## 2022-12-28 ENCOUNTER — Encounter (HOSPITAL_COMMUNITY): Payer: Self-pay

## 2022-12-28 ENCOUNTER — Telehealth (INDEPENDENT_AMBULATORY_CARE_PROVIDER_SITE_OTHER): Payer: No Payment, Other | Admitting: Physician Assistant

## 2022-12-28 DIAGNOSIS — F3132 Bipolar disorder, current episode depressed, moderate: Secondary | ICD-10-CM | POA: Diagnosis not present

## 2022-12-28 MED ORDER — QUETIAPINE FUMARATE 50 MG PO TABS: 50.0000 mg | ORAL_TABLET | Freq: Every day | ORAL | 1 refills | Status: DC

## 2022-12-28 NOTE — Progress Notes (Unsigned)
BH MD/PA/NP OP Progress Note  Virtual Visit via Video Note  I connected with Lee Bridges on 12/28/22 at 10:30 AM EDT by a video enabled telemedicine application and verified that I am speaking with the correct person using two identifiers.  Location: Patient: Home Provider: Clinic   I discussed the limitations of evaluation and management by telemedicine and the availability of in person appointments. The patient expressed understanding and agreed to proceed.  Follow Up Instructions:  I discussed the assessment and treatment plan with the patient. The patient was provided an opportunity to ask questions and all were answered. The patient agreed with the plan and demonstrated an understanding of the instructions.   The patient was advised to call back or seek an in-person evaluation if the symptoms worsen or if the condition fails to improve as anticipated.  I provided 23 minutes of non-face-to-face time during this encounter.  Meta Hatchet, PA    12/28/2022 1:58 PM Lee Bridges  MRN:  [id number]  Chief Complaint:  Chief Complaint  Patient presents with   Follow-up   Medication Management   HPI:   Lee Garciaperez "Bridges" is a 32 year old, African-American male with a past psychiatric history significant for bipolar disorder, generalized anxiety disorder, and insomnia who presents to [facility] via virtual video visit for follow-up and medication management.  Patient is currently being managed on the following psychiatric medication:  Olanzapine 10 mg at bedtime  Patient presents today encounter stating that he has not been taking his medication.  He reports that he has not taken his olanzapine in a week.  Patient reports that he has not been taking his medication due to experiencing hallucinations.  Patient reports that he has been experiencing hallucinations since his last prescription of olanzapine was filled.  In addition to breakthrough  hallucinations symptoms, patient reports that both trazodone and olanzapine make him sleepy throughout the day.  Writer informed patient that he was not prescribed trazodone during his last encounter.  Patient informed provider that he had an old prescription of trazodone that he was using since the last encounter.  In regard to his hallucinations, patient reports that he is hearing voices as well as seeing things.  Patient's visual hallucinations are characterized by seeing reptiles.  He reports that he has been experiencing hallucinations since the last time his prescriptions were filled.  Patient endorses depression and rates his depression an 8 out of 10 with 10 being most severe.  Patient states that his depressive episodes depends on his mood.  He reports that his depressive episodes occur every other day.  Patient endorses the following depressive symptoms: feelings of sadness, lack of motivation, decreased concentration, irritability, and feelings of worthlessness/guilt.  In addition to depression, patient endorses anxiety and states that his anxiety is often accompanied by sweating of his palms.  A PHQ-9 screen was performed with the patient scoring a 24.  A GAD-7 screen was also performed with the patient scoring a 19.  Patient is alert and oriented x 4, calm, cooperative, and fully engaged in conversation during the encounter.  Patient describes his mood is very sad, tired, and depressed.  Patient denies suicidal or homicidal ideations.  He further denies auditory or visual hallucinations and does not appear to be responding to internal/external stimuli.  Patient endorses poor sleep and receives on average 2 hours of sleep per night.  Patient states increased appetite and eats on average 3 meals per day.  Patient endorses alcohol  consumption.  Patient endorses tobacco use and states that his usage has increased recently.  Patient reports that he smokes on average a pack per day.  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  Visit Diagnosis:    ICD-10-CM   1. Bipolar affective disorder, currently depressed, moderate (HCC)  F31.32 QUEtiapine (SEROQUEL) 50 MG tablet       Past Psychiatric History:  Unspecified mood disorder Major depressive disorder Generalized anxiety disorder  Past Medical History:  Past Medical History:  Diagnosis Date   HIV (human immunodeficiency virus infection) (HCC)    History reviewed. No pertinent surgical history.  Family Psychiatric History:  Patient reports that psychiatric illnesses run on his mother side of the family.   Cousin - Bipolar disorder/mood swings Cousin - Schizophrenia  Family History:  Family History  Problem Relation Age of Onset   Arthritis Mother    Diabetes Father    Hypertension Father     Social History:  Social History   Socioeconomic History   Marital status: Single    Spouse name: Not on file   Number of children: Not on file   Years of education: Not on file   Highest education level: Not on file  Occupational History   Not on file  Tobacco Use   Smoking status: Every Day    Current packs/day: 0.50    Types: Cigarettes   Smokeless tobacco: Never  Substance and Sexual Activity   Alcohol use: Not Currently   Drug use: Not Currently    Types: Cocaine    Comment: socially   Sexual activity: Not Currently    Comment: DECLINED CONDOMS  Other Topics Concern   Not on file  Social History Narrative   Lives with mother.    Social Determinants of Health   Financial Resource Strain: Not on file  Food Insecurity: Not on file  Transportation Needs: Not on file  Physical Activity: Not on file  Stress: Not on file  Social Connections: Not on file    Allergies:  Allergies  Allergen Reactions   Fish-Derived Products Hives and Nausea And Vomiting    Metabolic Disorder Labs: No results found for: "HGBA1C", "MPG" No results found for: "PROLACTIN" No results found for: "CHOL",  "TRIG", "HDL", "CHOLHDL", "VLDL", "LDLCALC" No results found for: "TSH"  Therapeutic Level Labs: No results found for: "LITHIUM" No results found for: "VALPROATE" No results found for: "CBMZ"  Current Medications: Current Outpatient Medications  Medication Sig Dispense Refill   QUEtiapine (SEROQUEL) 50 MG tablet Take 1 tablet (50 mg total) by mouth at bedtime. 30 tablet 1   bictegravir-emtricitabine-tenofovir AF (BIKTARVY) 50-200-25 MG TABS tablet Take 1 tablet by mouth daily. 30 tablet 11   busPIRone (BUSPAR) 7.5 MG tablet Take 1 tablet (7.5 mg total) by mouth 2 (two) times daily. (Patient not taking: Reported on 12/23/2022) 60 tablet 1   chlorhexidine (PERIDEX) 0.12 % solution 15 mLs 2 (two) times daily. (Patient not taking: Reported on 12/23/2022)     sulfamethoxazole-trimethoprim (BACTRIM DS) 800-160 MG tablet Take 1 tablet by mouth daily. 30 tablet 0   traZODone (DESYREL) 100 MG tablet Take 1 tablet (100 mg total) by mouth at bedtime as needed for sleep. 30 tablet 1   No current facility-administered medications for this visit.     Musculoskeletal: Strength & Muscle Tone: within normal limits Gait & Station: normal Patient leans: N/A  Psychiatric Specialty Exam: Review of Systems  Psychiatric/Behavioral:  Positive for sleep disturbance. Negative for  decreased concentration, dysphoric mood, hallucinations, self-injury and suicidal ideas. The patient is nervous/anxious and is hyperactive.     There were no vitals taken for this visit.There is no height or weight on file to calculate BMI.  General Appearance: Casual  Eye Contact:  Good  Speech:  Clear and Coherent and Normal Rate  Volume:  Normal  Mood:  Anxious and Depressed  Affect:  Congruent  Thought Process:  Coherent, Goal Directed, and Descriptions of Associations: Intact  Orientation:  Full (Time, Place, and Person)  Thought Content: WDL   Suicidal Thoughts:  No  Homicidal Thoughts:  No  Memory:  Immediate;    Good Recent;   Good Remote;   Good  Judgement:  Good  Insight:  Good  Psychomotor Activity:  Normal  Concentration:  Concentration: Good and Attention Span: Good  Recall:  Good  Fund of Knowledge: Good  Language: Good  Akathisia:  No  Handed:  Right  AIMS (if indicated): not done  Assets:  Communication Skills Desire for Improvement Housing Social Support  ADL's:  Intact  Cognition: WNL  Sleep:  Poor   Screenings: GAD-7    Flowsheet Row Video Visit from 12/28/2022 in [facility] Video Visit from 11/19/2022 in [facility] Video Visit from 01/29/2022 in [facility] Video Visit from 10/30/2021 in [facility] Video Visit from 08/28/2021 in [facility]  Total GAD-7 Score 19 19 18 14 8       PHQ2-9    Flowsheet Row Video Visit from 12/28/2022 in [facility] Office Visit from 12/23/2022 in [facility] for Infectious Disease Video Visit from 11/19/2022 in [facility] Video Visit from 01/29/2022 in [facility] Office Visit from 01/06/2022 in [facility] for Infectious Disease  PHQ-2 Total Score 5 0 4 5 0  PHQ-9 Total Score 24 -- 23 23 --      Flowsheet Row Video Visit from 12/28/2022 in [facility] Video Visit from 11/19/2022 in [facility] Video Visit from 01/29/2022 in [facility]  C-SSRS RISK CATEGORY Low Risk High Risk High Risk        Assessment and Plan:   Avett Carazo "Bridges" is a 32 year old, African-American male with a past psychiatric history significant for bipolar disorder, generalized anxiety disorder, and insomnia who presents to [facility] via virtual video visit for follow-up and medication  management.  Patient presents to the encounter stating that he has been experiencing hallucinations since his last prescription of olanzapine was filled.  Patient endorses auditory/visual hallucinations.  Patient's auditory hallucinations are characterized by hearing voices.  Patient's visual hallucinations are characterized by seeing reptiles.  Patient states that his use of olanzapine has been ineffective in managing his symptoms and states that the last time he took olanzapine was roughly over a week ago.  In addition to his hallucinations, patient also endorses depressive symptoms and anxiety.  In addition to being on olanzapine, patient has a past history of being on trazodone and Abilify for the management of his symptoms.  Provider recommended patient being placed on Seroquel 50 mg at bedtime for the management of his auditory/visual hallucinations and for mood stability.  Patient was agreeable to recommendation.  Patient medications to be e-prescribed to pharmacy of choice.  Collaboration of Care: Collaboration of Care: Medication  Management AEB provider managing patient's psychiatric medications, Psychiatrist AEB patient being followed by mental health provider at this facility, and Other provider involved in patient's care AEB patient being followed by infectious diseases  Patient/Guardian was advised Release of Information must be obtained prior to any record release in order to collaborate their care with an outside provider. Patient/Guardian was advised if they have not already done so to contact the registration department to sign all necessary forms in order for [place] to release information regarding their care.   Consent: Patient/Guardian gives verbal consent for treatment and assignment of benefits for services provided during this visit. Patient/Guardian expressed understanding and agreed to proceed.   1. Bipolar affective disorder, currently depressed, moderate (HCC)  - QUEtiapine  (SEROQUEL) 50 MG tablet; Take 1 tablet (50 mg total) by mouth at bedtime.  Dispense: 30 tablet; Refill: 1  Patient to follow-up in 6 weeks Provider spent a total of 23 minutes with the patient/reviewing patient's chart  Meta Hatchet, PA 12/28/2022, 1:58 PM

## 2023-01-03 MED ORDER — SULFAMETHOXAZOLE-TRIMETHOPRIM 800-160 MG PO TABS: 1.0000 | ORAL_TABLET | Freq: Every day | ORAL | 3 refills | Status: DC

## 2023-01-03 NOTE — Progress Notes (Signed)
Immune system low with uncontrolled HIV - restart Bactrim prophy. Will try to get him back in 6 weeks with pharmacy team for adherence check in.

## 2023-01-03 NOTE — Addendum Note (Signed)
Addended by: Blanchard Kelch on: 01/03/2023 09:21 AM   Modules accepted: Orders

## 2023-01-19 ENCOUNTER — Telehealth: Payer: Self-pay

## 2023-01-19 NOTE — Telephone Encounter (Signed)
Called pt said could not be completed as dialed.  Need income information to complete financial application.

## 2023-02-08 ENCOUNTER — Telehealth (HOSPITAL_COMMUNITY): Payer: 59 | Admitting: Physician Assistant

## 2023-02-08 ENCOUNTER — Encounter (HOSPITAL_COMMUNITY): Payer: Self-pay

## 2023-02-22 ENCOUNTER — Ambulatory Visit: Payer: Self-pay | Admitting: Pharmacist

## 2023-02-28 ENCOUNTER — Telehealth: Payer: Self-pay

## 2023-02-28 NOTE — Telephone Encounter (Signed)
Detectable Viral Load Intervention (DVL)  Most recent VL:  HIV 1 RNA Quant  Date Value Ref Range Status  12/23/2022 57,700 (H) Copies/mL Final  01/06/2022 44 (H) Copies/mL Final  10/29/2021 88,100 (H) Copies/mL Final    Last Clinic Visit:  12/23/22  Current ART regimen: Biktarvy  Appointment status: patient does not have future appointment scheduled   Medication last dispensed (per chart review):  Bictegravir-Emtricitab-Tenofov   Dispensed Days Supply Quantity Provider Pharmacy  BIKTARVY 50/200/25MG  TABLETS 01/07/2023 30 30 each Blanchard Kelch, NP [facility]...  BIKTARVY 50/200/25MG  TABLETS 11/16/2022 30 30 each Blanchard Kelch, NP [facility] #...  BIKTARVY 50/200/25MG  TABLETS 08/17/2022 30 30 each Blanchard Kelch, NP [facility] #...   Medication Adherence   Not able to assess   Barriers to Care   Not able to assess.    Interventions   Called patient to discuss medication adherence and possible barriers to care.  Call can not be completed at this time. Juanita Laster, RMA

## 2023-03-11 ENCOUNTER — Emergency Department (HOSPITAL_COMMUNITY)
Admission: EM | Admit: 2023-03-11 | Discharge: 2023-03-11 | Discharge status: Against Medical Advice | Payer: 59 | Attending: Emergency Medicine | Admitting: Emergency Medicine

## 2023-03-11 ENCOUNTER — Encounter (HOSPITAL_COMMUNITY): Payer: Self-pay | Admitting: *Deleted

## 2023-03-11 ENCOUNTER — Emergency Department (HOSPITAL_COMMUNITY)
Admission: EM | Admit: 2023-03-11 | Discharge: 2023-03-11 | Disposition: A | Discharge status: Home / Self Care | Payer: 59 | Attending: Student | Admitting: Student

## 2023-03-11 ENCOUNTER — Other Ambulatory Visit: Payer: Self-pay

## 2023-03-11 ENCOUNTER — Emergency Department (HOSPITAL_COMMUNITY): Payer: 59

## 2023-03-11 DIAGNOSIS — Z5321 Procedure and treatment not carried out due to patient leaving prior to being seen by health care provider: Secondary | ICD-10-CM | POA: Diagnosis not present

## 2023-03-11 DIAGNOSIS — R0781 Pleurodynia: Secondary | ICD-10-CM | POA: Diagnosis not present

## 2023-03-11 DIAGNOSIS — R6883 Chills (without fever): Secondary | ICD-10-CM | POA: Insufficient documentation

## 2023-03-11 DIAGNOSIS — Z21 Asymptomatic human immunodeficiency virus [HIV] infection status: Secondary | ICD-10-CM | POA: Insufficient documentation

## 2023-03-11 DIAGNOSIS — L259 Unspecified contact dermatitis, unspecified cause: Secondary | ICD-10-CM

## 2023-03-11 DIAGNOSIS — R21 Rash and other nonspecific skin eruption: Secondary | ICD-10-CM | POA: Insufficient documentation

## 2023-03-11 DIAGNOSIS — F17210 Nicotine dependence, cigarettes, uncomplicated: Secondary | ICD-10-CM | POA: Insufficient documentation

## 2023-03-11 LAB — COMPREHENSIVE METABOLIC PANEL
ALT: 10 U/L (ref 0–44)
AST: 21 U/L (ref 15–41)
Albumin: 3.6 g/dL (ref 3.5–5.0)
Alkaline Phosphatase: 54 U/L (ref 38–126)
Anion gap: 8 (ref 5–15)
BUN: 18 mg/dL (ref 6–20)
CO2: 25 mmol/L (ref 22–32)
Calcium: 8.6 mg/dL — ABNORMAL LOW (ref 8.9–10.3)
Chloride: 104 mmol/L (ref 98–111)
Creatinine, Ser: 1.35 mg/dL — ABNORMAL HIGH (ref 0.61–1.24)
GFR, Estimated: >60 mL/min (ref >60)
Glucose, Bld: 112 mg/dL — ABNORMAL HIGH (ref 70–99)
Potassium: 3.7 mmol/L (ref 3.5–5.1)
Sodium: 137 mmol/L (ref 135–145)
Total Bilirubin: 0.6 mg/dL (ref 0.3–1.2)
Total Protein: 7.3 g/dL (ref 6.5–8.1)

## 2023-03-11 LAB — CBC WITH DIFFERENTIAL/PLATELET
Abs Immature Granulocytes: 0.01 10*3/uL (ref 0.00–0.07)
Basophils Absolute: 0 10*3/uL (ref 0.0–0.1)
Basophils Relative: 1 %
Eosinophils Absolute: 0.2 10*3/uL (ref 0.0–0.5)
Eosinophils Relative: 6 %
HCT: 33.6 % — ABNORMAL LOW (ref 39.0–52.0)
Hemoglobin: 11 g/dL — ABNORMAL LOW (ref 13.0–17.0)
Immature Granulocytes: 0 %
Lymphocytes Relative: 42 %
Lymphs Abs: 1.5 10*3/uL (ref 0.7–4.0)
MCH: 34.2 pg — ABNORMAL HIGH (ref 26.0–34.0)
MCHC: 32.7 g/dL (ref 30.0–36.0)
MCV: 104.3 fL — ABNORMAL HIGH (ref 80.0–100.0)
Monocytes Absolute: 0.2 10*3/uL (ref 0.1–1.0)
Monocytes Relative: 7 %
Neutro Abs: 1.7 10*3/uL (ref 1.7–7.7)
Neutrophils Relative %: 44 %
Platelets: 218 10*3/uL (ref 150–400)
RBC: 3.22 MIL/uL — ABNORMAL LOW (ref 4.22–5.81)
RDW: 12.6 % (ref 11.5–15.5)
WBC: 3.7 10*3/uL — ABNORMAL LOW (ref 4.0–10.5)
nRBC: 0 % (ref 0.0–0.2)

## 2023-03-11 LAB — T-HELPER CELLS (CD4) COUNT (NOT AT ARMC)
CD4 % Helper T Cell: 15 % — ABNORMAL LOW (ref 33–65)
CD4 T Cell Abs: 205 /uL — ABNORMAL LOW (ref 400–1790)

## 2023-03-11 LAB — TROPONIN I (HIGH SENSITIVITY): Troponin I (High Sensitivity): 5 ng/L (ref <18)

## 2023-03-11 MED ORDER — HYDROCORTISONE 1 % EX CREA: TOPICAL_CREAM | CUTANEOUS | 0 refills | Status: DC

## 2023-03-11 NOTE — ED Notes (Signed)
Pt went to registration c/o chest pain. Was brought back to triage. Was informed that we would obtain an EKG and further work up for his concerns for chest pain however he would have to return to the lobby. Pt sts he would just want to leave as his arm continues to burn. No EKG obtained as pt sts he is going to another hospital where he does not have to wait.

## 2023-03-11 NOTE — ED Provider Notes (Signed)
[place] EMERGENCY DEPARTMENT AT [facility] Provider Note  CSN: [id number] Arrival date & time: 03/11/23 4098  Chief Complaint(s) Rash  HPI Lee Bridges is a 32 y.o. male with PMH HIV currently noncompliant with his antiretroviral therapy with decreased CD4 count, bipolar disorder, polysubstance abuse who presents emergency room for evaluation of multiple complaints including right-sided pleuritic chest pain and a rash to the left forearm.  States that symptoms began 3 days ago around the time when he last used cocaine and methamphetamine.  States that he was putting up hollowing decorations near an area with known poison ivy.  Started to develop a rash left forearm that is itchy.  Denies shortness of breath, abdominal pain, nausea, vomiting, headache, fever or other systemic symptoms.  Does state that has been noncompliant with his daily Bactrim prophylaxis.  Went to [employer] this morning but left without being seen.   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 Authorizing Provider  bictegravir-emtricitabine-tenofovir AF (BIKTARVY) 50-200-25 MG TABS tablet Take 1 tablet by mouth daily. 12/23/22   Blanchard Kelch, NP  chlorhexidine (PERIDEX) 0.12 % solution 15 mLs 2 (two) times daily. Patient not taking: Reported on 12/23/2022 07/06/21   [provider]  QUEtiapine (SEROQUEL) 50 MG tablet Take 1 tablet (50 mg total) by mouth at bedtime. 12/28/22   Nwoko, Tommas Olp, PA  sulfamethoxazole-trimethoprim (BACTRIM DS) 800-160 MG tablet Take 1 tablet by mouth daily. 01/03/23   Blanchard Kelch, NP  traZODone (DESYREL) 100 MG tablet Take 1 tablet (100 mg total) by mouth at bedtime as needed for sleep. 01/29/22   Meta Hatchet, PA                                                                                                                                    Past Surgical History History reviewed. No pertinent surgical history. Family History Family History  Problem Relation Age of Onset   Arthritis Mother    Diabetes Father    Hypertension Father     Social History Social History   Tobacco Use   Smoking status: Every Day    Current packs/day: 0.50    Types: Cigarettes   Smokeless tobacco: Never  Substance Use Topics   Alcohol use: Not Currently   Drug use: Not Currently    Types: Cocaine    Comment: socially   Allergies Fish-derived products  Review of Systems Review of Systems  Cardiovascular:  Positive for chest pain.  Skin:  Positive for rash.    Physical Exam Vital Signs  I have reviewed the triage vital signs BP (!) 135/96   Pulse 90   Temp 97.7 F (36.5 C) (Oral)   Resp 17   SpO2 100%   Physical Exam Constitutional:      General: He is not in acute distress.    Appearance: Normal appearance.  HENT:     Head: Normocephalic and atraumatic.     Nose: No congestion or rhinorrhea.  Eyes:     General:        Right eye: No discharge.        Left eye: No discharge.     Extraocular Movements: Extraocular movements intact.     Pupils: Pupils are equal, round, and reactive to light.  Cardiovascular:     Rate and Rhythm: Normal rate and regular rhythm.      Heart sounds: No murmur heard. Pulmonary:     Effort: No respiratory distress.     Breath sounds: No wheezing or rales.  Abdominal:     General: There is no distension.     Tenderness: There is no abdominal tenderness.  Musculoskeletal:        General: Normal range of motion.     Cervical back: Normal range of motion.  Skin:    General: Skin is warm and dry.     Findings: Rash present.  Neurological:     General: No focal deficit present.     Mental Status: He is alert.     ED Results and Treatments Labs (all labs ordered are listed, but only abnormal results are displayed) Labs Reviewed  T-HELPER CELLS (CD4) COUNT (NOT AT [facility])  HIV-1 RNA QUANT-NO REFLEX-BLD  RPR  TROPONIN I (HIGH SENSITIVITY)                                                                                                                          Radiology DG Chest 2 View  Result Date: 03/11/2023 CLINICAL DATA:  Three day history of chills and rash.  Chest pain. EXAM: CHEST - 2 VIEW COMPARISON:  Chest radiograph dated 10/09/2020 FINDINGS: Normal lung volumes. No focal consolidations. No pleural effusion or pneumothorax. The heart size and mediastinal contours are within normal limits. No acute osseous abnormality. IMPRESSION: No active cardiopulmonary disease. Electronically Signed   By: Agustin Cree M.D.   On: 03/11/2023 08:21    Pertinent labs & imaging results that were available during my care of the patient were reviewed by me and considered in my medical decision making (see MDM for details).  Medications Ordered in ED Medications - No data to display  Procedures Procedures  (including critical care time)  Medical Decision Making / ED Course   This patient presents to the ED for concern of rash, chest pain, this involves an extensive number of treatment options, and is a  complaint that carries with it a high risk of complications and morbidity.  The differential diagnosis includes contact dermatitis, abrasion, monkeypox, poison ivy exposure, rib fracture, pleurisy, PJP pneumonia, PE  MDM: Patient seen emergency room for evaluation of multiple complaints as described above.  Physical exam with a linear vesicular rash over the dorsal aspect of the left forearm consistent with contact dermatitis from poison ivy exposure.  Will cover with hydrocortisone.  Laboratory evaluation from this morning with a creatinine of 1.35, leukopenia to 3.7, hemoglobin 11.0 with an MCV of 104.3.  Chest x-ray unremarkable.  No evidence of PJP pneumonia.  Low risk by Wells criteria and have low suspicion for PE.  High-sensitivity troponin is negative.  ECG with lateral T wave inversions but no comparison available.  Heart score less than 4 and I have low suspicion for ACS.  I spoke with the patient's primary infectious disease nurse practitioner over epic chat to inform her that he will be coming over the office to get his medications refilled including his antiretrovirals and his Bactrim.  At this time he does not meet inpatient criteria for admission and he is safe for discharge with outpatient follow-up.   Additional history obtained: -Additional history obtained from boyfriend -External records from outside source obtained and reviewed including: Chart review including previous notes, labs, imaging, consultation notes   Lab Tests: -I ordered, reviewed, and interpreted labs.   The pertinent results include:   Labs Reviewed  T-HELPER CELLS (CD4) COUNT (NOT AT [facility])  HIV-1 RNA QUANT-NO REFLEX-BLD  RPR  TROPONIN I (HIGH SENSITIVITY)      EKG   EKG Interpretation Date/Time:  Friday March 11 2023 06:52:29 EDT Ventricular Rate:  80 PR Interval:  148 QRS Duration:  90 QT Interval:  352 QTC Calculation: 405 R Axis:   80  Text Interpretation: Normal sinus rhythm lateral T wave  inversions, no ecg for comparison Abnormal ECG No previous ECGs available Confirmed by Makynna Manocchio ([id number]) on 03/11/2023 9:04:25 AM         Imaging Studies ordered: I ordered imaging studies including chest x-ray I independently visualized and interpreted imaging. I agree with the radiologist interpretation   Medicines ordered and prescription drug management: No orders of the defined types were placed in this encounter.   -I have reviewed the patients home medicines and have made adjustments as needed  Critical interventions none  Consultations Obtained: I requested consultation with the infectious disease nurse practitioner Durwin Nora,  and discussed lab and imaging findings as well as pertinent plan - they recommend: To the office today   Cardiac Monitoring: The patient was maintained on a cardiac monitor.  I personally viewed and interpreted the cardiac monitored which showed an underlying rhythm of: NSR  Social Determinants of Health:  Factors impacting patients care include: Meth and crack cocaine use   Reevaluation: After the interventions noted above, I reevaluated the patient and found that they have :improved  Co morbidities that complicate the patient evaluation  Past Medical History:  Diagnosis Date   HIV (human immunodeficiency virus infection) (HCC)       Dispostion: I considered admission for this patient, but at this time he does not meet inpatient criteria for admission he is safe for discharge with outpatient follow-up  Final Clinical Impression(s) / ED Diagnoses Final diagnoses:  None     @PCDICTATION @    Glendora Score, MD 03/11/23 1209

## 2023-03-11 NOTE — ED Triage Notes (Signed)
Pt c/o rash on his body for about 3 days. Painful in his chest when he takes a deep breath. (Labs previously done at [facility]).

## 2023-03-11 NOTE — ED Triage Notes (Signed)
Pt sts concern for painful blister like rash with white discharge along with chills x 3 days. No fevers. Pt immunocompromised, HIV.

## 2023-03-12 LAB — HIV-1 RNA QUANT-NO REFLEX-BLD
HIV 1 RNA Quant: 10700 {copies}/mL
LOG10 HIV-1 RNA: 4.029 {Log}

## 2023-03-12 LAB — RPR
RPR Ser Ql: REACTIVE — AB
RPR Titer: 1:32 {titer}

## 2023-03-16 LAB — T.PALLIDUM AB, TOTAL: T Pallidum Abs: REACTIVE — AB

## 2023-03-20 ENCOUNTER — Other Ambulatory Visit: Payer: Self-pay

## 2023-03-20 ENCOUNTER — Encounter (HOSPITAL_COMMUNITY): Payer: Self-pay

## 2023-03-20 ENCOUNTER — Emergency Department (HOSPITAL_COMMUNITY)
Admission: EM | Admit: 2023-03-20 | Discharge: 2023-03-20 | Disposition: A | Discharge status: Home / Self Care | Payer: 59 | Attending: Emergency Medicine | Admitting: Emergency Medicine

## 2023-03-20 DIAGNOSIS — B20 Human immunodeficiency virus [HIV] disease: Secondary | ICD-10-CM | POA: Insufficient documentation

## 2023-03-20 DIAGNOSIS — N4830 Priapism, unspecified: Secondary | ICD-10-CM | POA: Diagnosis not present

## 2023-03-20 DIAGNOSIS — T43215A Adverse effect of selective serotonin and norepinephrine reuptake inhibitors, initial encounter: Secondary | ICD-10-CM

## 2023-03-20 DIAGNOSIS — N4889 Other specified disorders of penis: Secondary | ICD-10-CM | POA: Diagnosis present

## 2023-03-20 MED ORDER — PHENYLEPHRINE 200 MCG/ML FOR PRIAPISM / HYPOTENSION
50.0000 ug | Freq: Once | INTRAMUSCULAR | Status: AC
  Administered 2023-03-20: 500 ug via INTRACAVERNOUS
  Filled 2023-03-20: qty 50

## 2023-03-20 MED ORDER — LIDOCAINE HCL (PF) 1 % IJ SOLN
5.0000 mL | Freq: Once | INTRAMUSCULAR | Status: AC
  Administered 2023-03-20: 5 mL
  Filled 2023-03-20: qty 5

## 2023-03-20 NOTE — ED Triage Notes (Signed)
Reports he took trazadone which causes him to have an erection and reports and erection since 10pm last night.

## 2023-03-20 NOTE — Discharge Instructions (Addendum)
You were seen in the ER for painful, erect penis -condition called priapism. Fortunately, your priapism has resolved. Recommend that he follow-up with urology to ensure that there are no complications. Return to the ER if you have return of the symptoms.

## 2023-03-20 NOTE — ED Provider Notes (Signed)
[place] EMERGENCY DEPARTMENT AT [facility] Ed Provider Note   CSN: [id number] Arrival date & time: 03/20/23  1704     History  Chief Complaint  Patient presents with   Erectile Dysfunction    Lee Bridges is a 32 y.o. male.  HPI    32 year old male comes in with chief complaint of erect, painful penis.  Patient has history of HIV, reported history of cocaine use.  Patient states that he has been prescribed Seroquel for his depression, but sometimes he will take trazodone.  Last night he took 100 mg of trazodone as he had run out of his Seroquel.  About 30 minutes after taking it, he started having erection.  There was no pain last night.  Around 11 AM, the erection was persistent and he started having some discomfort that has worsened over time.  Currently the pain is 10 out of 10.  Patient has had previous erection with trazodone, but he used to take smaller doses of trazodone and the erection would resolve.  The pain was not as severe.  He has not required ER visit or intervention in the past.  Home Medications Prior to Admission medications   Medication Sig Start Date End Date Taking? Authorizing Provider  bictegravir-emtricitabine-tenofovir AF (BIKTARVY) 50-200-25 MG TABS tablet Take 1 tablet by mouth daily. 12/23/22   Blanchard Kelch, NP  chlorhexidine (PERIDEX) 0.12 % solution 15 mLs 2 (two) times daily. Patient not taking: Reported on 12/23/2022 07/06/21   [provider]  hydrocortisone cream 1 % Apply to affected area 2 times daily 03/11/23   Kommor, [place], MD  QUEtiapine (SEROQUEL) 50 MG tablet Take 1 tablet (50 mg total) by mouth at bedtime. 12/28/22   Nwoko, Tommas Olp, PA  sulfamethoxazole-trimethoprim (BACTRIM DS) 800-160 MG tablet Take 1 tablet by mouth daily. 01/03/23   Blanchard Kelch, NP  traZODone (DESYREL) 100 MG tablet Take 1 tablet (100 mg total) by mouth at bedtime as needed for sleep. 01/29/22   Nwoko, Tommas Olp, PA      Allergies     Fish-derived products    Review of Systems   Review of Systems  All other systems reviewed and are negative.   Physical Exam Updated Vital Signs BP 133/70   Pulse 82   Temp 98.5 F (36.9 C) (Oral)   Resp 18   Ht 5\' 2"  (1.575 m)   Wt 54.4 kg   SpO2 100%   BMI 21.95 kg/m  Physical Exam Vitals and nursing note reviewed. Exam conducted with a chaperone present.  Constitutional:      Appearance: He is well-developed.  HENT:     Head: Atraumatic.  Eyes:     Extraocular Movements: Extraocular movements intact.     Pupils: Pupils are equal, round, and reactive to light.  Cardiovascular:     Rate and Rhythm: Normal rate.  Pulmonary:     Effort: Pulmonary effort is normal.  Genitourinary:    Penis: Swelling present.      Comments: Erect, engorged penis Musculoskeletal:     Cervical back: Neck supple.  Skin:    General: Skin is warm.  Neurological:     Mental Status: He is alert and oriented to person, place, and time.     ED Results / Procedures / Treatments   Labs (all labs ordered are listed, but only abnormal results are displayed) Labs Reviewed  I-STAT VENOUS BLOOD GAS, ED    EKG None  Radiology No results found.  Procedures  Irrigate corpus cavern, priapism  Date/Time: 03/20/2023 6:49 PM  Performed by: Derwood Kaplan, MD Authorized by: Derwood Kaplan, MD  Consent: Written consent obtained. Risks and benefits: risks, benefits and alternatives were discussed Consent given by: patient Patient understanding: patient states understanding of the procedure being performed Patient consent: the patient's understanding of the procedure matches consent given Procedure consent: procedure consent matches procedure scheduled Relevant documents: relevant documents present and verified Test results: test results available and properly labeled Site marked: the operative site was marked Imaging studies: imaging studies available Patient identity confirmed: arm  band Time out: Immediately prior to procedure a "time out" was called to verify the correct patient, procedure, equipment, support staff and site/side marked as required. Preparation: Patient was prepped and draped in the usual sterile fashion. Local anesthesia used: yes  Anesthesia: Local anesthesia used: yes Local Anesthetic: lidocaine 1% with epinephrine Anesthetic total: 2 mL  Sedation: Patient sedated: no  Comments: 23-gauge butterfly needle inserted at 10:00 location of the penis proximally. About 10 cc of dark blood was aspirated immediately. Thereafter, total of 300 mcg of phenylephrine was injected (100 mcg every 5 minutes). Additional 15 cc of blood was aspirated thereafter.   .Critical Care  Performed by: Derwood Kaplan, MD Authorized by: Derwood Kaplan, MD   Critical care provider statement:    Critical care time (minutes):  35   Critical care was time spent personally by me on the following activities:  Development of treatment plan with patient or surrogate, discussions with consultants, evaluation of patient's response to treatment, examination of patient, ordering and review of laboratory studies, ordering and review of radiographic studies, ordering and performing treatments and interventions, pulse oximetry, re-evaluation of patient's condition and review of old charts     Medications Ordered in ED Medications  phenylephrine 200 mcg / ml CONC. DILUTION INJ (ED / Urology USE ONLY) (500 mcg Intracavernosal Given 03/20/23 1800)  lidocaine (PF) (XYLOCAINE) 1 % injection 5 mL (5 mLs Infiltration Given 03/20/23 1823)    ED Course/ Medical Decision Making/ A&P                                 Medical Decision Making Risk Prescription drug management.  32 year old male comes in with chief complaint of erratic, painful penis. Pain has been present now for more than 6 hours prior to my assessment.  Patient indicates that he took trazodone yesterday that provoked  the priapism.  He has had similar issues in the past, but the priapism resolved on its own after few hours.  Differential diagnoses effectively is priapism because of trazodone adverse effect versus priapism from substance use disorder.  Patient does not have sickle cell anemia.  He denies taking any nitrates.  Plan is to proceed with treatment with phenylephrine and aspirating the penis.  7:59 PM We aspirated about 25 cc of dark blood and gave patient 300 mcg of phenylephrine over the course of 15 minutes. On reassessment at 7:45 PM, patient states that his pain has pretty much resolved.  Erection no longer present.  Exam reveals nondirect penis now.  Stable for discharge.  Urology follow-up recommended in 7 to 10 days.  Final Clinical Impression(s) / ED Diagnoses Final diagnoses:  Priapism  Adverse effect of trazodone    Rx / DC Orders ED Discharge Orders     None         Derwood Kaplan, MD 03/20/23 2000

## 2023-03-20 NOTE — ED Notes (Signed)
Priapism resolved, pt reports relief

## 2023-03-26 ENCOUNTER — Ambulatory Visit (HOSPITAL_COMMUNITY)
Admission: EM | Admit: 2023-03-26 | Discharge: 2023-03-26 | Disposition: A | Discharge status: Home / Self Care | Payer: 59

## 2023-03-26 DIAGNOSIS — Z760 Encounter for issue of repeat prescription: Secondary | ICD-10-CM | POA: Diagnosis not present

## 2023-03-26 DIAGNOSIS — F3131 Bipolar disorder, current episode depressed, mild: Secondary | ICD-10-CM | POA: Diagnosis not present

## 2023-03-26 NOTE — Discharge Instructions (Addendum)
[facility]  [facility] [address] [place], [place], [address] [phone] phone  Offers food and emergency or transitional housing to men, women, or families in need. Clients participate in programs and workshops developed to promote self-sufficiency and personal development.Call or walk in. Applications are accepted Monday, Wednesday, and Friday by appointment only. Need photo ID and proof of income.  [facility] [address], [place], [place] [address] [phone] Population served: Adult men & women (22 years old and older, able to perform activities for daily living) Documents required: Valid ID & Social Security Card   [facility] Marita Snellen [address], [place], [place]  [address] [phone] Population served: Male veterans 18+ with substance abuse/mental health issues Eligibility: By referral only  [facility] [address] [place], [place] [address] [phone] Population served: Males 18+ Documents required: Valid ID & Social Security Card  Room at [employer] [facility] [employer] [address], [place] [address] [phone] or [phone] Population served: Pregnant women with or without children  Documents required: Valid ID & Social Security Card    [facility] [address], [place], [place] [address] [phone] Population served: Men 18+, preference for disabled and/or veterans Eligibility: By referral only      If you are in need of housing through the shelter, contact the [employer] at [phone].

## 2023-03-26 NOTE — ED Provider Notes (Signed)
Behavioral Health Urgent Care Medical Screening Exam  Patient Name: Lee Bridges MRN: [id number] Date of Evaluation: 03/26/23 Chief Complaint: requesting medication refill for Seroquel  Diagnosis:  Final diagnoses:  Bipolar affective disorder, currently depressed, mild (HCC)  Medication refill    History of Present illness: Lee Bridges is a 32 y.o. male patient presented to [facility] as a walk in unaccompanied requesting a refill of Seroquel 50 mg at bedtime.   Lee Bridges, 32 y.o., male patient seen face to face by this provider, chart reviewed, and consulted with Dr. Dairl Ponder on 03/26/23.  Per chart review patient has a past psychiatric history of Bipolar Affective Disorder, GAD, and MDD. He has services in place with [facility] Otila Back PA his last visit was 12/28/2022 . Reports he has no appointments scheduled at this time. He has been out of Seroquel for roughly 3-4 weeks. He is currently homeless.  He is diagnosed with HIV and has services in place with infectious disease.  In addition he sees the therapist at infectious disease weekly.  Patient presents today requesting a refill of Seroquel 50 mg nightly.  Patient reports he has been able to sleep throughout the night since he has been off of the Seroquel.  He has noticed that his mood has fluctuated.  He has noticed an increase in his depression with feelings of fatigue, decreased motivation, and decreased focus.  He has a euthymic affect and smiles often throughout the assessment.  He also disclosed that he is currently homeless.  He has obtained a new job and will get paid in 1 week and at that time states he can get a hotel room.  He is requesting to be admitted for the next week.  Explained the patient that he does not meet any criteria to be admitted to the psychiatric hospital.  Discussed shelter resources including the interactive resource center.  Also provided outpatient psychiatric resources for medication management and therapy.   Encouraged patient to present next week for open access appointment with [facility] to reestablish services.  Prescription for Seroquel 50 mg nightly for 30-day supply sent to patient's pharmacy of choice.  Educated patient on adverse reactions and how to take medication.  Patient verbalized understanding.  During evaluation Lee Bridges is observed sitting in the assessment area in no acute distress.  He is well dressed and well-groomed.Marland Kitchen  He is alert/oriented x 4, calm, cooperative, attentive, and responses were relevant and appropriate to assessment questions.  He he spoke in a clear tone at moderate volume, and normal pace, with good eye contact.   He denies suicidal/self-harm/homicidal ideation, psychosis, and paranoia.  Objectively there is no evidence of psychosis/mania or delusional thinking.  He conversed coherently, with goal directed thoughts, and no distractibility, or pre-occupation and he has denied suicidal/self-harm/homicidal ideation, psychosis, and paranoia.   At this time Lee Bridges is educated and verbalizes understanding of mental health resources and other crisis services in the community. He is instructed to call 911 and present to the nearest emergency room should. He experience any suicidal/homicidal ideation, auditory/visual/hallucinations, or detrimental worsening of his mental health condition.  He was a also advised by Clinical research associate that he could call the toll-free phone on back of  insurance card to assist with identifying counselors and agencies in network.    Flowsheet Row ED from 03/26/2023 in [facility] ED from 03/20/2023 in [facility] Emergency Department at [facility] ED from 03/11/2023 in [facility] Emergency Department  at [facility]  C-SSRS RISK CATEGORY No Risk No Risk No Risk       Psychiatric Specialty Exam  Presentation  General Appearance:Appropriate for Environment; Casual; Well Groomed  Eye Contact:Good  Speech:Clear  and Coherent; Normal Rate  Speech Volume:Normal  Handedness:Right   Mood and Affect  Mood: Depressed  Affect: Congruent   Thought Process  Thought Processes: Coherent  Descriptions of Associations:Intact  Orientation:Full (Time, Place and Person)  Thought Content:Logical  Diagnosis of Schizophrenia or Schizoaffective disorder in past: No data recorded  Hallucinations:None  Ideas of Reference:None  Suicidal Thoughts:No  Homicidal Thoughts:No   Sensorium  Memory: Immediate Good; Recent Good; Remote Good  Judgment: Good  Insight: Good   Executive Functions  Concentration: Good  Attention Span: Good  Recall: Good  Fund of Knowledge: Good  Language: Good   Psychomotor Activity  Psychomotor Activity: Normal   Assets  Assets: Communication Skills; Desire for Improvement; Financial Resources/Insurance; Physical Health; Resilience; Social Support   Sleep  Sleep: Fair  Number of hours:  6   Physical Exam: Physical Exam Vitals and nursing note reviewed.  Constitutional:      Appearance: Normal appearance.  HENT:     Right Ear: External ear normal.     Left Ear: External ear normal.  Eyes:     General:        Right eye: No discharge.        Left eye: No discharge.  Cardiovascular:     Rate and Rhythm: Normal rate.  Musculoskeletal:        General: Normal range of motion.     Cervical back: Normal range of motion.  Neurological:     Mental Status: He is alert and oriented to person, place, and time.  Psychiatric:        Attention and Perception: Attention and perception normal.        Mood and Affect: Mood is depressed.        Speech: Speech normal.        Behavior: Behavior normal. Behavior is cooperative.        Thought Content: Thought content normal.        Cognition and Memory: Cognition normal.        Judgment: Judgment normal.    Review of Systems  Constitutional: Negative.   HENT: Negative.    Eyes: Negative.    Respiratory: Negative.    Cardiovascular: Negative.   Musculoskeletal: Negative.   Skin: Negative.   Neurological: Negative.   Psychiatric/Behavioral:  Positive for depression.    Blood pressure (!) 149/98, pulse 81, temperature 98.4 F (36.9 C), temperature source Oral, resp. rate 18, SpO2 100%. There is no height or weight on file to calculate BMI.  Musculoskeletal: Strength & Muscle Tone: within normal limits Gait & Station: normal Patient leans: N/A   [facility] MSE Discharge Disposition for Follow up and Recommendations: Based on my evaluation the patient does not appear to have an emergency medical condition and can be discharged with resources and follow up care in outpatient services for Medication Management and Individual Therapy  Discharge patient  Provided outpatient psychiatric resources for medication management and therapy.  Encouraged patient to contact [facility] and reestablish services with Otila Back or present to open access hours next week.  Provided 30-day prescription for Seroquel 50 mg nightly.   Ardis Hughs, NP 03/26/2023, 9:55 AM

## 2023-03-26 NOTE — Progress Notes (Signed)
   03/26/23 0901  [facility] Triage Screening (Walk-ins at [facility] only)  How Did You Hear About [place]? Self  What Is the Reason for Your Visit/Call Today? Lee Bridges is a 32 year old male who presents to [facility] voluntarily seeking a medication refill for his Seroquel. Pt states he had a refill but the pharmacy did not mail the prescription. He states he received all of his other medications except for this one. Patient states he has been unable to sleep. Patient is receiving therapy 1x a week at the infectious disease clinic. Patient reports he is seen by  psychiatry at [facility]. Patient denies SI/HI and AVH.  How Long Has This Been Causing You Problems? <Week  Have You Recently Had Any Thoughts About Hurting Yourself? No  Are You Planning to Commit Suicide/Harm Yourself At This time? No  Have you Recently Had Thoughts About Hurting Someone Lee Bridges? No  Are You Planning To Harm Someone At This Time? No  Are you currently experiencing any auditory, visual or other hallucinations? No  Have You Used Any Alcohol or Drugs in the Past 24 Hours? No  Do you have any current medical co-morbidities that require immediate attention? No  Clinician description of patient physical appearance/behavior: alert, cooperative  What Do You Feel Would Help You the Most Today? Medication(s)  If access to [facility] was not available, would you have sought care in the Emergency Department? No  Determination of Need Routine (7 days)  Options For Referral Medication Management

## 2023-03-29 ENCOUNTER — Ambulatory Visit (INDEPENDENT_AMBULATORY_CARE_PROVIDER_SITE_OTHER): Payer: 59 | Admitting: Physician Assistant

## 2023-03-29 ENCOUNTER — Encounter (HOSPITAL_COMMUNITY): Payer: Self-pay | Admitting: Physician Assistant

## 2023-03-29 ENCOUNTER — Telehealth (HOSPITAL_COMMUNITY): Payer: Self-pay

## 2023-03-29 DIAGNOSIS — F3132 Bipolar disorder, current episode depressed, moderate: Secondary | ICD-10-CM

## 2023-03-29 MED ORDER — QUETIAPINE FUMARATE 50 MG PO TABS: 50.0000 mg | ORAL_TABLET | Freq: Every day | ORAL | 1 refills | Status: DC

## 2023-03-29 NOTE — Progress Notes (Signed)
BH MD/PA/NP OP Progress Note  03/29/2023 9:39 AM Lee Bridges  MRN:  [id number]  Chief Complaint:  Chief Complaint  Patient presents with   Follow-up   Medication Refill   HPI:   Lee Bridges "[name]" is a 32 year old, African-American male with a past psychiatric history significant for bipolar disorder, generalized anxiety disorder, and insomnia who presents to [facility] for follow-up and medication management.  Patient was last seen by this provider on 12/28/2022.  During his last encounter, patient was being managed on the following psychiatric medications: Seroquel (quetiapine) 50 mg at bedtime.  Patient presents to the encounter stating that Seroquel has been working well.  He reports that the medication balances him out the day.  He also reports that the medication allows him to sleep at night.  He states that he tried taking Seroquel 100 mg at bedtime but states that the medication made him pass out and overeat.  He reports that Seroquel at 50 mg is working perfectly.  Patient endorses stable mood and states that he is currently working at a [employer].  Patient denies depressive symptoms nor does he endorse anxiety.  Patient further denies any new stressors at this time.  He does report having issues picking up his prescription at a specific pharmacy due to needing a prior authorization.  Provider to authorize patient's use of Seroquel.  A PHQ 9 screen was performed with the patient scoring a 7.  A GAD-7 screen was also performed with the patient scoring a 5.  Patient is alert and oriented x 4, calm, cooperative, and fully engaged in conversation during the encounter.  Patient endorses calm and stable mood.  He further denies any anxiety at this time.  He denies suicidal or homicidal ideations.  He further denies auditory or visual hallucinations and does not appear to be responding to internal/external stimuli.  Patient endorses good sleep and  receives on average 8 to 9 hours of sleep per night.  Patient endorses good appetite and eats on average 3 meals per day.  Patient denies alcohol consumption.  He endorses tobacco use in the form of 2 Black of Milds per day.  Patient denies illicit drug use.  Visit Diagnosis:    ICD-10-CM   1. Bipolar affective disorder, currently depressed, moderate (HCC)  F31.32 QUEtiapine (SEROQUEL) 50 MG tablet       Past Psychiatric History:  Unspecified mood disorder Major depressive disorder Generalized anxiety disorder  Past Medical History:  Past Medical History:  Diagnosis Date   HIV (human immunodeficiency virus infection) (HCC)    History reviewed. No pertinent surgical history.  Family Psychiatric History:  Patient reports that psychiatric illnesses run on his mother side of the family.   Cousin - Bipolar disorder/mood swings Cousin - Schizophrenia  Family History:  Family History  Problem Relation Age of Onset   Arthritis Mother    Diabetes Father    Hypertension Father     Social History:  Social History   Socioeconomic History   Marital status: Single    Spouse name: Not on file   Number of children: Not on file   Years of education: Not on file   Highest education level: Not on file  Occupational History   Not on file  Tobacco Use   Smoking status: Every Day    Current packs/day: 0.50    Types: Cigarettes   Smokeless tobacco: Never  Substance and Sexual Activity   Alcohol use: Not Currently  Drug use: Not Currently    Types: Cocaine    Comment: socially   Sexual activity: Not Currently    Comment: DECL[place]ED [place]DOM[place]  Other Topics Concern   Not on file  Social History Narrative   Lives with mother.    Social Determinants of Health   Financial Resource Strain: Not on file  Food Insecurity: Not on file  Transportation Needs: Not on file  Physical Activity: Not on file  Stress: Not on file  Social Connections: Not on file    Allergies:  Allergies   Allergen Reactions   Fish-Derived Products Hives and Nausea And Vomiting    Metabolic Disorder Labs: No results found for: "HGBA1C", "MPG" No results found for: "PROLACTIN" No results found for: "CHOL", "TRIG", "HDL", "CHOLHDL", "VLDL", "LDLCALC" No results found for: "TSH"  Therapeutic Level Labs: No results found for: "LITHIUM" No results found for: "VALPROATE" No results found for: "CBMZ"  Current Medications: Current Outpatient Medications  Medication Sig Dispense Refill   bictegravir-emtricitabine-tenofovir AF (BIKTARVY) 50-200-25 MG TABS tablet Take 1 tablet by mouth daily. 30 tablet 11   chlorhexidine (PERIDEX) 0.12 % solution 15 mLs 2 (two) times daily. (Patient not taking: Reported on 12/23/2022)     hydrocortisone cream 1 % Apply to affected area 2 times daily 15 g 0   QUEtiapine (SEROQUEL) 50 MG tablet Take 1 tablet (50 mg total) by mouth at bedtime. 30 tablet 1   sulfamethoxazole-trimethoprim (BACTRIM DS) 800-160 MG tablet Take 1 tablet by mouth daily. 30 tablet 3   traZODone (DESYREL) 100 MG tablet Take 1 tablet (100 mg total) by mouth at bedtime as needed for sleep. 30 tablet 1   No current facility-administered medications for this visit.     Musculoskeletal: Strength & Muscle Tone: within normal limits Gait & Station: normal Patient leans: N/A  Psychiatric Specialty Exam: Review of Systems  Psychiatric/Behavioral:  Positive for sleep disturbance. Negative for decreased concentration, dysphoric mood, hallucinations, self-injury and suicidal ideas. The patient is nervous/anxious and is hyperactive.     Blood pressure 125/88, pulse 73, height 5\' 2"  (1.575 m), weight 123 lb 3.2 oz (55.9 kg), SpO2 100%.Body mass index is 22.53 kg/m.  General Appearance: Casual  Eye Contact:  Good  Speech:  Clear and Coherent and Normal Rate  Volume:  Normal  Mood:  Anxious and Depressed  Affect:  Congruent  Thought Process:  Coherent, Goal Directed, and Descriptions of  Associations: Intact  Orientation:  Full (Time, Place, and Person)  Thought Content: WDL   Suicidal Thoughts:  No  Homicidal Thoughts:  No  Memory:  Immediate;   Good Recent;   Good Remote;   Good  Judgement:  Good  Insight:  Good  Psychomotor Activity:  Normal  Concentration:  Concentration: Good and Attention Span: Good  Recall:  Good  Fund of Knowledge: Good  Language: Good  Akathisia:  No  Handed:  Right  AIMS (if indicated): not done  Assets:  Communication Skills Desire for Improvement Housing Social Support  ADL's:  Intact  Cognition: WNL  Sleep:  Poor   Screenings: GAD-7    Flowsheet Row Clinical Support from 03/29/2023 in [facility] Video Visit from 12/28/2022 in [facility] Video Visit from 11/19/2022 in [facility] Video Visit from 01/29/2022 in [facility] Video Visit from 10/30/2021 in [facility]  Total GAD-7 Score 5 19 19 18 14       [phone]  Flowsheet Row Clinical Support from 03/29/2023 in [facility] Video Visit from 12/28/2022 in [facility] Office Visit from 12/23/2022 in [facility] for Infectious Disease Video Visit from 11/19/2022 in [facility] Video Visit from 01/29/2022 in [facility]  PHQ-2 Total Score 2 5 0 4 5  PHQ-9 Total Score 7 24 -- 23 23      Flowsheet Row Clinical Support from 03/29/2023 in [facility] ED from 03/26/2023 in [facility] ED from 03/20/2023 in [facility] Emergency Department at [facility]  C-SSRS RISK CATEGORY Moderate Risk No Risk No Risk        Assessment and Plan:   Lee Bridges "[name]" is a 32 year old, African-American male with a past psychiatric history significant for bipolar  disorder, generalized anxiety disorder, and insomnia who presents to [facility] via virtual video visit for follow-up and medication management.  Patient presents to the encounter stating that his use of Seroquel has been effective in managing his mood.  He reports that the medication balances him throughout the day and allows him to sleep.  He reports that he tried taking 100 mg of Seroquel to see if the medication would be more effective in managing his mood but states that the medication made him overheated and passed out.  Patient denies depressive symptoms at this time nor does he endorse anxiety.  Patient would like to continue taking Seroquel 50 mg at bedtime for mood stability.  Patient's medication to be e-prescribed to pharmacy of choice.  Collaboration of Care: Collaboration of Care: Medication Management AEB provider managing patient's psychiatric medications, Psychiatrist AEB patient being followed by mental health provider at this facility, and Other provider involved in patient's care AEB patient being followed by infectious diseases  Patient/Guardian was advised Release of Information must be obtained prior to any record release in order to collaborate their care with an outside provider. Patient/Guardian was advised if they have not already done so to contact the registration department to sign all necessary forms in order for [place] to release information regarding their care.   Consent: Patient/Guardian gives verbal consent for treatment and assignment of benefits for services provided during this visit. Patient/Guardian expressed understanding and agreed to proceed.   1. Bipolar affective disorder, currently depressed, moderate (HCC)  - QUEtiapine (SEROQUEL) 50 MG tablet; Take 1 tablet (50 mg total) by mouth at bedtime.  Dispense: 30 tablet; Refill: 1  Patient to follow-up in 6 weeks Provider spent a total of 16 minutes with the patient/reviewing  patient's chart  Meta Hatchet, PA 03/29/2023, 9:39 AM

## 2023-03-29 NOTE — Telephone Encounter (Signed)
Pa was approved for Quetiapine 50 mg tablets

## 2023-03-30 NOTE — Telephone Encounter (Signed)
Message acknowledged and reviewed. Thank you.

## 2023-04-05 ENCOUNTER — Telehealth: Payer: Self-pay

## 2023-04-05 NOTE — Telephone Encounter (Signed)
Received call from [place] with the state HD to find out if Carolyn was treated for RPR from 10/11. She says she spoke with him and he would prefer to be treated at [facility] even though testing was done at urgent care. Routing to provider for treatment orders.   Sandie Ano, RN

## 2023-04-11 ENCOUNTER — Ambulatory Visit: Payer: 59

## 2023-04-12 NOTE — Telephone Encounter (Signed)
Missed nurse visit this week. Attempted to contact patient to reschedule. Routing to triage to follow up. Valarie Cones

## 2023-05-10 ENCOUNTER — Ambulatory Visit: Payer: Self-pay | Admitting: Infectious Diseases

## 2023-05-10 ENCOUNTER — Encounter (HOSPITAL_COMMUNITY): Payer: MEDICAID | Admitting: Physician Assistant

## 2023-06-07 ENCOUNTER — Emergency Department (HOSPITAL_COMMUNITY)
Admission: EM | Admit: 2023-06-07 | Discharge: 2023-06-07 | Disposition: A | Discharge status: Home / Self Care | Payer: 59 | Attending: Student | Admitting: Student

## 2023-06-07 ENCOUNTER — Other Ambulatory Visit: Payer: Self-pay

## 2023-06-07 ENCOUNTER — Other Ambulatory Visit (HOSPITAL_COMMUNITY): Payer: Self-pay

## 2023-06-07 ENCOUNTER — Telehealth: Payer: 59

## 2023-06-07 DIAGNOSIS — Z21 Asymptomatic human immunodeficiency virus [HIV] infection status: Secondary | ICD-10-CM | POA: Diagnosis not present

## 2023-06-07 DIAGNOSIS — M79644 Pain in right finger(s): Secondary | ICD-10-CM | POA: Diagnosis present

## 2023-06-07 DIAGNOSIS — L03011 Cellulitis of right finger: Secondary | ICD-10-CM | POA: Insufficient documentation

## 2023-06-07 DIAGNOSIS — F17210 Nicotine dependence, cigarettes, uncomplicated: Secondary | ICD-10-CM | POA: Insufficient documentation

## 2023-06-07 MED ORDER — MUPIROCIN CALCIUM 2 % EX CREA
TOPICAL_CREAM | Freq: Once | CUTANEOUS | Status: AC
  Filled 2023-06-07: qty 15

## 2023-06-07 MED ORDER — LIDOCAINE-EPINEPHRINE (PF) 2 %-1:200000 IJ SOLN
10.0000 mL | Freq: Once | INTRAMUSCULAR | Status: AC
  Administered 2023-06-07: 10 mL via INTRADERMAL
  Filled 2023-06-07: qty 20

## 2023-06-07 MED ORDER — CEFADROXIL 500 MG PO CAPS: 500.0000 mg | ORAL_CAPSULE | Freq: Two times a day (BID) | ORAL | 0 refills | Status: AC

## 2023-06-07 MED ORDER — CEFADROXIL 500 MG PO CAPS
500.0000 mg | ORAL_CAPSULE | Freq: Two times a day (BID) | ORAL | Status: DC
  Administered 2023-06-07: 500 mg via ORAL
  Filled 2023-06-07 (×3): qty 1

## 2023-06-07 NOTE — ED Provider Notes (Signed)
 [place] EMERGENCY DEPARTMENT AT [facility] Provider Note  CSN: [id number] Arrival date & time: 06/07/23 9153  Chief Complaint(s) paronychia  HPI Lee Bridges is a 33 y.o. male with PMH HIV, cocaine abuse, major depression who presents emerged apartment for evaluation of finger pain and swelling.  Patient states that over the last 3 days he has had progressive pain and swelling at the distal tip of the right thumb.  Concerned he may have been bitten by a spider.  Denies trauma to the hand.  Denies chest pain, shortness of breath, abdominal pain, nausea, vomiting, fever or other systemic symptoms.   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 Authorizing Provider  bictegravir-emtricitabine -tenofovir  AF (BIKTARVY ) 50-200-25 MG TABS tablet Take 1 tablet by mouth daily. Patient taking differently: Take 1 tablet by mouth every evening. 12/23/22  Yes Melvenia [name] SAILOR, NP                                                                                                                                    Past Surgical History No past surgical history on file. Family History Family History  Problem Relation Age of Onset    Arthritis Mother    Diabetes Father    Hypertension Father     Social History Social History   Tobacco Use   Smoking status: Every Day    Current packs/day: 0.50    Types: Cigarettes   Smokeless tobacco: Never  Substance Use Topics   Alcohol use: Not Currently   Drug use: Not Currently    Types: Cocaine    Comment: socially   Allergies Fish allergy and Shellfish allergy  Review of Systems Review of Systems  Skin:  Positive for wound.    Physical Exam Vital Signs  I have reviewed the triage vital signs BP 131/89 (BP Location: Left Arm)   Pulse (!) 104   Temp 98.1 F (36.7 C)   Resp 17   Ht 5' 2 (1.575 m)   Wt 59.9 kg   SpO2 100%   BMI 24.14 kg/m   Physical Exam Constitutional:      General: He is not in acute distress.    Appearance: Normal appearance.  HENT:  Head: Normocephalic and atraumatic.     Nose: No congestion or rhinorrhea.  Eyes:     General:        Right eye: No discharge.        Left eye: No discharge.     Extraocular Movements: Extraocular movements intact.     Pupils: Pupils are equal, round, and reactive to light.  Cardiovascular:     Rate and Rhythm: Normal rate and regular rhythm.     Heart sounds: No murmur heard. Pulmonary:     Effort: No respiratory distress.     Breath sounds: No wheezing or rales.  Abdominal:     General: There is no distension.     Tenderness: There is no abdominal tenderness.  Musculoskeletal:        General: Swelling and tenderness present. Normal range of motion.     Cervical back: Normal range of motion.  Skin:    General: Skin is warm and dry.  Neurological:     General: No focal deficit present.     Mental Status: He is alert.     ED Results and Treatments Labs (all labs ordered are listed, but only abnormal results are displayed) Labs Reviewed - No data to display                                                                                                                         Radiology No results found.  Pertinent labs & imaging results that were available during my care of the patient were reviewed by me and considered in my medical decision making (see MDM for details).  Medications Ordered in ED Medications  lidocaine -EPINEPHrine  (XYLOCAINE  W/EPI) 2 %-1:200000 (PF) injection 10 mL (has no administration in time range)                                                                                                                                     Procedures .Incision and Drainage  Date/Time: 06/07/2023 5:21 PM  Performed by: Albertina Dixon, MD Authorized by: Albertina Dixon, MD   Location:    Type:  Abscess   Size:  0.5 cm   Location: finger. Pre-procedure details:    Skin preparation:  Chlorhexidine with alcohol Sedation:    Sedation type:  None Anesthesia:    Anesthesia method:  Nerve block   Block location:  Digital block   Block anesthetic:  Lidocaine  2% WITH epi   Block technique:  Ring block   Block injection procedure:  Anatomic landmarks identified, introduced needle, anatomic landmarks palpated and negative aspiration for blood   Block outcome:  Anesthesia achieved Procedure type:    Complexity:  Simple Procedure details:    Incision types:  Single straight   Drainage:  Purulent   Drainage amount:  Moderate   Wound treatment:  Wound left open Post-procedure details:    Procedure completion:  Tolerated well, no immediate complications   (including critical care time)  Medical Decision Making / ED Course   This patient presents to the ED for concern of finger pain and swelling, this involves an extensive number of treatment options, and is a complaint that carries with it a high risk of complications and morbidity.  The differential diagnosis includes paronychia, felon, cellulitis, fracture  MDM: Patient seen emergency room for evaluation of finger pain and swelling.  Physical exam with swelling and tender fluctuance along  the nailbed on the right thumb consistent with paronychia.  Performed and paronychia drained at bedside leading to expression of a moderate amount of pus.  Bactroban  applied and patient started on Duricef.  At this time he does not meet inpatient criteria for admission and will be discharged with outpatient follow-up.  Return precautions given of which he voiced understanding   Additional history obtained:  -External records from outside source obtained and reviewed including: Chart review including previous notes, labs, imaging, consultation notes   Medicines ordered and prescription drug management: Meds ordered this encounter  Medications   lidocaine -EPINEPHrine  (XYLOCAINE  W/EPI) 2 %-1:200000 (PF) injection 10 mL    -I have reviewed the patients home medicines and have made adjustments as needed  Critical interventions none   Social Determinants of Health:  Factors impacting patients care include: none   Reevaluation: After the interventions noted above, I reevaluated the patient and found that they have :improved  Co morbidities that complicate the patient evaluation  Past Medical History:  Diagnosis Date   HIV (human immunodeficiency virus infection) (HCC)       Dispostion: I considered admission for this patient, but at this time he does not meet inpatient criteria for admission and will be discharged with outpatient follow-up     Final Clinical Impression(s) / ED Diagnoses Final diagnoses:  None     @PCDICTATION @ HOCM   Afrika Brick, Lum, MD 06/07/23 1724

## 2023-06-07 NOTE — ED Provider Triage Note (Signed)
 Emergency Medicine Provider Triage Evaluation Note  Lee Bridges , a 33 y.o. male  was evaluated in triage.  Pt complains of right thumb pain.  Though he believes he was bitten by a spider, he does not recall actually seeing 1 of those.  Over the past 3 days he is developed lesion in the right thumb consistent with paronychia.  Review of Systems  Positive: Swelling, pain Negative: Loss of sensation  Physical Exam  BP 131/89 (BP Location: Left Arm)   Pulse (!) 104   Temp 98.1 F (36.7 C)   Resp 17   Ht 5' 2 (1.575 m)   Wt 59.9 kg   SpO2 100%   BMI 24.14 kg/m  Gen:   Awake, no distress peaking clearly Resp:  Normal effort no increased work of breathing MSK:   Moves extremities without difficulty fluctuance about the dorsum of the right thumb.  Medical Decision Making  Medically screening exam initiated at 9:41 AM.  Appropriate orders placed.  Seif Teichert was informed that the remainder of the evaluation will be completed by another provider, this initial triage assessment does not replace that evaluation, and the importance of remaining in the ED until their evaluation is complete.   Garrick Charleston, MD 06/07/23 [phone]

## 2023-06-07 NOTE — ED Triage Notes (Signed)
 Pt. Stated, I think I got bitten by spider bite but its down in the skin. # days ago

## 2023-06-07 NOTE — ED Notes (Signed)
 Dr. Posey Rea at bedside to drain paronychia.

## 2023-07-25 ENCOUNTER — Telehealth: Payer: Self-pay

## 2023-07-25 NOTE — Telephone Encounter (Signed)
 Patient called needing syphilis treatment.  Patient positive 03/2023  and never received treatment.  Patient scheduled to see Tresa Endo, [place] on 07/28/23.  Patient also now complains of blurry vision and headaches.  Tresa Endo, [place] and [place], NP informed of all information. Sherryl Valido Jonathon Resides, CMA

## 2023-07-28 ENCOUNTER — Ambulatory Visit (INDEPENDENT_AMBULATORY_CARE_PROVIDER_SITE_OTHER): Payer: 59 | Admitting: Physician Assistant

## 2023-07-28 ENCOUNTER — Encounter: Payer: Self-pay | Admitting: Physician Assistant

## 2023-07-28 ENCOUNTER — Emergency Department (HOSPITAL_COMMUNITY): Payer: MEDICAID

## 2023-07-28 ENCOUNTER — Encounter (HOSPITAL_COMMUNITY): Payer: Self-pay | Admitting: *Deleted

## 2023-07-28 ENCOUNTER — Inpatient Hospital Stay (HOSPITAL_COMMUNITY)
Admission: EM | Admit: 2023-07-28 | Discharge: 2023-08-03 | DRG: 975 | Disposition: A | Discharge status: Home / Self Care | Payer: MEDICAID | Attending: Internal Medicine | Admitting: Internal Medicine

## 2023-07-28 ENCOUNTER — Other Ambulatory Visit: Payer: Self-pay

## 2023-07-28 VITALS — BP 143/88 | HR 105 | Temp 98.0°F | Wt 120.0 lb

## 2023-07-28 DIAGNOSIS — Z91148 Patient's other noncompliance with medication regimen for other reason: Secondary | ICD-10-CM

## 2023-07-28 DIAGNOSIS — F17210 Nicotine dependence, cigarettes, uncomplicated: Secondary | ICD-10-CM | POA: Diagnosis present

## 2023-07-28 DIAGNOSIS — B20 Human immunodeficiency virus [HIV] disease: Secondary | ICD-10-CM | POA: Diagnosis not present

## 2023-07-28 DIAGNOSIS — B372 Candidiasis of skin and nail: Secondary | ICD-10-CM

## 2023-07-28 DIAGNOSIS — H538 Other visual disturbances: Secondary | ICD-10-CM | POA: Diagnosis present

## 2023-07-28 DIAGNOSIS — Z5900 Homelessness unspecified: Secondary | ICD-10-CM

## 2023-07-28 DIAGNOSIS — L258 Unspecified contact dermatitis due to other agents: Secondary | ICD-10-CM | POA: Diagnosis present

## 2023-07-28 DIAGNOSIS — A523 Neurosyphilis, unspecified: Secondary | ICD-10-CM

## 2023-07-28 DIAGNOSIS — R45851 Suicidal ideations: Secondary | ICD-10-CM | POA: Diagnosis present

## 2023-07-28 DIAGNOSIS — R519 Headache, unspecified: Principal | ICD-10-CM

## 2023-07-28 DIAGNOSIS — F332 Major depressive disorder, recurrent severe without psychotic features: Secondary | ICD-10-CM | POA: Diagnosis present

## 2023-07-28 DIAGNOSIS — Z1152 Encounter for screening for COVID-19: Secondary | ICD-10-CM

## 2023-07-28 DIAGNOSIS — Z8619 Personal history of other infectious and parasitic diseases: Secondary | ICD-10-CM | POA: Diagnosis present

## 2023-07-28 DIAGNOSIS — Z8249 Family history of ischemic heart disease and other diseases of the circulatory system: Secondary | ICD-10-CM

## 2023-07-28 DIAGNOSIS — B370 Candidal stomatitis: Secondary | ICD-10-CM | POA: Diagnosis not present

## 2023-07-28 DIAGNOSIS — Z79899 Other long term (current) drug therapy: Secondary | ICD-10-CM

## 2023-07-28 DIAGNOSIS — H5789 Other specified disorders of eye and adnexa: Secondary | ICD-10-CM | POA: Diagnosis present

## 2023-07-28 DIAGNOSIS — Z9151 Personal history of suicidal behavior: Secondary | ICD-10-CM

## 2023-07-28 DIAGNOSIS — H04129 Dry eye syndrome of unspecified lacrimal gland: Secondary | ICD-10-CM | POA: Diagnosis present

## 2023-07-28 DIAGNOSIS — D72819 Decreased white blood cell count, unspecified: Secondary | ICD-10-CM | POA: Diagnosis present

## 2023-07-28 DIAGNOSIS — Z91013 Allergy to seafood: Secondary | ICD-10-CM

## 2023-07-28 DIAGNOSIS — F331 Major depressive disorder, recurrent, moderate: Secondary | ICD-10-CM | POA: Diagnosis present

## 2023-07-28 DIAGNOSIS — E876 Hypokalemia: Secondary | ICD-10-CM | POA: Diagnosis present

## 2023-07-28 DIAGNOSIS — Z833 Family history of diabetes mellitus: Secondary | ICD-10-CM

## 2023-07-28 DIAGNOSIS — F319 Bipolar disorder, unspecified: Secondary | ICD-10-CM | POA: Diagnosis present

## 2023-07-28 DIAGNOSIS — F1490 Cocaine use, unspecified, uncomplicated: Secondary | ICD-10-CM | POA: Diagnosis present

## 2023-07-28 DIAGNOSIS — F1290 Cannabis use, unspecified, uncomplicated: Secondary | ICD-10-CM | POA: Diagnosis present

## 2023-07-28 DIAGNOSIS — F411 Generalized anxiety disorder: Secondary | ICD-10-CM | POA: Diagnosis present

## 2023-07-28 DIAGNOSIS — F1510 Other stimulant abuse, uncomplicated: Secondary | ICD-10-CM | POA: Diagnosis present

## 2023-07-28 LAB — COMPREHENSIVE METABOLIC PANEL
ALT: 11 U/L (ref 0–44)
AST: 20 U/L (ref 15–41)
Albumin: 3.7 g/dL (ref 3.5–5.0)
Alkaline Phosphatase: 50 U/L (ref 38–126)
Anion gap: 10 (ref 5–15)
BUN: 13 mg/dL (ref 6–20)
CO2: 21 mmol/L — ABNORMAL LOW (ref 22–32)
Calcium: 9.1 mg/dL (ref 8.9–10.3)
Chloride: 105 mmol/L (ref 98–111)
Creatinine, Ser: 0.96 mg/dL (ref 0.61–1.24)
GFR, Estimated: >60 mL/min (ref >60)
Glucose, Bld: 96 mg/dL (ref 70–99)
Potassium: 3.4 mmol/L — ABNORMAL LOW (ref 3.5–5.1)
Sodium: 136 mmol/L (ref 135–145)
Total Bilirubin: 0.6 mg/dL (ref 0.0–1.2)
Total Protein: 7.8 g/dL (ref 6.5–8.1)

## 2023-07-28 LAB — CBC
HCT: 38.8 % — ABNORMAL LOW (ref 39.0–52.0)
Hemoglobin: 12.8 g/dL — ABNORMAL LOW (ref 13.0–17.0)
MCH: 34.3 pg — ABNORMAL HIGH (ref 26.0–34.0)
MCHC: 33 g/dL (ref 30.0–36.0)
MCV: 104 fL — ABNORMAL HIGH (ref 80.0–100.0)
Platelets: 279 10*3/uL (ref 150–400)
RBC: 3.73 MIL/uL — ABNORMAL LOW (ref 4.22–5.81)
RDW: 12.7 % (ref 11.5–15.5)
WBC: 3.2 10*3/uL — ABNORMAL LOW (ref 4.0–10.5)
nRBC: 0 % (ref 0.0–0.2)

## 2023-07-28 LAB — URINALYSIS, ROUTINE W REFLEX MICROSCOPIC
Bilirubin Urine: NEGATIVE
Glucose, UA: NEGATIVE mg/dL
Hgb urine dipstick: NEGATIVE
Ketones, ur: NEGATIVE mg/dL
Leukocytes,Ua: NEGATIVE
Nitrite: NEGATIVE
Protein, ur: 30 mg/dL — AB
Specific Gravity, Urine: 1.029 (ref 1.005–1.030)
pH: 5 (ref 5.0–8.0)

## 2023-07-28 LAB — RESP PANEL BY RT-PCR (RSV, FLU A&B, COVID)  RVPGX2
Influenza A by PCR: NEGATIVE
Influenza B by PCR: NEGATIVE
Resp Syncytial Virus by PCR: NEGATIVE
SARS Coronavirus 2 by RT PCR: NEGATIVE

## 2023-07-28 LAB — LIPASE, BLOOD: Lipase: 33 U/L (ref 11–51)

## 2023-07-28 MED ORDER — ACETAMINOPHEN 500 MG PO TABS: 1000.0000 mg | ORAL_TABLET | Freq: Once | ORAL | Status: DC

## 2023-07-28 MED ORDER — MIDAZOLAM HCL 2 MG/2ML IJ SOLN
2.0000 mg | INTRAMUSCULAR | Status: AC
  Administered 2023-07-29: 2 mg via INTRAVENOUS
  Filled 2023-07-28: qty 2

## 2023-07-28 MED ORDER — ONDANSETRON 4 MG PO TBDP: 8.0000 mg | ORAL_TABLET | Freq: Once | ORAL | Status: DC

## 2023-07-28 MED ORDER — LIDOCAINE-EPINEPHRINE (PF) 2 %-1:200000 IJ SOLN
10.0000 mL | Freq: Once | INTRAMUSCULAR | Status: AC
  Administered 2023-07-29: 10 mL
  Filled 2023-07-28: qty 20

## 2023-07-28 NOTE — ED Provider Notes (Signed)
 [place] EMERGENCY DEPARTMENT AT [facility] Provider Note   CSN: [id number] Arrival date & time: 07/28/23  0957     History  Chief Complaint  Patient presents with   Emesis    Lee Bridges is a 33 y.o. male.  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  Patient reports that for the past few weeks he has been having headache with chills and fevers.  No neck pain.  Says that he is also been having intermittent blurry vision out of both eyes.  Also reports intermittent numbness and weakness of his upper and lower extremities.  Has not recorded his temperature at home.  Sent in from infectious disease clinic due to concerns for possible neurosyphilis.  Says that he was treated for syphilis in the past.       Home Medications Prior to Admission medications   Medication Sig Start Date End Date Taking? Authorizing Provider  bictegravir-emtricitabine-tenofovir AF (BIKTARVY) 50-200-25 MG TABS tablet Take 1 tablet by mouth daily. Patient not taking: Reported on 07/28/2023 12/23/22   Blanchard Kelch, NP      Allergies    Fish allergy and Shellfish allergy    Review of Systems   Review of Systems  Physical Exam Updated Vital Signs BP 115/73 (BP Location: Left Arm)   Pulse 69   Temp 98.5 F (36.9 C) (Oral)   Resp 15   Ht 5\' 2"  (1.575 m)   Wt 54.8 kg   SpO2 98%   BMI 22.10 kg/m  Physical Exam Vitals and nursing note reviewed.  Constitutional:      General: He is not in acute distress.    Appearance: He is well-developed.  HENT:     Head: Normocephalic and atraumatic.     Right Ear: External ear normal.     Left Ear: External ear normal.     Nose: Nose normal.  Eyes:     Extraocular Movements: Extraocular movements intact.     Conjunctiva/sclera: Conjunctivae normal.     Pupils: Pupils are equal, round, and reactive to light.   Neck:     Comments: No meningismus Cardiovascular:     Rate and Rhythm: Normal rate and regular rhythm.     Heart sounds: Normal heart sounds.  Pulmonary:     Effort: Pulmonary effort is normal. No respiratory distress.     Breath sounds: Normal breath sounds.  Musculoskeletal:     Cervical back: Normal range of motion and neck supple.     Right lower leg: No edema.     Left lower leg: No edema.  Skin:    General: Skin is warm and dry.  Neurological:     Mental Status: He is alert and oriented to person, place, and time. Mental status is at baseline.     Cranial Nerves: No cranial nerve deficit.     Sensory: No sensory deficit.     Motor: No weakness.  Psychiatric:        Mood and Affect: Mood normal.        Behavior: Behavior normal.     ED Results / Procedures / Treatments   Labs (all labs ordered are listed, but only abnormal results are displayed) Labs Reviewed  COMPREHENSIVE METABOLIC PANEL - Abnormal; Notable for the following components:      Result Value   Potassium 3.4 (*)    CO2 21 (*)  All other components within normal limits  CBC - Abnormal; Notable for the following components:   WBC 3.2 (*)    RBC 3.73 (*)    Hemoglobin 12.8 (*)    HCT 38.8 (*)    MCV 104.0 (*)    MCH 34.3 (*)    All other components within normal limits  URINALYSIS, ROUTINE W REFLEX MICROSCOPIC - Abnormal; Notable for the following components:   Protein, ur 30 (*)    Bacteria, UA RARE (*)    All other components within normal limits  CSF CELL COUNT WITH DIFFERENTIAL - Abnormal; Notable for the following components:   Color, CSF PINK (*)    Appearance, CSF HAZY (*)    RBC Count, CSF 2,000 (*)    WBC, CSF 12 (*)    Lymphs, CSF 91 (*)    Monocyte-Macrophage-Spinal Fluid 6 (*)    All other components within normal limits  CSF CELL COUNT WITH DIFFERENTIAL - Abnormal; Notable for the following components:   WBC, CSF 6 (*)    All other components within normal limits  CBC -  Abnormal; Notable for the following components:   WBC 3.0 (*)    RBC 3.51 (*)    Hemoglobin 12.1 (*)    HCT 36.3 (*)    MCV 103.4 (*)    MCH 34.5 (*)    All other components within normal limits  COMPREHENSIVE METABOLIC PANEL - Abnormal; Notable for the following components:   Potassium 3.3 (*)    Glucose, Bld 115 (*)    Calcium 8.6 (*)    Albumin 3.0 (*)    All other components within normal limits  DIFFERENTIAL - Abnormal; Notable for the following components:   Neutro Abs 1.4 (*)    All other components within normal limits  RAPID URINE DRUG SCREEN, HOSP PERFORMED - Abnormal; Notable for the following components:   Cocaine POSITIVE (*)    Benzodiazepines POSITIVE (*)    Tetrahydrocannabinol POSITIVE (*)    All other components within normal limits  T-HELPER CELLS (CD4) COUNT (NOT AT [facility]) - Abnormal; Notable for the following components:   CD4 T Cell Abs 171 (*)    CD4 % Helper T Cell 14 (*)    All other components within normal limits  RPR - Abnormal; Notable for the following components:   RPR Ser Ql Reactive (*)    All other components within normal limits  BASIC METABOLIC PANEL - Abnormal; Notable for the following components:   Glucose, Bld 106 (*)    All other components within normal limits  CBC WITH DIFFERENTIAL/PLATELET - Abnormal; Notable for the following components:   WBC 2.5 (*)    RBC 3.66 (*)    Hemoglobin 12.5 (*)    HCT 37.1 (*)    MCV 101.4 (*)    MCH 34.2 (*)    Neutro Abs 0.6 (*)    All other components within normal limits  RESP PANEL BY RT-PCR (RSV, FLU A&B, COVID)  RVPGX2  CSF CULTURE W GRAM STAIN  FUNGUS CULTURE WITH STAIN  LIPASE, BLOOD  PROTEIN AND GLUCOSE, CSF  MENINGITIS/ENCEPHALITIS PANEL (CSF)  EPSTEIN-BARR VIRUS VCA, IGM  CMV DNA, QUANTITATIVE, PCR  CRYPTOCOCCAL ANTIGEN, CSF  HIV-1 RNA QUANT-NO REFLEX-BLD  EPSTEIN BARR VRS(EBV DNA BY PCR)  PATHOLOGIST SMEAR REVIEW  MAGNESIUM  VDRL, CSF  JC VIRUS, PCR CSF  T.PALLIDUM AB, TOTAL   HISTOPLASMA ANTIGEN, URINE  GC/CHLAMYDIA PROBE AMP () NOT AT [facility]    EKG None  Radiology No results found.  Procedures Lumbar Puncture  Date/Time: 07/29/2023 12:45 AM  Performed by: Rondel Baton, MD Authorized by: Rondel Baton, MD   Consent:    Consent obtained:  Written   Consent given by:  Patient   Risks discussed:  Bleeding, infection, repeat procedure, pain, headache and nerve damage   Alternatives discussed:  No treatment Universal protocol:    Test results available: yes     Imaging studies available: yes     Patient identity confirmed:  Verbally with patient Pre-procedure details:    Procedure purpose:  Diagnostic   Preparation: Patient was prepped and draped in usual sterile fashion   Anesthesia:    Anesthesia method:  Local infiltration   Local anesthetic:  Lidocaine 1% w/o epi Procedure details:    Lumbar space:  L3-L4 interspace   Patient position:  L lateral decubitus   Needle gauge:  20   Needle type:  Diamond point   Needle length (in):  3.5   Ultrasound guidance: no     Number of attempts:  2   Fluid appearance:  Blood-tinged then clearing   Tubes of fluid:  4   Total volume (ml):  8 Post-procedure details:    Puncture site:  Adhesive bandage applied and direct pressure applied   Procedure completion:  Tolerated well, no immediate complications     Medications Ordered in ED Medications  acetaminophen (TYLENOL) tablet 650 mg (650 mg Oral Given 07/31/23 0214)    Or  acetaminophen (TYLENOL) suppository 650 mg ( Rectal See Alternative 07/31/23 0214)  ondansetron (ZOFRAN) tablet 4 mg (has no administration in time range)    Or  ondansetron (ZOFRAN) injection 4 mg (has no administration in time range)  penicillin G potassium 12 Million Units in dextrose 5 % 500 mL CONTINUOUS infusion (12 Million Units Intravenous New Bag/Given 07/31/23 1459)  magic mouthwash w/lidocaine (has no administration in time range)  terbinafine (LAMISIL)  1 % cream ( Topical Given 07/31/23 0918)  feeding supplement (ENSURE ENLIVE / ENSURE PLUS) liquid 237 mL (237 mLs Oral Patient Refused/Not Given 07/31/23 1500)  methocarbamol (ROBAXIN) tablet 500 mg (500 mg Oral Given 07/30/23 1019)  senna-docusate (Senokot-S) tablet 1 tablet (1 tablet Oral Given 07/31/23 0914)  bictegravir-emtricitabine-tenofovir AF (BIKTARVY) 50-200-25 MG per tablet 1 tablet (1 tablet Oral Given 07/31/23 0914)  sulfamethoxazole-trimethoprim (BACTRIM) 400-80 MG per tablet 1 tablet (1 tablet Oral Given 07/31/23 0914)  enoxaparin (LOVENOX) injection 40 mg (40 mg Subcutaneous Given 07/31/23 1325)  QUEtiapine (SEROQUEL) tablet 50 mg (has no administration in time range)  lidocaine-EPINEPHrine (XYLOCAINE W/EPI) 2 %-1:200000 (PF) injection 10 mL (10 mLs Other Given 07/29/23 0124)  midazolam (VERSED) injection 2 mg (2 mg Intravenous Given 07/29/23 0018)  potassium chloride SA (KLOR-CON M) CR tablet 20 mEq (20 mEq Oral Given 07/29/23 0528)  gadobutrol (GADAVIST) 1 MMOL/ML injection 5 mL (5 mLs Intravenous Contrast Given 07/29/23 1130)  magnesium sulfate IVPB 2 g 50 mL (2 g Intravenous New Bag/Given 07/31/23 1324)    ED Course/ Medical Decision Making/ A&P Clinical Course as of 07/31/23 1734  Thu Jul 28, 2023  2333 Signed out to Dr Clayborne Dana [RP]  Caleen Essex Jul 29, 2023  0044 Dr Janalyn Shy from hospitalist will evaluate for admission [RP]    Clinical Course User Index [RP] Rondel Baton, MD  Medical Decision Making Amount and/or Complexity of Data Reviewed Labs: ordered. Radiology: ordered.  Risk OTC drugs. Prescription drug management. Decision regarding hospitalization.   Lee Bridges [place] is a 33 y.o. male with comorbidities that complicate the patient evaluation including AIDS and syphilis who presents emergency department with headache, chills, fevers, vision changes, and numbness and weakness of his arms and legs.    Initial Ddx:  Neurosyphilis, toxoplasma,  Mengingitis, ICH, secondary syphilis, headache   MDM/Course:  Patient presents to the emergency department with headache and multiple neurosymptoms. Sent in from ID clinic with concern for neurosyphilis. Overall well appearing at this time.  Low concern for bacterial meningitis.  CT head without acute findings.  LP performed which did have somewhat of a traumatic tap and was sent for analysis.  Will start the patient on penicillin and admit to hospitalist.    This patient presents to the ED for concern of complaints listed in HPI, this involves an extensive number of treatment options, and is a complaint that carries with it a high risk of complications and morbidity. Disposition including potential need for admission considered.   Dispo: Admit to Floor  Records reviewed Outpatient Clinic Notes The following labs were independently interpreted: Chemistry and show no acute abnormality I independently reviewed the following imaging with scope of interpretation limited to determining acute life threatening conditions related to emergency care: CT Head and agree with the radiologist interpretation with the following exceptions: none I personally reviewed and interpreted cardiac monitoring: normal sinus rhythm  I personally reviewed and interpreted the pt's EKG: see above for interpretation  I have reviewed the patients home medications and made adjustments as needed Consults: Hospitalist  Portions of this note were generated with Scientist, clinical (histocompatibility and immunogenetics). Dictation errors may occur despite best attempts at proofreading.     Final Clinical Impression(s) / ED Diagnoses Final diagnoses:  Nonintractable headache, unspecified chronicity pattern, unspecified headache type  AIDS (acquired immune deficiency syndrome) (HCC)  Neurosyphilis    Rx / DC Orders ED Discharge Orders     None         Rondel Baton, MD 07/31/23 1734

## 2023-07-28 NOTE — ED Provider Notes (Incomplete)
11:33 PM Assumed care from Dr. Eloise Harman, please see their note for full history, physical and decision making until this point. In brief this is a 33 y.o. year old male who presented to the ED tonight with Emesis     ID involved, pending LP initial labs and admit.   Discharge instructions, including strict return precautions for new or worsening symptoms, given. Patient and/or family verbalized understanding and agreement with the plan as described.   Labs, studies and imaging reviewed by myself and considered in medical decision making if ordered. Imaging interpreted by radiology.  Labs Reviewed  COMPREHENSIVE METABOLIC PANEL - Abnormal; Notable for the following components:      Result Value   Potassium 3.4 (*)    CO2 21 (*)    All other components within normal limits  CBC - Abnormal; Notable for the following components:   WBC 3.2 (*)    RBC 3.73 (*)    Hemoglobin 12.8 (*)    HCT 38.8 (*)    MCV 104.0 (*)    MCH 34.3 (*)    All other components within normal limits  URINALYSIS, ROUTINE W REFLEX MICROSCOPIC - Abnormal; Notable for the following components:   Protein, ur 30 (*)    Bacteria, UA RARE (*)    All other components within normal limits  RESP PANEL BY RT-PCR (RSV, FLU A&B, COVID)  RVPGX2  CSF CULTURE W GRAM STAIN  FUNGUS CULTURE WITH STAIN  LIPASE, BLOOD  T-HELPER CELLS (CD4) COUNT (NOT AT [facility])  RPR  DIFFERENTIAL  CSF CELL COUNT WITH DIFFERENTIAL  PROTEIN AND GLUCOSE, CSF  CRYPTOCOCCAL ANTIGEN, CSF  MENINGITIS/ENCEPHALITIS PANEL (CSF)  VDRL, CSF  GC/CHLAMYDIA PROBE AMP ([place]) NOT AT [facility]    CT Head Wo Contrast  Final Result      No follow-ups on file.

## 2023-07-28 NOTE — Patient Instructions (Signed)
 Lee Bridges you must prioritize your health.  I recommend you going to emergency department for treatment and evaluation.

## 2023-07-28 NOTE — ED Notes (Signed)
 Refusing VS reassessment

## 2023-07-28 NOTE — ED Notes (Signed)
 Patient returned from CT

## 2023-07-28 NOTE — ED Notes (Signed)
 Patient transported to CT

## 2023-07-28 NOTE — Progress Notes (Signed)
 Subjective:    Patient ID: Lee Bridges, male    DOB: [birth year], 33 y.o.   MRN: [id number]  Chief Complaint  Patient presents with   Follow-up    Fevers, headaches, vision problems, nausea, rash  Taking Biktarvy off and on, no problems with getting it from the pharmacy, is mailed to him every month     HPI:  Lee Bridges is a 33 y.o. male who presents to clinic with worsening symptoms.  He states symptoms began 2 weeks ago and consist of blurry vision, nausea, vomiting daily, fatigue, bilateral rashes along axilla, ulcers in mouth, dysphagia, "pus and blood" from penis, chills, hot flashes, bodyaches, intermittent fevers, decreased appetite, "sharp pains in spine", and feels short of breath while walking. He has poor adherence with Biktarvy and Bactrim DS.  He has a boyfriend for the last 6 months, mother and sister who are all supportive of patient. He is working at the [employer] 10 am to 5 pm daily. Last OPV with provider Rexene Alberts, NP 12/23/22.Labs 7/25 CD4 121, VL 57,700, RPR titer 1:8 serofast  Diagnosed with syphilis at ED 03/11/2023 titer 1:32 -they were unable to contact pt with results after several attempts after they notified HD the HD was able to notify patient of reactive syphilis result, however he did not receive treatment. 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   He is consuming 5 ounces of wine every 2 days, 1 ppd of cigarettes, increased smoking cocaine. He has discontinued meth use after a discussion with psychiatrist regarding effectiveness of medications while using illicit drugs 3 months ago.  However, this is when cocaine use increased.  Eddie psychiatrist convinced him to stop meth 3 months ago. Stopped meth and started cocaine.    Allergies  Allergen Reactions   Fish Allergy Hives and Nausea And Vomiting   Shellfish Allergy Hives and Nausea And Vomiting      No facility-administered medications prior to visit.   Outpatient Medications Prior to Visit  Medication Sig  Dispense Refill   bictegravir-emtricitabine-tenofovir AF (BIKTARVY) 50-200-25 MG TABS tablet Take 1 tablet by mouth daily. (Patient not taking: Reported on 07/28/2023) 30 tablet 11     Past Medical History:  Diagnosis Date   HIV (human immunodeficiency virus infection) (HCC)      History reviewed. No pertinent surgical history.     Review of Systems  Constitutional:  Positive for activity change, appetite change, chills, diaphoresis, fatigue, fever and unexpected weight change.  HENT:  Positive for mouth sores, sore throat and trouble swallowing.   Eyes:  Positive for visual disturbance.  Respiratory:  Positive for shortness of breath. Negative for cough and wheezing.   Cardiovascular:  Negative for chest pain, palpitations and leg swelling.  Gastrointestinal:  Positive for constipation, nausea and vomiting. Negative for rectal pain.  Genitourinary:  Positive for penile discharge.  Musculoskeletal:  Positive for back pain, myalgias and neck pain.  Skin:  Positive for rash (axilla).  Allergic/Immunologic: Positive for immunocompromised state.  Neurological:  Positive for light-headedness and headaches. Negative for dizziness, seizures and weakness.  Hematological:  Positive for adenopathy.  Psychiatric/Behavioral:  Negative for agitation, behavioral problems, hallucinations, self-injury, sleep disturbance and suicidal ideas. The patient is not nervous/anxious.       Objective:    BP (!) 143/88   Pulse (!) 105   Temp 98 F (36.7 C) (Oral)   Wt 120 lb (54.4 kg)   SpO2 93%   BMI 21.95 kg/m  Nursing note and vital signs reviewed.  Physical Exam Vitals and nursing note reviewed.  Constitutional:      General: He is awake. He is not in acute distress.    Appearance: Normal appearance. He is underweight. He is not ill-appearing, toxic-appearing or diaphoretic.  HENT:     Head: Normocephalic and atraumatic.     Mouth/Throat:     Mouth: Oral lesions present.     Dentition:  Gum lesions present.     Tongue: Lesions present.     Palate: Lesions present.     Pharynx: Posterior oropharyngeal erythema present.     Tonsils: No tonsillar exudate.   Eyes:     General: Lids are normal.        Right eye: No discharge.        Left eye: No discharge.     Extraocular Movements: Extraocular movements intact.     Right eye: Normal extraocular motion.     Left eye: Normal extraocular motion.     Conjunctiva/sclera: Conjunctivae normal.  Cardiovascular:     Rate and Rhythm: Regular rhythm. Tachycardia present.     Pulses: Normal pulses.     Heart sounds: Normal heart sounds.  Pulmonary:     Effort: No tachypnea or respiratory distress.     Breath sounds: Normal breath sounds and air entry.  Abdominal:     General: Bowel sounds are decreased.     Palpations: Abdomen is soft. There is no hepatomegaly or splenomegaly.     Tenderness: There is no guarding. Negative signs include Murphy's sign.  Musculoskeletal:     Cervical back: Normal range of motion and neck supple. No rigidity.     Right lower leg: No edema.     Left lower leg: No edema.  Lymphadenopathy:     Cervical: Cervical adenopathy present.     Left cervical: Superficial cervical adenopathy present.  Skin:    Findings: Rash (bilateral axilla-dry hyperpigmented scaly well demarcated border with central clearing, erythema appears to be candidal infection) present.  Neurological:     General: No focal deficit present.     Mental Status: He is alert and oriented to person, place, and time.     Cranial Nerves: No cranial nerve deficit.     Gait: Gait normal.  Psychiatric:        Mood and Affect: Mood normal.        Behavior: Behavior normal. Behavior is cooperative.        Thought Content: Thought content normal.        Judgment: Judgment normal.         03/29/2023    8:45 AM 12/28/2022   10:49 AM 12/23/2022   10:38 AM 11/19/2022    4:42 PM 01/29/2022    3:47 PM  Depression screen PHQ 2/9  Decreased  Interest   0    Down, Depressed, Hopeless   0    PHQ - 2 Score   0    Altered sleeping       Tired, decreased energy       Change in appetite       Feeling bad or failure about yourself        Trouble concentrating       Moving slowly or fidgety/restless       Suicidal thoughts       PHQ-9 Score       Difficult doing work/chores          Information is confidential and restricted. Go to Review Flowsheets to unlock  data.       Assessment & Plan:  Discussed with patient the need to receive treatment in the hospital for further evaluation and necessary treatment as I suspect neurosyphilis and AIDS being the source of his symptoms and potential for additional OIs (candidiasis being evident on exam)   We had a long discussion on the need to make his health a priority.  I personally walked him to the emergency department and left message for triage nurse.  Adherency to his ART and Bactrim are of paramount importance for his health.  The need to discontinue smoking and cocaine.    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   Weight loss, unintentional 08/04/2021   Hallucinations 07/11/2021   Moderate episode of recurrent major depressive disorder (HCC) 06/12/2021   Generalized anxiety disorder 04/09/2021   Severe episode of recurrent major depressive disorder, without psychotic features (HCC) 04/09/2021   Bipolar affective disorder, current episode hypomanic (HCC) 04/09/2021   Cocaine abuse (HCC) 03/23/2021   HIV (human immunodeficiency virus infection) (HCC) 03/20/2021   History of syphilis 03/20/2021     Problem List Items Addressed This Visit   None Visit Diagnoses       AIDS (acquired immune deficiency syndrome) (HCC)    -  Primary     Neurosyphilis         Oral candidiasis         Candidiasis of skin            I am having Roseanne Kaufman "[name]" maintain his Biktarvy.   No orders of the defined  types were placed in this encounter.    Follow-up: Return in about 4 weeks (around 08/25/2023) for follow up.

## 2023-07-28 NOTE — ED Triage Notes (Signed)
 Pt states that he has AIDS and he has been feeling unwell with n/v, ha and generally feeling unwell for about a month.

## 2023-07-28 NOTE — ED Triage Notes (Signed)
 Pt also reports rashes and states that his PCP stephanie Dixon walked him over to have further workup.

## 2023-07-28 NOTE — ED Provider Triage Note (Signed)
 Emergency Medicine Provider Triage Evaluation Note  Lee Bridges , a 33 y.o. male  was evaluated in triage.  Pt complains of multiple complaints.  Nausea, rash in his left armpit, vomiting, generalized weakness.  Symptoms present for last couple of weeks.  Intermittently on Biktarvy but has not taken in about a month..  Review of Systems  Positive: See above Negative:   Physical Exam  BP (!) 144/116 (BP Location: Left Arm)   Pulse (!) 109   Temp 98.5 F (36.9 C)   Resp 18   SpO2 99%  Gen:   Awake, no distress   Resp:  Normal effort  MSK:   Moves extremities without difficulty  Other:  Raised plaque-like rash left axilla.  No abdominal tenderness.  Oral candidiasis.  Medical Decision Making  Medically screening exam initiated at 12:18 PM.  Appropriate orders placed.  Orlandus Borowski was informed that the remainder of the evaluation will be completed by another provider, this initial triage assessment does not replace that evaluation, and the importance of remaining in the ED until their evaluation is complete.     Peter Garter, [place] 07/28/23 1219

## 2023-07-29 ENCOUNTER — Encounter (HOSPITAL_COMMUNITY): Payer: Self-pay | Admitting: Internal Medicine

## 2023-07-29 ENCOUNTER — Inpatient Hospital Stay (HOSPITAL_COMMUNITY): Payer: MEDICAID

## 2023-07-29 DIAGNOSIS — Z1152 Encounter for screening for COVID-19: Secondary | ICD-10-CM | POA: Diagnosis not present

## 2023-07-29 DIAGNOSIS — Z833 Family history of diabetes mellitus: Secondary | ICD-10-CM | POA: Diagnosis not present

## 2023-07-29 DIAGNOSIS — Z8249 Family history of ischemic heart disease and other diseases of the circulatory system: Secondary | ICD-10-CM | POA: Diagnosis not present

## 2023-07-29 DIAGNOSIS — A523 Neurosyphilis, unspecified: Secondary | ICD-10-CM | POA: Diagnosis present

## 2023-07-29 DIAGNOSIS — B20 Human immunodeficiency virus [HIV] disease: Secondary | ICD-10-CM | POA: Diagnosis present

## 2023-07-29 DIAGNOSIS — Z91148 Patient's other noncompliance with medication regimen for other reason: Secondary | ICD-10-CM | POA: Diagnosis not present

## 2023-07-29 DIAGNOSIS — Z5900 Homelessness unspecified: Secondary | ICD-10-CM | POA: Diagnosis not present

## 2023-07-29 DIAGNOSIS — F1510 Other stimulant abuse, uncomplicated: Secondary | ICD-10-CM | POA: Diagnosis present

## 2023-07-29 DIAGNOSIS — R197 Diarrhea, unspecified: Secondary | ICD-10-CM | POA: Diagnosis not present

## 2023-07-29 DIAGNOSIS — F319 Bipolar disorder, unspecified: Secondary | ICD-10-CM | POA: Diagnosis present

## 2023-07-29 DIAGNOSIS — K1370 Unspecified lesions of oral mucosa: Secondary | ICD-10-CM

## 2023-07-29 DIAGNOSIS — E876 Hypokalemia: Secondary | ICD-10-CM | POA: Diagnosis present

## 2023-07-29 DIAGNOSIS — B370 Candidal stomatitis: Secondary | ICD-10-CM

## 2023-07-29 DIAGNOSIS — Z9151 Personal history of suicidal behavior: Secondary | ICD-10-CM | POA: Diagnosis not present

## 2023-07-29 DIAGNOSIS — D72819 Decreased white blood cell count, unspecified: Secondary | ICD-10-CM | POA: Diagnosis present

## 2023-07-29 DIAGNOSIS — Z91013 Allergy to seafood: Secondary | ICD-10-CM | POA: Diagnosis not present

## 2023-07-29 DIAGNOSIS — F1490 Cocaine use, unspecified, uncomplicated: Secondary | ICD-10-CM | POA: Diagnosis present

## 2023-07-29 DIAGNOSIS — F1290 Cannabis use, unspecified, uncomplicated: Secondary | ICD-10-CM | POA: Diagnosis present

## 2023-07-29 DIAGNOSIS — H04129 Dry eye syndrome of unspecified lacrimal gland: Secondary | ICD-10-CM | POA: Diagnosis present

## 2023-07-29 DIAGNOSIS — L258 Unspecified contact dermatitis due to other agents: Secondary | ICD-10-CM | POA: Diagnosis present

## 2023-07-29 DIAGNOSIS — F17210 Nicotine dependence, cigarettes, uncomplicated: Secondary | ICD-10-CM | POA: Diagnosis present

## 2023-07-29 DIAGNOSIS — Z8619 Personal history of other infectious and parasitic diseases: Secondary | ICD-10-CM | POA: Diagnosis not present

## 2023-07-29 DIAGNOSIS — F331 Major depressive disorder, recurrent, moderate: Secondary | ICD-10-CM | POA: Diagnosis not present

## 2023-07-29 DIAGNOSIS — R45851 Suicidal ideations: Secondary | ICD-10-CM | POA: Diagnosis present

## 2023-07-29 DIAGNOSIS — F411 Generalized anxiety disorder: Secondary | ICD-10-CM | POA: Diagnosis present

## 2023-07-29 DIAGNOSIS — H538 Other visual disturbances: Secondary | ICD-10-CM | POA: Diagnosis present

## 2023-07-29 DIAGNOSIS — H5789 Other specified disorders of eye and adnexa: Secondary | ICD-10-CM | POA: Diagnosis present

## 2023-07-29 DIAGNOSIS — F332 Major depressive disorder, recurrent severe without psychotic features: Secondary | ICD-10-CM | POA: Diagnosis not present

## 2023-07-29 DIAGNOSIS — R519 Headache, unspecified: Secondary | ICD-10-CM | POA: Diagnosis present

## 2023-07-29 DIAGNOSIS — Z79899 Other long term (current) drug therapy: Secondary | ICD-10-CM | POA: Diagnosis not present

## 2023-07-29 LAB — CBC
HCT: 36.3 % — ABNORMAL LOW (ref 39.0–52.0)
Hemoglobin: 12.1 g/dL — ABNORMAL LOW (ref 13.0–17.0)
MCH: 34.5 pg — ABNORMAL HIGH (ref 26.0–34.0)
MCHC: 33.3 g/dL (ref 30.0–36.0)
MCV: 103.4 fL — ABNORMAL HIGH (ref 80.0–100.0)
Platelets: 251 10*3/uL (ref 150–400)
RBC: 3.51 MIL/uL — ABNORMAL LOW (ref 4.22–5.81)
RDW: 12.8 % (ref 11.5–15.5)
WBC: 3 10*3/uL — ABNORMAL LOW (ref 4.0–10.5)
nRBC: 0 % (ref 0.0–0.2)

## 2023-07-29 LAB — CSF CELL COUNT WITH DIFFERENTIAL
Lymphs, CSF: 91 % — ABNORMAL HIGH (ref 40–80)
Monocyte-Macrophage-Spinal Fluid: 6 % — ABNORMAL LOW (ref 15–45)
RBC Count, CSF: 0 /mm3
RBC Count, CSF: 2000 /mm3 — ABNORMAL HIGH
Segmented Neutrophils-CSF: 3 % (ref 0–6)
Tube #: 1
Tube #: 4
WBC, CSF: 12 /mm3 (ref 0–5)
WBC, CSF: 6 /mm3 — ABNORMAL HIGH (ref 0–5)

## 2023-07-29 LAB — DIFFERENTIAL
Abs Immature Granulocytes: 0.03 10*3/uL (ref 0.00–0.07)
Basophils Absolute: 0 10*3/uL (ref 0.0–0.1)
Basophils Relative: 0 %
Eosinophils Absolute: 0.3 10*3/uL (ref 0.0–0.5)
Eosinophils Relative: 10 %
Immature Granulocytes: 1 %
Lymphocytes Relative: 30 %
Lymphs Abs: 0.9 10*3/uL (ref 0.7–4.0)
Monocytes Absolute: 0.4 10*3/uL (ref 0.1–1.0)
Monocytes Relative: 13 %
Neutro Abs: 1.4 10*3/uL — ABNORMAL LOW (ref 1.7–7.7)
Neutrophils Relative %: 46 %

## 2023-07-29 LAB — RAPID URINE DRUG SCREEN, HOSP PERFORMED
Amphetamines: NOT DETECTED
Barbiturates: NOT DETECTED
Benzodiazepines: POSITIVE — AB
Cocaine: POSITIVE — AB
Opiates: NOT DETECTED
Tetrahydrocannabinol: POSITIVE — AB

## 2023-07-29 LAB — MENINGITIS/ENCEPHALITIS PANEL (CSF)

## 2023-07-29 LAB — T-HELPER CELLS (CD4) COUNT (NOT AT ARMC)
CD4 % Helper T Cell: 14 % — ABNORMAL LOW (ref 33–65)
CD4 T Cell Abs: 171 /uL — ABNORMAL LOW (ref 400–1790)

## 2023-07-29 LAB — RPR
RPR Ser Ql: REACTIVE — AB
RPR Titer: >=1:256 {titer}

## 2023-07-29 LAB — CRYPTOCOCCAL ANTIGEN, CSF: Crypto Ag: NEGATIVE

## 2023-07-29 LAB — COMPREHENSIVE METABOLIC PANEL
ALT: 10 U/L (ref 0–44)
AST: 18 U/L (ref 15–41)
Albumin: 3 g/dL — ABNORMAL LOW (ref 3.5–5.0)
Alkaline Phosphatase: 50 U/L (ref 38–126)
Anion gap: 10 (ref 5–15)
BUN: 12 mg/dL (ref 6–20)
CO2: 24 mmol/L (ref 22–32)
Calcium: 8.6 mg/dL — ABNORMAL LOW (ref 8.9–10.3)
Chloride: 106 mmol/L (ref 98–111)
Creatinine, Ser: 1.02 mg/dL (ref 0.61–1.24)
GFR, Estimated: >60 mL/min (ref >60)
Glucose, Bld: 115 mg/dL — ABNORMAL HIGH (ref 70–99)
Potassium: 3.3 mmol/L — ABNORMAL LOW (ref 3.5–5.1)
Sodium: 140 mmol/L (ref 135–145)
Total Bilirubin: 0.5 mg/dL (ref 0.0–1.2)
Total Protein: 7.1 g/dL (ref 6.5–8.1)

## 2023-07-29 LAB — PATHOLOGIST SMEAR REVIEW

## 2023-07-29 LAB — PROTEIN AND GLUCOSE, CSF
Glucose, CSF: 63 mg/dL (ref 40–70)
Total  Protein, CSF: 21 mg/dL (ref 15–45)

## 2023-07-29 MED ORDER — ACETAMINOPHEN 325 MG PO TABS
650.0000 mg | ORAL_TABLET | Freq: Four times a day (QID) | ORAL | Status: DC | PRN
  Administered 2023-07-30 – 2023-08-01 (×4): 650 mg via ORAL
  Filled 2023-07-29 (×5): qty 2

## 2023-07-29 MED ORDER — SODIUM CHLORIDE 0.9 % IV SOLN: 250.0000 mL | INTRAVENOUS | Status: DC | PRN

## 2023-07-29 MED ORDER — MAGIC MOUTHWASH W/LIDOCAINE: 10.0000 mL | Freq: Three times a day (TID) | ORAL | Status: DC | PRN

## 2023-07-29 MED ORDER — ONDANSETRON HCL 4 MG/2ML IJ SOLN: 4.0000 mg | Freq: Four times a day (QID) | INTRAMUSCULAR | Status: DC | PRN

## 2023-07-29 MED ORDER — TERBINAFINE HCL 1 % EX CREA
TOPICAL_CREAM | Freq: Two times a day (BID) | CUTANEOUS | Status: DC
  Administered 2023-07-29 – 2023-08-02 (×4): 1 via TOPICAL
  Filled 2023-07-29 (×2): qty 12

## 2023-07-29 MED ORDER — FLUCONAZOLE IN SODIUM CHLORIDE 200-0.9 MG/100ML-% IV SOLN
200.0000 mg | INTRAVENOUS | Status: DC
  Administered 2023-07-29: 200 mg via INTRAVENOUS
  Filled 2023-07-29: qty 100

## 2023-07-29 MED ORDER — SODIUM CHLORIDE 0.9% FLUSH: 3.0000 mL | INTRAVENOUS | Status: DC | PRN

## 2023-07-29 MED ORDER — PENICILLIN G POTASSIUM 20000000 UNITS IJ SOLR
12.0000 10*6.[IU] | Freq: Two times a day (BID) | INTRAVENOUS | Status: DC
  Administered 2023-07-29 – 2023-08-03 (×13): 12 10*6.[IU] via INTRAVENOUS
  Filled 2023-07-29 (×13): qty 12

## 2023-07-29 MED ORDER — POTASSIUM CHLORIDE CRYS ER 20 MEQ PO TBCR
20.0000 meq | EXTENDED_RELEASE_TABLET | Freq: Once | ORAL | Status: AC
  Administered 2023-07-29: 20 meq via ORAL
  Filled 2023-07-29: qty 1

## 2023-07-29 MED ORDER — SODIUM CHLORIDE 0.9% FLUSH
3.0000 mL | Freq: Two times a day (BID) | INTRAVENOUS | Status: DC
  Administered 2023-07-29 – 2023-07-30 (×3): 3 mL via INTRAVENOUS

## 2023-07-29 MED ORDER — ONDANSETRON HCL 4 MG PO TABS: 4.0000 mg | ORAL_TABLET | Freq: Four times a day (QID) | ORAL | Status: DC | PRN

## 2023-07-29 MED ORDER — ENSURE ENLIVE PO LIQD
237.0000 mL | Freq: Two times a day (BID) | ORAL | Status: DC
  Administered 2023-07-30 – 2023-08-03 (×5): 237 mL via ORAL

## 2023-07-29 MED ORDER — SODIUM CHLORIDE 0.9 % IV SOLN: 100.0000 mg | INTRAVENOUS | Status: DC

## 2023-07-29 MED ORDER — GADOBUTROL 1 MMOL/ML IV SOLN
5.0000 mL | Freq: Once | INTRAVENOUS | Status: AC | PRN
  Administered 2023-07-29: 5 mL via INTRAVENOUS

## 2023-07-29 MED ORDER — BICTEGRAVIR-EMTRICITAB-TENOFOV 50-200-25 MG PO TABS: 1.0000 | ORAL_TABLET | Freq: Every day | ORAL | Status: DC

## 2023-07-29 MED ORDER — ACETAMINOPHEN 650 MG RE SUPP: 650.0000 mg | Freq: Four times a day (QID) | RECTAL | Status: DC | PRN

## 2023-07-29 NOTE — Progress Notes (Signed)
 Patient returned to unit.

## 2023-07-29 NOTE — Consult Note (Signed)
 S: 33 y.o. BM w/ h/o HIV, substance use, bipolar disorder admitted for untreated syphilis since 03/2023, with concern for neurosyphilis.   O: Vasc (near) OD- 20/25, OS 20/25 IOP: normal to palpation OU P: ERRL OU, neg APD OU  Anterior segment: wnl OU  Posterior segment: wnl OU, c/d 0.3 OU (no evidence of vascultis/intraretinal hemorrhage/retinitis OU)  A/P: Intermittent blurry vision OU- no evidence of intraocular syphilis, likely 2/2 dry eye syndrome, okay to use artificial tears 1-4x/day PRN irritation/blurry vision, f/u w/ regular eye doctor as outpatient prn, cont treatment per ID for HIV/possible neurosyphilis, f/u w/ ophthalmology prn  Deno Etienne, MD 4:39 PM

## 2023-07-29 NOTE — Progress Notes (Signed)
 [facility] PROGRESS NOTE  Lee Bridges (DOB: 1991/05/24) ZOX:[id number] PCP: Blanchard Kelch, NP  Brief Narrative: Lee Bridges is a 33 y.o. male with a history of HIV, substance use, bipolar disorder who presented to the ED on 07/28/2023 who was walked to the ED from [facility] for work up of suspected AIDS, neurosyphilis.  Subjective: Feels tired, mouth hurts when swallowing, left armpit itches, no headache. Has intermittent blurry vision, no double vision, and some discharge from eyes at times (none currently). No fever.   Objective: BP 114/78   Pulse 78   Temp 98.5 F (36.9 C)   Resp 17   SpO2 100%   Gen: No distress, resting quietly HEENT: PERRL, conjunctivae clear Neck: No nuchal rigidity Pulm: Clear, nonlabored  CV: RRR GI: Soft, NT, ND, +BS  Neuro: Alert and oriented. No new focal deficits. Ext: Warm, no deformities. Skin: Fungal infection left axilla. LP site mildly tender without discharge/bleeding, hematoma.   CSF: neg gram stain, RBCs 2k, WBC 12, lymphocytic, protein 21.  CT head: No acute abnormalities. ANC 1.4k.  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  - Continue PCN 68M units IV q12h x10-14 days - ID consult appreciate, confirmed with Dr. Renold Khaleb. - Follow up CSF studies including culture, JC virus, VDRL, fungal culture. Follow up CMV, EBV, GC/Chl probe, RPR.  Oropharyngeal candidiasis:  - Continue fluconazole 200mg  IV daily - Add magic mouthwash prn  HIV disease: Last CD4 in Oct 2024 was 205. VL was 10.7k.  - Continue biktarvy, struggle with adherence documented. - Follow up repeat CD4 count, VL.   Tobacco use:  - Nicotine patch, cessation counseling  Cocaine use:  - UDS  Hypokalemia:  - Supplemented  Depression, anxiety, bipolar disorder: No home medications noted. Mood stable at this time.  [name], MD [facility] [email or web address] 07/29/2023, 9:06 AM

## 2023-07-29 NOTE — Progress Notes (Addendum)
 Pharmacy Antibiotic Note  Lee Bridges is a 33 y.o. male admitted on 07/28/2023 with blurry vision and bilateral  lower extremity tingling/numbness.  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 PMH includes HIV/AIDs (Poor adherence with Biktarvy and Bactrim DS) and substance abuse.   -Diagnosed with syphilis 03/2023 with titer 1:32 (did not receive treatment) -LP: glucose 63, protein 21, WBC 12 -CSF gram stain showing no organisms -Cryptococcal antigen negative   Plan: -Start Penicillin 12 million units IV every 12 hours for 10-14 days -Fluconazole 200mg  IV q24h started for oropharyngeal candidiasis (transition to PO when able) -F/u meningitis biofire, CSF/fungal culture from LP -F/u ID consult in AM    Temp (24hrs), Avg:98.1 F (36.7 C), Min:97.8 F (36.6 C), Max:98.5 F (36.9 C)  Recent Labs  Lab 07/28/23 1140  WBC 3.2*  CREATININE 0.96    Estimated Creatinine Clearance: 85.8 mL/min (by C-G formula based on SCr of 0.96 mg/dL).    Allergies  Allergen Reactions   Fish Allergy Hives and Nausea And Vomiting   Shellfish Allergy Hives and Nausea And Vomiting    Antimicrobials this admission: Penicillin 2/28 >>   Microbiology results: 2/28 CSF Cx:  2/28 Fungal Cx:    Thank you for allowing pharmacy to be a part of this patient's care.  Arabella Merles, PharmD. Clinical Pharmacist 07/29/2023 1:02 AM

## 2023-07-29 NOTE — Progress Notes (Signed)
 Patient transfer from [facility] ED to 4NP-12. A&O X4 with 8/10 lower back pain d/t recent CSF culture. Patient at rest ordering food. Personal belongings with patient. VSS and assessment completed. Cardiac tele applied, call bell within reach, and bed alarm on.

## 2023-07-29 NOTE — ED Notes (Signed)
 Patient resting in bed with eyes closed, no s/s of distress, will continue to monitor.

## 2023-07-29 NOTE — ED Notes (Signed)
 Lumbar Puncture ended.

## 2023-07-29 NOTE — Progress Notes (Signed)
Patient off unit for chest XR

## 2023-07-29 NOTE — ED Notes (Addendum)
 Lumbar Puncture start. Patient placed on 2L oxygen Lee Bridges.

## 2023-07-29 NOTE — Consult Note (Signed)
 [facility]    Date of Admission:  07/28/2023     Reason for Consult: neurosyphilis    Referring Provider: Jarvis Newcomer      Abx: 2/27-c penicillin g          Assessment: 33 yo male aids (msm; noncompliant with biktarvy), hx syphilis, recently though with increased rpr titer and 2-3 months of geographic numbness/tingling, vision blurriness, cough, headache, nausea, diarrhea, lightheadedness, worsened over the last 2 weeks prior to admission, admitted 07/28/23 for planned neurosyphilis tx and workup   #hiv Sees [facility] Lab Results  Component Value Date   CD4TCELL 15 (L) 03/11/2023   CD4TABS 205 (L) 03/11/2023   Lab Results  Component Value Date   HIV1RNAQUANT 10,700 03/11/2023   Reports last took 1 month biktarvy about 4-5 months ago; never has sustained years of biktarvy  Was supposed to be on bactrim prophy  Discuss with him improtance of taking biktarvy every day  For now pending workup for OI, will hold   #syphilis Presumed neuro Rpr titer 07/2023 256 Sx concerning for neuroyphilis Need iv pcn   #OI Diarrhea -- crypto vs prolonged viral shedding of commong organisms such as noro vs salmonella or ecoli process or giardia Cough -- ddx pjp vs chronic common viral process N/v/headache -- gi primary process due to diarrhea or crypto Blurriness -- cmv vs sypilis vs tb or other endemic fungal disease   #oral lesion Appears to be papilloma not thrush   #left axillary rash; well demarcated almost sign of old contact dermatitis from deodorant. Could be tinea but no itch Can observe    Plan: Awaiting LP -- added jc virus and ebv pcr along with basic csf analysis/me panel biofire Rpr titer Csf crypto ag Will add histo urine ag, gi pcr Depending on chest xray will see if need to workup for pjp pna Will get chest xray  Have discussed with primary team to get ophthalmology to do dilated eye exam Penicillin g for presumed  neurosyphilis No evidence thrush and will stop fluconazole Hold biktarvy and bactrim prophy for now     ------------------------------------------------ Principal Problem:   History of AIDS (HCC) Active Problems:   History of syphilis   Generalized anxiety disorder   Oral candidiasis   Neurosyphilis    HPI: Lee Bridges is a 33 y.o. male aids (msm; noncompliant with biktarvy), hx syphilis, recently though with increased rpr titer and 2-3 months of geographic numbness/tingling, vision blurriness, cough, headache, nausea, diarrhea, lightheadedness, worsened over the last 2 weeks prior to admission, admitted 07/28/23 for planned neurosyphilis tx and workup  Patient noncompliant Last cd4 03/2023 205, not taking biktarvy for past 4-5 months  2-3 months with sx above Poor appetite as he would have severe nausea with smell of food  Due to progression of sx so severe last 2 weeks, he came to [facility] and was directly walked over to ed for admission and w/u neurosyphilis   Has left axillary nonitchy rash for several months  No other rash  No other complaint  Hx syphilis prior treatment in 2022, but then increased titer since 03/2023 not yet retreated  Lp done Mri brain no obvious acute path    Family History  Problem Relation Age of Onset   Arthritis Mother    Diabetes Father    Hypertension Father     Social History   Tobacco Use   Smoking status: Every Day    Current packs/day:  0.50    Types: Cigarettes   Smokeless tobacco: Never   Tobacco comments:    1 PPD  Substance Use Topics   Alcohol use: Not Currently   Drug use: Yes    Types: Cocaine, Marijuana    Comment: socially    Allergies  Allergen Reactions   Fish Allergy Hives and Nausea And Vomiting   Shellfish Allergy Hives and Nausea And Vomiting    Review of Systems: ROS All Other ROS was negative, except mentioned above   Past Medical History:  Diagnosis Date   HIV (human immunodeficiency virus  infection) (HCC)        Scheduled Meds:  sodium chloride flush  3 mL Intravenous Q12H   terbinafine   Topical BID   Continuous Infusions:  sodium chloride     fluconazole (DIFLUCAN) IV Stopped (07/29/23 0354)   penicillin G potassium 12 Million Units in dextrose 5 % 500 mL CONTINUOUS infusion 12 Million Units (07/29/23 0203)   PRN Meds:.sodium chloride, acetaminophen **OR** acetaminophen, magic mouthwash w/lidocaine, ondansetron **OR** ondansetron (ZOFRAN) IV, sodium chloride flush   OBJECTIVE: Blood pressure 124/66, pulse 89, temperature 98.4 F (36.9 C), temperature source Oral, resp. rate 16, height 5\' 2"  (1.575 m), weight 54.8 kg, SpO2 98%.  Physical Exam  General/constitutional: no distress, pleasant HEENT: Normocephalic, PER, Conj Clear, EOMI, Oropharynx clear; upper posterior pallate with verucious papils; no thrush Neck supple CV: rrr no mrg Lungs: clear to auscultation, normal respiratory effort Abd: Soft, Nontender Ext: no edema Skin: see pic Neuro: nonfocal MSK: no peripheral joint swelling/tenderness/warmth; back spines nontender         Lab Results Lab Results  Component Value Date   WBC 3.0 (L) 07/29/2023   HGB 12.1 (L) 07/29/2023   HCT 36.3 (L) 07/29/2023   MCV 103.4 (H) 07/29/2023   PLT 251 07/29/2023    Lab Results  Component Value Date   CREATININE 1.02 07/29/2023   BUN 12 07/29/2023   NA 140 07/29/2023   K 3.3 (L) 07/29/2023   CL 106 07/29/2023   CO2 24 07/29/2023    Lab Results  Component Value Date   ALT 10 07/29/2023   AST 18 07/29/2023   ALKPHOS 50 07/29/2023   BILITOT 0.5 07/29/2023      Microbiology: Recent Results (from the past 240 hours)  Resp panel by RT-PCR (RSV, Flu A&B, Covid) Anterior Nasal Swab     Status: None   Collection Time: 07/28/23 11:40 AM   Specimen: Anterior Nasal Swab  Result Value Ref Range Status   SARS Coronavirus 2 by RT PCR NEGATIVE NEGATIVE Final   Influenza A by PCR NEGATIVE NEGATIVE Final    Influenza B by PCR NEGATIVE NEGATIVE Final    Comment: (NOTE) The Xpert Xpress SARS-CoV-2/FLU/RSV plus assay is intended as an aid in the diagnosis of influenza from Nasopharyngeal swab specimens and should not be used as a sole basis for treatment. Nasal washings and aspirates are unacceptable for Xpert Xpress SARS-CoV-2/FLU/RSV testing.  Fact Sheet for Patients: BloggerCourse.com  Fact Sheet for Healthcare Providers: SeriousBroker.it  This test is not yet approved or cleared by the [place] FDA and has been authorized for detection and/or diagnosis of SARS-CoV-2 by FDA under an Emergency Use Authorization (EUA). This EUA will remain in effect (meaning this test can be used) for the duration of the COVID-19 declaration under Section 564(b)(1) of the Act, 21 U.S.C. section 360bbb-3(b)(1), unless the authorization is terminated or revoked.     Resp Syncytial Virus by  PCR NEGATIVE NEGATIVE Final    Comment: (NOTE) Fact Sheet for Patients: BloggerCourse.com  Fact Sheet for Healthcare Providers: SeriousBroker.it  This test is not yet approved or cleared by the [place] FDA and has been authorized for detection and/or diagnosis of SARS-CoV-2 by FDA under an Emergency Use Authorization (EUA). This EUA will remain in effect (meaning this test can be used) for the duration of the COVID-19 declaration under Section 564(b)(1) of the Act, 21 U.S.C. section 360bbb-3(b)(1), unless the authorization is terminated or revoked.  Performed at [facility], [address] [place], [place] [address]   CSF culture w Gram Stain     Status: None (Preliminary result)   Collection Time: 07/29/23 12:45 AM   Specimen: CSF; Cerebrospinal Fluid  Result Value Ref Range Status   Specimen Description CSF  Final   Special Requests Immunocompromised  Final   Gram Stain   Final     CYTOSPIN SMEAR WBC PRESENT, PREDOMINANTLY MONONUCLEAR NO ORGANISMS SEEN Performed at [facility], [address] [place], [place] [address]    Culture PENDING  Incomplete   Report Status PENDING  Incomplete     Serology:    Imaging: If present, new imagings (plain films, ct scans, and mri) have been personally visualized and interpreted; radiology reports have been reviewed. Decision making incorporated into the Impression / Recommendations.  2/28 brain mri 1. No evidence of an acute intracranial abnormality. 2. Tiny nonspecific chronic insult within the right frontal lobe white matter. Otherwise unremarkable MRI appearance of the brain. 3. Abnormal T1 hypointense marrow signal within visible portions of the cervical spine. While this finding can reflect a marrow infiltrative process, the most common causes include chronic anemia, smoking and obesity. 4. Paranasal sinus disease as described.      Raymondo Band, MD [facility] [phone] pager    07/29/2023, 1:55 PM

## 2023-07-29 NOTE — ED Notes (Signed)
 Patient currently in MRI.

## 2023-07-29 NOTE — H&P (Addendum)
 History and Physical    Lee Bridges ZOX:[id number] DOB: 12-Jun-1990 DOA: 07/28/2023  PCP: Blanchard Kelch, NP   Patient coming from: Home   Chief Complaint:  Chief Complaint  Patient presents with   Emesis   ED TRIAGE note:  HPI:  Lee Bridges is a 33 y.o. male with medical history significant of AIDS, generalized anxiety disorder, bipolar disorder, chronic cocaine use, history of syphilis, anal condyloma, major depressive disorder and generalized anxiety disorder referred to emergency department by infectious disease clinic for evaluation for neurosyphilis, oral and skin candidiasis.  Reported that he has blurry vision for 2 weeks, headache for 2 weeks and bilateral upper and lower extremities tingling and numbness around 1 month. Patient has been noncompliant with Biktarvy for last 2 to 3 months.  Patient denies any fever, chill, nausea, vomiting, change of appetite, change of weight, chest pain, shortness of breath, cough, palpitation, sore throat and diarrhea.  During my evaluation at the bedside patient is sleepy/drowsy as he was given Versed 2 mg before the lumbar puncture.  However he is alert oriented x 3.  At presentation to ED patient is hemodynamically stable.  Normal lipase level. CMP showing low potassium 3.4 CBC showing chronically low WBC count 3.2, stable H&H 12.8 and 38 normal platelet count 279. Respiratory panel negative. Pending lumbar puncture.  Hospitalist has been consulted for further evaluation management of concern oropharyngeal candidiasis and neurosyphilis.  Consulted and spoke with Dr. Renold Ashby over phone who recommended to start IV penicillin G continuous infusion and hold the Biktarvy.  Recommended to send CSF for for cell count, Gram stain, cultures, protein, glucose, VDRL CSF, check GC virus, cytomegalovirus EBV virus, cryptococcal antigen and meningitis/encephalitis panel.     Significant labs in the ED: Lab Orders         Resp panel by RT-PCR (RSV,  Flu A&B, Covid) Anterior Nasal Swab         CSF culture w Gram Stain         Fungus Culture With Stain         Lipase, blood         Comprehensive metabolic panel         CBC         Urinalysis, Routine w reflex microscopic -Urine, Clean Catch         T-helper cells (CD4) count (not at [facility])         RPR         Protein and glucose, CSF         Meningitis/Encephalitis Panel (CSF)         VDRL, CSF         JC virus, PRC CSF         Epstein-Barr virus VCA, IgM         CMV DNA, quantitative, PCR         Cryptococcal antigen, CSF         CSF cell count with differential collection tube #: 4         CSF cell count with differential collection tube #: 1         CBC         Comprehensive metabolic panel         HIV-1 RNA quant-no reflex-bld         Differential       Review of Systems:  Review of Systems  Constitutional:  Negative for chills, fever, malaise/fatigue and weight loss.  HENT:  Negative for congestion, ear discharge, sinus pain and sore throat.   Eyes:  Positive for blurred vision. Negative for double vision, photophobia, pain and discharge.  Respiratory:  Negative for cough, sputum production and shortness of breath.   Cardiovascular:  Negative for chest pain.  Gastrointestinal:  Negative for abdominal pain, constipation, diarrhea, heartburn, nausea and vomiting.  Genitourinary:  Positive for urgency. Negative for dysuria and frequency.  Musculoskeletal:  Negative for back pain, falls, joint pain and neck pain.  Neurological:  Positive for tingling and headaches. Negative for dizziness, tremors, sensory change, speech change, focal weakness and weakness.  Psychiatric/Behavioral:  The patient is not nervous/anxious.     Past Medical History:  Diagnosis Date   HIV (human immunodeficiency virus infection) (HCC)     History reviewed. No pertinent surgical history.   reports that he has been smoking cigarettes. He has never used smokeless tobacco. He reports that he does  not currently use alcohol. He reports current drug use. Drugs: Cocaine and Marijuana.  Allergies  Allergen Reactions   Fish Allergy Hives and Nausea And Vomiting   Shellfish Allergy Hives and Nausea And Vomiting    Family History  Problem Relation Age of Onset   Arthritis Mother    Diabetes Father    Hypertension Father     Prior to Admission medications   Medication Sig Start Date End Date Taking? Authorizing Provider  bictegravir-emtricitabine-tenofovir AF (BIKTARVY) 50-200-25 MG TABS tablet Take 1 tablet by mouth daily. Patient not taking: Reported on 07/28/2023 12/23/22   Blanchard Kelch, NP     Physical Exam: Vitals:   07/29/23 0215 07/29/23 0230 07/29/23 0245 07/29/23 0500  BP: 109/72 116/64 130/71 122/80  Pulse: 70 68 78 71  Resp: 16 16 17 17   Temp:    98.5 F (36.9 C)  TempSrc:      SpO2: 97% 99% 99% 99%    Physical Exam Constitutional:      Appearance: He is not ill-appearing.     Comments: Sleepy/drowsy  HENT:     Mouth/Throat:     Mouth: Mucous membranes are moist.     Pharynx: Oropharyngeal exudate present. No posterior oropharyngeal erythema.     Comments: Whitish oropharyngeal exudate Eyes:     Conjunctiva/sclera: Conjunctivae normal.  Cardiovascular:     Rate and Rhythm: Normal rate and regular rhythm.     Pulses: Normal pulses.     Heart sounds: Normal heart sounds.  Pulmonary:     Effort: Pulmonary effort is normal.     Breath sounds: Normal breath sounds.  Abdominal:     General: Bowel sounds are normal.  Musculoskeletal:     Cervical back: Neck supple.  Skin:    General: Skin is warm and dry.     Capillary Refill: Capillary refill takes less than 2 seconds.  Neurological:     Mental Status: He is oriented to person, place, and time.     Cranial Nerves: No cranial nerve deficit.     Sensory: No sensory deficit.     Motor: No weakness.     Coordination: Coordination normal.  Psychiatric:        Mood and Affect: Mood normal.         Thought Content: Thought content normal.      Labs on Admission: I have personally reviewed following labs and imaging studies  CBC: Recent Labs  Lab 07/28/23 1140 07/29/23 0238  WBC 3.2* 3.0*  NEUTROABS  --  1.4*  HGB 12.8* 12.1*  HCT 38.8* 36.3*  MCV 104.0* 103.4*  PLT 279 251   Basic Metabolic Panel: Recent Labs  Lab 07/28/23 1140 07/29/23 0238  NA 136 140  K 3.4* 3.3*  CL 105 106  CO2 21* 24  GLUCOSE 96 115*  BUN 13 12  CREATININE 0.96 1.02  CALCIUM 9.1 8.6*   GFR: Estimated Creatinine Clearance: 80.7 mL/min (by C-G formula based on SCr of 1.02 mg/dL). Liver Function Tests: Recent Labs  Lab 07/28/23 1140 07/29/23 0238  AST 20 18  ALT 11 10  ALKPHOS 50 50  BILITOT 0.6 0.5  PROT 7.8 7.1  ALBUMIN 3.7 3.0*   Recent Labs  Lab 07/28/23 1140  LIPASE 33   No results for input(s): "AMMONIA" in the last 168 hours. Coagulation Profile: No results for input(s): "INR", "PROTIME" in the last 168 hours. Cardiac Enzymes: No results for input(s): "CKTOTAL", "CKMB", "CKMBINDEX", "TROPONINI", "TROPONINIHS" in the last 168 hours. BNP (last 3 results) No results for input(s): "BNP" in the last 8760 hours. HbA1C: No results for input(s): "HGBA1C" in the last 72 hours. CBG: No results for input(s): "GLUCAP" in the last 168 hours. Lipid Profile: No results for input(s): "CHOL", "HDL", "LDLCALC", "TRIG", "CHOLHDL", "LDLDIRECT" in the last 72 hours. Thyroid Function Tests: No results for input(s): "TSH", "T4TOTAL", "FREET4", "T3FREE", "THYROIDAB" in the last 72 hours. Anemia Panel: No results for input(s): "VITAMINB12", "FOLATE", "FERRITIN", "TIBC", "IRON", "RETICCTPCT" in the last 72 hours. Urine analysis:    Component Value Date/Time   COLORURINE YELLOW 07/28/2023 1142   APPEARANCEUR CLEAR 07/28/2023 1142   LABSPEC 1.029 07/28/2023 1142   PHURINE 5.0 07/28/2023 1142   GLUCOSEU NEGATIVE 07/28/2023 1142   HGBUR NEGATIVE 07/28/2023 1142   BILIRUBINUR NEGATIVE  07/28/2023 1142   KETONESUR NEGATIVE 07/28/2023 1142   PROTEINUR 30 (A) 07/28/2023 1142   NITRITE NEGATIVE 07/28/2023 1142   LEUKOCYTESUR NEGATIVE 07/28/2023 1142    Radiological Exams on Admission: I have personally reviewed images CT Head Wo Contrast Result Date: 07/28/2023 CLINICAL DATA:  Headache, vision changes, and fevers in a patient with AIDS. EXAM: CT HEAD WITHOUT CONTRAST TECHNIQUE: Contiguous axial images were obtained from the base of the skull through the vertex without intravenous contrast. RADIATION DOSE REDUCTION: This exam was performed according to the departmental dose-optimization program which includes automated exposure control, adjustment of the mA and/or kV according to patient size and/or use of iterative reconstruction technique. COMPARISON:  None Available. FINDINGS: Brain: No evidence of acute infarction, hemorrhage, hydrocephalus, extra-axial collection or mass lesion/mass effect. No mass or abscess identified. Vascular: No hyperdense vessel or unexpected calcification. Skull: Normal. Negative for fracture or focal lesion. Sinuses/Orbits: Paranasal sinuses and mastoid air cells are clear. Other: None. IMPRESSION: No acute intracranial abnormalities. Electronically Signed   By: Burman Nieves M.D.   On: 07/28/2023 22:25      Assessment/Plan: Principal Problem:   History of AIDS (HCC) Active Problems:   Oral candidiasis   Neurosyphilis   History of syphilis   Generalized anxiety disorder    Assessment and Plan: Neurosyphilis > Patient presenting with blurry vision for last couple of weeks and bilateral upper lower extremity tingling and numbness for last couple of months.  History of condyloma acuminata in the past and AIDS noncompliant with Biktarvy.  He has been referred to emergency department from the infusion disease clinic. -Consulted and spoke with Dr. Renold Nahzir over phone who recommended to start IV penicillin G continuous infusion and hold the Biktarvy.   Recommended to send CSF for for  cell count, Gram stain, cultures, protein, glucose, VDRL CSF, check GC virus, cytomegalovirus EBV virus, cryptococcal antigen and meningitis/encephalitis panel.   -ED physician Dr. Molly Maduro unable to obtain the lumbar puncture. - Starting IV penicillin G continuous infusion 12,000,000 units every 12 hours. - [employer]. - Will follow-up with CSF study. - ID has been consulted and will do formal consult in the daytime.  Appreciate ID input.   Oral candidiasis -Starting IV fluconazole 200 mg daily.  History of HIV -CD4 cell count 205 and HIV 1 RNA viral load 10,107 in October 2024. -Rechecking CD4 cell count and HIV viral load. - Patient is noncompliant with Biktarvy. - ID Dr. Renold Williams recommended to hold the [facility] as of now and will evaluate in the daytime before reinitiating.  Hypokalemia - Repeating with oral KCl.   DVT prophylaxis:  SCDs.  Holding pharmacologic DVT prophylaxis for next 24 hours given patient just have a lumbar puncture procedure. Code Status:  Full Code Diet: Heart healthy diet Family Communication:   None present Disposition Plan: Pending CSF study and formal ID consult. Consults: Infectious disease Admission status:   Inpatient, Step Down Unit  Severity of Illness: The appropriate patient status for this patient is INPATIENT. Inpatient status is judged to be reasonable and necessary in order to provide the required intensity of service to ensure the patient's safety. The patient's presenting symptoms, physical exam findings, and initial radiographic and laboratory data in the context of their chronic comorbidities is felt to place them at high risk for further clinical deterioration. Furthermore, it is not anticipated that the patient will be medically stable for discharge from the hospital within 2 midnights of admission.   * I certify that at the point of admission it is my clinical judgment that the patient will require  inpatient hospital care spanning beyond 2 midnights from the point of admission due to high intensity of service, high risk for further deterioration and high frequency of surveillance required.Marland Kitchen    Tereasa Coop, MD [facility]  How to contact the [facility] Attending or Consulting provider 7A - 7P or covering provider during after hours 7P -7A, for this patient.  Check the care team in [facility] and look for a) attending/consulting [facility] provider listed and b) the [facility] team listed Log into [email or web address] and use [place]'s universal password to access. If you do not have the password, please contact the hospital operator. Locate the [facility] provider you are looking for under [facility] and page to a number that you can be directly reached. If you still have difficulty reaching the provider, please page the [facility] (Director on Call) for the Hospitalists listed on amion for assistance.  07/29/2023, 5:11 AM

## 2023-07-30 DIAGNOSIS — B20 Human immunodeficiency virus [HIV] disease: Secondary | ICD-10-CM | POA: Diagnosis not present

## 2023-07-30 DIAGNOSIS — A523 Neurosyphilis, unspecified: Secondary | ICD-10-CM | POA: Diagnosis not present

## 2023-07-30 LAB — CMV DNA, QUANTITATIVE, PCR
CMV DNA Quant: NEGATIVE [IU]/mL
Log10 CMV Qn DNA Pl: UNDETERMINED {Log_IU}/mL

## 2023-07-30 LAB — HIV-1 RNA QUANT-NO REFLEX-BLD
HIV 1 RNA Quant: 196000 {copies}/mL
LOG10 HIV-1 RNA: 5.292 {Log_copies}/mL

## 2023-07-30 LAB — EPSTEIN BARR VRS(EBV DNA BY PCR): EBV DNA QN by PCR: NEGATIVE [IU]/mL

## 2023-07-30 LAB — EPSTEIN-BARR VIRUS VCA, IGM: EBV VCA IgM: <36 U/mL (ref 0.0–35.9)

## 2023-07-30 MED ORDER — SULFAMETHOXAZOLE-TRIMETHOPRIM 400-80 MG PO TABS
1.0000 | ORAL_TABLET | Freq: Every day | ORAL | Status: DC
  Administered 2023-07-30 – 2023-08-03 (×5): 1 via ORAL
  Filled 2023-07-30 (×5): qty 1

## 2023-07-30 MED ORDER — BICTEGRAVIR-EMTRICITAB-TENOFOV 50-200-25 MG PO TABS
1.0000 | ORAL_TABLET | Freq: Every day | ORAL | Status: DC
  Administered 2023-07-30 – 2023-08-03 (×5): 1 via ORAL
  Filled 2023-07-30 (×5): qty 1

## 2023-07-30 MED ORDER — SENNOSIDES-DOCUSATE SODIUM 8.6-50 MG PO TABS
1.0000 | ORAL_TABLET | Freq: Every day | ORAL | Status: DC
  Administered 2023-07-30 – 2023-08-01 (×3): 1 via ORAL
  Filled 2023-07-30 (×4): qty 1

## 2023-07-30 MED ORDER — METHOCARBAMOL 500 MG PO TABS
500.0000 mg | ORAL_TABLET | Freq: Four times a day (QID) | ORAL | Status: DC | PRN
  Administered 2023-07-30 – 2023-08-02 (×6): 500 mg via ORAL
  Filled 2023-07-30 (×6): qty 1

## 2023-07-30 NOTE — Progress Notes (Signed)
 Patient endorses SI. Reports HIV status, lack of resources/housing, and feeling no one cares for him as main stressors. Patient states, " Im tired of this, I'm stressed out, I'm done" Reports he stopped taking HIV medication on purpose.   Lee Newcomer, MD made aware.   SI precautions ordered.

## 2023-07-30 NOTE — Progress Notes (Signed)
 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; noncompliant with biktarvy), hx syphilis, recently though with increased rpr titer and 2-3 months of geographic numbness/tingling, vision blurriness, cough, headache, nausea, diarrhea, lightheadedness, worsened over the last 2 weeks prior to admission, admitted 07/28/23 for planned neurosyphilis tx and workup     #hiv Sees rcid 2/28 cd4 171 (14%)   Recent Labs       Lab Results  Component Value Date    HIV1RNAQUANT 10,700 03/11/2023      Reports last took 1 month biktarvy about 4-5 months ago; never has sustained years of biktarvy   Was supposed to be on bactrim prophy   Discuss with him improtance of taking biktarvy every day   Csf crypto negative. No concern for other OI in the cns that would preclude early reinitiation of ART     #syphilis Presumed neuro Rpr titer 07/2023 256 2/28 csf 12 wbc lymph predominant; normal glucose. Lots of rbc Given aids and high rpr titer and nonspecific cns sx, will treat presumptively as neurosyphilis with 2 weeks pen g  Will need hh teaching comes Monday and assess for opat candidacy    #OI Diarrhea -- crypto vs prolonged viral shedding of commong organisms such as noro vs salmonella or ecoli process or giardia. He hasn't had stool here  Cough -- ddx pjp vs chronic common viral process. CXR is clear ?post nasal vs reflux N/v/headache -- gi primary process due to diarrhea or crypto. So far syphilis is most obvious. Me pcr negative; jc virus in process but unlikely. Mri unremarkable. Crypto Ag negative Blurriness -- cmv vs sypilis vs tb or other endemic fungal disease. Eye exam done by ophthalmology here unremarkable   #substance use He uses pot chronically and uds here showed cocaine, thc, and benzo positive Query if nausea  due to chronic thc use     #oral lesion Appears to be papilloma not thrush     #left axillary rash; well demarcated almost sign of old contact dermatitis from deodorant. Could be tinea but no itch Can observe       Plan: -continue pcn g for presumed neurosyphilis -if tomorrow no diarrhea still will cancel gi pcr testing -HH to evaluate for opat candidacy Monday and ok to discharge from id standpoint of no clinical decline -will restart biktaryv and bactrim prophy -advise stop using pot -so far no other oi found -universal standard isolation precaution -discussed with primary team    Principal Problem:   History of AIDS (HCC) Active Problems:   History of syphilis   Generalized anxiety disorder   Oral candidiasis   Neurosyphilis   Allergies  Allergen Reactions   Fish Allergy Hives and Nausea And Vomiting   Shellfish Allergy Hives and Nausea And Vomiting    Scheduled Meds:  feeding supplement  237 mL Oral BID BM   senna-docusate  1 tablet Oral Daily   sodium chloride flush  3 mL Intravenous Q12H   terbinafine  Topical BID   Continuous Infusions:  penicillin G potassium 12 Million Units in dextrose 5 % 500 mL CONTINUOUS infusion 12 Million Units (07/30/23 0200)   PRN Meds:.acetaminophen **OR** acetaminophen, magic mouthwash w/lidocaine, methocarbamol, ondansetron **OR** ondansetron (ZOFRAN) IV, sodium chloride flush   SUBJECTIVE: Feels a little better with nausea, headache not bad Mri brain and lp findings discussed  No f/c  Review of Systems: ROS All other ROS was negative, except mentioned above     OBJECTIVE: Vitals:   07/29/23 1344 07/29/23 2300 07/30/23 0328 07/30/23 0821  BP: 124/66  101/70 117/77  Pulse: 89  66 86  Resp: 16  17 16   Temp: 98.4 F (36.9 C) 98.9 F (37.2 C) 98.6 F (37 C) 97.9 F (36.6 C)  TempSrc: Oral Oral Oral Oral  SpO2: 98%  99% 95%  Weight: 54.8 kg     Height: 5\' 2"  (1.575 m)      Body mass index is 22.1  kg/m.  Physical Exam General/constitutional: no distress, pleasant HEENT: Normocephalic, PER, Conj Clear, EOMI, Oropharynx clear Neck supple CV: rrr no mrg Lungs: clear to auscultation, normal respiratory effort Abd: Soft, Nontender Ext: no edema Skin: stable axillary well demarcated squamous rash Neuro: nonfocal MSK: no peripheral joint swelling/tenderness/warmth; back spines nontender   Lab Results Lab Results  Component Value Date   WBC 3.0 (L) 07/29/2023   HGB 12.1 (L) 07/29/2023   HCT 36.3 (L) 07/29/2023   MCV 103.4 (H) 07/29/2023   PLT 251 07/29/2023    Lab Results  Component Value Date   CREATININE 1.02 07/29/2023   BUN 12 07/29/2023   NA 140 07/29/2023   K 3.3 (L) 07/29/2023   CL 106 07/29/2023   CO2 24 07/29/2023    Lab Results  Component Value Date   ALT 10 07/29/2023   AST 18 07/29/2023   ALKPHOS 50 07/29/2023   BILITOT 0.5 07/29/2023      Microbiology: Recent Results (from the past 240 hours)  Resp panel by RT-PCR (RSV, Flu A&B, Covid) Anterior Nasal Swab     Status: None   Collection Time: 07/28/23 11:40 AM   Specimen: Anterior Nasal Swab  Result Value Ref Range Status   SARS Coronavirus 2 by RT PCR NEGATIVE NEGATIVE Final   Influenza A by PCR NEGATIVE NEGATIVE Final   Influenza B by PCR NEGATIVE NEGATIVE Final    Comment: (NOTE) The Xpert Xpress SARS-CoV-2/FLU/RSV plus assay is intended as an aid in the diagnosis of influenza from Nasopharyngeal swab specimens and should not be used as a sole basis for treatment. Nasal washings and aspirates are unacceptable for Xpert Xpress SARS-CoV-2/FLU/RSV testing.  Fact Sheet for Patients: BloggerCourse.com  Fact Sheet for Healthcare Providers: SeriousBroker.it  This test is not yet approved or cleared by the [place] FDA and has been authorized for detection and/or diagnosis of SARS-CoV-2 by FDA under an Emergency Use Authorization (EUA). This  EUA will remain in effect (meaning this test can be used) for the duration of the COVID-19 declaration under Section 564(b)(1) of the Act, 21 U.S.C. section 360bbb-3(b)(1), unless the authorization is terminated or revoked.     Resp Syncytial Virus by PCR NEGATIVE NEGATIVE Final    Comment: (NOTE) Fact Sheet for Patients: BloggerCourse.com  Fact Sheet for Healthcare Providers: SeriousBroker.it  This test is not yet approved or cleared by the [place] FDA and has been authorized for detection and/or diagnosis of SARS-CoV-2 by FDA under an Emergency Use Authorization (EUA). This EUA will remain in  effect (meaning this test can be used) for the duration of the COVID-19 declaration under Section 564(b)(1) of the Act, 21 U.S.C. section 360bbb-3(b)(1), unless the authorization is terminated or revoked.  Performed at [facility], [address] [place], [place] [address]   CSF culture w Gram Stain     Status: None (Preliminary result)   Collection Time: 07/29/23 12:45 AM   Specimen: CSF; Cerebrospinal Fluid  Result Value Ref Range Status   Specimen Description CSF  Final   Special Requests Immunocompromised  Final   Gram Stain   Final    CYTOSPIN SMEAR WBC PRESENT, PREDOMINANTLY MONONUCLEAR NO ORGANISMS SEEN Performed at [facility], [address], [place], [place] [address]    Culture PENDING  Incomplete   Report Status PENDING  Incomplete     Serology:   Imaging: If present, new imagings (plain films, ct scans, and mri) have been personally visualized and interpreted; radiology reports have been reviewed. Decision making incorporated into the Impression / Recommendations.  2/28 mri brain 1. No evidence of an acute intracranial abnormality. 2. Tiny nonspecific chronic insult within the right frontal lobe white matter. Otherwise unremarkable MRI appearance of the brain. 3. Abnormal T1 hypointense marrow  signal within visible portions of the cervical spine. While this finding can reflect a marrow infiltrative process, the most common causes include chronic anemia, smoking and obesity. 4. Paranasal sinus disease as described.   Raymondo Band, MD [facility] [phone] pager    07/30/2023, 11:11 AM

## 2023-07-30 NOTE — Plan of Care (Signed)
  Problem: Education: Goal: Knowledge of General Education information will improve Description: Including pain rating scale, medication(s)/side effects and non-pharmacologic comfort measures 07/30/2023 0018 by Carlene Coria, RN Outcome: Progressing 07/29/2023 2359 by Carlene Coria, RN Outcome: Progressing   Problem: Health Behavior/Discharge Planning: Goal: Ability to manage health-related needs will improve 07/30/2023 0018 by Carlene Coria, RN Outcome: Progressing 07/29/2023 2359 by Carlene Coria, RN Outcome: Progressing   Problem: Clinical Measurements: Goal: Ability to maintain clinical measurements within normal limits will improve 07/30/2023 0018 by Carlene Coria, RN Outcome: Progressing 07/29/2023 2359 by Carlene Coria, RN Outcome: Progressing Goal: Will remain free from infection 07/30/2023 0018 by Carlene Coria, RN Outcome: Progressing 07/29/2023 2359 by Carlene Coria, RN Outcome: Progressing Goal: Diagnostic test results will improve 07/30/2023 0018 by Carlene Coria, RN Outcome: Progressing 07/29/2023 2359 by Carlene Coria, RN Outcome: Progressing Goal: Respiratory complications will improve 07/30/2023 0018 by Carlene Coria, RN Outcome: Progressing 07/29/2023 2359 by Carlene Coria, RN Outcome: Progressing Goal: Cardiovascular complication will be avoided 07/30/2023 0018 by Carlene Coria, RN Outcome: Progressing 07/29/2023 2359 by Carlene Coria, RN Outcome: Progressing   Problem: Activity: Goal: Risk for activity intolerance will decrease 07/30/2023 0018 by Carlene Coria, RN Outcome: Progressing 07/29/2023 2359 by Carlene Coria, RN Outcome: Progressing   Problem: Nutrition: Goal: Adequate nutrition will be maintained 07/30/2023 0018 by Carlene Coria, RN Outcome: Progressing 07/29/2023 2359 by Carlene Coria, RN Outcome: Progressing   Problem: Coping: Goal: Level of anxiety will  decrease 07/30/2023 0018 by Carlene Coria, RN Outcome: Progressing 07/29/2023 2359 by Carlene Coria, RN Outcome: Progressing   Problem: Elimination: Goal: Will not experience complications related to bowel motility 07/30/2023 0018 by Carlene Coria, RN Outcome: Progressing 07/29/2023 2359 by Carlene Coria, RN Outcome: Progressing Goal: Will not experience complications related to urinary retention 07/30/2023 0018 by Carlene Coria, RN Outcome: Progressing 07/29/2023 2359 by Carlene Coria, RN Outcome: Progressing   Problem: Pain Managment: Goal: General experience of comfort will improve and/or be controlled 07/30/2023 0018 by Carlene Coria, RN Outcome: Progressing 07/29/2023 2359 by Carlene Coria, RN Outcome: Progressing   Problem: Safety: Goal: Ability to remain free from injury will improve 07/30/2023 0018 by Carlene Coria, RN Outcome: Progressing 07/29/2023 2359 by Carlene Coria, RN Outcome: Progressing   Problem: Skin Integrity: Goal: Risk for impaired skin integrity will decrease 07/30/2023 0018 by Carlene Coria, RN Outcome: Progressing 07/29/2023 2359 by Carlene Coria, RN Outcome: Progressing

## 2023-07-30 NOTE — Progress Notes (Signed)
 [facility] PROGRESS NOTE  Keyon Liller (DOB: 1990-09-12) ZOX:[id number] PCP: Blanchard Kelch, NP  Brief Narrative: Azir Muzyka is a 33 y.o. male with a history of HIV, substance use, bipolar disorder who presented to the ED on 07/28/2023 who was walked to the ED from [facility] for work up of suspected AIDS, neurosyphilis.  Subjective: Low back pain improved incompletely with tylenol, limiting his getting OOB. Vision changes improved, had significant impairment after dilation yesterday now resolved. Also painful at R forearm IV sites. No BM in 4 days.   Objective: BP 117/77 (BP Location: Left Arm)   Pulse 86   Temp 97.9 F (36.6 C) (Oral)   Resp 16   Ht 5\' 2"  (1.575 m)   Wt 54.8 kg   SpO2 95%   BMI 22.10 kg/m   Gen: No distress, resting quietly HEENT: PERRL, conjunctivae clear Neck: No nuchal rigidity Pulm: Clear, nonlabored  CV: RRR GI: Hypoactive BS, soft, NT, ND Neuro: Alert, oriented, nonfocal Ext: WWP. Paraspinal spasm noted without midline tenderness/step offs.  Skin: Superficial L axilla stable fungal changes.   CSF: neg gram stain, RBCs 2k, WBC 12, lymphocytic, protein 21.  CT head: No acute abnormalities. ANC 1.4k.  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  - Continue PCN 62M units IV q12h x10-14 days - ID consult appreciated. MR brain nonacute, CXR negative - Follow up CSF studies including culture, JC virus, VDRL, fungal culture. Follow up CMV, EBV, GC/Chl probe. Urine histo Ag pending.   Oropharyngeal candidiasis:  - Continue magic mouthwash prn. ID DC'ed fluconazole.  HIV disease: Last CD4 in Oct 2024 was 205. CD4 rechecked now 170. VL was 10.7k.  - Stopped biktarvy, struggle with adherence documented. - Follow up repeat VL.   Tobacco use, marijuana use:  - Nicotine patch, cessation counseling  Cocaine use:  +UDS.  - Cessation counseling, monitor for withdrawal (none currently)  Hypokalemia:  - Supplemented, recheck in AM  Depression, anxiety, bipolar disorder: No home medications noted. Mood stable at this time.  [name], MD [facility] [email or web address] 07/30/2023, 8:57 AM

## 2023-07-31 DIAGNOSIS — A523 Neurosyphilis, unspecified: Secondary | ICD-10-CM | POA: Diagnosis not present

## 2023-07-31 DIAGNOSIS — B20 Human immunodeficiency virus [HIV] disease: Secondary | ICD-10-CM | POA: Diagnosis not present

## 2023-07-31 DIAGNOSIS — F332 Major depressive disorder, recurrent severe without psychotic features: Secondary | ICD-10-CM | POA: Diagnosis not present

## 2023-07-31 LAB — CBC WITH DIFFERENTIAL/PLATELET
Abs Immature Granulocytes: 0.05 10*3/uL (ref 0.00–0.07)
Basophils Absolute: 0 10*3/uL (ref 0.0–0.1)
Basophils Relative: 1 %
Eosinophils Absolute: 0.2 10*3/uL (ref 0.0–0.5)
Eosinophils Relative: 8 %
HCT: 37.1 % — ABNORMAL LOW (ref 39.0–52.0)
Hemoglobin: 12.5 g/dL — ABNORMAL LOW (ref 13.0–17.0)
Immature Granulocytes: 2 %
Lymphocytes Relative: 51 %
Lymphs Abs: 1.3 10*3/uL (ref 0.7–4.0)
MCH: 34.2 pg — ABNORMAL HIGH (ref 26.0–34.0)
MCHC: 33.7 g/dL (ref 30.0–36.0)
MCV: 101.4 fL — ABNORMAL HIGH (ref 80.0–100.0)
Monocytes Absolute: 0.4 10*3/uL (ref 0.1–1.0)
Monocytes Relative: 16 %
Neutro Abs: 0.6 10*3/uL — ABNORMAL LOW (ref 1.7–7.7)
Neutrophils Relative %: 22 %
Platelets: 254 10*3/uL (ref 150–400)
RBC: 3.66 MIL/uL — ABNORMAL LOW (ref 4.22–5.81)
RDW: 12 % (ref 11.5–15.5)
WBC: 2.5 10*3/uL — ABNORMAL LOW (ref 4.0–10.5)
nRBC: 0 % (ref 0.0–0.2)

## 2023-07-31 LAB — BASIC METABOLIC PANEL
Anion gap: 9 (ref 5–15)
BUN: 13 mg/dL (ref 6–20)
CO2: 22 mmol/L (ref 22–32)
Calcium: 8.9 mg/dL (ref 8.9–10.3)
Chloride: 105 mmol/L (ref 98–111)
Creatinine, Ser: 1.13 mg/dL (ref 0.61–1.24)
GFR, Estimated: >60 mL/min (ref >60)
Glucose, Bld: 106 mg/dL — ABNORMAL HIGH (ref 70–99)
Potassium: 4.1 mmol/L (ref 3.5–5.1)
Sodium: 136 mmol/L (ref 135–145)

## 2023-07-31 LAB — GC/CHLAMYDIA PROBE AMP (~~LOC~~) NOT AT ARMC
Chlamydia: NEGATIVE
Comment: NEGATIVE
Comment: NORMAL
Neisseria Gonorrhea: NEGATIVE

## 2023-07-31 LAB — MAGNESIUM: Magnesium: 1.7 mg/dL (ref 1.7–2.4)

## 2023-07-31 MED ORDER — QUETIAPINE FUMARATE 50 MG PO TABS
50.0000 mg | ORAL_TABLET | Freq: Every day | ORAL | Status: DC
  Administered 2023-07-31 – 2023-08-02 (×3): 50 mg via ORAL
  Filled 2023-07-31 (×3): qty 1

## 2023-07-31 MED ORDER — ENOXAPARIN SODIUM 40 MG/0.4ML IJ SOSY
40.0000 mg | PREFILLED_SYRINGE | Freq: Every day | INTRAMUSCULAR | Status: DC
  Administered 2023-07-31 – 2023-08-03 (×4): 40 mg via SUBCUTANEOUS
  Filled 2023-07-31 (×4): qty 0.4

## 2023-07-31 MED ORDER — MAGNESIUM SULFATE 2 GM/50ML IV SOLN
2.0000 g | Freq: Once | INTRAVENOUS | Status: AC
  Administered 2023-07-31: 2 g via INTRAVENOUS
  Filled 2023-07-31: qty 50

## 2023-07-31 NOTE — Consult Note (Addendum)
 [facility] Psychiatric Consult Initial  Patient Name: .Lee Bridges  MRN: [id number]  DOB: 03/19/1991  Consult Order details:  Orders (From admission, onward)     Start     Ordered   07/30/23 1840  IP CONSULT TO PSYCHIATRY       Ordering Provider: [name], MD  Provider:  (Not yet assigned)  Question Answer Comment  Location [facility]   Reason for Consult? 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  Stimulant use d/o  Assessment  Lee Bridges is a 33 y.o. male admitted: Medicallyfor 07/28/2023 10:47 AM for GI issues. He carries the psychiatric diagnoses of MDD and stimulant use d/o and has a past medical history of  HIV, syphilis, neurosyphilis.   The patient was initially drowsy in bed upon assessment. He recently stopped taking his HIV meds because "nobody cares about me" and he know longer wanted to live.  He reports experiencing "lots" of depression and continuation of suicidal ideations. When inquired about his suicidal ideation the patient reported his thoughts are predominately passive in nature with wanting to go to sleep and not wake up.  These are interspersed with active suicidal ideations of continuing to not take his HIV medication and using too much cocaine. One past suicide attempt with admission to [facility]. He also reports high levels of anxiety manifesting as excessive worry with increase in sweating. The client meets criteria for MDD as evidenced by a decrease in appetite with weight loss, anhedonia, low energy, poor motivation, and suicidal ideations. He reports taking 4g of cocaine daily. He started using cocaine at the age of 74 and became a problem at the age of 71, last year.  His UDS on 07/29/23  was positive for benzodiazepines, cocaine, and THC. His current outpatient psychotropic medications include Seroquel 50 mg and historically he has had a positive response to these medications. Patient reported going up on the dose once to 100mg  and it was too much. Trazodone in the past caused priapism.  He was none compliant with medications prior to admission as evidenced by self-report. On initial examination, patient was drowsy at first, cooperative. Please see plan below for detailed recommendations.   Diagnoses:  Active Hospital problems: Principal Problem:   History of AIDS (HCC) Active Problems:   Major depressive disorder, recurrent severe without psychotic features (HCC)   History of syphilis   Generalized anxiety disorder   Oral candidiasis   Neurosyphilis    Plan   ## Psychiatric Medication Recommendations:  Restart Seroquel 50 mg at bedtime Transfer to [facility] when medically clear  ## Medical Decision Making Capacity: Not specifically addressed in this encounter  ## Further Work-up:  EKG order placed -- most recent EKG on 03/14/23 had QtC of 405 -- Pertinent labwork reviewed earlier this admission includes: CBC, CMP, U/A, UDS   ## Disposition:-- We recommend inpatient psychiatric hospitalization when medically cleared. Patient is under voluntary admission status at this time; please IVC if attempts to leave hospital.  ## Behavioral / Environmental: -Utilize compassion and acknowledge the patient's experiences while setting clear and realistic expectations for care.    ## Safety and Observation Level:  - Based  on my clinical evaluation, I estimate the patient to be at low risk of self harm in the current setting. - At this time, we recommend  routine. This decision is based on my review of the chart including patient's history and current presentation, interview of the patient, mental status examination, and consideration of suicide risk including evaluating suicidal  ideation, plan, intent, suicidal or self-harm behaviors, risk factors, and protective factors. This judgment is based on our ability to directly address suicide risk, implement suicide prevention strategies, and develop a safety plan while the patient is in the clinical setting. Please contact our team if there is a concern that risk level has changed.  CSSR Risk Category:C-SSRS RISK CATEGORY: High Risk  Suicide Risk Assessment: Patient has following modifiable risk factors for suicide: active suicidal ideation, under treated depression , social isolation, recklessness, and medication noncompliance, which we are addressing by restarting his Seroquel 50mg . Patient has following non-modifiable or demographic risk factors for suicide: male gender, history of suicide attempt, and psychiatric hospitalization Patient has the following protective factors against suicide: Access to outpatient mental health care and no history of NSSIB  Thank you for this consult request. Recommendations have been communicated to the primary team.  We will continue to follow at this time.   Nanine Means, NP       History of Present Illness  Relevant Aspects of [facility] Course:  Admitted on 07/28/2023 for GI issues. They are suicidal and stopped taking his HIV medications because he does not want to live, related to stressors.   Patient Report:  The patient reports stopping his HIV medications because "nobody cares about me". He reports experiencing moderate levels of depression and suicidal ideation. The patient endorses high levels of anxiety and excessive sweating as a result. He reports a history of one suicide attempt and a subsequent 2 week psychiatric hospitalization. Per the patient, the psychiatric facility set up a transfer to a rehab facility, but that he didn't go due to not being allowed to get his belongings from the hotel he had been staying at.  He reports he started using cocaine at 33 years old,  but that he did not feel like it was a problem until this year (ex. skipped work to pick up cocaine). He reports using 4g of cocaine daily and denies any other drug use or alcohol consumption. The patient expressed interest in going to a drug rehabilitation clinic upon discharge.  Psych ROS:  Depression: high Anxiety:  high Mania (lifetime and current): none without being under the influence of cocaine Psychosis: (lifetime and current): not without cocaine use  Review of Systems  Constitutional: Negative.   HENT: Negative.    Eyes: Negative.   Respiratory: Negative.    Cardiovascular: Negative.   Gastrointestinal: Negative.   Genitourinary: Negative.   Musculoskeletal: Negative.   Skin: Negative.   Neurological: Negative.   Endo/Heme/Allergies: Negative.   Psychiatric/Behavioral:  Positive for depression, substance abuse and suicidal ideas. The patient is nervous/anxious.      Psychiatric and Social History  Psychiatric History:  Information collected from patient and chart.  Prev Dx/Sx: MDD, stimulant use d/o Current Psych Provider: [facility] Home Meds (current): Seroquel 50 mg at bedtime Previous Med Trials: Trazodone (priapism),  Therapy: past therapy  Prior Psych Hospitalization: [facility] at [facility] Prior Self Harm: once last year Prior Violence: none  Family Psych History: substance use d/o  Social History:  Living Situation: houseless  Access to weapons/lethal means: none   Substance  History Alcohol: denied  Tobacco: denies Illicit drugs: cocaine Prescription drug abuse: denied Rehab hx: none  Exam Findings  Physical Exam:  Vital Signs:  Temp:  [97.8 F (36.6 C)-98.4 F (36.9 C)] 97.8 F (36.6 C) (03/02 0820) Pulse Rate:  [72-103] 103 (03/02 0820) Resp:  [14-22] 22 (03/02 0820) BP: (112-121)/(60-94) 119/77 (03/02 0820) SpO2:  [96 %-99 %] 96 % (03/02 0820) Blood pressure 119/77, pulse (!) 103, temperature 97.8 F (36.6 C), temperature source Axillary, resp. rate  (!) 22, height 5\' 2"  (1.575 m), weight 54.8 kg, SpO2 96%. Body mass index is 22.1 kg/m.  Physical Exam  Mental Status Exam: General Appearance: Fairly Groomed  Orientation:  Full (Time, Place, and Person)  Memory:  Fair  Concentration:  Fair  Recall:  Fair  Attention  Fair  Eye Contact:  Fair  Speech:  Normal Rate  Language:  Good  Volume:  Normal  Mood: depressed  Affect:  Appropriate  Thought Process:  Coherent and Goal Directed  Thought Content:  Logical  Suicidal Thoughts:  Yes with intent and plan  Homicidal Thoughts:  No  Judgement:  Fair  Insight:  Fair  Psychomotor Activity:  Normal  Akathisia:  No  Fund of Knowledge:  Good      Assets:  Communication Skills Desire for Improvement  Cognition:  WNL  ADL's:  Intact       Other History   These have been pulled in through the EMR, reviewed, and updated if appropriate.  Family History:  The patient's family history includes Arthritis in his mother; Diabetes in his father; Hypertension in his father.  Medical History: Past Medical History:  Diagnosis Date   HIV (human immunodeficiency virus infection) (HCC)     Surgical History: History reviewed. No pertinent surgical history.   Medications:   Current Facility-Administered Medications:    acetaminophen (TYLENOL) tablet 650 mg, 650 mg, Oral, Q6H PRN, 650 mg at 07/31/23 0214 **OR** acetaminophen (TYLENOL) suppository 650 mg, 650 mg, Rectal, Q6H PRN, Janalyn Shy, Subrina, MD   bictegravir-emtricitabine-tenofovir AF (BIKTARVY) 50-200-25 MG per tablet 1 tablet, 1 tablet, Oral, Daily, Vu, Trung T, MD, 1 tablet at 07/31/23 6295   feeding supplement (ENSURE ENLIVE / ENSURE PLUS) liquid 237 mL, 237 mL, Oral, BID BM, [name], MD, 237 mL at 07/31/23 2841   magic mouthwash w/lidocaine, 10 mL, Oral, TID PRN, [name], MD   methocarbamol (ROBAXIN) tablet 500 mg, 500 mg, Oral, Q6H PRN, Hazeline Junker B, MD, 500 mg at 07/30/23 1019   ondansetron (ZOFRAN) tablet 4 mg, 4  mg, Oral, Q6H PRN **OR** ondansetron (ZOFRAN) injection 4 mg, 4 mg, Intravenous, Q6H PRN, Sundil, Subrina, MD   penicillin G potassium 12 Million Units in dextrose 5 % 500 mL CONTINUOUS infusion, 12 Million Units, Intravenous, Q12H, Sundil, Subrina, MD, Last Rate: 41.7 mL/hr at 07/31/23 0204, 12 Million Units at 07/31/23 0204   senna-docusate (Senokot-S) tablet 1 tablet, 1 tablet, Oral, Daily, Hazeline Junker B, MD, 1 tablet at 07/31/23 0914   sulfamethoxazole-trimethoprim (BACTRIM) 400-80 MG per tablet 1 tablet, 1 tablet, Oral, Daily, Vu, Trung T, MD, 1 tablet at 07/31/23 0914   terbinafine (LAMISIL) 1 % cream, , Topical, BID, [name], MD, Given at 07/31/23 [phone]  Allergies: Allergies  Allergen Reactions   Fish Allergy Hives and Nausea And Vomiting   Shellfish Allergy Hives and Nausea And Vomiting    Nanine Means, NP

## 2023-07-31 NOTE — Progress Notes (Signed)
 [facility] PROGRESS NOTE  Lee Bridges (DOB: [birth year]) QIO:[id number] PCP: Blanchard Kelch, NP  Brief Narrative: Lee Bridges is a 33 y.o. male with a history of HIV, substance use, bipolar disorder who presented to the ED on 07/28/2023 who was walked to the ED from [facility] for work up of suspected AIDS, neurosyphilis. PCN has been given and patient is stable. Psychiatry consulted for suicidal ideation and recommends [facility] admission once medically stable.   Subjective: No new complaints.   Objective: BP 112/60 (BP Location: Left Arm)   Pulse 85   Temp 98.6 F (37 C) (Oral)   Resp 20   Ht 5\' 2"  (1.575 m)   Wt 54.8 kg   SpO2 96%   BMI 22.10 kg/m   Gen: No distress Pulm: Clear, nonlabored  CV: RRR, no MRG GI: Soft, NT, ND, +BS  Neuro: Alert and oriented. No new focal deficits. Ext: Warm, no deformities Skin: No rashes, lesions or ulcers on visualized skin   CSF: neg gram stain, RBCs 2k, WBC 12, lymphocytic, protein 21.  CT head: No acute abnormalities. ANC 1.4k.  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  - Continue PCN 32M units IV q12h x10-14 days. Since admitting to [facility] after this admission, could insert PICC.  - ID consult appreciated. MR brain nonacute, CXR negative - Follow up CSF studies including culture, JC virus, VDRL, fungal culture. Follow up CMV, EBV, GC/Chl probe. Urine histo Ag pending.   HIV disease: Last CD4 in Oct 2024 was 205. CD4 rechecked now 170. VL was 10.7k.  - Reinitiated biktarvy, bactrim prophylaxis  Tobacco use, marijuana use:  - Nicotine patch, cessation counseling  Cocaine use: +UDS.  - Cessation counseling, monitor for withdrawal (none currently)  Hypokalemia:  - Supplemented, recheck in AM  Oral papillomas:  - Continue magic mouthwash prn. ID DC'ed fluconazole.  Depression, anxiety, bipolar  disorder; suicidal ideation:  - Psychiatry consulted, restarted seroquel, recommend ongoing psychiatric care at [facility] once medical work up complete and stable.   [name], MD [facility] [email or web address] 07/31/2023, 2:51 PM

## 2023-08-01 DIAGNOSIS — A523 Neurosyphilis, unspecified: Secondary | ICD-10-CM | POA: Diagnosis not present

## 2023-08-01 DIAGNOSIS — F332 Major depressive disorder, recurrent severe without psychotic features: Secondary | ICD-10-CM | POA: Diagnosis not present

## 2023-08-01 DIAGNOSIS — B20 Human immunodeficiency virus [HIV] disease: Secondary | ICD-10-CM | POA: Diagnosis not present

## 2023-08-01 LAB — JC VIRUS, PCR CSF: JC Virus PCR, CSF: NEGATIVE

## 2023-08-01 LAB — CSF CULTURE W GRAM STAIN: Culture: NO GROWTH

## 2023-08-01 LAB — T.PALLIDUM AB, TOTAL: T Pallidum Abs: REACTIVE — AB

## 2023-08-01 MED ORDER — SODIUM CHLORIDE 0.9% FLUSH
10.0000 mL | Freq: Two times a day (BID) | INTRAVENOUS | Status: DC
  Administered 2023-08-01 – 2023-08-02 (×3): 10 mL

## 2023-08-01 MED ORDER — SODIUM CHLORIDE 0.9% FLUSH: 10.0000 mL | INTRAVENOUS | Status: DC | PRN

## 2023-08-01 NOTE — Progress Notes (Signed)
 [facility]  Date of Admission:  07/28/2023     Reason for follow-up: HIV/syphilis  Total days of antibiotics 6         ASSESSMENT:  Lee Bridges is doing okay and is tolerating penicillin G for suspected neurosyphilis with CSF VDRL pending.  Continuing to take Biktarvy and Bactrim.  Discussed importance of continuing to take medication daily as prescribed combined with routine follow-up to reduce risk of disease progression and development of complications in the future.  Reviewed plan of care for 14 days of penicillin G for neurosyphilis.  Disposition appears to be behavioral health once medically cleared.  Will defer PICC line to behavioral health/internal medicine.  Sitter at bedside for safety with concern for suicidal ideations.  Continue management of depression/suicidal ideations per behavioral health.  Will arrange follow-up in ID clinic.  Continue current dose of Biktarvy and Bactrim.  Remaining medical and supportive care per internal medicine.  PLAN:  Continue current dose of penicillin G for neurosyphilis with end date 08/12/2023. Depression per behavioral health. Continue current dose of Biktarvy and Bactrim. Arrange follow-up in ID clinic. Remaining medical and supportive care per internal medicine.  Principal Problem:   AIDS (acquired immune deficiency syndrome) (HCC) Active Problems:   History of syphilis   Generalized anxiety disorder   Major depressive disorder, recurrent, severe without psychotic behavior (HCC)   Oral candidiasis   Neurosyphilis    bictegravir-emtricitabine-tenofovir AF  1 tablet Oral Daily   enoxaparin (LOVENOX) injection  40 mg Subcutaneous Daily   feeding supplement  237 mL Oral BID BM   QUEtiapine  50 mg Oral QHS   senna-docusate  1 tablet Oral Daily   sulfamethoxazole-trimethoprim  1 tablet Oral Daily   terbinafine   Topical BID    SUBJECTIVE:  Afebrile overnight with no acute events.  Feeling better with  headache slightly improved.  Sitter at bedside.  Tolerating penicillin, Biktarvy, and Bactrim.  Allergies  Allergen Reactions   Fish Allergy Hives and Nausea And Vomiting   Shellfish Allergy Hives and Nausea And Vomiting     Review of Systems: Review of Systems  Constitutional:  Negative for chills, fever and weight loss.  Respiratory:  Negative for cough, shortness of breath and wheezing.   Cardiovascular:  Negative for chest pain and leg swelling.  Gastrointestinal:  Negative for abdominal pain, constipation, diarrhea, nausea and vomiting.  Skin:  Negative for rash.      OBJECTIVE: Vitals:   07/31/23 2350 08/01/23 0410 08/01/23 0749 08/01/23 1110  BP:   117/60 118/69  Pulse:   91 81  Resp:   20 19  Temp: 98.7 F (37.1 C) 98.5 F (36.9 C) 98.6 F (37 C) 98.6 F (37 C)  TempSrc: Oral Oral Oral Oral  SpO2:   97% 98%  Weight:      Height:       Body mass index is 22.1 kg/m.  Physical Exam Constitutional:      General: He is not in acute distress.    Appearance: He is well-developed.  Cardiovascular:     Rate and Rhythm: Normal rate and regular rhythm.     Heart sounds: Normal heart sounds.  Pulmonary:     Effort: Pulmonary effort is normal.     Breath sounds: Normal breath sounds.  Skin:    General: Skin is warm and dry.  Neurological:     Mental Status: He is alert and oriented to person, place, and time.  Lab Results Lab Results  Component Value Date   WBC 2.5 (L) 07/31/2023   HGB 12.5 (L) 07/31/2023   HCT 37.1 (L) 07/31/2023   MCV 101.4 (H) 07/31/2023   PLT 254 07/31/2023    Lab Results  Component Value Date   CREATININE 1.13 07/31/2023   BUN 13 07/31/2023   NA 136 07/31/2023   K 4.1 07/31/2023   CL 105 07/31/2023   CO2 22 07/31/2023    Lab Results  Component Value Date   ALT 10 07/29/2023   AST 18 07/29/2023   ALKPHOS 50 07/29/2023   BILITOT 0.5 07/29/2023     Microbiology: Recent Results (from the past 240 hours)  Resp panel by  RT-PCR (RSV, Flu A&B, Covid) Anterior Nasal Swab     Status: None   Collection Time: 07/28/23 11:40 AM   Specimen: Anterior Nasal Swab  Result Value Ref Range Status   SARS Coronavirus 2 by RT PCR NEGATIVE NEGATIVE Final   Influenza A by PCR NEGATIVE NEGATIVE Final   Influenza B by PCR NEGATIVE NEGATIVE Final    Comment: (NOTE) The Xpert Xpress SARS-CoV-2/FLU/RSV plus assay is intended as an aid in the diagnosis of influenza from Nasopharyngeal swab specimens and should not be used as a sole basis for treatment. Nasal washings and aspirates are unacceptable for Xpert Xpress SARS-CoV-2/FLU/RSV testing.  Fact Sheet for Patients: BloggerCourse.com  Fact Sheet for Healthcare Providers: SeriousBroker.it  This test is not yet approved or cleared by the [place] FDA and has been authorized for detection and/or diagnosis of SARS-CoV-2 by FDA under an Emergency Use Authorization (EUA). This EUA will remain in effect (meaning this test can be used) for the duration of the COVID-19 declaration under Section 564(b)(1) of the Act, 21 U.S.C. section 360bbb-3(b)(1), unless the authorization is terminated or revoked.     Resp Syncytial Virus by PCR NEGATIVE NEGATIVE Final    Comment: (NOTE) Fact Sheet for Patients: BloggerCourse.com  Fact Sheet for Healthcare Providers: SeriousBroker.it  This test is not yet approved or cleared by the [place] FDA and has been authorized for detection and/or diagnosis of SARS-CoV-2 by FDA under an Emergency Use Authorization (EUA). This EUA will remain in effect (meaning this test can be used) for the duration of the COVID-19 declaration under Section 564(b)(1) of the Act, 21 U.S.C. section 360bbb-3(b)(1), unless the authorization is terminated or revoked.  Performed at [facility], [address] [place], [place] [address]   CSF culture  w Gram Stain     Status: None   Collection Time: 07/29/23 12:45 AM   Specimen: CSF; Cerebrospinal Fluid  Result Value Ref Range Status   Specimen Description CSF  Final   Special Requests Immunocompromised  Final   Gram Stain   Final    CYTOSPIN SMEAR WBC PRESENT, PREDOMINANTLY MONONUCLEAR NO ORGANISMS SEEN    Culture   Final    NO GROWTH 3 DAYS Performed at [facility], [address] [place], [place] [address]    Report Status 08/01/2023 FINAL  Final     Marcos Eke, NP [facility]  08/01/2023  4:04 PM

## 2023-08-01 NOTE — Progress Notes (Signed)
 [facility] PROGRESS NOTE  Lee Bridges (DOB: January 05, 1991) ZOX:[id number] PCP: Blanchard Kelch, NP  Brief Narrative: Lee Bridges is a 33 y.o. male with a history of HIV, substance use, bipolar disorder who presented to the ED on 07/28/2023 who was walked to the ED from [facility] for work up of suspected AIDS, neurosyphilis. PCN has been given and patient is stable. Psychiatry consulted for suicidal ideation and recommends [facility] admission once medically stable.   Subjective: No new complaints, has family coming to visit.   Objective: BP 117/60 (BP Location: Right Arm)   Pulse 91   Temp 98.6 F (37 C) (Oral)   Resp 20   Ht 5\' 2"  (1.575 m)   Wt 54.8 kg   SpO2 97%   BMI 22.10 kg/m   No distress Clear, nonlabored RRR, no MRG Soft, NT, ND, +BS Alert, oriented nonfocal  CSF: neg gram stain, RBCs 2k, WBC 12, lymphocytic, protein 21.  CT head: No acute abnormalities. ANC 1.4k.  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  - Continue PCN 57M units IV q12h x10-14 days. Since admitting to [facility] after this admission, I've reached out to them Re: their capability to take pt with PICC on antibiotic. They're looking into this.  - ID consult appreciated. MR brain nonacute, CXR negative - Follow up CSF studies including culture, JC virus, VDRL, fungal culture. Follow up CMV, EBV, GC/Chl probe. Urine histo Ag pending.   HIV disease: Last CD4 in Oct 2024 was 205. CD4 rechecked now 170. VL was 10.7k.  - Reinitiated biktarvy, bactrim prophylaxis  Tobacco use, marijuana use:  - Nicotine patch, cessation counseling  Cocaine and marijuana use: +UDS. No IVDU history.  - Cessation counseling, monitor for withdrawal (none currently)  Hypokalemia:  - Supplemented, resolved.  Oral papillomas:  - Continue magic mouthwash prn. ID DC'ed fluconazole.  Depression,  anxiety, bipolar disorder; suicidal ideation:  - Psychiatry consulted, restarted seroquel, recommend ongoing psychiatric care at [facility] after medically cleared. He needs to continue IV penicillin per ID, but no further work up or treatments are required. No other barrier to discharge.   [name], MD [facility] [email or web address] 08/01/2023, 12:46 PM

## 2023-08-01 NOTE — Progress Notes (Signed)
 Pharmacy Antibiotic Note  Lee Bridges is a 33 y.o. male admitted on 07/28/2023 with blurry vision and bilateral  lower extremity tingling/numbness.  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 PMH includes HIV/AIDs (Poor adherence with Biktarvy and Bactrim DS) and substance abuse.   Diagnosed with syphilis 03/2023 with titer 1:32 (did not receive treatment), RPR this admission 1:256   Plan: Continue Penicillin 12 million continuous units IV every 12 hours  Home health to evaluate if patient can be home health / OPAT candidate.   Height: 5\' 2"  (157.5 cm) Weight: 54.8 kg (120 lb 13 oz) IBW/kg (Calculated) : 54.6 Temp (24hrs), Avg:98.7 F (37.1 C), Min:98.5 F (36.9 C), Max:99 F (37.2 C)  Recent Labs  Lab 07/28/23 1140 07/29/23 0238 07/31/23 0438  WBC 3.2* 3.0* 2.5*  CREATININE 0.96 1.02 1.13    Estimated Creatinine Clearance: 73.1 mL/min (by C-G formula based on SCr of 1.13 mg/dL).    Allergies  Allergen Reactions   Fish Allergy Hives and Nausea And Vomiting   Shellfish Allergy Hives and Nausea And Vomiting    Antimicrobials this admission: Penicillin 2/28 >>  Fluconazole 2/28 x 1  Bactrim 3/1 >>   Microbiology results: 2/28 CSF Cx: NGTD x 2d  2/28 Fungal Cx:  sent   Thank you for allowing pharmacy to be a part of this patient's care.  Estill Batten, PharmD, BCCCP  Clinical Pharmacist 08/01/2023 9:02 AM

## 2023-08-01 NOTE — Progress Notes (Signed)

## 2023-08-02 DIAGNOSIS — F331 Major depressive disorder, recurrent, moderate: Secondary | ICD-10-CM | POA: Diagnosis not present

## 2023-08-02 DIAGNOSIS — F411 Generalized anxiety disorder: Secondary | ICD-10-CM | POA: Diagnosis not present

## 2023-08-02 DIAGNOSIS — B20 Human immunodeficiency virus [HIV] disease: Secondary | ICD-10-CM | POA: Diagnosis not present

## 2023-08-02 LAB — VDRL, CSF: VDRL Quant, CSF: NONREACTIVE

## 2023-08-02 LAB — HISTOPLASMA ANTIGEN, URINE: Histoplasma Antigen, urine: NEGATIVE (ref <0.2)

## 2023-08-02 MED ORDER — PENICILLIN G POTASSIUM IV (FOR PTA / DISCHARGE USE ONLY): 24.0000 10*6.[IU] | INTRAVENOUS | 0 refills | Status: DC

## 2023-08-02 NOTE — Consult Note (Signed)
 [facility] Psychiatric Consult Initial  Patient Name: .Lee Bridges  MRN: [id number]  DOB: 1990-07-14  Consult Order details:  Orders (From admission, onward)     Start     Ordered   07/30/23 1840  IP CONSULT TO PSYCHIATRY       Ordering Provider: [name], MD  Provider:  (Not yet assigned)  Question Answer Comment  Location [facility]   Reason for Consult? Suicidal ideation      07/30/23 1840             Mode of Visit: In person    Psychiatry Consult Evaluation  Service Date: August 02, 2023 LOS:  LOS: 4 days  Chief Complaint "I am not suicidal"  Primary Psychiatric Diagnoses  Major depressive disorder, recurrent, severe without psychosis on admission 2.  Stimulant use d/o  Assessment  Lee Bridges is a 33 y.o. male admitted: Medicallyfor 07/28/2023 10:47 AM for GI issues. He carries the psychiatric diagnoses of MDD and stimulant use d/o and has a past medical history of  HIV, syphilis, neurosyphilis.   On initial psychiatric evaluation, patient drowsy in bed upon assessment. He recently stopped taking his HIV meds because "nobody cares about me" and he know longer wanted to live.  He reports experiencing "lots" of depression and continuation of suicidal ideations. When inquired about his suicidal ideation the patient reported his thoughts are predominately passive in nature with wanting to go to sleep and not wake up.  These are interspersed with active suicidal ideations of continuing to not take his HIV medication and using too much cocaine. One past suicide attempt with admission to [facility]. He also reports high levels of anxiety manifesting as excessive worry with increase in sweating. The client meets criteria for MDD as evidenced by a decrease in appetite with weight loss, anhedonia, low energy, poor motivation, and suicidal ideations. He reports taking 4g of cocaine daily. He started using cocaine at the age of 76 and became a problem at the age of 29, last  year.  His UDS on 07/29/23 was positive for benzodiazepines, cocaine, and THC. His current outpatient psychotropic medications include Seroquel 50 mg and historically he has had a positive response to these medications. Patient reported going up on the dose once to 100mg  and it was too much. Trazodone in the past caused priapism.  He was none compliant with medications prior to admission as evidenced by self-report. On initial examination, patient was drowsy at first, cooperative.    08/02/2023: Patient was seen in his room.  He is awake, alert, and oriented.  His father, grandmother and grandfather are at bedside.  With his permission, he is interviewed and collateral is obtained.  Patient specifically denies any suicidal ideation, plan, or intent.  He denies homicidal ideation.  He denies any auditory or visual hallucinations.  He states he believes he "got down" because he had not been taking his medication.  He admits that he lives alone, but has been able to work the 2-10 PM shift at the [employer].  He is hoping to discharge so that he can get back to work.  Patient states that his mother, Lee Bridges, lives locally and checks on him regularly.  With his permission, we do safety planning with his mother by phone.  Mother specifically denies any concern for patient's safety.  She does not believe that he is a danger to others.  She is agreeable to checking on him daily to ensure his safety, as  well as ensuring that he has follow-up with psychiatry. Patient does endorse drug use, but states "I can stop that".  He is offered admission to [facility], which she declines at this time.  He does want to get back into medication management at the [facility] behavioral health clinic.  He declines offer for psychotherapy at this time.  Patient states that Seroquel has been helpful for mood stabilization.  He states that it does make him sleepy at night and then "balances him through the day". He was able to engage  in safety planning including plan to return to [facility] or contact emergency services if he feels unable to maintain his own safety or the safety of others. Patient had no further questions, comments, or concerns.  Plan to discharge him on 08/03/2023 into the care of his mother, who agrees to maintain patient safety. Patient aware to return to nearest crisis center, ED or to call 911 for worsening symptoms of depression, suicidal or homicidal thoughts or AVH.  I have confirmed patient to be able to be scheduled for a re- intake appointment with Gretchen Short, PA-C on 08/09/2023 at 11 AM.  I have confirmed with mom that she is able to take him to that appointment, and after that he can schedule video visits, as he does not have reliable transportation.  Please see plan below for detailed recommendations.   Diagnoses:  Active Hospital problems: Principal Problem:   AIDS (acquired immune deficiency syndrome) (HCC) Active Problems:   History of syphilis   Generalized anxiety disorder   MDD (major depressive disorder), recurrent episode, moderate (HCC)   Oral candidiasis   Neurosyphilis    Plan   ## Psychiatric Medication Recommendations:  Continue Seroquel 50 mg at bedtime  ## Medical Decision Making Capacity: Not specifically addressed in this encounter  ## Further Work-up:  EKG -- most recent EKG on 07/31/2023 had QtC of 469 -- Pertinent labwork reviewed earlier this admission includes: CBC, CMP, U/A, UDS -- UDS positive for cocaine, benzodiazepines, THC -- RPR reactive, patient has been treated with penicillin -- Patient will follow up with infectious diseases after discharge   ## Disposition:-- We recommend inpatient psychiatric hospitalization when medically cleared. Patient is under voluntary admission status at this time; please IVC if attempts to leave hospital.  ## Behavioral / Environmental: -Utilize compassion and acknowledge the patient's experiences while setting clear and  realistic expectations for care.    ## Safety and Observation Level:  - Based on my clinical evaluation, I estimate the patient to be at low risk of self harm in the current setting. - At this time, we recommend  routine. This decision is based on my review of the chart including patient's history and current presentation, interview of the patient, mental status examination, and consideration of suicide risk including evaluating suicidal ideation, plan, intent, suicidal or self-harm behaviors, risk factors, and protective factors. This judgment is based on our ability to directly address suicide risk, implement suicide prevention strategies, and develop a safety plan while the patient is in the clinical setting. Please contact our team if there is a concern that risk level has changed.  CSSR Risk Category:C-SSRS RISK CATEGORY: High Risk  Suicide Risk Assessment: Patient has following modifiable risk factors for suicide: active suicidal ideation, under treated depression , social isolation, recklessness, and medication noncompliance, which we are addressing by restarting his Seroquel 50mg . Patient has following non-modifiable or demographic risk factors for suicide: male gender, history of suicide attempt, and psychiatric  hospitalization Patient has the following protective factors against suicide: Access to outpatient mental health care and no history of NSSIB  Thank you for this consult request. Recommendations have been communicated to the primary team.   Psychiatry will sign off.  Please re-consult for any future acute psychiatric concerns.    While future psychiatric events cannot be accurately predicted, the patient does not currently require acute inpatient psychiatric care and does not currently meet [facility] involuntary commitment criteria.  Mariel Craft, MD       History of Present Illness  Relevant Aspects of [facility] Course:  Admitted on 07/28/2023 for GI issues.  They are suicidal and stopped taking his HIV medications because he does not want to live, related to stressors.   Patient Report:  08/02/2023: Denies SI, HI, AVH.  Reports mood stability while on Seroquel 50 mg.  Previously took 100 mg which was too sedating. Collateral from family members with no concerns for patient being at risk to self or others.  Outpatient psychiatric appointment has been secured for 08/09/2023 to reestablish for medication management.  Patient refused [facility] IOP.  Mother will ensure patient can make his intake appointment.   From intake: The patient reports stopping his HIV medications because "nobody cares about me". He reports experiencing moderate levels of depression and suicidal ideation. The patient endorses high levels of anxiety and excessive sweating as a result. He reports a history of one suicide attempt and a subsequent 2 week psychiatric hospitalization. Per the patient, the psychiatric facility set up a transfer to a rehab facility, but that he didn't go due to not being allowed to get his belongings from the hotel he had been staying at.  He reports he started using cocaine at 33 years old, but that he did not feel like it was a problem until this year (ex. skipped work to pick up cocaine). He reports using 4g of cocaine daily and denies any other drug use or alcohol consumption. The patient expressed interest in going to a drug rehabilitation clinic upon discharge.  Psych ROS:  Depression: Denies Anxiety: Denies Mania (lifetime and current): none without being under the influence of cocaine Psychosis: (lifetime and current): not without cocaine use  Review of Systems  Constitutional: Negative.   HENT: Negative.    Eyes: Negative.   Respiratory: Negative.    Cardiovascular: Negative.   Gastrointestinal: Negative.   Genitourinary: Negative.   Musculoskeletal: Negative.   Skin: Negative.   Neurological: Negative.   Endo/Heme/Allergies: Negative.    Psychiatric/Behavioral:  Positive for substance abuse (in early remission while hospitalized). Negative for depression, hallucinations, memory loss and suicidal ideas. The patient is not nervous/anxious and does not have insomnia.      Psychiatric and Social History  Psychiatric History:  Information collected from patient and chart.  Prev Dx/Sx: MDD, stimulant use d/o Current Psych Provider: [facility] Home Meds (current): Seroquel 50 mg at bedtime Previous Med Trials: Trazodone (priapism),  Therapy: past therapy  Prior Psych Hospitalization: [facility] at [facility] Prior Self Harm: once last year Prior Violence: none  Family Psych History: substance use d/o  Social History:  Living Situation: houseless  Access to weapons/lethal means: none   Substance History Alcohol: denied  Tobacco: denies Illicit drugs: cocaine Prescription drug abuse: denied Rehab hx: none  Exam Findings  Physical Exam:  Vital Signs:  Temp:  [97.6 F (36.4 C)-98.5 F (36.9 C)] 98.3 F (36.8 C) (03/04 1101) Pulse Rate:  [95-96] 96 (03/04 0738) Resp:  [  18] 18 (03/04 1101) BP: (111-127)/(63-84) 111/73 (03/04 1101) SpO2:  [93 %-100 %] 100 % (03/04 0738) Blood pressure 111/73, pulse 96, temperature 98.3 F (36.8 C), temperature source Oral, resp. rate 18, height 5\' 2"  (1.575 m), weight 54.8 kg, SpO2 100%. Body mass index is 22.1 kg/m.  Physical Exam  Mental Status Exam: General Appearance: Casual and Neat  Orientation:  Full (Time, Place, and Person)  Memory:  good  Concentration:  Fair  Recall:  Fair  Attention  Fair  Eye Contact:  Fair  Speech:  Normal Rate  Language:  Good  Volume:  Normal  Mood: Euthymic  Affect:  Appropriate  Thought Process:  Coherent and Goal Directed  Thought Content:  Logical  Suicidal Thoughts:  No  Homicidal Thoughts:  No  Judgement:  Fair  Insight:  Fair  Psychomotor Activity:  Normal  Akathisia:  No  Fund of Knowledge:  Good      Assets:  Communication  Skills Desire for Improvement Financial Resources/Insurance Housing Resilience Social Support Vocational/Educational  Cognition:  WNL  ADL's:  Intact       Other History   These have been pulled in through the EMR, reviewed, and updated if appropriate.  Family History:  The patient's family history includes Arthritis in his mother; Diabetes in his father; Hypertension in his father.  Medical History: Past Medical History:  Diagnosis Date   HIV (human immunodeficiency virus infection) (HCC)     Surgical History: History reviewed. No pertinent surgical history.   Medications:   Current Facility-Administered Medications:    acetaminophen (TYLENOL) tablet 650 mg, 650 mg, Oral, Q6H PRN, 650 mg at 08/01/23 1513 **OR** acetaminophen (TYLENOL) suppository 650 mg, 650 mg, Rectal, Q6H PRN, Janalyn Shy, Subrina, MD   bictegravir-emtricitabine-tenofovir AF (BIKTARVY) 50-200-25 MG per tablet 1 tablet, 1 tablet, Oral, Daily, Vu, Trung T, MD, 1 tablet at 08/02/23 1022   enoxaparin (LOVENOX) injection 40 mg, 40 mg, Subcutaneous, Daily, Hazeline Junker B, MD, 40 mg at 08/02/23 1022   feeding supplement (ENSURE ENLIVE / ENSURE PLUS) liquid 237 mL, 237 mL, Oral, BID BM, [name], MD, 237 mL at 07/31/23 1610   magic mouthwash w/lidocaine, 10 mL, Oral, TID PRN, [name], MD   methocarbamol (ROBAXIN) tablet 500 mg, 500 mg, Oral, Q6H PRN, [name], MD, 500 mg at 08/01/23 2213   ondansetron (ZOFRAN) tablet 4 mg, 4 mg, Oral, Q6H PRN **OR** ondansetron (ZOFRAN) injection 4 mg, 4 mg, Intravenous, Q6H PRN, Sundil, Subrina, MD   penicillin G potassium 12 Million Units in dextrose 5 % 500 mL CONTINUOUS infusion, 12 Million Units, Intravenous, Q12H, Veryl Speak, FNP, Last Rate: 41.7 mL/hr at 08/02/23 1141, Infusion Verify at 08/02/23 1141   QUEtiapine (SEROQUEL) tablet 50 mg, 50 mg, Oral, QHS, Lord, Jamison Y, NP, 50 mg at 08/01/23 2212   senna-docusate (Senokot-S) tablet 1 tablet, 1 tablet,  Oral, Daily, Hazeline Junker B, MD, 1 tablet at 08/01/23 0946   sodium chloride flush (NS) 0.9 % injection 10-40 mL, 10-40 mL, Intracatheter, Q12H, Hazeline Junker B, MD, 10 mL at 08/02/23 1023   sodium chloride flush (NS) 0.9 % injection 10-40 mL, 10-40 mL, Intracatheter, PRN, [name], MD   sulfamethoxazole-trimethoprim (BACTRIM) 400-80 MG per tablet 1 tablet, 1 tablet, Oral, Daily, Vu, Trung T, MD, 1 tablet at 08/02/23 1022   terbinafine (LAMISIL) 1 % cream, , Topical, BID, [name], MD, Given at 08/02/23 1024  Allergies: Allergies  Allergen Reactions   Fish  Allergy Hives and Nausea And Vomiting   Shellfish Allergy Hives and Nausea And Vomiting    Mariel Craft, MD  Total Time Spent in Direct Patient Care:  I personally spent 65 minutes on the unit in direct patient care. The direct patient care time included face-to-face time with the patient, reviewing the patient's chart, communicating with other professionals, and coordinating care. Greater than 50% of this time was spent in counseling or coordinating care with the patient regarding goals of hospitalization, psycho-education, and discharge planning needs.

## 2023-08-02 NOTE — Plan of Care (Signed)
  Problem: Education: Goal: Knowledge of General Education information will improve Description: Including pain rating scale, medication(s)/side effects and non-pharmacologic comfort measures 08/02/2023 2212 by Carlene Coria, RN Outcome: Progressing 08/02/2023 2102 by Carlene Coria, RN Outcome: Progressing   Problem: Health Behavior/Discharge Planning: Goal: Ability to manage health-related needs will improve 08/02/2023 2212 by Carlene Coria, RN Outcome: Progressing 08/02/2023 2102 by Carlene Coria, RN Outcome: Progressing   Problem: Clinical Measurements: Goal: Ability to maintain clinical measurements within normal limits will improve 08/02/2023 2212 by Carlene Coria, RN Outcome: Progressing 08/02/2023 2102 by Carlene Coria, RN Outcome: Progressing Goal: Will remain free from infection 08/02/2023 2212 by Carlene Coria, RN Outcome: Progressing 08/02/2023 2102 by Carlene Coria, RN Outcome: Progressing Goal: Diagnostic test results will improve 08/02/2023 2212 by Carlene Coria, RN Outcome: Progressing 08/02/2023 2102 by Carlene Coria, RN Outcome: Progressing Goal: Respiratory complications will improve 08/02/2023 2212 by Carlene Coria, RN Outcome: Progressing 08/02/2023 2102 by Carlene Coria, RN Outcome: Progressing Goal: Cardiovascular complication will be avoided 08/02/2023 2212 by Carlene Coria, RN Outcome: Progressing 08/02/2023 2102 by Carlene Coria, RN Outcome: Progressing   Problem: Activity: Goal: Risk for activity intolerance will decrease 08/02/2023 2212 by Carlene Coria, RN Outcome: Progressing 08/02/2023 2102 by Carlene Coria, RN Outcome: Progressing   Problem: Nutrition: Goal: Adequate nutrition will be maintained 08/02/2023 2212 by Carlene Coria, RN Outcome: Progressing 08/02/2023 2102 by Carlene Coria, RN Outcome: Progressing   Problem: Coping: Goal: Level of anxiety will decrease 08/02/2023  2212 by Carlene Coria, RN Outcome: Progressing 08/02/2023 2102 by Carlene Coria, RN Outcome: Progressing   Problem: Elimination: Goal: Will not experience complications related to bowel motility 08/02/2023 2212 by Carlene Coria, RN Outcome: Progressing 08/02/2023 2102 by Carlene Coria, RN Outcome: Progressing Goal: Will not experience complications related to urinary retention 08/02/2023 2212 by Carlene Coria, RN Outcome: Progressing 08/02/2023 2102 by Carlene Coria, RN Outcome: Progressing   Problem: Pain Managment: Goal: General experience of comfort will improve and/or be controlled 08/02/2023 2212 by Carlene Coria, RN Outcome: Progressing 08/02/2023 2102 by Carlene Coria, RN Outcome: Progressing   Problem: Safety: Goal: Ability to remain free from injury will improve 08/02/2023 2212 by Carlene Coria, RN Outcome: Progressing 08/02/2023 2102 by Carlene Coria, RN Outcome: Progressing   Problem: Skin Integrity: Goal: Risk for impaired skin integrity will decrease 08/02/2023 2212 by Carlene Coria, RN Outcome: Progressing 08/02/2023 2102 by Carlene Coria, RN Outcome: Progressing

## 2023-08-02 NOTE — Progress Notes (Signed)
    [facility]  Date of Admission:  07/28/2023             BRIEF PROGRESS NOTE:  Diagnosis: Suspected Neurosyphilis   Allergies  Allergen Reactions   Fish Allergy Hives and Nausea And Vomiting   Shellfish Allergy Hives and Nausea And Vomiting    OPAT Orders Discharge antibiotics to be given via PICC/midline  Discharge antibiotics: Penicillin G continuous IV  Per pharmacy protocol  Aim for Vancomycin trough 15-20 or AUC 400-550 (unless otherwise indicated) Duration: 2 weeks  End Date: 08/11/22   PIC/Midline Care Per Protocol:  Home health RN for IV administration and teaching; PICC line care and labs.    Labs weekly while on IV antibiotics: _X_ CBC with differential _X_ BMP __ CMP __ CRP __ ESR __ Vancomycin trough __ CK  _X_ Please pull PIC at completion of IV antibiotics __ Please leave PIC in place until doctor has seen patient or been notified  Fax weekly labs to [phone]  Clinic Follow Up Appt:  08/12/23 at 10:45 am with Marcos Eke, NP    Marcos Eke, NP [facility]  08/02/2023  1:10 PM

## 2023-08-02 NOTE — TOC Initial Note (Signed)
 Transition of Care (TOC) - Initial/Assessment Note  Donn Pierini RN,BSN Transitions of Care Unit 4NP (Non Trauma)- RN Case Manager See Treatment Team for direct Phone #   Patient Details  Name: Lee Bridges MRN: [id number] Date of Birth: [birth year]  Transition of Care [facility]) CM/SW Contact:    Darrold Span, RN Phone Number: 08/02/2023, 1:29 PM  Clinical Narrative:                 Msg received from MD that pt has been cleared to return home, will need IV abx per ID with end date 08/12/23. Pt has Midline in place.   CM spoke with pt at bedside (father, grandmother and grandmother also present). Per pt he lives alone, however his mother lives away and is available to assist as needed for home IV abx needs.   CM discussed need for home infusion coordination- pt voiced he does not have a preference for provider and agreeable to coordinate with [facility] per ID referral. [facility] liaison Pam already following and will contact pt to arrange bedside education and coordinate home PCN-G continuous infusion needs. Pam will also coordinate HHRN needs.   Pt voiced that family will transport home.   Phone # contact is [phone] Address: [address], [place] [address]  TOC to continue to follow for transition coordination    Expected Discharge Plan: Home w Home Health Services Barriers to Discharge: No Barriers Identified   Patient Goals and CMS Choice Patient states their goals for this hospitalization and ongoing recovery are:: return home   Choice offered to / list presented to : Patient      Expected Discharge Plan and Services In-house Referral: Clinical Social Work Discharge Planning Services: CM Consult Post Acute Care Choice: Home Health Living arrangements for the past 2 months: Single Family Home                 DME Arranged: N/A DME Agency: NA       HH Arranged: RN, IV Antibiotics HH Agency: [facility] Date HH Agency Contacted: 08/02/23 Time HH Agency  Contacted: 1329 Representative spoke with at [facility] Agency: Pam  Prior Living Arrangements/Services Living arrangements for the past 2 months: Single Family Home Lives with:: Self Patient language and need for interpreter reviewed:: Yes Do you feel safe going back to the place where you live?: Yes      Need for Family Participation in Patient Care: Yes (Comment) Care giver support system in place?: Yes (comment)   Criminal Activity/Legal Involvement Pertinent to Current Situation/Hospitalization: No - Comment as needed  Activities of Daily Living   ADL Screening (condition at time of admission) Independently performs ADLs?: Yes (appropriate for developmental age) Is the patient deaf or have difficulty hearing?: No Does the patient have difficulty seeing, even when wearing glasses/contacts?: No Does the patient have difficulty concentrating, remembering, or making decisions?: No  Permission Sought/Granted Permission sought to share information with : Facility Industrial/product designer granted to share information with : Yes, Verbal Permission Granted     Permission granted to share info w AGENCY: HH/Infusion  Permission granted to share info w Relationship: mom     Emotional Assessment Appearance:: Appears stated age Attitude/Demeanor/Rapport: Engaged Affect (typically observed): Accepting Orientation: : Oriented to Self, Oriented to Place, Oriented to Situation, Oriented to  Time Alcohol / Substance Use: Illicit Drugs Psych Involvement: No (comment)  Admission diagnosis:  Neurosyphilis [A52.3] Patient Active Problem List   Diagnosis Date Noted   AIDS (acquired  immune deficiency syndrome) (HCC) 07/29/2023   Oral candidiasis 07/29/2023   Neurosyphilis 07/29/2023   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   Generalized anxiety disorder 04/09/2021   MDD (major depressive disorder), recurrent episode, moderate (HCC) 04/09/2021   Cocaine abuse (HCC) 03/23/2021   HIV (human  immunodeficiency virus infection) (HCC) 03/20/2021   History of syphilis 03/20/2021   PCP:  Blanchard Kelch, NP Pharmacy:   [id number] [place], [place] - [address] AT [facility] [address] [place] [address] Phone: [phone] Fax: [phone]  [facility] [phone] @ [address] [place], [place] - [address] [place] [address] Phone: [phone] Fax: [phone]  [id number] - [address] ([address]), [place] - [address] [place] ([place]) [place] [address] Phone: [phone] Fax: [phone]  [facility] #[id number] [place], [place]  - [address] AT [facility] OF [facility] & [address] [place] [address] Phone: [phone] Fax: [phone]     Social Drivers of Health (SDOH) Social History: SDOH Screenings   Food Insecurity: Food Insecurity Present (07/29/2023)  Housing: High Risk (07/29/2023)  Transportation Needs: Unmet Transportation Needs (07/29/2023)  Utilities: At Risk (07/29/2023)  Depression (PHQ2-9): Medium Risk (03/29/2023)  Tobacco Use: High Risk (07/29/2023)   SDOH Interventions:     Readmission Risk Interventions     No data to display

## 2023-08-02 NOTE — Progress Notes (Signed)
 [facility] PROGRESS NOTE  Lee Bridges (DOB: 1991-05-05) WGN:[id number] PCP: Blanchard Kelch, NP  Brief Narrative: Lee Bridges is a 33 y.o. male with a history of HIV, substance use, bipolar disorder who presented to the ED on 07/28/2023 who was walked to the ED from [facility] for work up of suspected AIDS, neurosyphilis. PCN has been given and patient is stable. Psychiatry consulted for suicidal ideation and recommends [facility] admission once medically stable.   Subjective: No complaints, speaking and laughing with sitter in room. Reports he was depressed when his parents didn't come visit him, felt alone and had passive SI. Does not currently feel that way and hopes to get back to work as soon as possible. Would not like to go to [facility]. Requests psychiatry re-evaluation.  Objective: BP 117/84 (BP Location: Left Arm)   Pulse 96   Temp 97.8 F (36.6 C) (Oral)   Resp 18   Ht 5\' 2"  (1.575 m)   Wt 54.8 kg   SpO2 100%   BMI 22.10 kg/m   No distress, lively, alert, interactive, euthymic Clear, nonlabored RRR No meningismus  CSF: neg gram stain, RBCs 2k, WBC 12, lymphocytic, protein 21.  CT head: No acute abnormalities. ANC 1.4k.  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 MR brain nonacute, CXR negative - Continue PCN 422M units IV over 12 hours every 12 hours (continuous 22M u/hr around the clock in elastomeric pump if discharging home) x10-14 days. Plan to continue this thru midline. D/w ID. TOC consulted for assistance in arranging this. - ID consult appreciated.  - Follow up CSF studies including culture (NG3D), VDRL ("in process"), fungal culture ("sent"), and urine histoplasma Ag (in process).  - CMV (neg), EBV (neg), JC virus (neg), crypto Ag (neg), GC/Chl probe (neg). Urine histo Ag pending.   HIV disease: Last CD4 in Oct 2024 was 205. CD4  rechecked now 171. VL was 10.7k.  - Reinitiated biktarvy, bactrim prophylaxis  Tobacco use, marijuana use:  - Nicotine patch, cessation counseling  Cocaine and marijuana use: +UDS. No IVDU history.  - Cessation counseling, monitor for withdrawal (none currently)  Hypokalemia:  - Supplemented, resolved.  Oral papillomas:  - Continue magic mouthwash prn.    Depression, anxiety, bipolar disorder; suicidal ideation:  - Psychiatry consulted, restarted seroquel 50mg  qHS recommend ongoing psychiatric care at [facility] after medically cleared. He needs to continue IV penicillin per ID, but no further work up or treatments are required. Pt appears to deny SI at this time, will request psychiatry reevaluation.   Lee Nine, MD [facility] [email or web address] 08/02/2023, 8:56 AM

## 2023-08-02 NOTE — Progress Notes (Signed)
 PHARMACY CONSULT NOTE FOR:  OUTPATIENT  PARENTERAL ANTIBIOTIC THERAPY (OPAT)  Indication: Neurosyphilis  Regimen: Penicillin G 24 million units every 24 hours as a continuous infusion  End date: 08/11/23  IV antibiotic discharge orders are pended. To discharging provider:  please sign these orders via discharge navigator,  Select New Orders & click on the button choice - Manage This Unsigned Work.     Thank you for allowing pharmacy to be a part of this patient's care.  Sharin Mons, PharmD, BCPS, BCIDP Infectious Diseases Clinical Pharmacist Phone: [phone] 08/02/2023, 1:29 PM

## 2023-08-03 ENCOUNTER — Other Ambulatory Visit (HOSPITAL_COMMUNITY): Payer: Self-pay

## 2023-08-03 DIAGNOSIS — R519 Headache, unspecified: Secondary | ICD-10-CM | POA: Diagnosis not present

## 2023-08-03 DIAGNOSIS — B20 Human immunodeficiency virus [HIV] disease: Secondary | ICD-10-CM | POA: Diagnosis not present

## 2023-08-03 MED ORDER — TERBINAFINE HCL 1 % EX CREA: TOPICAL_CREAM | Freq: Two times a day (BID) | CUTANEOUS | Status: DC

## 2023-08-03 MED ORDER — SULFAMETHOXAZOLE-TRIMETHOPRIM 400-80 MG PO TABS
1.0000 | ORAL_TABLET | Freq: Every day | ORAL | 0 refills | Status: DC
  Filled 2023-08-03: qty 30, 30d supply, fill #0

## 2023-08-03 MED ORDER — QUETIAPINE FUMARATE 50 MG PO TABS
50.0000 mg | ORAL_TABLET | Freq: Every day | ORAL | 0 refills | Status: DC
Start: 2023-08-03 — End: 2023-08-22
  Filled 2023-08-03: qty 30, 30d supply, fill #0

## 2023-08-03 MED ORDER — METHOCARBAMOL 500 MG PO TABS
500.0000 mg | ORAL_TABLET | Freq: Four times a day (QID) | ORAL | 0 refills | Status: DC | PRN
  Filled 2023-08-03: qty 20, 5d supply, fill #0

## 2023-08-03 MED ORDER — BICTEGRAVIR-EMTRICITAB-TENOFOV 50-200-25 MG PO TABS
1.0000 | ORAL_TABLET | Freq: Every day | ORAL | 0 refills | Status: AC
  Filled 2023-08-03: qty 30, 30d supply, fill #0

## 2023-08-03 NOTE — Discharge Summary (Signed)
 Physician Discharge Summary   Patient: Lee Bridges MRN: [id number] DOB: [birth year]  Admit date:     07/28/2023  Discharge date: 08/03/23  Discharge Physician: Rickey Barbara   PCP: Blanchard Kelch, NP   Recommendations at discharge:    Follow up with PCP in 1-2 weeks Follow up with ID as scheduled Follow up with Psychiatry as scheduled Maintain CVL until completion of IV antibiotics through 08/11/23, at which point, please remove CVL  Discharge Diagnoses: Principal Problem:   AIDS (acquired immune deficiency syndrome) (HCC) Active Problems:   Oral candidiasis   Neurosyphilis   History of syphilis   Generalized anxiety disorder   MDD (major depressive disorder), recurrent episode, moderate (HCC)  Resolved Problems:   * No resolved hospital problems. *  Hospital Course: 33 y.o. male with a history of HIV, substance use, bipolar disorder who presented to the ED on 07/28/2023 who was walked to the ED from [facility] for work up of suspected AIDS, neurosyphilis. PCN has been given and patient is stable. Psychiatry consulted for suicidal ideation and recommends [facility] admission once medically stable.   Assessment and Plan: Concern for neurosyphilis: Hx untreated syphilis Oct 2024. 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 MR brain nonacute, CXR negative - Continue PCN 97M units IV over 12 hours every 12 hours (continuous 64M u/hr around the clock in elastomeric pump if discharging home) x10-14 days. Plan to continue this thru midline. D/w ID. TOC consulted for assistance in arranging this. - ID consult appreciated.  - Follow up CSF studies including culture (NG thus far), VDRL ("in process"), fungal culture ("sent"), and urine histoplasma Ag (in process).  - CMV (neg), EBV (neg), JC virus (neg), crypto Ag (neg), GC/Chl probe (neg). Urine histo Ag pending.  -Per ID plan to treat with Pen G IV through 3/13, then remove CVL after completion of abx -Pt to f/u with ID as outpatient   HIV disease: Last CD4 in Oct 2024  was 205. CD4 rechecked now 171. VL was 10.7k.  - Reinitiated biktarvy, bactrim prophylaxis   Tobacco use, marijuana use:  - Nicotine patch, cessation counseling   Cocaine and marijuana use: +UDS. No IVDU history.  - Cessation counseling, monitor for withdrawal (none currently)   Hypokalemia:  - Supplemented, resolved.   Oral papillomas:  - Continue magic mouthwash prn.     Depression, anxiety, bipolar disorder; suicidal ideation:  - Psychiatry consulted, restarted seroquel 50mg  qHS recommend ongoing psychiatric care at [facility] after medically cleared. He needs to continue IV penicillin per ID, but no further work up or treatments are required. Pt appears to deny SI at this time. Psychiatry was consulted this visit -Per most recent psychiatric recommendation, "the patient does not currently require acute inpatient psychiatric care and does not currently meet [facility] involuntary commitment criteria"    Consultants: ID, Psychiatry Procedures performed:   Disposition: Home Diet recommendation:  Regular diet DISCHARGE MEDICATION: Allergies as of 08/03/2023       Reactions   Fish Allergy Hives, Nausea And Vomiting   Shellfish Allergy Hives, Nausea And Vomiting        Medication List     TAKE these medications    bictegravir-emtricitabine-tenofovir AF 50-200-25 MG Tabs tablet Commonly known as: BIKTARVY Take 1 tablet by mouth daily. Start taking on: August 04, 2023   methocarbamol 500 MG tablet Commonly known as: ROBAXIN Take 1 tablet (500 mg total) by mouth every 6 (six) hours as needed for muscle spasms.   penicillin G IVPB Inject 24 Million Units  into the vein daily for 9 days. As a continuous infusion. Indication:  Neurosyphilis  First Dose: Yes Last Day of Therapy:  08/11/23 Labs - Once weekly:  CBC/D and BMP, Labs - Once weekly: ESR and CRP Method of administration: Elastomeric (Continuous infusion) Method of administration may be changed at the discretion of home  infusion pharmacist based upon assessment of the patient and/or caregiver's ability to self-administer the medication ordered.   QUEtiapine 50 MG tablet Commonly known as: SEROQUEL Take 1 tablet (50 mg total) by mouth at bedtime.   sulfamethoxazole-trimethoprim 400-80 MG tablet Commonly known as: BACTRIM Take 1 tablet by mouth daily. Start taking on: August 04, 2023   terbinafine 1 % cream Commonly known as: LAMISIL Apply topically 2 (two) times daily.               Discharge Care Instructions  (From admission, onward)           Start     Ordered   08/02/23 0000  Change dressing on IV access line weekly and PRN  (Home infusion instructions - [facility] )        08/02/23 1545            Follow-up Information     Veryl Speak, FNP Follow up on 08/12/2023.   Specialty: Infectious Diseases Why: 10:45am, Hospital follow up Contact information: [address] [place] [address] [phone]         Blanchard Kelch, NP Follow up in 2 week(s).   Specialty: Infectious Diseases Why: Hospital follow up Contact information: [address] [place] [address] [phone]         Shanna Cisco, NP Follow up on 08/09/2023.   Specialty: Psychiatry Why: Hospital follow up, at [facility] information: [address] [place] [address] [phone]         [facility] Follow up.   Why: Home IV abx arranged- they will hook you up to home infusion prior to discharge and contact you regarding home delivery of abx/supplies. They have coordinated with [facility] for [facility] nursing to follow- Contact information: [address], [place], [place] [address]  Phone: [phone]               Discharge Exam: Ceasar Mons Weights   07/29/23 1344  Weight: 54.8 kg   General exam: Awake, laying in bed, in nad Respiratory system: Normal respiratory effort, no wheezing Cardiovascular system:  regular rate, s1, s2 Gastrointestinal system: Soft, nondistended, positive BS Central nervous system: CN2-12 grossly intact, strength intact Extremities: Perfused, no clubbing Skin: Normal skin turgor, no notable skin lesions seen Psychiatry: Mood normal // no visual hallucinations   Condition at discharge: fair  The results of significant diagnostics from this hospitalization (including imaging, microbiology, ancillary and laboratory) are listed below for reference.   Imaging Studies: DG Chest 2 View Result Date: 07/29/2023 CLINICAL DATA:  Cough EXAM: CHEST - 2 VIEW COMPARISON:  03/11/2023 FINDINGS: The heart size and mediastinal contours are within normal limits. Both lungs are clear. The visualized skeletal structures are unremarkable. IMPRESSION: No active cardiopulmonary disease. Electronically Signed   By: Duanne Guess D.O.   On: 07/29/2023 18:42   MR BRAIN W WO CONTRAST Result Date: 07/29/2023 CLINICAL DATA:  Provided history: Visual disturbance, peripheral. New acute neuropathy. AIDS. EXAM: MRI HEAD WITHOUT AND WITH CONTRAST TECHNIQUE: Multiplanar, multiecho pulse sequences of the brain and surrounding structures were obtained without  and with intravenous contrast. CONTRAST:  5mL GADAVIST GADOBUTROL 1 MMOL/ML IV SOLN COMPARISON:  Non-contrast head CT 07/28/2023. FINDINGS: Brain: No age-advanced or lobar predominant cerebral atrophy. Nonspecific punctate focus of T2 FLAIR hyperintense signal abnormality within the right frontal lobe centrum semiovale (series 6, image 26). No cortical encephalomalacia is identified. There is no acute infarct. No evidence of an intracranial mass. No chronic intracranial blood products. No extra-axial fluid collection. No midline shift. No pathologic intracranial enhancement identified. Vascular: Maintained flow voids within the proximal large arterial vessels. Skull and upper cervical spine: Abnormal T1 hypointense marrow signal within visible portions of  the cervical spine. Sinuses/Orbits: No mass or acute finding within the imaged orbits. Minimal mucosal thickening within the right maxillary sinus. 18 mm mucous retention cyst, and minimal background mucosal thickening, within the left maxillary sinus. Minimal mucosal thickening also present within the right sphenoid and bilateral ethmoid sinuses. IMPRESSION: 1. No evidence of an acute intracranial abnormality. 2. Tiny nonspecific chronic insult within the right frontal lobe white matter. Otherwise unremarkable MRI appearance of the brain. 3. Abnormal T1 hypointense marrow signal within visible portions of the cervical spine. While this finding can reflect a marrow infiltrative process, the most common causes include chronic anemia, smoking and obesity. 4. Paranasal sinus disease as described. Electronically Signed   By: Jackey Loge D.O.   On: 07/29/2023 12:07   CT Head Wo Contrast Result Date: 07/28/2023 CLINICAL DATA:  Headache, vision changes, and fevers in a patient with AIDS. EXAM: CT HEAD WITHOUT CONTRAST TECHNIQUE: Contiguous axial images were obtained from the base of the skull through the vertex without intravenous contrast. RADIATION DOSE REDUCTION: This exam was performed according to the departmental dose-optimization program which includes automated exposure control, adjustment of the mA and/or kV according to patient size and/or use of iterative reconstruction technique. COMPARISON:  None Available. FINDINGS: Brain: No evidence of acute infarction, hemorrhage, hydrocephalus, extra-axial collection or mass lesion/mass effect. No mass or abscess identified. Vascular: No hyperdense vessel or unexpected calcification. Skull: Normal. Negative for fracture or focal lesion. Sinuses/Orbits: Paranasal sinuses and mastoid air cells are clear. Other: None. IMPRESSION: No acute intracranial abnormalities. Electronically Signed   By: Burman Nieves M.D.   On: 07/28/2023 22:25    Microbiology: Results for  orders placed or performed during the hospital encounter of 07/28/23  Resp panel by RT-PCR (RSV, Flu A&B, Covid) Anterior Nasal Swab     Status: None   Collection Time: 07/28/23 11:40 AM   Specimen: Anterior Nasal Swab  Result Value Ref Range Status   SARS Coronavirus 2 by RT PCR NEGATIVE NEGATIVE Final   Influenza A by PCR NEGATIVE NEGATIVE Final   Influenza B by PCR NEGATIVE NEGATIVE Final    Comment: (NOTE) The Xpert Xpress SARS-CoV-2/FLU/RSV plus assay is intended as an aid in the diagnosis of influenza from Nasopharyngeal swab specimens and should not be used as a sole basis for treatment. Nasal washings and aspirates are unacceptable for Xpert Xpress SARS-CoV-2/FLU/RSV testing.  Fact Sheet for Patients: BloggerCourse.com  Fact Sheet for Healthcare Providers: SeriousBroker.it  This test is not yet approved or cleared by the [place] FDA and has been authorized for detection and/or diagnosis of SARS-CoV-2 by FDA under an Emergency Use Authorization (EUA). This EUA will remain in effect (meaning this test can be used) for the duration of the COVID-19 declaration under Section 564(b)(1) of the Act, 21 U.S.C. section 360bbb-3(b)(1), unless the authorization is terminated or revoked.     Resp Syncytial  Virus by PCR NEGATIVE NEGATIVE Final    Comment: (NOTE) Fact Sheet for Patients: BloggerCourse.com  Fact Sheet for Healthcare Providers: SeriousBroker.it  This test is not yet approved or cleared by the [place] FDA and has been authorized for detection and/or diagnosis of SARS-CoV-2 by FDA under an Emergency Use Authorization (EUA). This EUA will remain in effect (meaning this test can be used) for the duration of the COVID-19 declaration under Section 564(b)(1) of the Act, 21 U.S.C. section 360bbb-3(b)(1), unless the authorization is terminated  or revoked.  Performed at [facility], [address], [place], [place] [address]   CSF culture w Gram Stain     Status: None   Collection Time: 07/29/23 12:45 AM   Specimen: CSF; Cerebrospinal Fluid  Result Value Ref Range Status   Specimen Description CSF  Final   Special Requests Immunocompromised  Final   Gram Stain   Final    CYTOSPIN SMEAR WBC PRESENT, PREDOMINANTLY MONONUCLEAR NO ORGANISMS SEEN    Culture   Final    NO GROWTH 3 DAYS Performed at [facility], [address], [place], [place] [address]    Report Status 08/01/2023 FINAL  Final    Labs: CBC: Recent Labs  Lab 07/28/23 1140 07/29/23 0238 07/31/23 0438  WBC 3.2* 3.0* 2.5*  NEUTROABS  --  1.4* 0.6*  HGB 12.8* 12.1* 12.5*  HCT 38.8* 36.3* 37.1*  MCV 104.0* 103.4* 101.4*  PLT 279 251 254   Basic Metabolic Panel: Recent Labs  Lab 07/28/23 1140 07/29/23 0238 07/31/23 0438  NA 136 140 136  K 3.4* 3.3* 4.1  CL 105 106 105  CO2 21* 24 22  GLUCOSE 96 115* 106*  BUN 13 12 13   CREATININE 0.96 1.02 1.13  CALCIUM 9.1 8.6* 8.9  MG  --   --  1.7   Liver Function Tests: Recent Labs  Lab 07/28/23 1140 07/29/23 0238  AST 20 18  ALT 11 10  ALKPHOS 50 50  BILITOT 0.6 0.5  PROT 7.8 7.1  ALBUMIN 3.7 3.0*   CBG: No results for input(s): "GLUCAP" in the last 168 hours.  Discharge time spent: less than 30 minutes.  Signed: Rickey Barbara, MD [facility] 08/03/2023

## 2023-08-03 NOTE — Progress Notes (Signed)
 Order to discharge patient home. Family at bedside. Discharge instructions/AVS given to and reviewed with patient. Education provided as needed. Patient verbalized understanding. PIV removed by this RN. Midline dressing change completed and is clean, dry, and intact for home antibiotics. Currently infusing while connected to personal infusion pump (assisted by [facility]). TOC medications delivered to patient prior to discharge. Personal belongings sent home with patient. Home via private vehicle.

## 2023-08-03 NOTE — TOC Transition Note (Signed)
 Transition of Care (TOC) - Discharge Note Donn Pierini RN,BSN Transitions of Care Unit 4NP (Non Trauma)- RN Case Manager See Treatment Team for direct Phone #   Patient Details  Name: Lee Bridges MRN: [id number] Date of Birth: 11/01/1990  Transition of Care [facility]) CM/SW Contact:  Darrold Span, RN Phone Number: 08/03/2023, 11:23 AM   Clinical Narrative:    Pt stable for transition home today, Psych has placed f/u appointment on AVS.   Home IV abx needs have been set up with [facility], liaison Pam met with pt at the bedside yesterday afternoon along with mom and sister for education. Pam will plan to be at bedside today to hook pt up to home infusion pump between 12-2 prior to discharge.  Per [facility] RN has been set up with [facility] for home IV abx needs coordinating with Home infusion.   Family to transport home.   CM went by room to speak with pt to f/u, however he was sleeping so did not disturb, sitter was at the bedside.    Final next level of care: Home w Home Health Services Barriers to Discharge: No Barriers Identified   Patient Goals and CMS Choice Patient states their goals for this hospitalization and ongoing recovery are:: return home   Choice offered to / list presented to : Patient      Discharge Placement                 Home w/ [facility] for home IV abx      Discharge Plan and Services Additional resources added to the After Visit Summary for   In-house Referral: Clinical Social Work Discharge Planning Services: CM Consult Post Acute Care Choice: Home Health          DME Arranged: N/A DME Agency: NA       HH Arranged: RN, IV Antibiotics HH Agency: [facility] Date HH Agency Contacted: 08/02/23 Time HH Agency Contacted: 1329 Representative spoke with at [facility] Agency: Pam  Social Drivers of Health (SDOH) Interventions SDOH Screenings   Food Insecurity: Food Insecurity Present (07/29/2023)  Housing: High Risk (07/29/2023)  Transportation Needs: Unmet  Transportation Needs (07/29/2023)  Utilities: At Risk (07/29/2023)  Depression (PHQ2-9): Medium Risk (03/29/2023)  Tobacco Use: High Risk (07/29/2023)     Readmission Risk Interventions    08/03/2023   11:22 AM  Readmission Risk Prevention Plan  Transportation Screening Complete  Home Care Screening Complete  Medication Review (RN CM) Complete

## 2023-08-05 ENCOUNTER — Other Ambulatory Visit: Payer: Self-pay

## 2023-08-05 ENCOUNTER — Telehealth: Payer: Self-pay

## 2023-08-05 ENCOUNTER — Encounter (HOSPITAL_COMMUNITY): Payer: Self-pay | Admitting: Emergency Medicine

## 2023-08-05 ENCOUNTER — Emergency Department (HOSPITAL_COMMUNITY)
Admission: EM | Admit: 2023-08-05 | Discharge: 2023-08-05 | Discharge status: Against Medical Advice | Payer: MEDICAID | Attending: Emergency Medicine | Admitting: Emergency Medicine

## 2023-08-05 ENCOUNTER — Emergency Department (HOSPITAL_COMMUNITY): Payer: MEDICAID

## 2023-08-05 DIAGNOSIS — B20 Human immunodeficiency virus [HIV] disease: Secondary | ICD-10-CM | POA: Diagnosis not present

## 2023-08-05 DIAGNOSIS — I809 Phlebitis and thrombophlebitis of unspecified site: Secondary | ICD-10-CM

## 2023-08-05 DIAGNOSIS — I808 Phlebitis and thrombophlebitis of other sites: Secondary | ICD-10-CM | POA: Diagnosis not present

## 2023-08-05 DIAGNOSIS — Z5329 Procedure and treatment not carried out because of patient's decision for other reasons: Secondary | ICD-10-CM | POA: Insufficient documentation

## 2023-08-05 DIAGNOSIS — A523 Neurosyphilis, unspecified: Secondary | ICD-10-CM

## 2023-08-05 DIAGNOSIS — M79602 Pain in left arm: Secondary | ICD-10-CM | POA: Diagnosis present

## 2023-08-05 LAB — BASIC METABOLIC PANEL
Anion gap: 9 (ref 5–15)
BUN: 18 mg/dL (ref 6–20)
CO2: 22 mmol/L (ref 22–32)
Calcium: 9.2 mg/dL (ref 8.9–10.3)
Chloride: 104 mmol/L (ref 98–111)
Creatinine, Ser: 1.1 mg/dL (ref 0.61–1.24)
GFR, Estimated: >60 mL/min (ref >60)
Glucose, Bld: 93 mg/dL (ref 70–99)
Potassium: 4.2 mmol/L (ref 3.5–5.1)
Sodium: 135 mmol/L (ref 135–145)

## 2023-08-05 LAB — CBC
HCT: 35.3 % — ABNORMAL LOW (ref 39.0–52.0)
Hemoglobin: 12 g/dL — ABNORMAL LOW (ref 13.0–17.0)
MCH: 34.2 pg — ABNORMAL HIGH (ref 26.0–34.0)
MCHC: 34 g/dL (ref 30.0–36.0)
MCV: 100.6 fL — ABNORMAL HIGH (ref 80.0–100.0)
Platelets: 254 10*3/uL (ref 150–400)
RBC: 3.51 MIL/uL — ABNORMAL LOW (ref 4.22–5.81)
RDW: 12.6 % (ref 11.5–15.5)
WBC: 4 10*3/uL (ref 4.0–10.5)
nRBC: 0 % (ref 0.0–0.2)

## 2023-08-05 LAB — I-STAT CG4 LACTIC ACID, ED: Lactic Acid, Venous: 1.2 mmol/L (ref 0.5–1.9)

## 2023-08-05 MED ORDER — ONDANSETRON 4 MG PO TBDP
4.0000 mg | ORAL_TABLET | Freq: Once | ORAL | Status: AC
  Administered 2023-08-05: 4 mg via ORAL
  Filled 2023-08-05: qty 1

## 2023-08-05 MED ORDER — DOXYCYCLINE HYCLATE 100 MG PO CAPS: 200.0000 mg | ORAL_CAPSULE | Freq: Two times a day (BID) | ORAL | 0 refills | Status: DC

## 2023-08-05 MED ORDER — OXYCODONE-ACETAMINOPHEN 5-325 MG PO TABS
2.0000 | ORAL_TABLET | Freq: Once | ORAL | Status: AC
  Administered 2023-08-05: 2 via ORAL
  Filled 2023-08-05: qty 2

## 2023-08-05 NOTE — ED Provider Triage Note (Signed)
 Emergency Medicine Provider Triage Evaluation Note  Lee Bridges , a 33 y.o. male  was evaluated in triage.  Pt complains of left arm pain, swelling, "leaking" around picc line insertion site.  Patient discharged 2 days ago from hospital, hx of AIDS and neurosyphilis, planning for penicillin 12M units IV every 12 hours x 10-14 days per ID consult while in hospital  Review of Systems  Positive: Arm pain, swelling Negative:   Physical Exam  BP (!) 127/90   Pulse 93   Temp 97.7 F (36.5 C) (Oral)   Resp 16   SpO2 99%  Gen:   Awake, no distress   Resp:  Normal effort  MSK:   Moves extremities without difficulty  Other:  PICC line in place in left arm, bandaged, no obvious bleeding or leaking, no clear evidence of arm swelling  Medical Decision Making  Medically screening exam initiated at 9:44 AM.  Appropriate orders placed.  Arsal Tappan was informed that the remainder of the evaluation will be completed by another provider, this initial triage assessment does not replace that evaluation, and the importance of remaining in the ED until their evaluation is complete.  Vascular ultrasound ordered to ensure line placement and DVT rule out  No obvious bleeding, leaking, or hemorrhage near the insertion site  Patient will need sterile evaluation of line insertion site when roomed   Terald Sleeper, MD 08/05/23 [phone]

## 2023-08-05 NOTE — Progress Notes (Signed)
 Notified that patient is leaving ED AMA. ED to send in 28 days of doxycycline 200 mg BID for treatment of neurosyphilis.   Tried calling [facility] to let him know, no answer. Left message stating MyChart message is being sent and to please review.   Sandie Ano, RN

## 2023-08-05 NOTE — ED Triage Notes (Signed)
 PT BIB [facility] for arm pain, IV problem. Recently admitted and sent home with PICC. States he thinks it started coming out.  Pain on palpation to site.  No swelling.  Also complaining of CP worse with breathing and palpation. Pt is anxious.  160/100 HR 70 96% RA

## 2023-08-05 NOTE — Progress Notes (Signed)
 Left upper extremity venous  has been completed. Refer to [facility] under chart review to view preliminary results.   08/05/2023  12:01 PM Kurstin Dimarzo, Gerarda Gunther

## 2023-08-05 NOTE — Telephone Encounter (Signed)
 Patient called while waiting in the ED stating he was having swelling in his arm where his midline is.  He reports he no longer wants to have the midline and feels he can not function with it. Patient advised that the ED will discuss an alternative treatment plan with him.  Sable Knoles Jonathon Resides, CMA

## 2023-08-05 NOTE — ED Provider Notes (Signed)
 [place] EMERGENCY DEPARTMENT AT [facility] Provider Note   CSN: [id number] Arrival date & time: 08/05/23  4098     History  No chief complaint on file.   Lee Bridges is a 33 y.o. male with past medical history of AIDS, neurosyphilis, cocaine abuse, oral candidiasis.  Was recently admitted to the hospital with principal problem of AIDS.  He was sent home with a PICC line for treatment of neurosyphilis with IV penicillin G 08/11/2023.  Patient arrives today concerned that his PICC line may have pulled out.  He is currently on Bactrim prophylaxis and reinitiated on Biktarvy while inpatient. Patient reports that his left arm is extremely painful.  He tried running his IV antibiotic medication at home however it seemed to make his whole arm swell and was spilling out of the IV catheter site which she states was very abnormal.  He had tried adjusting his IV but could not get it to run appropriately.  He comes in for evaluation of his left sided PICC line  HPI     Home Medications Prior to Admission medications   Medication Sig Start Date End Date Taking? Authorizing Provider  bictegravir-emtricitabine-tenofovir AF (BIKTARVY) 50-200-25 MG TABS tablet Take 1 tablet by mouth daily. 08/04/23 09/03/23  Jerald Kief, MD  methocarbamol (ROBAXIN) 500 MG tablet Take 1 tablet (500 mg total) by mouth every 6 (six) hours as needed for muscle spasms. 08/03/23   Jerald Kief, MD  penicillin G IVPB Inject 24 Million Units into the vein daily for 9 days. As a continuous infusion. Indication:  Neurosyphilis  First Dose: Yes Last Day of Therapy:  08/11/23 Labs - Once weekly:  CBC/D and BMP, Labs - Once weekly: ESR and CRP Method of administration: Elastomeric (Continuous infusion) Method of administration may be changed at the discretion of home infusion pharmacist based upon assessment of the patient and/or caregiver's ability to self-administer the medication ordered. 08/02/23 08/11/23  [name],  MD  QUEtiapine (SEROQUEL) 50 MG tablet Take 1 tablet (50 mg total) by mouth at bedtime. 08/03/23 09/02/23  Jerald Kief, MD  sulfamethoxazole-trimethoprim (BACTRIM) 400-80 MG tablet Take 1 tablet by mouth daily. 08/04/23 09/03/23  Jerald Kief, MD  terbinafine (LAMISIL) 1 % cream Apply topically 2 (two) times daily. 08/03/23   Jerald Kief, MD      Allergies    Fish allergy and Shellfish allergy    Review of Systems   Review of Systems  Physical Exam Updated Vital Signs BP (!) 127/90   Pulse 93   Temp 97.7 F (36.5 C) (Oral)   Resp 16   SpO2 99%  Physical Exam Vitals and nursing note reviewed.  Constitutional:      General: He is not in acute distress.    Appearance: He is well-developed. He is not diaphoretic.  HENT:     Head: Normocephalic and atraumatic.  Eyes:     General: No scleral icterus.    Conjunctiva/sclera: Conjunctivae normal.  Cardiovascular:     Rate and Rhythm: Normal rate and regular rhythm.     Heart sounds: Normal heart sounds.  Pulmonary:     Effort: Pulmonary effort is normal. No respiratory distress.     Breath sounds: Normal breath sounds.  Abdominal:     Palpations: Abdomen is soft.     Tenderness: There is no abdominal tenderness.  Musculoskeletal:     Cervical back: Normal range of motion and neck supple.     Comments:  PICC line in the left upper extremity with erythematous, tender ropey and cordlike structure ascending from the catheter site consistent with superficial thrombophlebitis  Skin:    General: Skin is warm and dry.  Neurological:     Mental Status: He is alert.  Psychiatric:        Behavior: Behavior normal.     ED Results / Procedures / Treatments   Labs (all labs ordered are listed, but only abnormal results are displayed) Labs Reviewed - No data to display  EKG None  Radiology No results found.  Procedures Procedures    Medications Ordered in ED Medications - No data to display  ED Course/ Medical Decision  Making/ A&P Clinical Course as of 08/05/23 1218  Fri Aug 05, 2023  1139 Case discussed with Dr. Renold Capone [AH]    Clinical Course User Index [AH] Arthor Captain, PA-C                                 Medical Decision Making  This is a 33 year old male with neurosyphilis who came in for evaluation of his PICC line.  Obvious superficial thrombophlebitis.  The PICC line was pulled as it was not flushing after evaluation from our IV team.  I did discussion at bedside with the patient stating that if he did not want another PICC line we would need to get him admitted to get his IV antibiotics.  Patient become extremely upset stating "nobody is compensating me for these hospitalizations and I have bills to pay.  You can just call [employer] if you want to I am leaving." Patient has the capacity to make this decision but was unwilling willing to listen to any discussion or reasonable advice I did warn him that he could die from his current condition. Date: 08/05/2023 Patient: Lee Bridges Admitted: 08/05/2023  9:28 AM Attending Provider: Margarita Grizzle, MD  Lee Bridges or his authorized caregiver has made the decision for the patient to leave the emergency department against the advice of Margarita Grizzle, MD.  He or his authorized caregiver has been informed and understands the inherent risks, including death.  He or his authorized caregiver has decided to accept the responsibility for this decision. Lee Bridges and all necessary parties have been advised that he may return for further evaluation or treatment. His condition at time of discharge was Stable.  Lee Bridges had current vital signs as follows:  Blood pressure (!) 127/90, pulse 93, temperature 97.7 F (36.5 C), temperature source Oral, resp. rate 16, SpO2 99%.   Lee Bridges or his authorized caregiver has not signed the Leaving Against Medical Advice form prior to leaving the department.  Arthor Captain 08/05/2023   Reached out to Dr. Renold Dametri who recommends  doxycycline 200 mg twice daily for 28 days.  I have sent this to his pharmacy.        Final Clinical Impression(s) / ED Diagnoses Final diagnoses:  None    Rx / DC Orders ED Discharge Orders     None         Arthor Captain, PA-C 08/05/23 1224    Margarita Grizzle, MD 08/05/23 [phone]

## 2023-08-05 NOTE — Progress Notes (Signed)
 Received consult to assess midline. Flushed easily with 2 ml of saline but pt reporting significant pain upon flushing near shoulder. No blood return noted. Primary RN notified.

## 2023-08-05 NOTE — ED Notes (Signed)
 Patient transported to vascular.

## 2023-08-08 ENCOUNTER — Ambulatory Visit (HOSPITAL_COMMUNITY)
Admission: EM | Admit: 2023-08-08 | Discharge: 2023-08-09 | Disposition: A | Discharge status: Other Acute Inpatient Hospital | Payer: MEDICAID | Attending: Psychiatry | Admitting: Psychiatry

## 2023-08-08 DIAGNOSIS — F1910 Other psychoactive substance abuse, uncomplicated: Secondary | ICD-10-CM | POA: Diagnosis not present

## 2023-08-08 DIAGNOSIS — Z5900 Homelessness unspecified: Secondary | ICD-10-CM | POA: Insufficient documentation

## 2023-08-08 DIAGNOSIS — F411 Generalized anxiety disorder: Secondary | ICD-10-CM | POA: Insufficient documentation

## 2023-08-08 DIAGNOSIS — F209 Schizophrenia, unspecified: Secondary | ICD-10-CM | POA: Insufficient documentation

## 2023-08-08 DIAGNOSIS — R45851 Suicidal ideations: Secondary | ICD-10-CM | POA: Diagnosis not present

## 2023-08-08 DIAGNOSIS — F319 Bipolar disorder, unspecified: Secondary | ICD-10-CM | POA: Insufficient documentation

## 2023-08-08 DIAGNOSIS — R451 Restlessness and agitation: Secondary | ICD-10-CM | POA: Insufficient documentation

## 2023-08-08 DIAGNOSIS — Z79899 Other long term (current) drug therapy: Secondary | ICD-10-CM | POA: Insufficient documentation

## 2023-08-08 LAB — CBC WITH DIFFERENTIAL/PLATELET
Abs Immature Granulocytes: 0.06 10*3/uL (ref 0.00–0.07)
Basophils Absolute: 0 10*3/uL (ref 0.0–0.1)
Basophils Relative: 1 %
Eosinophils Absolute: 0.3 10*3/uL (ref 0.0–0.5)
Eosinophils Relative: 8 %
HCT: 39.1 % (ref 39.0–52.0)
Hemoglobin: 13.1 g/dL (ref 13.0–17.0)
Immature Granulocytes: 1 %
Lymphocytes Relative: 38 %
Lymphs Abs: 1.7 10*3/uL (ref 0.7–4.0)
MCH: 33.9 pg (ref 26.0–34.0)
MCHC: 33.5 g/dL (ref 30.0–36.0)
MCV: 101.3 fL — ABNORMAL HIGH (ref 80.0–100.0)
Monocytes Absolute: 0.8 10*3/uL (ref 0.1–1.0)
Monocytes Relative: 18 %
Neutro Abs: 1.5 10*3/uL — ABNORMAL LOW (ref 1.7–7.7)
Neutrophils Relative %: 34 %
Platelets: 324 10*3/uL (ref 150–400)
RBC: 3.86 MIL/uL — ABNORMAL LOW (ref 4.22–5.81)
RDW: 11.9 % (ref 11.5–15.5)
WBC: 4.4 10*3/uL (ref 4.0–10.5)
nRBC: 0 % (ref 0.0–0.2)

## 2023-08-08 LAB — COMPREHENSIVE METABOLIC PANEL
ALT: 30 U/L (ref 0–44)
AST: 22 U/L (ref 15–41)
Albumin: 3.5 g/dL (ref 3.5–5.0)
Alkaline Phosphatase: 59 U/L (ref 38–126)
Anion gap: 9 (ref 5–15)
BUN: 14 mg/dL (ref 6–20)
CO2: 26 mmol/L (ref 22–32)
Calcium: 9.5 mg/dL (ref 8.9–10.3)
Chloride: 102 mmol/L (ref 98–111)
Creatinine, Ser: 1.3 mg/dL — ABNORMAL HIGH (ref 0.61–1.24)
GFR, Estimated: >60 mL/min (ref >60)
Glucose, Bld: 95 mg/dL (ref 70–99)
Potassium: 4.1 mmol/L (ref 3.5–5.1)
Sodium: 137 mmol/L (ref 135–145)
Total Bilirubin: 0.5 mg/dL (ref 0.0–1.2)
Total Protein: 8.1 g/dL (ref 6.5–8.1)

## 2023-08-08 LAB — POCT URINE DRUG SCREEN - MANUAL ENTRY (I-SCREEN)
POC Amphetamine UR: POSITIVE — AB
POC Buprenorphine (BUP): NOT DETECTED
POC Cocaine UR: POSITIVE — AB
POC Marijuana UR: POSITIVE — AB
POC Methadone UR: NOT DETECTED
POC Methamphetamine UR: POSITIVE — AB
POC Morphine: NOT DETECTED
POC Oxazepam (BZO): POSITIVE — AB
POC Oxycodone UR: NOT DETECTED
POC Secobarbital (BAR): NOT DETECTED

## 2023-08-08 LAB — TSH: TSH: 0.694 u[IU]/mL (ref 0.350–4.500)

## 2023-08-08 LAB — ETHANOL: Alcohol, Ethyl (B): <10 mg/dL (ref <10)

## 2023-08-08 MED ORDER — MAGNESIUM HYDROXIDE 400 MG/5ML PO SUSP: 30.0000 mL | Freq: Every day | ORAL | Status: DC | PRN

## 2023-08-08 MED ORDER — OLANZAPINE 10 MG IM SOLR: 5.0000 mg | Freq: Three times a day (TID) | INTRAMUSCULAR | Status: DC | PRN

## 2023-08-08 MED ORDER — OLANZAPINE 10 MG IM SOLR: 10.0000 mg | Freq: Three times a day (TID) | INTRAMUSCULAR | Status: DC | PRN

## 2023-08-08 MED ORDER — QUETIAPINE FUMARATE 25 MG PO TABS
25.0000 mg | ORAL_TABLET | Freq: Once | ORAL | Status: AC
  Administered 2023-08-09: 25 mg via ORAL
  Filled 2023-08-08: qty 1

## 2023-08-08 MED ORDER — OLANZAPINE 5 MG PO TBDP: 5.0000 mg | ORAL_TABLET | Freq: Three times a day (TID) | ORAL | Status: DC | PRN

## 2023-08-08 MED ORDER — ACETAMINOPHEN 325 MG PO TABS: 650.0000 mg | ORAL_TABLET | Freq: Four times a day (QID) | ORAL | Status: DC | PRN

## 2023-08-08 MED ORDER — ALUM & MAG HYDROXIDE-SIMETH 200-200-20 MG/5ML PO SUSP: 30.0000 mL | ORAL | Status: DC | PRN
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
## 2023-08-08 NOTE — ED Notes (Signed)
 Patient has been brought on the unit and familiarized with unit, patient is now sitting in bed in no acute distress, will continue to monitor patient for safety.

## 2023-08-08 NOTE — BHH Counselor (Signed)
 LCSW Progress Note:   Kyshawn Teal "[name]"  MRN: [id number]  08/08/2023 10:37 PM  [name], NP, recommends inpatient hospitalization. The patient has been referred to [facility], and [facility], Edythe Clarity, NP, has been requested to review the chart for consideration of an inpatient bed.

## 2023-08-08 NOTE — ED Provider Notes (Signed)
 [facility] Continuous Assessment Admission H&P  Date: 08/08/23 Patient Name: Lee Bridges MRN: [id number] Chief Complaint: suicidal ideation   Diagnoses:  Final diagnoses:  Suicidal ideation  Polysubstance abuse (HCC)  Homelessness unspecified  Agitation    HPI: Lee Bridges,  33 y/o male with a history schizophrenia, bipolar dx,  substance abuse, GAD, MDD.  Presented to [facility] voluntarily.  Per the patient he is suicidal to hang himself patient reports that he has been using cocaine, methamphetamines the last time was yesterday.  According to the patient he is currently homeless and has been homeless for the past 2 years patient stated he was working but lost his job.  According to patient he is currently seeing a provider here at the walk-in psychiatry clinic.  Patient stated that he does not like people and that he is also homicidal for anyone who gets in his way.  According to patient he has been suicidal for a long time.  Face-to-face observation of patient, patient is alert and oriented x 4, speech is clear, maintained little to no eye contact.  Patient is observed sitting in the room with his head down in his lap and throughout the whole interview patient had the same posture.  Patient endorsed suicidal ideation to hang himself but did not state what he would use.  Patient also endorsed homicidal ideation that he will hurt anyone who gets in his way.  Patient reports that he hear voices and see people but could not state what the voices were saying.  Patient denies alcohol use.  Reports he used methamphetamines yesterday, cocaine but he has not used cocaine in a couple of days.  Patient also report marijuana use.  Patient does seem to be influenced by internal stimuli.  But it not clear if it is substance-induced.  Patient does appear to be very depressed, and very nonchalant, patient did not really want to talk too much and just gave one-word answer sometimes.  Given patient history and  presenting symptoms.  Writer discussed with patient that we will be admitting him for further evaluation.  Patient did not say anything but held his head down.  Recommend inpatient admission when a bed becomes available at [facility]  Total Time spent with patient: 20 minutes  Musculoskeletal  Strength & Muscle Tone: within normal limits Gait & Station: normal Patient leans: N/A  Psychiatric Specialty Exam  Presentation General Appearance:  Casual  Eye Contact: Fair  Speech: Clear and Coherent  Speech Volume: Normal  Handedness: Right   Mood and Affect  Mood: Angry; Anxious; Euthymic; Hopeless  Affect: Congruent   Thought Process  Thought Processes: Coherent  Descriptions of Associations:Intact  Orientation:Full (Time, Place and Person)  Thought Content:WDL  Diagnosis of Schizophrenia or Schizoaffective disorder in past: No data recorded  Hallucinations:Hallucinations: Auditory Description of Auditory Hallucinations: hear voices  cant say what the voices are saying  Ideas of Reference:None  Suicidal Thoughts:Suicidal Thoughts: Yes, Active SI Active Intent and/or Plan: With Intent; With Plan  Homicidal Thoughts:Homicidal Thoughts: No   Sensorium  Memory: Immediate Fair  Judgment: Poor  Insight: Fair   Chartered certified accountant: Fair  Attention Span: Fair  Recall: [employer] of Knowledge: Fair  Language: Fair   Psychomotor Activity  Psychomotor Activity: Psychomotor Activity: Normal   Assets  Assets: Desire for Improvement; Housing; Resilience; Social Support   Sleep  Sleep: Sleep: Fair Number of Hours of Sleep: 6   Nutritional Assessment (For OBS and FBC admissions only)  Has the patient had a weight loss or gain of 10 pounds or more in the last 3 months?: No Has the patient had a decrease in food intake/or appetite?: No Does the patient have dental problems?: No Does the patient have eating habits or behaviors  that may be indicators of an eating disorder including binging or inducing vomiting?: No Has the patient recently lost weight without trying?: 0 Has the patient been eating poorly because of a decreased appetite?: 0 Malnutrition Screening Tool Score: 0    Physical Exam ROS  Blood pressure 122/83, pulse (!) 116, temperature 98.2 F (36.8 C), temperature source Oral, resp. rate 18, SpO2 99%. There is no height or weight on file to calculate BMI.  Past Psychiatric History: Bipolar, suicide ideation, MDD, GAD, polysubstance abuse  Is the patient at risk to self? Yes  Has the patient been a risk to self in the past 6 months? Yes .    Has the patient been a risk to self within the distant past? Yes   Is the patient a risk to others? Yes   Has the patient been a risk to others in the past 6 months? Yes   Has the patient been a risk to others within the distant past? Yes   Past Medical History: see chart   Family History: unknown   Social History: Polysubstance abuse  Last Labs:  Admission on 08/05/2023, Discharged on 08/05/2023  Component Date Value Ref Range Status   Specimen Description 08/05/2023 BLOOD RIGHT ANTECUBITAL   Final   Special Requests 08/05/2023 AEROBIC BOTTLE ONLY Blood Culture results may not be optimal due to an inadequate volume of blood received in culture bottles   Final   Culture 08/05/2023    Final                   Value:NO GROWTH 3 DAYS Performed at [facility], [address], [place], [place] [address]    Report Status 08/05/2023 PENDING   Incomplete   Specimen Description 08/05/2023 BLOOD RIGHT FOREARM   Final   Special Requests 08/05/2023 AEROBIC BOTTLE ONLY Blood Culture results may not be optimal due to an inadequate volume of blood received in culture bottles   Final   Culture 08/05/2023    Final                   Value:NO GROWTH 3 DAYS Performed at [facility], [address], [place], [place] [address]    Report Status 08/05/2023  PENDING   Incomplete   Lactic Acid, Venous 08/05/2023 1.2  0.5 - 1.9 mmol/L Final   WBC 08/05/2023 4.0  4.0 - 10.5 K/uL Final   RBC 08/05/2023 3.51 (L)  4.22 - 5.81 MIL/uL Final   Hemoglobin 08/05/2023 12.0 (L)  13.0 - 17.0 g/dL Final   HCT 13/12/6576 35.3 (L)  39.0 - 52.0 % Final   MCV 08/05/2023 100.6 (H)  80.0 - 100.0 fL Final   MCH 08/05/2023 34.2 (H)  26.0 - 34.0 pg Final   MCHC 08/05/2023 34.0  30.0 - 36.0 g/dL Final   RDW 46/96/2952 12.6  11.5 - 15.5 % Final   Platelets 08/05/2023 254  150 - 400 K/uL Final   nRBC 08/05/2023 0.0  0.0 - 0.2 % Final   Performed at [facility], [address], [place], [place] [address]   Sodium 08/05/2023 135  135 - 145 mmol/L Final   Potassium 08/05/2023 4.2  3.5 -  5.1 mmol/L Final   Chloride 08/05/2023 104  98 - 111 mmol/L Final   CO2 08/05/2023 22  22 - 32 mmol/L Final   Glucose, Bld 08/05/2023 93  70 - 99 mg/dL Final   Glucose reference range applies only to samples taken after fasting for at least 8 hours.   BUN 08/05/2023 18  6 - 20 mg/dL Final   Creatinine, Ser 08/05/2023 1.10  0.61 - 1.24 mg/dL Final   Calcium 93/23/5573 9.2  8.9 - 10.3 mg/dL Final   GFR, Estimated 08/05/2023 >60  >60 mL/min Final   Comment: (NOTE) Calculated using the CKD-EPI Creatinine Equation (2021)    Anion gap 08/05/2023 9  5 - 15 Final   Performed at [facility], [address], [place], [place] [address]  Admission on 07/28/2023, Discharged on 08/03/2023  Component Date Value Ref Range Status   Lipase 07/28/2023 33  11 - 51 U/L Final   Performed at [facility], [address], [place], [place] [address]   Sodium 07/28/2023 136  135 - 145 mmol/L Final   Potassium 07/28/2023 3.4 (L)  3.5 - 5.1 mmol/L Final   Chloride 07/28/2023 105  98 - 111 mmol/L Final   CO2 07/28/2023 21 (L)  22 - 32 mmol/L Final   Glucose, Bld 07/28/2023 96  70 - 99 mg/dL Final   Glucose reference range applies only to samples taken after fasting for at least 8 hours.    BUN 07/28/2023 13  6 - 20 mg/dL Final   Creatinine, Ser 07/28/2023 0.96  0.61 - 1.24 mg/dL Final   Calcium 23/76/2831 9.1  8.9 - 10.3 mg/dL Final   Total Protein 51/76/1607 7.8  6.5 - 8.1 g/dL Final   Albumin 37/02/6268 3.7  3.5 - 5.0 g/dL Final   AST 48/54/6270 20  15 - 41 U/L Final   ALT 07/28/2023 11  0 - 44 U/L Final   Alkaline Phosphatase 07/28/2023 50  38 - 126 U/L Final   Total Bilirubin 07/28/2023 0.6  0.0 - 1.2 mg/dL Final   GFR, Estimated 07/28/2023 >60  >60 mL/min Final   Comment: (NOTE) Calculated using the CKD-EPI Creatinine Equation (2021)    Anion gap 07/28/2023 10  5 - 15 Final   Performed at [facility], [address] [place], [place] [address]   WBC 07/28/2023 3.2 (L)  4.0 - 10.5 K/uL Final   RBC 07/28/2023 3.73 (L)  4.22 - 5.81 MIL/uL Final   Hemoglobin 07/28/2023 12.8 (L)  13.0 - 17.0 g/dL Final   HCT 38/18/2993 38.8 (L)  39.0 - 52.0 % Final   MCV 07/28/2023 104.0 (H)  80.0 - 100.0 fL Final   MCH 07/28/2023 34.3 (H)  26.0 - 34.0 pg Final   MCHC 07/28/2023 33.0  30.0 - 36.0 g/dL Final   RDW 71/69/6789 12.7  11.5 - 15.5 % Final   Platelets 07/28/2023 279  150 - 400 K/uL Final   nRBC 07/28/2023 0.0  0.0 - 0.2 % Final   Performed at [facility], [address], [place], [place] [address]   Color, Urine 07/28/2023 YELLOW  YELLOW Final   APPearance 07/28/2023 CLEAR  CLEAR Final   Specific Gravity, Urine 07/28/2023 1.029  1.005 - 1.030 Final   pH 07/28/2023 5.0  5.0 - 8.0 Final   Glucose, UA 07/28/2023 NEGATIVE  NEGATIVE mg/dL Final   Hgb urine dipstick 07/28/2023 NEGATIVE  NEGATIVE Final   Bilirubin Urine 07/28/2023 NEGATIVE  NEGATIVE Final   Ketones, ur 07/28/2023 NEGATIVE  NEGATIVE mg/dL Final   Protein, ur 98/03/9146 30 (A)  NEGATIVE mg/dL Final   Nitrite 82/95/6213 NEGATIVE  NEGATIVE Final   Leukocytes,Ua 07/28/2023 NEGATIVE  NEGATIVE Final   RBC / HPF 07/28/2023 0-5  0 - 5 RBC/hpf Final   WBC, UA 07/28/2023 6-10  0 - 5 WBC/hpf Final    Bacteria, UA 07/28/2023 RARE (A)  NONE SEEN Final   Squamous Epithelial / HPF 07/28/2023 0-5  0 - 5 /HPF Final   Mucus 07/28/2023 PRESENT   Final   Sperm, UA 07/28/2023 PRESENT   Final   Performed at [facility], [address] [place], [place], [place] [address]   SARS Coronavirus 2 by RT PCR 07/28/2023 NEGATIVE  NEGATIVE Final   Influenza A by PCR 07/28/2023 NEGATIVE  NEGATIVE Final   Influenza B by PCR 07/28/2023 NEGATIVE  NEGATIVE Final   Comment: (NOTE) The Xpert Xpress SARS-CoV-2/FLU/RSV plus assay is intended as an aid in the diagnosis of influenza from Nasopharyngeal swab specimens and should not be used as a sole basis for treatment. Nasal washings and aspirates are unacceptable for Xpert Xpress SARS-CoV-2/FLU/RSV testing.  Fact Sheet for Patients: BloggerCourse.com  Fact Sheet for Healthcare Providers: SeriousBroker.it  This test is not yet approved or cleared by the [place] FDA and has been authorized for detection and/or diagnosis of SARS-CoV-2 by FDA under an Emergency Use Authorization (EUA). This EUA will remain in effect (meaning this test can be used) for the duration of the COVID-19 declaration under Section 564(b)(1) of the Act, 21 U.S.C. section 360bbb-3(b)(1), unless the authorization is terminated or revoked.     Resp Syncytial Virus by PCR 07/28/2023 NEGATIVE  NEGATIVE Final   Comment: (NOTE) Fact Sheet for Patients: BloggerCourse.com  Fact Sheet for Healthcare Providers: SeriousBroker.it  This test is not yet approved or cleared by the [place] FDA and has been authorized for detection and/or diagnosis of SARS-CoV-2 by FDA under an Emergency Use Authorization (EUA). This EUA will remain in effect (meaning this test can be used) for the duration of the COVID-19 declaration under Section 564(b)(1) of the Act, 21 U.S.C. section 360bbb-3(b)(1),  unless the authorization is terminated or revoked.  Performed at [facility], [address], [place], [place] [address]    Neisseria Gonorrhea 07/28/2023 Negative   Final   Chlamydia 07/28/2023 Negative   Final   Comment 07/28/2023 Normal Reference Ranger Chlamydia - Negative   Final   Comment 07/28/2023 Normal Reference Range Neisseria Gonorrhea - Negative   Final   Glucose, CSF 07/29/2023 63  40 - 70 mg/dL Final   Total  Protein, CSF 07/29/2023 21  15 - 45 mg/dL Final   Performed at [facility], [address] [place], [place] [address]   Specimen Description 07/29/2023 CSF   Final   Special Requests 07/29/2023 Immunocompromised   Final   Gram Stain 07/29/2023    Final                   Value:CYTOSPIN SMEAR WBC PRESENT, PREDOMINANTLY MONONUCLEAR NO ORGANISMS SEEN    Culture 07/29/2023    Final                   Value:NO GROWTH 3 DAYS Performed at [facility], [address] [place], [place] [address]    Report Status 07/29/2023 08/01/2023 FINAL   Final   Cryptococcus neoformans/gattii (CS* 07/29/2023 NOT DETECTED  NOT DETECTED Final   Comment: (NOTE) Patients with a suspicion of cryptococcal meningitis should be tested  for cryptococcal antigen (CrAg).      Cytomegalovirus (CSF) 07/29/2023 NOT DETECTED  NOT DETECTED Final   Enterovirus (CSF) 07/29/2023 NOT DETECTED  NOT DETECTED Final   Escherichia coli K1 (CSF) 07/29/2023 NOT DETECTED  NOT DETECTED Final   Comment: (NOTE) Only E. coli strains possessing the K1 capsular antigen will be detected.      Haemophilus influenzae (CSF) 07/29/2023 NOT DETECTED  NOT DETECTED Final   Herpes simplex virus 1 (CSF) 07/29/2023 NOT DETECTED  NOT DETECTED Final   Herpes simplex virus 2 (CSF) 07/29/2023 NOT DETECTED  NOT DETECTED Final   Human herpesvirus 6 (CSF) 07/29/2023 NOT DETECTED  NOT DETECTED Final   Human parechovirus (CSF) 07/29/2023 NOT DETECTED  NOT DETECTED Final   Listeria monocytogenes (CSF)  07/29/2023 NOT DETECTED  NOT DETECTED Final   Neisseria meningitis (CSF) 07/29/2023 NOT DETECTED  NOT DETECTED Final   Comment: (NOTE) Only encapsulated strains of N. meningitidis will be detected.     Streptococcus agalactiae (CSF) 07/29/2023 NOT DETECTED  NOT DETECTED Final   Streptococcus pneumoniae (CSF) 07/29/2023 NOT DETECTED  NOT DETECTED Final   Varicella zoster virus (CSF) 07/29/2023 NOT DETECTED  NOT DETECTED Final   Performed at [facility], [address], [place], [place] [address]   Fungus Stain 07/29/2023 Final report   Final   Comment: (NOTE) Performed At: [facility] [address] [place], [place] [id number] Jolene Schimke MD UX:[id number]    Fungus (Mycology) Culture 07/29/2023 PENDING   Incomplete   Fungal Source 07/29/2023 CSF   Final   Performed at [facility], [address], [place], [place] [address]   VDRL Quant, CSF 07/29/2023 Non Reactive  Non Rea:<1:1 Final   Comment: (NOTE) Performed At: [facility] [address] [place], [place] [id number] Jolene Schimke MD DG:[id number]    JC Virus PCR, CSF 07/29/2023 Negative  Negative Final   Comment: (NOTE) No JCV DNA detected This test was developed and its performance characteristics determined by [facility].  It has not been cleared or approved by the Food and Drug Administration.  The FDA has determined that such clearance or approval is not necessary. Performed At: [facility] [address] [place], [place] [id number] Jolene Schimke MD YT:[id number]    EBV VCA IgM 07/29/2023 <36.0  0.0 - 35.9 U/mL Final   Comment: (NOTE)                                 Negative        <36.0                                 Equivocal 36.0 - 43.9                                 Positive        >43.9 Performed At: [facility] [address] [place], [place] [id number] Jolene Schimke MD [place]:[id number]    CMV DNA Quant 07/29/2023 Negative  Negative IU/mL Final   Comment:  (NOTE) No CMV DNA detected. The quantitative range of this assay is 200 to 1 million IU/mL. Performed At: [facility] [address] [place], [place] [id number] Clovis Riley  Claudie Fisherman MD ZO:[id number]    Log10 CMV Qn DNA Pl 07/29/2023 UNABLE TO CALCULATE  log10 IU/mL Final   Comment: (NOTE) Unable to calculate result since non-numeric result obtained for component test.    Crypto Ag 07/29/2023 NEGATIVE  NEGATIVE Final   Cryptococcal Ag Titer 07/29/2023 NOT INDICATED  NOT INDICATED Final   Performed at [facility], [address], [place], [place] [address]   Tube # 07/29/2023 1   Corrected   CORRECTED ON 02/28 AT 1051: PREVIOUSLY REPORTED AS 4   Color, CSF 07/29/2023 PINK (A)  COLORLESS Final   Appearance, CSF 07/29/2023 HAZY (A)  CLEAR Final   Supernatant 07/29/2023 COLORLESS   Final   RBC Count, CSF 07/29/2023 2,000 (H)  0 /cu mm Final   WBC, CSF 07/29/2023 12 (HH)  0 - 5 /cu mm Final   Comment: CRITICAL RESULT CALLED TO, READ BACK BY AND VERIFIED WITH: K CORUM RN 07/29/23 @0152  BY J. WHITE    Segmented Neutrophils-CSF 07/29/2023 3  0 - 6 % Final   Lymphs, CSF 07/29/2023 91 (H)  40 - 80 % Final   Monocyte-Macrophage-Spinal Fluid 07/29/2023 6 (L)  15 - 45 % Final   Performed at [facility], [address] [place], [place] [address]   Tube # 07/29/2023 4   Corrected   CORRECTED ON 02/28 AT 1052: PREVIOUSLY REPORTED AS 1   Color, CSF 07/29/2023 COLORLESS  COLORLESS Final   Appearance, CSF 07/29/2023 CLEAR  CLEAR Final   Supernatant 07/29/2023 NOT INDICATED   Final   RBC Count, CSF 07/29/2023 0  0 /cu mm Final   WBC, CSF 07/29/2023 6 (H)  0 - 5 /cu mm Final   Other Cells, CSF 07/29/2023 TOO FEW TO COUNT, SMEAR AVAILABLE FOR REVIEW   Final   Comment: MANY LYMPHS, FEW MONOS Performed at [facility], [address], [place], [place] [address]    WBC 07/29/2023 3.0 (L)  4.0 - 10.5 K/uL Final   RBC 07/29/2023 3.51 (L)  4.22 - 5.81 MIL/uL Final   Hemoglobin  07/29/2023 12.1 (L)  13.0 - 17.0 g/dL Final   HCT 08/65/7846 36.3 (L)  39.0 - 52.0 % Final   MCV 07/29/2023 103.4 (H)  80.0 - 100.0 fL Final   MCH 07/29/2023 34.5 (H)  26.0 - 34.0 pg Final   MCHC 07/29/2023 33.3  30.0 - 36.0 g/dL Final   RDW 96/29/5284 12.8  11.5 - 15.5 % Final   Platelets 07/29/2023 251  150 - 400 K/uL Final   nRBC 07/29/2023 0.0  0.0 - 0.2 % Final   Performed at [facility], [address], [place], [place] [address]   Sodium 07/29/2023 140  135 - 145 mmol/L Final   Potassium 07/29/2023 3.3 (L)  3.5 - 5.1 mmol/L Final   Chloride 07/29/2023 106  98 - 111 mmol/L Final   CO2 07/29/2023 24  22 - 32 mmol/L Final   Glucose, Bld 07/29/2023 115 (H)  70 - 99 mg/dL Final   Glucose reference range applies only to samples taken after fasting for at least 8 hours.   BUN 07/29/2023 12  6 - 20 mg/dL Final   Creatinine, Ser 07/29/2023 1.02  0.61 - 1.24 mg/dL Final   Calcium 06/02/7251 8.6 (L)  8.9 - 10.3 mg/dL Final   Total Protein 66/44/0347 7.1  6.5 - 8.1 g/dL Final   Albumin 42/59/5638 3.0 (L)  3.5 - 5.0 g/dL Final  AST 07/29/2023 18  15 - 41 U/L Final   ALT 07/29/2023 10  0 - 44 U/L Final   Alkaline Phosphatase 07/29/2023 50  38 - 126 U/L Final   Total Bilirubin 07/29/2023 0.5  0.0 - 1.2 mg/dL Final   GFR, Estimated 07/29/2023 >60  >60 mL/min Final   Comment: (NOTE) Calculated using the CKD-EPI Creatinine Equation (2021)    Anion gap 07/29/2023 10  5 - 15 Final   Performed at [facility] Lab, [address], [place], [place] [address]   HIV 1 RNA Quant 07/29/2023 196,000  copies/mL Corrected   Comment: (NOTE) The reportable range for this assay is 20 to 10,000,000 copies HIV-1 RNA/mL.    LOG10 HIV-1 RNA 07/29/2023 5.292  log10copy/mL Final   Comment: (NOTE) Performed At: [facility] [address] [place], [place] [id number] Jolene Schimke MD XB:[id number]    Neutrophils Relative % 07/29/2023 46  % Final   Neutro Abs 07/29/2023 1.4 (L)  1.7 - 7.7  K/uL Final   Lymphocytes Relative 07/29/2023 30  % Final   Lymphs Abs 07/29/2023 0.9  0.7 - 4.0 K/uL Final   Monocytes Relative 07/29/2023 13  % Final   Monocytes Absolute 07/29/2023 0.4  0.1 - 1.0 K/uL Final   Eosinophils Relative 07/29/2023 10  % Final   Eosinophils Absolute 07/29/2023 0.3  0.0 - 0.5 K/uL Final   Basophils Relative 07/29/2023 0  % Final   Basophils Absolute 07/29/2023 0.0  0.0 - 0.1 K/uL Final   Immature Granulocytes 07/29/2023 1  % Final   Abs Immature Granulocytes 07/29/2023 0.03  0.00 - 0.07 K/uL Final   Performed at [facility], [address], [place], [place] [address]   Opiates 07/29/2023 NONE DETECTED  NONE DETECTED Final   Cocaine 07/29/2023 POSITIVE (A)  NONE DETECTED Final   Benzodiazepines 07/29/2023 POSITIVE (A)  NONE DETECTED Final   Amphetamines 07/29/2023 NONE DETECTED  NONE DETECTED Final   Tetrahydrocannabinol 07/29/2023 POSITIVE (A)  NONE DETECTED Final   Barbiturates 07/29/2023 NONE DETECTED  NONE DETECTED Final   Comment: (NOTE) DRUG SCREEN FOR MEDICAL PURPOSES ONLY.  IF CONFIRMATION IS NEEDED FOR ANY PURPOSE, NOTIFY LAB WITHIN 5 DAYS.  LOWEST DETECTABLE LIMITS FOR URINE DRUG SCREEN Drug Class                     Cutoff (ng/mL) Amphetamine and metabolites    1000 Barbiturate and metabolites    200 Benzodiazepine                 200 Opiates and metabolites        300 Cocaine and metabolites        300 THC                            50 Performed at [facility] Lab, [address], [place], [place] [address]    CD4 T Cell Abs 07/29/2023 171 (L)  400 - 1,790 /uL Final   CD4 % Helper T Cell 07/29/2023 14 (L)  33 - 65 % Final   Performed at [facility], [address], [place], [place] [address]   EBV DNA QN by PCR 07/29/2023 Negative  Negative IU/mL Final   Comment: (NOTE) No EBV DNA detected. The linear range of this assay is 35 - 100,000,000 IU/mL Performed At: [facility] [address]  [place], [place] [id number] Jolene Schimke  MD OZ:[id number]    RPR Ser Ql 07/29/2023 Reactive (A)  NON REACTIVE Final   SENT FOR CONFIRMATION   RPR Titer 07/29/2023 >=1:256   Final   Performed at [facility], [address], [place], [place] [address]   Path Review 07/29/2023 Negative for malignancy.  Predominantly red blood cells with scattered rare lymphocytes and monocytes.   Final   Comment: Reviewed by Lily Lovings. Picklesimer, M.D. 07/29/2023 Performed at [facility], [address] [place], [place] [address]    T Pallidum Abs 07/29/2023 Reactive (A)  Non Reactive Final   Comment: (NOTE) Performed At: [facility] [address] [place], [place] [id number] Jolene Schimke MD DG:[id number]    Histoplasma Antigen, urine 07/31/2023 Negative  <0.2 ng/mL Final   Comment: (NOTE) Performed At: [facility] [address] [place], [place] [id number] Jolene Schimke MD PP:[id number]    Sodium 07/31/2023 136  135 - 145 mmol/L Final   Potassium 07/31/2023 4.1  3.5 - 5.1 mmol/L Final   HEMOLYSIS AT THIS LEVEL MAY AFFECT RESULT   Chloride 07/31/2023 105  98 - 111 mmol/L Final   CO2 07/31/2023 22  22 - 32 mmol/L Final   Glucose, Bld 07/31/2023 106 (H)  70 - 99 mg/dL Final   Glucose reference range applies only to samples taken after fasting for at least 8 hours.   BUN 07/31/2023 13  6 - 20 mg/dL Final   Creatinine, Ser 07/31/2023 1.13  0.61 - 1.24 mg/dL Final   Calcium 11/28/1599 8.9  8.9 - 10.3 mg/dL Final   GFR, Estimated 07/31/2023 >60  >60 mL/min Final   Comment: (NOTE) Calculated using the CKD-EPI Creatinine Equation (2021)    Anion gap 07/31/2023 9  5 - 15 Final   Performed at [facility] Lab, [address], [place], [place] [address]   Magnesium 07/31/2023 1.7  1.7 - 2.4 mg/dL Final   Performed at [facility], [address], [place], [place] [address]   WBC 07/31/2023 2.5 (L)  4.0 - 10.5 K/uL Final   RBC 07/31/2023 3.66 (L)  4.22 - 5.81  MIL/uL Final   Hemoglobin 07/31/2023 12.5 (L)  13.0 - 17.0 g/dL Final   HCT 20/25/4270 37.1 (L)  39.0 - 52.0 % Final   MCV 07/31/2023 101.4 (H)  80.0 - 100.0 fL Final   MCH 07/31/2023 34.2 (H)  26.0 - 34.0 pg Final   MCHC 07/31/2023 33.7  30.0 - 36.0 g/dL Final   RDW 62/37/6283 12.0  11.5 - 15.5 % Final   Platelets 07/31/2023 254  150 - 400 K/uL Final   nRBC 07/31/2023 0.0  0.0 - 0.2 % Final   Neutrophils Relative % 07/31/2023 22  % Final   Neutro Abs 07/31/2023 0.6 (L)  1.7 - 7.7 K/uL Final   Lymphocytes Relative 07/31/2023 51  % Final   Lymphs Abs 07/31/2023 1.3  0.7 - 4.0 K/uL Final   Monocytes Relative 07/31/2023 16  % Final   Monocytes Absolute 07/31/2023 0.4  0.1 - 1.0 K/uL Final   Eosinophils Relative 07/31/2023 8  % Final   Eosinophils Absolute 07/31/2023 0.2  0.0 - 0.5 K/uL Final   Basophils Relative 07/31/2023 1  % Final   Basophils Absolute 07/31/2023 0.0  0.0 - 0.1 K/uL Final   Immature Granulocytes 07/31/2023 2  % Final   Abs Immature Granulocytes 07/31/2023 0.05  0.00 - 0.07 K/uL Final   Performed at [facility], [address] [place], [place] [address]   Result 1 07/29/2023 Comment   Final   Comment: (NOTE) KOH/Calcofluor preparation:  no fungus observed. Performed At: [facility] [address] [place], [place] [id number] Jolene Schimke MD XB:[id number]   Admission on 03/11/2023, Discharged on 03/11/2023  Component Date Value Ref Range Status   Troponin I (High Sensitivity) 03/11/2023 5  <18 ng/L Final   Comment: (NOTE) Elevated high sensitivity troponin I (hsTnI) values and significant  changes across serial measurements may suggest ACS but many other  chronic and acute conditions are known to elevate hsTnI results.  Refer to the "Links" section for chest pain algorithms and additional  guidance. Performed at [facility], [address], [place], [place] [address]    CD4 T Cell Abs 03/11/2023 205 (L)  400 - 1,790 /uL Final   CD4 %  Helper T Cell 03/11/2023 15 (L)  33 - 65 % Final   Performed at [facility], [address], [place], [place] [address]   HIV 1 RNA Quant 03/11/2023 10,700  copies/mL Corrected   Comment: (NOTE) The reportable range for this assay is 20 to 10,000,000 copies HIV-1 RNA/mL.    LOG10 HIV-1 RNA 03/11/2023 4.029  log10copy/mL Final   Comment: (NOTE) Performed At: [facility] [address] [place], [place] [id number] Jolene Schimke MD KG:[id number]    RPR Ser Ql 03/11/2023 Reactive (A)  NON REACTIVE Final   SENT FOR CONFIRMATION   RPR Titer 03/11/2023 1:32   Final   Performed at [facility], [address] [place], [place] [address]   T Pallidum Abs 03/11/2023 Reactive (A)  Non Reactive Final   Comment: (NOTE) Performed At: [facility] [address] [place], [place] [id number] Jolene Schimke MD OV:[id number]   Admission on 03/11/2023, Discharged on 03/11/2023  Component Date Value Ref Range Status   Sodium 03/11/2023 137  135 - 145 mmol/L Final   Potassium 03/11/2023 3.7  3.5 - 5.1 mmol/L Final   Chloride 03/11/2023 104  98 - 111 mmol/L Final   CO2 03/11/2023 25  22 - 32 mmol/L Final   Glucose, Bld 03/11/2023 112 (H)  70 - 99 mg/dL Final   Glucose reference range applies only to samples taken after fasting for at least 8 hours.   BUN 03/11/2023 18  6 - 20 mg/dL Final   Creatinine, Ser 03/11/2023 1.35 (H)  0.61 - 1.24 mg/dL Final   Calcium 84/16/6063 8.6 (L)  8.9 - 10.3 mg/dL Final   Total Protein 01/60/1093 7.3  6.5 - 8.1 g/dL Final   Albumin 23/55/7322 3.6  3.5 - 5.0 g/dL Final   AST 02/54/2706 21  15 - 41 U/L Final   ALT 03/11/2023 10  0 - 44 U/L Final   Alkaline Phosphatase 03/11/2023 54  38 - 126 U/L Final   Total Bilirubin 03/11/2023 0.6  0.3 - 1.2 mg/dL Final   GFR, Estimated 03/11/2023 >60  >60 mL/min Final   Comment: (NOTE) Calculated using the CKD-EPI Creatinine Equation (2021)    Anion gap 03/11/2023 8  5 - 15 Final    Performed at [facility], [address], [place], [place] [address]   WBC 03/11/2023 3.7 (L)  4.0 - 10.5 K/uL Final   RBC 03/11/2023 3.22 (L)  4.22 - 5.81 MIL/uL Final   Hemoglobin 03/11/2023 11.0 (L)  13.0 - 17.0 g/dL Final   HCT 83/15/1761 33.6 (L)  39.0 - 52.0 % Final  MCV 03/11/2023 104.3 (H)  80.0 - 100.0 fL Final   MCH 03/11/2023 34.2 (H)  26.0 - 34.0 pg Final   MCHC 03/11/2023 32.7  30.0 - 36.0 g/dL Final   RDW 16/03/9603 12.6  11.5 - 15.5 % Final   Platelets 03/11/2023 218  150 - 400 K/uL Final   nRBC 03/11/2023 0.0  0.0 - 0.2 % Final   Neutrophils Relative % 03/11/2023 44  % Final   Neutro Abs 03/11/2023 1.7  1.7 - 7.7 K/uL Final   Lymphocytes Relative 03/11/2023 42  % Final   Lymphs Abs 03/11/2023 1.5  0.7 - 4.0 K/uL Final   Monocytes Relative 03/11/2023 7  % Final   Monocytes Absolute 03/11/2023 0.2  0.1 - 1.0 K/uL Final   Eosinophils Relative 03/11/2023 6  % Final   Eosinophils Absolute 03/11/2023 0.2  0.0 - 0.5 K/uL Final   Basophils Relative 03/11/2023 1  % Final   Basophils Absolute 03/11/2023 0.0  0.0 - 0.1 K/uL Final   Immature Granulocytes 03/11/2023 0  % Final   Abs Immature Granulocytes 03/11/2023 0.01  0.00 - 0.07 K/uL Final   Performed at [facility], [address] [place], [place] [address]    Allergies: Fish allergy and Shellfish allergy  Medications:  PTA Medications  Medication Sig   penicillin G IVPB Inject 24 Million Units into the vein daily for 9 days. As a continuous infusion. Indication:  Neurosyphilis  First Dose: Yes Last Day of Therapy:  08/11/23 Labs - Once weekly:  CBC/D and BMP, Labs - Once weekly: ESR and CRP Method of administration: Elastomeric (Continuous infusion) Method of administration may be changed at the discretion of home infusion pharmacist based upon assessment of the patient and/or caregiver's ability to self-administer the medication ordered.   bictegravir-emtricitabine-tenofovir AF  (BIKTARVY) 50-200-25 MG TABS tablet Take 1 tablet by mouth daily.   methocarbamol (ROBAXIN) 500 MG tablet Take 1 tablet (500 mg total) by mouth every 6 (six) hours as needed for muscle spasms.   QUEtiapine (SEROQUEL) 50 MG tablet Take 1 tablet (50 mg total) by mouth at bedtime.   sulfamethoxazole-trimethoprim (BACTRIM) 400-80 MG tablet Take 1 tablet by mouth daily.   terbinafine (LAMISIL) 1 % cream Apply topically 2 (two) times daily.   doxycycline (VIBRAMYCIN) 100 MG capsule Take 2 capsules (200 mg total) by mouth 2 (two) times daily for 28 days.      Medical Decision Making  Inpatient observation      Recommendations  Based on my evaluation the patient does not appear to have an emergency medical condition.  [name], NP 08/08/23  7:57 PM
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
## 2023-08-08 NOTE — ED Notes (Signed)
 Patient has been brought on unit and familiarized with unit, will continue to monitor patient for safety

## 2023-08-09 ENCOUNTER — Encounter: Payer: Self-pay | Admitting: Psychiatry

## 2023-08-09 ENCOUNTER — Ambulatory Visit (HOSPITAL_COMMUNITY): Admitting: Psychiatry

## 2023-08-09 ENCOUNTER — Inpatient Hospital Stay
Admission: AD | Admit: 2023-08-09 | Discharge: 2023-08-11 | DRG: 885 | Disposition: A | Discharge status: Other Acute Inpatient Hospital | Payer: MEDICAID | Source: Intra-hospital | Attending: Psychiatry | Admitting: Psychiatry

## 2023-08-09 ENCOUNTER — Other Ambulatory Visit: Payer: Self-pay

## 2023-08-09 DIAGNOSIS — Z818 Family history of other mental and behavioral disorders: Secondary | ICD-10-CM

## 2023-08-09 DIAGNOSIS — Z5986 Financial insecurity: Secondary | ICD-10-CM | POA: Diagnosis not present

## 2023-08-09 DIAGNOSIS — F419 Anxiety disorder, unspecified: Secondary | ICD-10-CM | POA: Diagnosis present

## 2023-08-09 DIAGNOSIS — F331 Major depressive disorder, recurrent, moderate: Secondary | ICD-10-CM | POA: Diagnosis present

## 2023-08-09 DIAGNOSIS — Z716 Tobacco abuse counseling: Secondary | ICD-10-CM

## 2023-08-09 DIAGNOSIS — Z79899 Other long term (current) drug therapy: Secondary | ICD-10-CM

## 2023-08-09 DIAGNOSIS — Z8261 Family history of arthritis: Secondary | ICD-10-CM | POA: Diagnosis not present

## 2023-08-09 DIAGNOSIS — Z5982 Transportation insecurity: Secondary | ICD-10-CM

## 2023-08-09 DIAGNOSIS — B20 Human immunodeficiency virus [HIV] disease: Secondary | ICD-10-CM | POA: Diagnosis present

## 2023-08-09 DIAGNOSIS — F1410 Cocaine abuse, uncomplicated: Secondary | ICD-10-CM | POA: Diagnosis present

## 2023-08-09 DIAGNOSIS — Z604 Social exclusion and rejection: Secondary | ICD-10-CM | POA: Diagnosis present

## 2023-08-09 DIAGNOSIS — Z8619 Personal history of other infectious and parasitic diseases: Secondary | ICD-10-CM | POA: Diagnosis present

## 2023-08-09 DIAGNOSIS — Z833 Family history of diabetes mellitus: Secondary | ICD-10-CM | POA: Diagnosis not present

## 2023-08-09 DIAGNOSIS — F321 Major depressive disorder, single episode, moderate: Secondary | ICD-10-CM | POA: Diagnosis not present

## 2023-08-09 DIAGNOSIS — R200 Anesthesia of skin: Secondary | ICD-10-CM | POA: Diagnosis present

## 2023-08-09 DIAGNOSIS — Z5900 Homelessness unspecified: Secondary | ICD-10-CM

## 2023-08-09 DIAGNOSIS — F17210 Nicotine dependence, cigarettes, uncomplicated: Secondary | ICD-10-CM | POA: Diagnosis present

## 2023-08-09 DIAGNOSIS — Z91148 Patient's other noncompliance with medication regimen for other reason: Secondary | ICD-10-CM

## 2023-08-09 DIAGNOSIS — F1910 Other psychoactive substance abuse, uncomplicated: Secondary | ICD-10-CM | POA: Diagnosis not present

## 2023-08-09 DIAGNOSIS — R45851 Suicidal ideations: Secondary | ICD-10-CM

## 2023-08-09 DIAGNOSIS — Z91013 Allergy to seafood: Secondary | ICD-10-CM

## 2023-08-09 DIAGNOSIS — Z8249 Family history of ischemic heart disease and other diseases of the circulatory system: Secondary | ICD-10-CM | POA: Diagnosis not present

## 2023-08-09 DIAGNOSIS — F339 Major depressive disorder, recurrent, unspecified: Secondary | ICD-10-CM | POA: Diagnosis not present

## 2023-08-09 DIAGNOSIS — F1510 Other stimulant abuse, uncomplicated: Secondary | ICD-10-CM | POA: Diagnosis present

## 2023-08-09 DIAGNOSIS — Z9151 Personal history of suicidal behavior: Secondary | ICD-10-CM

## 2023-08-09 DIAGNOSIS — A523 Neurosyphilis, unspecified: Secondary | ICD-10-CM | POA: Diagnosis present

## 2023-08-09 DIAGNOSIS — Z5948 Other specified lack of adequate food: Secondary | ICD-10-CM

## 2023-08-09 DIAGNOSIS — Z62810 Personal history of physical and sexual abuse in childhood: Secondary | ICD-10-CM

## 2023-08-09 DIAGNOSIS — Z5941 Food insecurity: Secondary | ICD-10-CM

## 2023-08-09 DIAGNOSIS — F1290 Cannabis use, unspecified, uncomplicated: Secondary | ICD-10-CM | POA: Diagnosis present

## 2023-08-09 DIAGNOSIS — R269 Unspecified abnormalities of gait and mobility: Secondary | ICD-10-CM | POA: Diagnosis present

## 2023-08-09 HISTORY — DX: Depression, unspecified: F32.A

## 2023-08-09 HISTORY — DX: Unspecified psychosis not due to a substance or known physiological condition: F29

## 2023-08-09 HISTORY — DX: Other psychoactive substance abuse, uncomplicated: F19.10

## 2023-08-09 MED ORDER — OLANZAPINE 5 MG PO TBDP: 5.0000 mg | ORAL_TABLET | Freq: Three times a day (TID) | ORAL | Status: DC | PRN

## 2023-08-09 MED ORDER — DIPHENHYDRAMINE HCL 25 MG PO CAPS
50.0000 mg | ORAL_CAPSULE | Freq: Four times a day (QID) | ORAL | Status: DC | PRN
Start: 2023-08-09 — End: 2023-08-11

## 2023-08-09 MED ORDER — LORAZEPAM 2 MG PO TABS
2.0000 mg | ORAL_TABLET | Freq: Four times a day (QID) | ORAL | Status: DC | PRN
  Administered 2023-08-09: 2 mg via ORAL
  Filled 2023-08-09: qty 1

## 2023-08-09 MED ORDER — TRAZODONE HCL 100 MG PO TABS: 100.0000 mg | ORAL_TABLET | Freq: Every evening | ORAL | Status: DC | PRN

## 2023-08-09 MED ORDER — NICOTINE POLACRILEX 2 MG MT GUM
4.0000 mg | CHEWING_GUM | OROMUCOSAL | Status: DC | PRN
  Administered 2023-08-10: 4 mg via ORAL
  Filled 2023-08-09: qty 2

## 2023-08-09 MED ORDER — ONDANSETRON 4 MG PO TBDP: 4.0000 mg | ORAL_TABLET | Freq: Three times a day (TID) | ORAL | Status: DC | PRN

## 2023-08-09 MED ORDER — DOXYCYCLINE HYCLATE 100 MG PO CAPS
200.0000 mg | ORAL_CAPSULE | Freq: Two times a day (BID) | ORAL | Status: DC
Start: 2023-08-09 — End: 2023-08-09

## 2023-08-09 MED ORDER — OLANZAPINE 10 MG IM SOLR: 5.0000 mg | Freq: Three times a day (TID) | INTRAMUSCULAR | Status: DC | PRN

## 2023-08-09 MED ORDER — OLANZAPINE 10 MG IM SOLR
10.0000 mg | Freq: Three times a day (TID) | INTRAMUSCULAR | Status: DC | PRN
Start: 2023-08-09 — End: 2023-08-11

## 2023-08-09 MED ORDER — QUETIAPINE FUMARATE 25 MG PO TABS
50.0000 mg | ORAL_TABLET | Freq: Every day | ORAL | Status: DC
Start: 2023-08-10 — End: 2023-08-10

## 2023-08-09 MED ORDER — MELATONIN 5 MG PO TABS
5.0000 mg | ORAL_TABLET | Freq: Every day | ORAL | Status: DC
  Administered 2023-08-10 – 2023-08-11 (×2): 5 mg via ORAL
  Filled 2023-08-09 (×2): qty 1

## 2023-08-09 MED ORDER — BICTEGRAVIR-EMTRICITAB-TENOFOV 50-200-25 MG PO TABS: 1.0000 | ORAL_TABLET | Freq: Every day | ORAL | Status: DC

## 2023-08-09 MED ORDER — DOXYCYCLINE HYCLATE 100 MG PO TABS
200.0000 mg | ORAL_TABLET | Freq: Two times a day (BID) | ORAL | Status: DC
Start: 2023-08-09 — End: 2023-08-10
  Administered 2023-08-09 – 2023-08-10 (×4): 200 mg via ORAL
  Filled 2023-08-09 (×4): qty 2

## 2023-08-09 MED ORDER — BICTEGRAVIR-EMTRICITAB-TENOFOV 50-200-25 MG PO TABS
1.0000 | ORAL_TABLET | Freq: Every day | ORAL | Status: DC
  Administered 2023-08-09 – 2023-08-11 (×3): 1 via ORAL
  Filled 2023-08-09 (×3): qty 1

## 2023-08-09 MED ORDER — SERTRALINE HCL 25 MG PO TABS
50.0000 mg | ORAL_TABLET | Freq: Every day | ORAL | Status: DC
  Administered 2023-08-09 – 2023-08-11 (×3): 50 mg via ORAL
  Filled 2023-08-09 (×3): qty 2

## 2023-08-09 MED ORDER — METHOCARBAMOL 500 MG PO TABS: 500.0000 mg | ORAL_TABLET | Freq: Four times a day (QID) | ORAL | Status: DC | PRN

## 2023-08-09 MED ORDER — SULFAMETHOXAZOLE-TRIMETHOPRIM 400-80 MG PO TABS: 1.0000 | ORAL_TABLET | Freq: Every day | ORAL | Status: DC

## 2023-08-09 MED ORDER — SULFAMETHOXAZOLE-TRIMETHOPRIM 400-80 MG PO TABS
1.0000 | ORAL_TABLET | Freq: Every day | ORAL | Status: DC
  Administered 2023-08-09 – 2023-08-11 (×3): 1 via ORAL
  Filled 2023-08-09 (×3): qty 1

## 2023-08-09 MED ORDER — METHOCARBAMOL 500 MG PO TABS
500.0000 mg | ORAL_TABLET | Freq: Four times a day (QID) | ORAL | Status: DC | PRN
  Administered 2023-08-11: 500 mg via ORAL
  Filled 2023-08-09: qty 1

## 2023-08-09 MED ORDER — NICOTINE 14 MG/24HR TD PT24
14.0000 mg | MEDICATED_PATCH | Freq: Every day | TRANSDERMAL | Status: DC
  Administered 2023-08-09: 14 mg via TRANSDERMAL
  Filled 2023-08-09: qty 1

## 2023-08-09 NOTE — Tx Team (Signed)
 Initial Treatment Plan 08/09/2023 1:22 AM Lee Bridges ZOX:[id number]    PATIENT STRESSORS: Financial difficulties   Health problems   Substance abuse     PATIENT STRENGTHS: Average or above average intelligence  Capable of independent living  Communication skills    PATIENT IDENTIFIED PROBLEMS: Anxiety  Substance Abuse  Medication non compliance                 DISCHARGE CRITERIA:  Improved stabilization in mood, thinking, and/or behavior  PRELIMINARY DISCHARGE PLAN: Outpatient therapy  PATIENT/FAMILY INVOLVEMENT: This treatment plan has been presented to and reviewed with the patient, Lee Bridges, and/or family member, .  The patient and family have been given the opportunity to ask questions and make suggestions.  [name], RN 08/09/2023, 1:22 AM

## 2023-08-09 NOTE — Progress Notes (Signed)
   08/09/23 0700  Psych Admission Type (Psych Patients Only)  Admission Status Voluntary  Psychosocial Assessment  Patient Complaints Anxiety;Substance abuse  Eye Contact Fair  Facial Expression Anxious  Affect Anxious  Speech Slow;Logical/coherent  Interaction Assertive  Motor Activity Fidgety  Appearance/Hygiene Unremarkable  Behavior Characteristics Cooperative;Appropriate to situation  Mood Anxious  Aggressive Behavior  Effect No apparent injury  Thought Process  Coherency WDL  Content WDL  Delusions None reported or observed  Perception WDL  Hallucination None reported or observed  Judgment Limited  Confusion None  Danger to Self  Current suicidal ideation? Denies  Agreement Not to Harm Self Yes  Description of Agreement verbal  Danger to Others  Danger to Others None reported or observed

## 2023-08-09 NOTE — Plan of Care (Signed)
   Problem: Safety: Goal: Periods of time without injury will increase Outcome: Progressing

## 2023-08-09 NOTE — [facility] Counselor (Signed)
 Referral sent to [facility] on patient's behalf.     Reymundo Poll, MSW, LCSWA 08/09/2023 10:36 AM

## 2023-08-09 NOTE — Plan of Care (Signed)
 Pt new to the unit tonight, hasn't had time to progress  Problem: Education: Goal: Knowledge of [place] [facility] Education information/materials will improve Outcome: Not Progressing Goal: Emotional status will improve Outcome: Not Progressing Goal: Mental status will improve Outcome: Not Progressing Goal: Verbalization of understanding the information provided will improve Outcome: Not Progressing   Problem: Activity: Goal: Interest or engagement in activities will improve Outcome: Not Progressing Goal: Sleeping patterns will improve Outcome: Not Progressing   Problem: Coping: Goal: Ability to verbalize frustrations and anger appropriately will improve Outcome: Not Progressing Goal: Ability to demonstrate self-control will improve Outcome: Not Progressing   Problem: Health Behavior/Discharge Planning: Goal: Identification of resources available to assist in meeting health care needs will improve Outcome: Not Progressing Goal: Compliance with treatment plan for underlying cause of condition will improve Outcome: Not Progressing   Problem: Physical Regulation: Goal: Ability to maintain clinical measurements within normal limits will improve Outcome: Not Progressing   Problem: Safety: Goal: Periods of time without injury will increase Outcome: Not Progressing

## 2023-08-09 NOTE — BH Assessment (Signed)
 Patient placed on to a one to one sister. Safety rounds still being performed. Patient remains safe on the unit.

## 2023-08-09 NOTE — Progress Notes (Signed)
 Patient admitted from [facility], report received from [place], LPN. Pt calm and pleasant during assessment. Pt stated he lives with his mom and that he feels he needs better medication to help him feel more balanced during the day. Pt stated he came in with SI but denies it with this Clinical research associate. Pt oriented to the unit and his room. Pt given education, support, and encouragement to be active in his treatment plan. Pt skin assessment completed with Marylu Lund, RN, no contraband found. Pt has several tattoos scatter across his body, no abnormalities found. Pt being monitored Q 15 minutes for safety per unit protocol, remains safe on the unit

## 2023-08-09 NOTE — ED Notes (Signed)
 Byrd Hesselbach [phone] is his sister and she was called and explained to that her brother will be transferred she will like him to call and let her know if she ca visit
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
## 2023-08-09 NOTE — Progress Notes (Addendum)
   08/09/23 1400  Spiritual Encounters  Type of Visit Initial  Care provided to: Patient  Conversation partners present during encounter Nurse  Reason for visit Routine spiritual support  OnCall Visit No   Chaplain visited patient offer a compassionate presence, reflective listening and empathy. Patient shared that he has thoughts of suicidal ideation of which Nurse was aware.  Chaplain provided a Bible at the patient's request and worksheets for his entertainment.    Rev. Rana M. Earlene Plater, MDiv Chaplain Resident  [facility]

## 2023-08-09 NOTE — Group Note (Signed)
 [facility] LCSW Group Therapy Note   Group Date: 08/09/2023 Start Time: 1300 End Time: 1345  Type of Therapy/Topic:  Group Therapy:  Feelings about Diagnosis  Participation Level:  Minimal    Description of Group:    This group will allow patients to explore their thoughts and feelings about diagnoses they have received. Patients will be guided to explore their level of understanding and acceptance of these diagnoses. Facilitator will encourage patients to process their thoughts and feelings about the reactions of others to their diagnosis, and will guide patients in identifying ways to discuss their diagnosis with significant others in their lives. This group will be process-oriented, with patients participating in exploration of their own experiences as well as giving and receiving support and challenge from other group members.   Therapeutic Goals: 1. Patient will demonstrate understanding of diagnosis as evidence by identifying two or more symptoms of the disorder:  2. Patient will be able to express two feelings regarding the diagnosis 3. Patient will demonstrate ability to communicate their needs through discussion and/or role plays  Summary of Patient Progress: Patient was in group for part of the discussion prior to being pulled by psych provider. He defined diagnosis as "something that it going on with someone." During time in the room he appeared open and receptive to feedback/comments from both his peers and facilitator.    Therapeutic Modalities:   Cognitive Behavioral Therapy Brief Therapy Feelings Identification    Glenis Smoker, LCSW

## 2023-08-09 NOTE — Progress Notes (Signed)
 Remains in room, sitter at bedside for safety.  Isolative to self, no interaction with peers. Did not come out for group or snacks. Encouragement and support provided. Safety checks maintained. Patient remains safe on unit with sitter and 15 min checks.

## 2023-08-09 NOTE — Group Note (Signed)
 Date:  08/09/2023 Time:  10:02 AM  Group Topic/Focus:  Wellness Toolbox:   The focus of this group is to discuss various aspects of wellness, balancing those aspects and exploring ways to increase the ability to experience wellness.  Patients will create a wellness toolbox for use upon discharge.    Participation Level:  Did Not Attend   Lynelle Smoke [facility] 08/09/2023, 10:02 AM

## 2023-08-09 NOTE — [facility] Suicide Risk Assessment (Signed)
 [facility] INPATIENT:  Family/Significant Other Suicide Prevention Education  Suicide Prevention Education:  Education Completed; Yaron Grasse, [phone], mother, has been identified by the patient as the family member/significant other with whom the patient will be residing, and identified as the person(s) who will aid the patient in the event of a mental health crisis (suicidal ideations/suicide attempt).  With written consent from the patient, the family member/significant other has been provided the following suicide prevention education, prior to the and/or following the discharge of the patient.  The suicide prevention education provided includes the following: Suicide risk factors Suicide prevention and interventions National Suicide Hotline telephone number [facility] assessment telephone number [facility] Emergency Assistance 911 [facility] and/or Residential Mobile Crisis Unit telephone number  Request made of family/significant other to: Remove weapons (e.g., guns, rifles, knives), all items previously/currently identified as safety concern.   Remove drugs/medications (over-the-counter, prescriptions, illicit drugs), all items previously/currently identified as a safety concern.  The family member/significant other verbalizes understanding of the suicide prevention education information provided.  The family member/significant other agrees to remove the items of safety concern listed above.  Lowry Ram 08/09/2023, 1:28 PM

## 2023-08-09 NOTE — Plan of Care (Signed)
  Problem: Education: Goal: Knowledge of [place] [facility] Education information/materials will improve Outcome: Not Progressing Goal: Emotional status will improve Outcome: Not Progressing   

## 2023-08-09 NOTE — [facility] Counselor (Signed)
 CSW touched base with [employer] on patient's behalf as patient Patient completed his phone screening and was upset.   Patient told CSW that he "didn't get in." [employer] told CSW that he was declined due to being on the offender's list.   CSW communicated this to the team.   CSW team to continue to assess.    Lee Bridges, MSW, LCSWA 08/09/2023 2:30 PM;
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
## 2023-08-09 NOTE — [facility] Counselor (Signed)
 Referral sent to [facility] on patient's behalf.  CSW to continue to assess.    Reymundo Poll, MSW, LCSWA 08/09/2023 3:45 PM

## 2023-08-09 NOTE — Group Note (Signed)
 Recreation Therapy Group Note   Group Topic:Goal Setting  Group Date: 08/09/2023 Start Time: 1005 End Time: 1100 Facilitators: Rosina Lowenstein, LRT, CTRS Location:  Craft Room  Group Description: Product/process development scientist. Patients were given many different magazines, a glue stick, markers, and a piece of cardstock paper. LRT and pts discussed the importance of having goals in life. LRT and pts discussed the difference between short-term and long-term goals, as well as what a SMART goal is. LRT encouraged pts to create a vision board, with images they picked and then cut out with safety scissors from the magazine, for themselves, that capture their short and long-term goals. LRT encouraged pts to show and explain their vision board to the group.   Goal Area(s) Addressed:  Patient will gain knowledge of short vs. long term goals.  Patient will identify goals for themselves. Patient will practice setting SMART goals. Patient will verbalize their goals to LRT and peers.   Affect/Mood: N/A   Participation Level: Did not attend    Clinical Observations/Individualized Feedback: Patient did not attend group.   Plan: Continue to engage patient in RT group sessions 2-3x/week.   Rosina Lowenstein, LRT, CTRS 08/09/2023 12:16 PM
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
## 2023-08-09 NOTE — Group Note (Signed)
 Recreation Therapy Group Note   Group Topic:General Recreation  Group Date: 08/09/2023 Start Time: 1350 End Time: 1515 Facilitators: Rosina Lowenstein, LRT, CTRS Location: Courtyard  Group Description: [employer]. LRT and patients played games of basketball, drew with chalk, and played corn hole while outside in the courtyard while getting fresh air and sunlight. Music was being played in the background. LRT and peers conversed about different games they have played before, what they do in their free time and anything else that is on their minds. LRT encouraged pts to drink water after being outside, sweating and getting their heart rate up.  Goal Area(s) Addressed: Patient will build on frustration tolerance skills. Patients will partake in a competitive play game with peers. Patients will gain knowledge of new leisure interest/hobby.    Affect/Mood: Appropriate   Participation Level: Active   Participation Quality: Independent   Behavior: Appropriate   Speech/Thought Process: Coherent   Insight: Good   Judgement: Good   Modes of Intervention: Activity   Patient Response to Interventions:  Receptive   Education Outcome:  Acknowledges education   Clinical Observations/Individualized Feedback: Lee Bridges was active in their participation of session activities and group discussion.  Pt interacted well with LRT and peers duration of session.    Plan: Continue to engage patient in RT group sessions 2-3x/week.   Rosina Lowenstein, LRT, CTRS 08/09/2023 3:28 PM

## 2023-08-10 DIAGNOSIS — R45851 Suicidal ideations: Secondary | ICD-10-CM

## 2023-08-10 DIAGNOSIS — F1410 Cocaine abuse, uncomplicated: Secondary | ICD-10-CM | POA: Diagnosis not present

## 2023-08-10 DIAGNOSIS — F331 Major depressive disorder, recurrent, moderate: Principal | ICD-10-CM

## 2023-08-10 DIAGNOSIS — B20 Human immunodeficiency virus [HIV] disease: Secondary | ICD-10-CM

## 2023-08-10 DIAGNOSIS — A523 Neurosyphilis, unspecified: Secondary | ICD-10-CM | POA: Diagnosis not present

## 2023-08-10 DIAGNOSIS — F1510 Other stimulant abuse, uncomplicated: Secondary | ICD-10-CM

## 2023-08-10 LAB — BASIC METABOLIC PANEL
Anion gap: 10 (ref 5–15)
BUN: 18 mg/dL (ref 6–20)
CO2: 21 mmol/L — ABNORMAL LOW (ref 22–32)
Calcium: 9.2 mg/dL (ref 8.9–10.3)
Chloride: 105 mmol/L (ref 98–111)
Creatinine, Ser: 1.16 mg/dL (ref 0.61–1.24)
GFR, Estimated: >60 mL/min (ref >60)
Glucose, Bld: 94 mg/dL (ref 70–99)
Potassium: 4 mmol/L (ref 3.5–5.1)
Sodium: 136 mmol/L (ref 135–145)

## 2023-08-10 LAB — CULTURE, BLOOD (ROUTINE X 2)
Culture: NO GROWTH
Culture: NO GROWTH

## 2023-08-10 MED ORDER — QUETIAPINE FUMARATE 100 MG PO TABS
100.0000 mg | ORAL_TABLET | Freq: Every day | ORAL | Status: DC
  Administered 2023-08-10 – 2023-08-11 (×2): 100 mg via ORAL
  Filled 2023-08-10 (×2): qty 1

## 2023-08-10 MED ORDER — PENICILLIN G BENZATHINE 1200000 UNIT/2ML IM SUSY
2.4000 10*6.[IU] | PREFILLED_SYRINGE | INTRAMUSCULAR | Status: DC
  Administered 2023-08-10: 2.4 10*6.[IU] via INTRAMUSCULAR
  Filled 2023-08-10: qty 4

## 2023-08-10 NOTE — Progress Notes (Signed)
   08/10/23 1200  Psych Admission Type (Psych Patients Only)  Admission Status Voluntary  Psychosocial Assessment  Patient Complaints Anxiety;Substance abuse;Worrying  Eye Contact Fair  Facial Expression Anxious  Affect Anxious  Speech Logical/coherent  Interaction Assertive  Motor Activity Slow  Appearance/Hygiene Unremarkable  Behavior Characteristics Cooperative  Mood Anxious  Thought Process  Coherency WDL  Content WDL  Delusions WDL  Perception WDL  Hallucination None reported or observed  Judgment Limited  Confusion None  Danger to Self  Current suicidal ideation? Denies  Agreement Not to Harm Self Yes  Description of Agreement verbal  Danger to Others  Danger to Others None reported or observed

## 2023-08-10 NOTE — Group Note (Signed)
 [facility] LCSW Group Therapy Note   Group Date: 08/10/2023 Start Time: 1330 End Time: 1440   Type of Therapy/Topic:  Group Therapy:  Emotion Regulation  Participation Level:  Did Not Attend   Mood:  Description of Group:    The purpose of this group is to assist patients in learning to regulate negative emotions and experience positive emotions. Patients will be guided to discuss ways in which they have been vulnerable to their negative emotions. These vulnerabilities will be juxtaposed with experiences of positive emotions or situations, and patients challenged to use positive emotions to combat negative ones. Special emphasis will be placed on coping with negative emotions in conflict situations, and patients will process healthy conflict resolution skills.  Therapeutic Goals: Patient will identify two positive emotions or experiences to reflect on in order to balance out negative emotions:  Patient will label two or more emotions that they find the most difficult to experience:  Patient will be able to demonstrate positive conflict resolution skills through discussion or role plays:   Summary of Patient Progress:   Patient did not attend.     Therapeutic Modalities:   Cognitive Behavioral Therapy Feelings Identification Dialectical Behavioral Therapy   Lowry Ram, LCSW

## 2023-08-10 NOTE — BH IP Treatment Plan (Signed)
 Interdisciplinary Treatment and Diagnostic Plan Update  08/10/2023 Time of Session: 10:10 AM Lee Bridges MRN: [id number]  Principal Diagnosis: MDD (major depressive disorder), recurrent episode, moderate (HCC)  Secondary Diagnoses: Principal Problem:   MDD (major depressive disorder), recurrent episode, moderate (HCC) Active Problems:   History of syphilis   Cocaine abuse (HCC)   AIDS (acquired immune deficiency syndrome) (HCC)   Suicidal ideations   Methamphetamine abuse (HCC)   Marijuana use   Current Medications:  Current Facility-Administered Medications  Medication Dose Route Frequency Provider Last Rate Last Admin   bictegravir-emtricitabine-tenofovir AF (BIKTARVY) 50-200-25 MG per tablet 1 tablet  1 tablet Oral Daily Tingling, Stephanie, PA-C   1 tablet at 08/10/23 4098   diphenhydrAMINE (BENADRYL) capsule 50 mg  50 mg Oral Q6H PRN Tingling, Stephanie, PA-C       doxycycline (VIBRA-TABS) tablet 200 mg  200 mg Oral BID Tingling, Stephanie, PA-C   200 mg at 08/10/23 1191   LORazepam (ATIVAN) tablet 2 mg  2 mg Oral Q6H PRN Tingling, Stephanie, PA-C   2 mg at 08/09/23 1514   melatonin tablet 5 mg  5 mg Oral QHS Tingling, Stephanie, PA-C       methocarbamol (ROBAXIN) tablet 500 mg  500 mg Oral Q6H PRN Tingling, Stephanie, PA-C       nicotine polacrilex (NICORETTE) gum 4 mg  4 mg Oral PRN Tingling, Stephanie, PA-C   4 mg at 08/10/23 0829   OLANZapine (ZYPREXA) injection 10 mg  10 mg Intramuscular TID PRN Tingling, Stephanie, PA-C       OLANZapine (ZYPREXA) injection 5 mg  5 mg Intramuscular TID PRN Tingling, Stephanie, PA-C       OLANZapine zydis (ZYPREXA) disintegrating tablet 5 mg  5 mg Oral TID PRN Tingling, Stephanie, PA-C       ondansetron (ZOFRAN-ODT) disintegrating tablet 4 mg  4 mg Oral Q8H PRN Tingling, Stephanie, PA-C       QUEtiapine (SEROQUEL) tablet 50 mg  50 mg Oral QHS Tingling, Stephanie, PA-C       sertraline (ZOLOFT) tablet 50 mg  50 mg Oral Daily Tingling,  Stephanie, PA-C   50 mg at 08/10/23 4782   sulfamethoxazole-trimethoprim (BACTRIM) 400-80 MG per tablet 1 tablet  1 tablet Oral Daily Tingling, Stephanie, PA-C   1 tablet at 08/10/23 9562   PTA Medications: Medications Prior to Admission  Medication Sig Dispense Refill Last Dose/Taking   bictegravir-emtricitabine-tenofovir AF (BIKTARVY) 50-200-25 MG TABS tablet Take 1 tablet by mouth daily. 30 tablet 0    doxycycline (VIBRAMYCIN) 100 MG capsule Take 2 capsules (200 mg total) by mouth 2 (two) times daily for 28 days. 112 capsule 0    methocarbamol (ROBAXIN) 500 MG tablet Take 1 tablet (500 mg total) by mouth every 6 (six) hours as needed for muscle spasms. 20 tablet 0    penicillin G IVPB Inject 24 Million Units into the vein daily for 9 days. As a continuous infusion. Indication:  Neurosyphilis  First Dose: Yes Last Day of Therapy:  08/11/23 Labs - Once weekly:  CBC/D and BMP, Labs - Once weekly: ESR and CRP Method of administration: Elastomeric (Continuous infusion) Method of administration may be changed at the discretion of home infusion pharmacist based upon assessment of the patient and/or caregiver's ability to self-administer the medication ordered. 9 Units 0    QUEtiapine (SEROQUEL) 50 MG tablet Take 1 tablet (50 mg total) by mouth at bedtime. 30 tablet 0    sulfamethoxazole-trimethoprim (BACTRIM) 400-80 MG tablet Take 1 tablet  by mouth daily. 30 tablet 0    terbinafine (LAMISIL) 1 % cream Apply topically 2 (two) times daily.       Patient Stressors: Financial difficulties   Health problems   Substance abuse    Patient Strengths: Average or above average intelligence  Capable of independent living  Communication skills   Treatment Modalities: Medication Management, Group therapy, Case management,  1 to 1 session with clinician, Psychoeducation, Recreational therapy.   Physician Treatment Plan for Primary Diagnosis: MDD (major depressive disorder), recurrent episode, moderate  (HCC) Long Term Goal(s):     Short Term Goals:    Medication Management: Evaluate patient's response, side effects, and tolerance of medication regimen.  Therapeutic Interventions: 1 to 1 sessions, Unit Group sessions and Medication administration.  Evaluation of Outcomes: Not Met  Physician Treatment Plan for Secondary Diagnosis: Principal Problem:   MDD (major depressive disorder), recurrent episode, moderate (HCC) Active Problems:   History of syphilis   Cocaine abuse (HCC)   AIDS (acquired immune deficiency syndrome) (HCC)   Suicidal ideations   Methamphetamine abuse (HCC)   Marijuana use  Long Term Goal(s):     Short Term Goals:       Medication Management: Evaluate patient's response, side effects, and tolerance of medication regimen.  Therapeutic Interventions: 1 to 1 sessions, Unit Group sessions and Medication administration.  Evaluation of Outcomes: Not Met   RN Treatment Plan for Primary Diagnosis: MDD (major depressive disorder), recurrent episode, moderate (HCC) Long Term Goal(s): Knowledge of disease and therapeutic regimen to maintain health will improve  Short Term Goals: Ability to remain free from injury will improve, Ability to verbalize frustration and anger appropriately will improve, Ability to demonstrate self-control, Ability to participate in decision making will improve, Ability to verbalize feelings will improve, Ability to disclose and discuss suicidal ideas, Ability to identify and develop effective coping behaviors will improve, and Compliance with prescribed medications will improve  Medication Management: RN will administer medications as ordered by provider, will assess and evaluate patient's response and provide education to patient for prescribed medication. RN will report any adverse and/or side effects to prescribing provider.  Therapeutic Interventions: 1 on 1 counseling sessions, Psychoeducation, Medication administration, Evaluate  responses to treatment, Monitor vital signs and CBGs as ordered, Perform/monitor CIWA, COWS, AIMS and Fall Risk screenings as ordered, Perform wound care treatments as ordered.  Evaluation of Outcomes: Not Met   LCSW Treatment Plan for Primary Diagnosis: MDD (major depressive disorder), recurrent episode, moderate (HCC) Long Term Goal(s): Safe transition to appropriate next level of care at discharge, Engage patient in therapeutic group addressing interpersonal concerns.  Short Term Goals: Increase social support, Increase ability to appropriately verbalize feelings, Increase emotional regulation, Facilitate acceptance of mental health diagnosis and concerns, Facilitate patient progression through stages of change regarding substance use diagnoses and concerns, Identify triggers associated with mental health/substance abuse issues, and Increase skills for wellness and recovery  Therapeutic Interventions: Assess for all discharge needs, 1 to 1 time with Social worker, Explore available resources and support systems, Assess for adequacy in community support network, Educate family and significant other(s) on suicide prevention, Complete Psychosocial Assessment, Interpersonal group therapy.  Evaluation of Outcomes: Not Met   Progress in Treatment: Attending groups: Yes. and No. Participating in groups: Yes. and No. Taking medication as prescribed: Yes. Toleration medication: Yes. Family/Significant other contact made: Education Completed; Lee Bridges, [phone], mother, has been identified by the patient as the family member/significant other with whom the patient will be residing,  and identified as the person(s) who will aid the patient in the event of a mental health crisis (suicidal ideations/suicide attempt).  With written consent from the patient, the family member/significant other has been provided the following suicide prevention education, prior to the and/or following the discharge  of the patient.   Patient understands diagnosis: Yes. Discussing patient identified problems/goals with staff: Yes. Medical problems stabilized or resolved: Yes. Denies suicidal/homicidal ideation: Yes. Issues/concerns per patient self-inventory: No. Other: None  New problem(s) identified: Yes, Describe:  None  New Short Term/Long Term Goal(s): detox, elimination of symptoms of psychosis, medication management for mood stabilization; elimination of SI thoughts; development of comprehensive mental wellness/sobriety plan.    Patient Goals:  "Detox, get mentally stable and get on the right meds."  Discharge Plan or Barriers: CSW to assist in the development of appropriate discharge planning.   Reason for Continuation of Hospitalization: Aggression Anxiety Delusions  Depression Suicidal ideation Withdrawal symptoms  Estimated Length of Stay: 1-7 days.   Last 3 [place] Suicide Severity Risk Score: Flowsheet Row Admission (Current) from 08/09/2023 in [facility] ED from 08[facility]08/2023 in [facility] ED from 08/05/2023 in [facility] Emergency Department at [facility]  C-SSRS RISK CATEGORY High Risk Error: Q7 should not be populated when Q6 is No Error: Q3, 4, or 5 should not be populated when Q2 is No       Last PHQ 2/9 Scores:    03/29/2023    8:45 AM 12/28/2022   10:49 AM 12/23/2022   10:38 AM  Depression screen PHQ 2/9  Decreased Interest 2 2 0  Down, Depressed, Hopeless 0 3 0  PHQ - 2 Score 2 5 0  Altered sleeping 3 2   Tired, decreased energy 0 3   Change in appetite 0 3   Feeling bad or failure about yourself  0 3   Trouble concentrating 0 3   Moving slowly or fidgety/restless 0 3   Suicidal thoughts 2 2   PHQ-9 Score 7 24   Difficult doing work/chores Somewhat difficult Somewhat difficult     Scribe for Treatment Team: Lowry Ram, LCSW 08/10/2023 10:22 AM

## 2023-08-10 NOTE — Plan of Care (Signed)
 Marland Kitchen

## 2023-08-10 NOTE — Group Note (Signed)
 Date:  08/10/2023 Time:  12:55 AM  Group Topic/Focus:  Wrap-Up Group:   The focus of this group is to help patients review their daily goal of treatment and discuss progress on daily workbooks.    Participation Level:  Did Not Attend  Participation Quality:   none  Affect:   none  Cognitive:   none  Insight: None  Engagement in Group:   none  Modes of Intervention:   none  Additional Comments:  none   Laurrie Toppin 08/10/2023, 12:55 AM

## 2023-08-10 NOTE — Progress Notes (Signed)
 [facility] MD Progress Note  08/10/2023 9:53 AM Lee Bridges  MRN:  [id number]   33 year old never married African-American male with a self-reported history of " schizophrenia, bipolar with psychotic features", polysubstance abuse (methamphetamine, cocaine, marijuana), medical history of AIDS, neurosyphilis, noncompliance with medications, presented to [facility] on 3/10 with chief complaints of suicidal ideation without plan " I just want to die".  Reported to them that he wanted to kill himself once he was discharged, endorsed visual hallucinations.  While there patient was reportedly agitated, threatening to slap and punch staff members.  UDS positive for amphetamine, benzodiazepine, cocaine, methamphetamine, THC.    Subjective: Patient's case discussed in multidisciplinary team meeting, all vitals and notes were reviewed.  No reported behavioral issues overnight.  Patient was placed on one-to-one yesterday as he had an outburst and threatened to harm himself.  Patient seen for reassessment, reports that he is still not doing too well.  Reports good sleep and appetite.  Continues with depressed mood which he rates as an 8 out of 10.  Attributes this to multiple life stressors including chronic illness, homelessness, substance abuse, minimal family support, recently being denied disability benefits.  He continues to inquire about some assistance with application for disability benefits.  He is denying any anxiety.  Denies any SI/HI and AVH.  Reports that he is not feeling as paranoid.  Denies any medication side effects.  Social work team reports that they have [facility].   Principal Problem: MDD (major depressive disorder), recurrent episode, moderate (HCC) Diagnosis: Principal Problem:   MDD (major depressive disorder), recurrent episode, moderate (HCC) Active Problems:   History of syphilis   Cocaine abuse (HCC)   AIDS  (acquired immune deficiency syndrome) (HCC)   Suicidal ideations   Methamphetamine abuse (HCC)   Marijuana use  Total Time spent with patient: 30 minutes  Past Psychiatric History:  DX: Bipolar disorder Outpatient provider: Behavioral health in [place], [place] Current caregiver:  Patient is own guardian/ care giver Past hospitalizations: X 1 at behavioral health in [place] in 2001 when he attempted to hang himself in a hotel, at the time he contacted [employer] to let them know his plan  Medication trials : Seroquel, Trintellix, Zyprexa Suicide attempts: 2001 when he attempted to hang himself in a hotel, at the time he contacted [employer] to let them know his plan  Patient denies ever having an Act/CST team. Denies ECT, Clozaril treatments.  Past Medical History:  Past Medical History:  Diagnosis Date   Depression    HIV (human immunodeficiency virus infection) (HCC)    Psychosis (HCC)    Substance abuse (HCC)    History reviewed. No pertinent surgical history. Family History:  Family History  Problem Relation Age of Onset   Arthritis Mother    Diabetes Father    Hypertension Father    Family Psychiatric  History:  Psych dx: maternal cousin - schizophrenia, father - schizophrenia Suicide: maternal cousin, hung self in 2003 Substance use: mother - EtOH, cocaine; father - EtOH   Social History:  Social History   Substance and Sexual Activity  Alcohol Use Not Currently   Comment: socially     Social History   Substance and Sexual Activity  Drug Use Yes   Types: Cocaine, Marijuana, Methamphetamines   Comment: weekly    Social History   Socioeconomic History   Marital status: Single    Spouse name: Not on file  Number of children: 0   Years of education: Not on file   Highest education level: Associate degree: academic program  Occupational History   Not on file  Tobacco Use   Smoking status: Every Day    Current packs/day: 0.50    Types: Cigarettes   Smokeless  tobacco: Never   Tobacco comments:    1 PPD  Substance and Sexual Activity   Alcohol use: Not Currently    Comment: socially   Drug use: Yes    Types: Cocaine, Marijuana, Methamphetamines    Comment: weekly   Sexual activity: Not Currently  Other Topics Concern   Not on file  Social History Narrative   Homeless x 2 yrs.    Social Drivers of Corporate investment banker Strain: Not on file  Food Insecurity: No Food Insecurity (08/09/2023)   Hunger Vital Sign    Worried About Running Out of Food in the Last Year: Never true    Ran Out of Food in the Last Year: Never true  Recent Concern: Food Insecurity - Food Insecurity Present (07/29/2023)   Hunger Vital Sign    Worried About Running Out of Food in the Last Year: Sometimes true    Ran Out of Food in the Last Year: Sometimes true  Transportation Needs: No Transportation Needs (08/09/2023)   PRAPARE - Administrator, Civil Service (Medical): No    Lack of Transportation (Non-Medical): No  Recent Concern: Transportation Needs - Unmet Transportation Needs (07/29/2023)   PRAPARE - Transportation    Lack of Transportation (Medical): Yes    Lack of Transportation (Non-Medical): Yes  Physical Activity: Not on file  Stress: Not on file  Social Connections: Socially Isolated (08/09/2023)   Social Connection and Isolation Panel [NHANES]    Frequency of Communication with Friends and Family: Never    Frequency of Social Gatherings with Friends and Family: Never    Attends Religious Services: Never    Database administrator or Organizations: No    Attends Engineer, structural: Never    Marital Status: Never married   Additional Social History:                         Sleep: Good  Appetite:  Good  Current Medications: Current Facility-Administered Medications  Medication Dose Route Frequency Provider Last Rate Last Admin   bictegravir-emtricitabine-tenofovir AF (BIKTARVY) 50-200-25 MG per tablet 1  tablet  1 tablet Oral Daily Arvada Seaborn, PA-C   1 tablet at 08/10/23 7846   diphenhydrAMINE (BENADRYL) capsule 50 mg  50 mg Oral Q6H PRN Arshan Jabs, PA-C       doxycycline (VIBRA-TABS) tablet 200 mg  200 mg Oral BID Ruthie Berch, PA-C   200 mg at 08/10/23 9629   LORazepam (ATIVAN) tablet 2 mg  2 mg Oral Q6H PRN Tyanna Hach, PA-C   2 mg at 08/09/23 1514   melatonin tablet 5 mg  5 mg Oral QHS Tayshon Winker, PA-C       methocarbamol (ROBAXIN) tablet 500 mg  500 mg Oral Q6H PRN Graycie Halley, PA-C       nicotine polacrilex (NICORETTE) gum 4 mg  4 mg Oral PRN Ayaan Ringle, PA-C   4 mg at 08/10/23 0829   OLANZapine (ZYPREXA) injection 10 mg  10 mg Intramuscular TID PRN Zanden Colver, PA-C       OLANZapine (ZYPREXA) injection 5 mg  5 mg Intramuscular TID PRN Danyle Boening, Judeth Cornfield, PA-C  OLANZapine zydis (ZYPREXA) disintegrating tablet 5 mg  5 mg Oral TID PRN Machell Wirthlin, PA-C       ondansetron (ZOFRAN-ODT) disintegrating tablet 4 mg  4 mg Oral Q8H PRN Taneal Sonntag, PA-C       QUEtiapine (SEROQUEL) tablet 50 mg  50 mg Oral QHS Deangela Randleman, PA-C       sertraline (ZOLOFT) tablet 50 mg  50 mg Oral Daily Demondre Aguas, PA-C   50 mg at 08/10/23 1610   sulfamethoxazole-trimethoprim (BACTRIM) 400-80 MG per tablet 1 tablet  1 tablet Oral Daily Esmee Fallaw, PA-C   1 tablet at 08/10/23 9604    Lab Results:  Results for orders placed or performed during the hospital encounter of 08/08/23 (from the past 48 hours)  POCT Urine Drug Screen - (I-Screen)     Status: Abnormal   Collection Time: 08/08/23  8:37 PM  Result Value Ref Range   POC Amphetamine UR Positive (A) NONE DETECTED (Cut Off Level 1000 ng/mL)   POC Secobarbital (BAR) None Detected NONE DETECTED (Cut Off Level 300 ng/mL)   POC Buprenorphine (BUP) None Detected NONE DETECTED (Cut Off Level 10 ng/mL)   POC Oxazepam (BZO) Positive (A) NONE DETECTED (Cut Off  Level 300 ng/mL)   POC Cocaine UR Positive (A) NONE DETECTED (Cut Off Level 300 ng/mL)   POC Methamphetamine UR Positive (A) NONE DETECTED (Cut Off Level 1000 ng/mL)   POC Morphine None Detected NONE DETECTED (Cut Off Level 300 ng/mL)   POC Methadone UR None Detected NONE DETECTED (Cut Off Level 300 ng/mL)   POC Oxycodone UR None Detected NONE DETECTED (Cut Off Level 100 ng/mL)   POC Marijuana UR Positive (A) NONE DETECTED (Cut Off Level 50 ng/mL)  CBC with Differential/Platelet     Status: Abnormal   Collection Time: 08/08/23  8:43 PM  Result Value Ref Range   WBC 4.4 4.0 - 10.5 K/uL   RBC 3.86 (L) 4.22 - 5.81 MIL/uL   Hemoglobin 13.1 13.0 - 17.0 g/dL   HCT 54.0 98.1 - 19.1 %   MCV 101.3 (H) 80.0 - 100.0 fL   MCH 33.9 26.0 - 34.0 pg   MCHC 33.5 30.0 - 36.0 g/dL   RDW 47.8 29.5 - 62.1 %   Platelets 324 150 - 400 K/uL   nRBC 0.0 0.0 - 0.2 %   Neutrophils Relative % 34 %   Neutro Abs 1.5 (L) 1.7 - 7.7 K/uL   Lymphocytes Relative 38 %   Lymphs Abs 1.7 0.7 - 4.0 K/uL   Monocytes Relative 18 %   Monocytes Absolute 0.8 0.1 - 1.0 K/uL   Eosinophils Relative 8 %   Eosinophils Absolute 0.3 0.0 - 0.5 K/uL   Basophils Relative 1 %   Basophils Absolute 0.0 0.0 - 0.1 K/uL   Immature Granulocytes 1 %   Abs Immature Granulocytes 0.06 0.00 - 0.07 K/uL    Comment: Performed at [facility], [address] [place], [place] [address]  Comprehensive metabolic panel     Status: Abnormal   Collection Time: 08/08/23  8:43 PM  Result Value Ref Range   Sodium 137 135 - 145 mmol/L   Potassium 4.1 3.5 - 5.1 mmol/L   Chloride 102 98 - 111 mmol/L   CO2 26 22 - 32 mmol/L   Glucose, Bld 95 70 - 99 mg/dL    Comment: Glucose reference range applies only to samples taken after fasting for at least 8 hours.   BUN 14 6 -  20 mg/dL   Creatinine, Ser 1.61 (H) 0.61 - 1.24 mg/dL   Calcium 9.5 8.9 - 09.6 mg/dL   Total Protein 8.1 6.5 - 8.1 g/dL   Albumin 3.5 3.5 - 5.0 g/dL   AST 22 15 - 41 U/L   ALT 30  0 - 44 U/L   Alkaline Phosphatase 59 38 - 126 U/L   Total Bilirubin 0.5 0.0 - 1.2 mg/dL   GFR, Estimated >04 >54 mL/min    Comment: (NOTE) Calculated using the CKD-EPI Creatinine Equation (2021)    Anion gap 9 5 - 15    Comment: Performed at [facility], [address], [place], [place] [address]  Ethanol     Status: None   Collection Time: 08/08/23  8:43 PM  Result Value Ref Range   Alcohol, Ethyl (B) <10 <10 mg/dL    Comment: (NOTE) Lowest detectable limit for serum alcohol is 10 mg/dL.  For medical purposes only. Performed at [facility], [address] [place], [place] [address]   TSH     Status: None   Collection Time: 08/08/23  8:43 PM  Result Value Ref Range   TSH 0.694 0.350 - 4.500 uIU/mL    Comment: Performed by a 3rd Generation assay with a functional sensitivity of <=0.01 uIU/mL. Performed at [facility], [address], [place], [place] [address]     Blood Alcohol level:  Lab Results  Component Value Date   ETH <10 08/08/2023   ETH <10 01/25/2021    Metabolic Disorder Labs: No results found for: "HGBA1C", "MPG" No results found for: "PROLACTIN" No results found for: "CHOL", "TRIG", "HDL", "CHOLHDL", "VLDL", "LDLCALC"  Physical Findings: AIMS:  , ,  ,  ,    CIWA:    COWS:     Musculoskeletal: Strength & Muscle Tone: within normal limits Gait & Station: normal Patient leans: N/A  Psychiatric Specialty Exam:  Presentation  General Appearance:  Appropriate for Environment  Eye Contact: Good  Speech: Normal Rate  Speech Volume: Normal  Handedness: Right   Mood and Affect  Mood: Labile  Affect: Labile   Thought Process  Thought Processes: Goal Directed  Descriptions of Associations:Intact  Orientation:Full (Time, Place and Person)  Thought Content:Paranoid Ideation  History of Schizophrenia/Schizoaffective disorder:No  Duration of Psychotic Symptoms:Less than six  months  Hallucinations:Hallucinations: Auditory Description of Auditory Hallucinations: "I just hear voices, i can't describe them", states he cannot make out what they're saying, gender, how many, worse at night  Ideas of Reference:Paranoia; Percusatory  Suicidal Thoughts:Suicidal Thoughts: Yes, Active SI Active Intent and/or Plan: With Intent; Without Plan; Without Means to Carry Out  Homicidal Thoughts:Homicidal Thoughts: No   Sensorium  Memory: Immediate Good; Recent Good; Remote Good  Judgment: Impaired  Insight: Fair   Art therapist  Concentration: Good  Attention Span: Good  Recall: Good  Fund of Knowledge: Fair  Language: Fair   Psychomotor Activity  Psychomotor Activity: Psychomotor Activity: Normal   Assets  Assets: Communication Skills; Leisure Time   Sleep  Sleep: Sleep: Good (with prescribed seroquel)    Physical Exam: Physical Exam Vitals and nursing note reviewed.  HENT:     Head: Normocephalic and atraumatic.  Eyes:     Extraocular Movements: Extraocular movements intact.  Pulmonary:     Effort: Pulmonary effort is normal.  Musculoskeletal:        General: Normal range of motion.     Cervical back: Normal range of motion.  Skin:  General: Skin is dry.  Neurological:     Mental Status: He is alert and oriented to person, place, and time.  Psychiatric:        Attention and Perception: Attention and perception normal.        Mood and Affect: Mood is depressed. Affect is labile.        Speech: Speech normal.        Behavior: Behavior normal. Behavior is cooperative.        Thought Content: Thought content normal.        Cognition and Memory: Cognition and memory normal.        Judgment: Judgment is impulsive.    Review of Systems  Psychiatric/Behavioral:  Positive for depression and substance abuse.   All other systems reviewed and are negative.  Blood pressure 123/78, pulse 69, temperature 98.4 F (36.9 C),  resp. rate 20, height 5\' 2"  (1.575 m), weight 53.1 kg, SpO2 99%. Body mass index is 21.4 kg/m.   Treatment Plan Summary:  Principal Problem:   MDD (major depressive disorder), recurrent episode, moderate (HCC) Active Problems:   History of syphilis   Cocaine abuse (HCC)   AIDS (acquired immune deficiency syndrome) (HCC)   Suicidal ideations   Methamphetamine abuse (HCC)   Marijuana use   1.    Safety and Monitoring:   --  Voluntary admission to inpatient psychiatric unit for safety, stabilization and treatment -- Daily contact with patient to assess and evaluate symptoms and progress in treatment -- Patient's case to be discussed in multi-disciplinary team meeting -- Observation Level : q15 minute checks   -- Vital signs:  q12 hours -- Precautions: suicide   2. Psychiatric Diagnoses and Treatment:   08/10/2023 -- Increase Seroquel 50 mg to 100 mg HS for mood stabilization/sleep/depressive sxs/psychosis -- Continue Zoloft 50 mg every day for depressive sxs -- Continue melatonin 5 mg HS for sleep aid  -- Continue Lorazepam 2 mg q6h prn for agitation/anxiety   08/09/2023 -- Continue Seroquel 50 mg HS for mood/sleep/depressive sxs/psychosis -- Start Zoloft 50 mg every day for depressive sxs -- Continue melatonin 5 mg HS for sleep aid  -- Start Lorazepam 2 mg q6h prn for agitation/anxiety    --  The risks/benefits/side-effects/alternatives to this medication were discussed in detail with the patient and time was given for questions. The patient consents to medication trial. -- Metabolic profile and EKG monitoring obtained while on an atypical antipsychotic  (BMI: 21.40; Lipid Panel: pending;  HbgA1c: pending; QTc: pending) -- Encouraged patient to participate in unit milieu and in scheduled group therapies -- Short Term Goals: Ability to identify changes in lifestyle to reduce recurrence of condition will improve, Ability to verbalize feelings will improve, Ability to disclose and  discuss suicidal ideas, Ability to demonstrate self-control will improve, Ability to identify and develop effective coping behaviors will improve, Ability to maintain clinical measurements within normal limits will improve, Compliance with prescribed medications will improve, and Ability to identify triggers associated with substance abuse/mental health issues will improve -- Long Term Goals: Improvement in symptoms so as ready for discharge        3. Medical Issues Being Addressed:               Hx of neurosyphilis/untreated due to non compliance  3/12 -- seen by ID, recommendations/pla: to follow up with ID colleage who saw pt, then determine need to restart antibiotics, 'to see whether they can do FTA-ABS on the csf-from 2/28 if still  viable - treat the syphilis with Benzathine penicillin 2.4 million units ( will need weekly X3). This is needed even if we treat neurosyphilis with Pen G '             3/11 -- Consult ID              -- Continue home med Doxycycline 200 mg BID and Bactrim 400-80 mg daily                HIV/AIDS             -- Continue home med Biktarvy 1 tablet daily              -- Consult ID                Tobacco Use Disorder             -- Nicotine gum 2mg  PRN             -- Smoking cessation encouraged   4. Discharge Planning:   -- Social work and case management to assist with discharge planning and identification of hospital follow-up needs prior to discharge -- Estimated LOS: 5-7 days -- Discharge Concerns: Need to establish a safety plan; Medication compliance and effectiveness -- Discharge Goals: Return home with outpatient referrals for mental health follow-up including medication management/psychotherapy     Paulene Floor, PA-C 08/10/2023, 9:53 AM

## 2023-08-10 NOTE — [facility] Group Notes (Signed)
 [facility] Group Notes:  (Nursing/MHT/Case Management/Adjunct)  Date:  08/10/2023  Time:  9:31 PM  Type of Therapy:  Group Therapy  Participation Level:  None  Participation Quality:   None  Affect:   none  Cognitive:  None  Insight:  None  Engagement in Group:  None  Modes of Intervention:  Problem-solving and self awareness  Summary of Progress/Problems: Did not attend group  Tacy Dura 08/10/2023, 9:31 PM

## 2023-08-10 NOTE — [facility] Counselor (Signed)
 CSW touched base with [facility]. Belenda Cruise., admission coordinator explained that patient's referral is still under review.   CSW team to continue to assess.    Reymundo Poll, MSW, LCSWA 08/10/2023 12:04 PM

## 2023-08-10 NOTE — Consult Note (Signed)
 NAME: Lee Bridges  DOB: 1991/02/17  MRN: [id number]  Date/Time: 08/10/2023 12:48 PM  REQUESTING PROVIDER: Judeth Cornfield tingling Subjective:  REASON FOR CONSULT: Neurosyphilis ? Lee Bridges is a 33 y.o. male with a history of AIDS, past he substance use disorder, major depressive disorder, syphilis recently being treated as neurosyphilis was admitted to the behavioral health unit because of suicidal ideation.  I am asked to see the patient for neurosyphilis treatment Patient is followed for HIV at [facility].  He is on [employer].  He is nonadherent because of multiple social issues including homelessness and polysubstance use and mental health issues Patient on 07/28/2023 had presented to the HIV clinic complaining of blurred vision and numbness in his arms and tingling in his hands left more than right and  he  had a positive RPR of 1:256 which in OCT 2024 was 1:32 and was never treated , he was sent to the hospital to be evaluated for neurosyphilis.  He underwent lumbar puncture and that showed   Total protein was 21 He was started on IV penicillin as continuous infusion 24,000,000 units per 24 hours until and was and received in the hospital until 08/02/2023.  An MRI of the brain with and without contrast did not reveal any acute findings.  The VDRL in in CSF came back negative . On 08/03/2023 he was discharged home with a midline and to continue IV at home which the patient did not   He came back to the ED on 08/05/2023 complaining of the IV line not working.  It was removed in the ED and the patient wanted to leave AMA and so was prescribed doxycycline 200 mg p.o. twice daily which she did not take.  The initial plan was the patient to be going home with this mother and sister and patient says that he did not get support from the family He presented to the behavioral unit voluntarily with his sister complaining of suicidal ideation and agitation. I am asked to see the patient for management of the  neurosyphilis Patient says he continues to have blurred vision and also has some numbness in his left arm and tingling in his left hand.  I am not sure whether he had an ophthalmological examination during the last admission at [facility] though one was asked for He has a past history of syphilis and was treated with 3 doses of Benzathine penicillin on 03/20/21, 03/27/21 and 04/06/21  His RPR titer is as follows  In OCT 2024 in the ED a titer of 1:32 was noted  and they tried to reach patient with no success. HD was able to notify him and he says he got treatment but his HIV provider note states he did not and that may be correct as the RPR in feb 2025 was 1: 256 Patient states he is homeless and  cannot get housing or work because of a sex offender charge ( which is not true he says)  and this is bothering him and he does not take meds because of social situation- I need to discuss with psychiatry team as I dont see this mentioned in any of their documentation Past Medical History:  Diagnosis Date   Depression    HIV (human immunodeficiency virus infection) (HCC)    Psychosis (HCC)    Substance abuse (HCC)     History reviewed. No pertinent surgical history.  Social History   Socioeconomic History   Marital status: Single    Spouse name: Not on  file   Number of children: 0   Years of education: Not on file   Highest education level: Associate degree: academic program  Occupational History   Not on file  Tobacco Use   Smoking status: Every Day    Current packs/day: 0.50    Types: Cigarettes   Smokeless tobacco: Never   Tobacco comments:    1 PPD  Substance and Sexual Activity   Alcohol use: Not Currently    Comment: socially   Drug use: Yes    Types: Cocaine, Marijuana, Methamphetamines    Comment: weekly   Sexual activity: Not Currently  Other Topics Concern   Not on file  Social History Narrative   Homeless x 2 yrs.    Social Drivers of Corporate investment banker  Strain: Not on file  Food Insecurity: No Food Insecurity (08/09/2023)   Hunger Vital Sign    Worried About Running Out of Food in the Last Year: Never true    Ran Out of Food in the Last Year: Never true  Recent Concern: Food Insecurity - Food Insecurity Present (07/29/2023)   Hunger Vital Sign    Worried About Running Out of Food in the Last Year: Sometimes true    Ran Out of Food in the Last Year: Sometimes true  Transportation Needs: No Transportation Needs (08/09/2023)   PRAPARE - Administrator, Civil Service (Medical): No    Lack of Transportation (Non-Medical): No  Recent Concern: Transportation Needs - Unmet Transportation Needs (07/29/2023)   PRAPARE - Administrator, Civil Service (Medical): Yes    Lack of Transportation (Non-Medical): Yes  Physical Activity: Not on file  Stress: Not on file  Social Connections: Socially Isolated (08/09/2023)   Social Connection and Isolation Panel [NHANES]    Frequency of Communication with Friends and Family: Never    Frequency of Social Gatherings with Friends and Family: Never    Attends Religious Services: Never    Database administrator or Organizations: No    Attends Banker Meetings: Never    Marital Status: Never married  Intimate Partner Violence: Not At Risk (08/09/2023)   Humiliation, Afraid, Rape, and Kick questionnaire    Fear of Current or Ex-Partner: No    Emotionally Abused: No    Physically Abused: No    Sexually Abused: No    Family History  Problem Relation Age of Onset   Arthritis Mother    Diabetes Father    Hypertension Father    Allergies  Allergen Reactions   Fish Allergy Hives and Nausea And Vomiting   Shellfish Allergy Hives and Nausea And Vomiting   I? Current Facility-Administered Medications  Medication Dose Route Frequency Provider Last Rate Last Admin   bictegravir-emtricitabine-tenofovir AF (BIKTARVY) 50-200-25 MG per tablet 1 tablet  1 tablet Oral Daily Tingling,  Stephanie, PA-C   1 tablet at 08/10/23 1610   diphenhydrAMINE (BENADRYL) capsule 50 mg  50 mg Oral Q6H PRN Tingling, Stephanie, PA-C       doxycycline (VIBRA-TABS) tablet 200 mg  200 mg Oral BID Tingling, Stephanie, PA-C   200 mg at 08/10/23 9604   LORazepam (ATIVAN) tablet 2 mg  2 mg Oral Q6H PRN Tingling, Stephanie, PA-C   2 mg at 08/09/23 1514   melatonin tablet 5 mg  5 mg Oral QHS Tingling, Stephanie, PA-C       methocarbamol (ROBAXIN) tablet 500 mg  500 mg Oral Q6H PRN Tingling, Judeth Cornfield, PA-C  nicotine polacrilex (NICORETTE) gum 4 mg  4 mg Oral PRN Tingling, Stephanie, PA-C   4 mg at 08/10/23 0829   OLANZapine (ZYPREXA) injection 10 mg  10 mg Intramuscular TID PRN Tingling, Stephanie, PA-C       OLANZapine (ZYPREXA) injection 5 mg  5 mg Intramuscular TID PRN Tingling, Stephanie, PA-C       OLANZapine zydis (ZYPREXA) disintegrating tablet 5 mg  5 mg Oral TID PRN Tingling, Stephanie, PA-C       ondansetron (ZOFRAN-ODT) disintegrating tablet 4 mg  4 mg Oral Q8H PRN Tingling, Stephanie, PA-C       QUEtiapine (SEROQUEL) tablet 50 mg  50 mg Oral QHS Tingling, Stephanie, PA-C       sertraline (ZOLOFT) tablet 50 mg  50 mg Oral Daily Tingling, Stephanie, PA-C   50 mg at 08/10/23 1610   sulfamethoxazole-trimethoprim (BACTRIM) 400-80 MG per tablet 1 tablet  1 tablet Oral Daily Tingling, Stephanie, PA-C   1 tablet at 08/10/23 9604     Abtx:  Anti-infectives (From admission, onward)    Start     Dose/Rate Route Frequency Ordered Stop   08/09/23 0800  bictegravir-emtricitabine-tenofovir AF (BIKTARVY) 50-200-25 MG per tablet 1 tablet        1 tablet Oral Daily 08/09/23 0100     08/09/23 0800  doxycycline (VIBRA-TABS) tablet 200 mg        200 mg Oral 2 times daily 08/09/23 0100     08/09/23 0800  sulfamethoxazole-trimethoprim (BACTRIM) 400-80 MG per tablet 1 tablet        1 tablet Oral Daily 08/09/23 0100         REVIEW OF SYSTEMS:  Const: negative fever, negative chills, negative weight  loss Eyes: Blurred vision  ENT: negative coryza, negative sore throat Resp: negative cough, hemoptysis, dyspnea Cards: negative for chest pain, palpitations, lower extremity edema GU: negative for frequency, dysuria and hematuria GI: Negative for abdominal pain, as diarrhea, bleeding, constipation Skin: negative for rash and pruritus Heme: negative for easy bruising and gum/nose bleeding MS: negative for myalgias, arthralgias, back pain and muscle weakness Neurolo: I am chills, numbness left arm tingling left hand  psych:  anxiety, depression, feels like he wants to end his life.  Also concerned that his uncle died of AIDS and he may go the same way Endocrine: negative for thyroid, diabetes Allergy/Immunology-shellfish  o VITALS:  BP 123/78 (BP Location: Left Arm)   Pulse 69   Temp 98.4 F (36.9 C)   Resp 20   Ht 5\' 2"  (1.575 m)   Wt 53.1 kg   SpO2 99%   BMI 21.40 kg/m   PHYSICAL EXAM:  General: Alert, cooperative, no distress, appears stated age.  Some flight of thoughts Head: Normocephalic, without obvious abnormality, atraumatic. Eyes: Pupil pulls are equal and reactive to light and accommodation .  Full range of eye movements.  No diplopia ENT Nares normal. No drainage or sinus tenderness. Lips, mucosa, and tongue normal. No Thrush Neck: Supple, symmetrical, no adenopathy, thyroid: non tender no carotid bruit and no JVD. Back: No CVA tenderness. Lungs: Clear to auscultation bilaterally. No Wheezing or Rhonchi. No rales. Heart: Regular rate and rhythm, no murmur, rub or gallop. Abdomen: Soft, non-tender,not distended. Bowel sounds normal. No masses Extremities: atraumatic, no cyanosis. No edema. No clubbing Skin: No rashes or lesions. Or bruising Lymph: Cervical, supraclavicular normal. Neurologic: Grossly non-focal.  Straight-line walking, nose finger test normal. I could not appreciate any weakness or numbness on examination.  Pertinent Labs Lab Results CBC     Component Value Date/Time   WBC 4.4 08/08/2023 2043   RBC 3.86 (L) 08/08/2023 2043   HGB 13.1 08/08/2023 2043   HCT 39.1 08/08/2023 2043   PLT 324 08/08/2023 2043   MCV 101.3 (H) 08/08/2023 2043   MCH 33.9 08/08/2023 2043   MCHC 33.5 08/08/2023 2043   RDW 11.9 08/08/2023 2043   LYMPHSABS 1.7 08/08/2023 2043   MONOABS 0.8 08/08/2023 2043   EOSABS 0.3 08/08/2023 2043   BASOSABS 0.0 08/08/2023 2043       Latest Ref Rng & Units 08/10/2023   11:11 AM 08/08/2023    8:43 PM 08/05/2023   11:52 AM  CMP  Glucose 70 - 99 mg/dL 94  95  93   BUN 6 - 20 mg/dL 18  14  18    Creatinine 0.61 - 1.24 mg/dL 7.62  8.31  5.17   Sodium 135 - 145 mmol/L 136  137  135   Potassium 3.5 - 5.1 mmol/L 4.0  4.1  4.2   Chloride 98 - 111 mmol/L 105  102  104   CO2 22 - 32 mmol/L 21  26  22    Calcium 8.9 - 10.3 mg/dL 9.2  9.5  9.2   Total Protein 6.5 - 8.1 g/dL  8.1    Total Bilirubin 0.0 - 1.2 mg/dL  0.5    Alkaline Phos 38 - 126 U/L  59    AST 15 - 41 U/L  22    ALT 0 - 44 U/L  30        Microbiology: Recent Results (from the past 240 hours)  Blood culture (routine x 2)     Status: None   Collection Time: 08/05/23 11:39 AM   Specimen: BLOOD  Result Value Ref Range Status   Specimen Description BLOOD RIGHT ANTECUBITAL  Final   Special Requests   Final    AEROBIC BOTTLE ONLY Blood Culture results may not be optimal due to an inadequate volume of blood received in culture bottles   Culture   Final    NO GROWTH 5 DAYS Performed at [facility], [address], [place], [place] [address]    Report Status 08/10/2023 FINAL  Final  Blood culture (routine x 2)     Status: None   Collection Time: 08/05/23 11:44 AM   Specimen: BLOOD RIGHT FOREARM  Result Value Ref Range Status   Specimen Description BLOOD RIGHT FOREARM  Final   Special Requests   Final    AEROBIC BOTTLE ONLY Blood Culture results may not be optimal due to an inadequate volume of blood received in culture bottles   Culture   Final     NO GROWTH 5 DAYS Performed at [facility], [address], [place], [place] [address]    Report Status 08/10/2023 FINAL  Final    IMAGING RESULTS: MRI of the brain from 07/21/2023 reviewed No acute findings I have personally reviewed the films ? Impression/Recommendation AIDS poorly compliant.  On Biktarvy.  Last viral load from 07/29/2023 was 196,000 and CD4 was 171 ( 14%)  Partially treated neurosyphilis.  Received 6 days of IV penicillin from 227 until 08/02/2023.  Was sent home with a midline line by the infectious disease team at [facility] but he did not take treatment and he returned to the ED for malfunctioning  line was removed . He left pretty much AMA from the ED and so was prescribed Doxycycline 200mg   BID for 28 days which he did not fill- Doxy is not the standard in [place] guidelines for neurosyphilis ( [place] guidelines 3rd line) The question is whether he needs to restart treatment He c/o symptoms which are subjective , and clinical examination there is no neurological findings- not sure how was it when he was assessed by Dr.Vu in [place] Clinically he has no evidence of meningitis or GPI or TD CSF examination showed minimal lymphocytic pleocytosis ( which can be seen in HIV patients  perse) , neg VDRL, normal protein  Can he have a false neg VDRL?  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 I would like to check with [facility] to see whether they can do FTA-ABS on the csf-from 2/28 if still viable  This test has much higher sensitivity of nearly 99% compared to VDRL an dif neg will r/o neurosyphilis I will discuss with Dr.Vu to clarify CNS/ophthalm examination when at [facility] before deciding to restart treatment and if so he would have to get 10 full days in the hospital  Meanwhile treat the syphilis with Benzathine penicillin 2.4 million units ( will need weekly X3). This is needed even if we treat  neurosyphilis with Pen G  Polysubstance use ( cocaine, meth) and homelessness and mental health issues- cannot send him home with IV antibiotic or PICC line  MDD, bipolar now with suicidal ideation on quetiapine, sertraline and olanzapine  ? I have personally spent  -75--minutes involved in face-to-face and non-face-to-face activities for this patient on the day of the visit. Professional time spent includes the following activities: Preparing to see the patient (review of tests), Obtaining and/or reviewing separately obtained history (admission/discharge record), Performing a medically appropriate examination and/or evaluation , Ordering medications/tests/procedures, referring and communicating with other health care professionals, Documenting clinical information in the EMR, Independently interpreting results (not separately reported), Communicating results to the patient/family/caregiver, Counseling and educating the patient/family/caregiver and Care coordination (not separately reported).  This involved complex antimicrobial treatment discussion and counseling  ________________________________________________ Discussed with patient, and his nurse Will discuss with requesting provider Note:  This document was prepared using Dragon voice recognition software and may include unintentional dictation errors.

## 2023-08-11 ENCOUNTER — Encounter (HOSPITAL_COMMUNITY): Payer: Self-pay

## 2023-08-11 ENCOUNTER — Inpatient Hospital Stay
Admission: AD | Admit: 2023-08-11 | Discharge: 2023-08-22 | DRG: 057 | Disposition: A | Discharge status: Home / Self Care | Payer: MEDICAID | Source: Other Acute Inpatient Hospital | Attending: Internal Medicine | Admitting: Internal Medicine

## 2023-08-11 DIAGNOSIS — R200 Anesthesia of skin: Secondary | ICD-10-CM | POA: Diagnosis present

## 2023-08-11 DIAGNOSIS — A523 Neurosyphilis, unspecified: Secondary | ICD-10-CM | POA: Diagnosis present

## 2023-08-11 DIAGNOSIS — Z833 Family history of diabetes mellitus: Secondary | ICD-10-CM

## 2023-08-11 DIAGNOSIS — Z5900 Homelessness unspecified: Secondary | ICD-10-CM

## 2023-08-11 DIAGNOSIS — F419 Anxiety disorder, unspecified: Secondary | ICD-10-CM | POA: Diagnosis present

## 2023-08-11 DIAGNOSIS — R45851 Suicidal ideations: Secondary | ICD-10-CM | POA: Diagnosis present

## 2023-08-11 DIAGNOSIS — Z91013 Allergy to seafood: Secondary | ICD-10-CM | POA: Diagnosis not present

## 2023-08-11 DIAGNOSIS — Z79899 Other long term (current) drug therapy: Secondary | ICD-10-CM | POA: Diagnosis not present

## 2023-08-11 DIAGNOSIS — E538 Deficiency of other specified B group vitamins: Secondary | ICD-10-CM | POA: Diagnosis present

## 2023-08-11 DIAGNOSIS — D539 Nutritional anemia, unspecified: Secondary | ICD-10-CM | POA: Diagnosis present

## 2023-08-11 DIAGNOSIS — F1910 Other psychoactive substance abuse, uncomplicated: Secondary | ICD-10-CM | POA: Diagnosis not present

## 2023-08-11 DIAGNOSIS — Z21 Asymptomatic human immunodeficiency virus [HIV] infection status: Secondary | ICD-10-CM | POA: Diagnosis present

## 2023-08-11 DIAGNOSIS — F339 Major depressive disorder, recurrent, unspecified: Secondary | ICD-10-CM

## 2023-08-11 DIAGNOSIS — Z8249 Family history of ischemic heart disease and other diseases of the circulatory system: Secondary | ICD-10-CM | POA: Diagnosis not present

## 2023-08-11 DIAGNOSIS — F17210 Nicotine dependence, cigarettes, uncomplicated: Secondary | ICD-10-CM | POA: Diagnosis present

## 2023-08-11 DIAGNOSIS — F321 Major depressive disorder, single episode, moderate: Secondary | ICD-10-CM | POA: Diagnosis not present

## 2023-08-11 DIAGNOSIS — F319 Bipolar disorder, unspecified: Secondary | ICD-10-CM | POA: Diagnosis present

## 2023-08-11 DIAGNOSIS — B20 Human immunodeficiency virus [HIV] disease: Secondary | ICD-10-CM | POA: Diagnosis present

## 2023-08-11 DIAGNOSIS — Z91148 Patient's other noncompliance with medication regimen for other reason: Secondary | ICD-10-CM

## 2023-08-11 DIAGNOSIS — F1510 Other stimulant abuse, uncomplicated: Secondary | ICD-10-CM | POA: Diagnosis not present

## 2023-08-11 DIAGNOSIS — F1410 Cocaine abuse, uncomplicated: Secondary | ICD-10-CM | POA: Diagnosis present

## 2023-08-11 DIAGNOSIS — F331 Major depressive disorder, recurrent, moderate: Secondary | ICD-10-CM | POA: Diagnosis present

## 2023-08-11 DIAGNOSIS — Z91199 Patient's noncompliance with other medical treatment and regimen due to unspecified reason: Secondary | ICD-10-CM

## 2023-08-11 MED ORDER — PENICILLIN G POTASSIUM 20000000 UNITS IJ SOLR
12.0000 10*6.[IU] | Freq: Two times a day (BID) | INTRAVENOUS | Status: DC
Start: 2023-08-11 — End: 2023-08-11
  Administered 2023-08-11: 12 10*6.[IU] via INTRAVENOUS
  Filled 2023-08-11: qty 10

## 2023-08-11 MED ORDER — METHOCARBAMOL 500 MG PO TABS
500.0000 mg | ORAL_TABLET | Freq: Four times a day (QID) | ORAL | Status: DC | PRN
  Administered 2023-08-12: 500 mg via ORAL
  Filled 2023-08-11: qty 1

## 2023-08-11 MED ORDER — THIAMINE MONONITRATE 100 MG PO TABS
100.0000 mg | ORAL_TABLET | Freq: Every day | ORAL | Status: DC
  Administered 2023-08-12 – 2023-08-22 (×11): 100 mg via ORAL
  Filled 2023-08-11 (×11): qty 1

## 2023-08-11 MED ORDER — ONDANSETRON HCL 4 MG/2ML IJ SOLN: 4.0000 mg | Freq: Four times a day (QID) | INTRAMUSCULAR | Status: DC | PRN

## 2023-08-11 MED ORDER — POLYETHYLENE GLYCOL 3350 17 G PO PACK
17.0000 g | PACK | Freq: Every day | ORAL | Status: DC | PRN
  Administered 2023-08-17: 17 g via ORAL
  Filled 2023-08-11: qty 1

## 2023-08-11 MED ORDER — FOLIC ACID 1 MG PO TABS
1.0000 mg | ORAL_TABLET | Freq: Every day | ORAL | Status: DC
  Administered 2023-08-12 – 2023-08-22 (×11): 1 mg via ORAL
  Filled 2023-08-11 (×11): qty 1

## 2023-08-11 MED ORDER — ACETAMINOPHEN 650 MG RE SUPP: 650.0000 mg | Freq: Four times a day (QID) | RECTAL | Status: DC | PRN

## 2023-08-11 MED ORDER — PENICILLIN G POTASSIUM 20000000 UNITS IJ SOLR: 4.0000 10*6.[IU] | INTRAVENOUS | Status: DC

## 2023-08-11 MED ORDER — SULFAMETHOXAZOLE-TRIMETHOPRIM 400-80 MG PO TABS
1.0000 | ORAL_TABLET | Freq: Every day | ORAL | Status: DC
  Administered 2023-08-12 – 2023-08-18 (×7): 1 via ORAL
  Filled 2023-08-11 (×7): qty 1

## 2023-08-11 MED ORDER — SERTRALINE HCL 50 MG PO TABS
50.0000 mg | ORAL_TABLET | Freq: Every day | ORAL | Status: DC
  Administered 2023-08-12 – 2023-08-22 (×11): 50 mg via ORAL
  Filled 2023-08-11 (×11): qty 1

## 2023-08-11 MED ORDER — ENOXAPARIN SODIUM 40 MG/0.4ML IJ SOSY
40.0000 mg | PREFILLED_SYRINGE | INTRAMUSCULAR | Status: DC
Start: 2023-08-11 — End: 2023-08-22
  Administered 2023-08-12 – 2023-08-21 (×11): 40 mg via SUBCUTANEOUS
  Filled 2023-08-11 (×11): qty 0.4

## 2023-08-11 MED ORDER — BICTEGRAVIR-EMTRICITAB-TENOFOV 50-200-25 MG PO TABS
1.0000 | ORAL_TABLET | Freq: Every day | ORAL | Status: DC
  Administered 2023-08-12 – 2023-08-22 (×11): 1 via ORAL
  Filled 2023-08-11 (×11): qty 1

## 2023-08-11 MED ORDER — SODIUM CHLORIDE 0.9% FLUSH
3.0000 mL | Freq: Two times a day (BID) | INTRAVENOUS | Status: DC
  Administered 2023-08-12 – 2023-08-20 (×14): 3 mL via INTRAVENOUS

## 2023-08-11 MED ORDER — QUETIAPINE FUMARATE 25 MG PO TABS
100.0000 mg | ORAL_TABLET | Freq: Every day | ORAL | Status: DC
  Administered 2023-08-12 – 2023-08-21 (×10): 100 mg via ORAL
  Filled 2023-08-11 (×10): qty 4

## 2023-08-11 MED ORDER — ADULT MULTIVITAMIN W/MINERALS CH
1.0000 | ORAL_TABLET | Freq: Every day | ORAL | Status: DC
  Administered 2023-08-12: 1 via ORAL
  Filled 2023-08-11: qty 1

## 2023-08-11 MED ORDER — PENICILLIN G POTASSIUM 20000000 UNITS IJ SOLR
12.0000 10*6.[IU] | Freq: Two times a day (BID) | INTRAMUSCULAR | Status: AC
  Administered 2023-08-12 – 2023-08-21 (×18): 12 10*6.[IU] via INTRAVENOUS
  Filled 2023-08-11 (×4): qty 12
  Filled 2023-08-11: qty 5
  Filled 2023-08-11: qty 10
  Filled 2023-08-11 (×17): qty 12

## 2023-08-11 MED ORDER — ACETAMINOPHEN 325 MG PO TABS: 650.0000 mg | ORAL_TABLET | Freq: Four times a day (QID) | ORAL | Status: DC | PRN

## 2023-08-11 NOTE — Group Note (Signed)
 Date:  08/11/2023 Time:  2:20 PM  Group Topic/Focus:  Activity Group:   The focus of this group is to promote activity for the patients and encourage them to go outside to the courtyard for some fresh air and some exercise.    Participation Level:  Active  Participation Quality:  Appropriate  Affect:  Appropriate  Cognitive:  Appropriate  Insight: Appropriate  Engagement in Group:  Engaged  Modes of Intervention:  Activity  Additional Comments:    Lee Bridges 08/11/2023, 2:20 PM
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
## 2023-08-11 NOTE — Plan of Care (Signed)
  Problem: Education: Goal: Knowledge of [facility] Education information/materials will improve Outcome: Progressing Goal: Emotional status will improve Outcome: Progressing Goal: Mental status will improve Outcome: Progressing   Problem: Activity: Goal: Interest or engagement in activities will improve Outcome: Progressing Goal: Sleeping patterns will improve Outcome: Progressing   Problem: Safety: Goal: Periods of time without injury will increase Outcome: Progressing

## 2023-08-11 NOTE — Progress Notes (Signed)
 Date of Admission:  08/09/2023      ID: Lee Bridges is a 33 y.o. male Principal Problem:   MDD (major depressive disorder), recurrent episode, moderate (HCC) Active Problems:   History of syphilis   Cocaine abuse (HCC)   AIDS (acquired immune deficiency syndrome) (HCC)   Suicidal ideations   Methamphetamine abuse (HCC)   Marijuana use    Subjective: Doing okay  Medications:   bictegravir-emtricitabine-tenofovir AF  1 tablet Oral Daily   melatonin  5 mg Oral QHS   QUEtiapine  100 mg Oral QHS   sertraline  50 mg Oral Daily   sulfamethoxazole-trimethoprim  1 tablet Oral Daily    Objective: Vital signs in last 24 hours: Patient Vitals for the past 24 hrs:  BP Temp Pulse Resp SpO2  08/11/23 0630 (!) 91/54 98.4 F (36.9 C) 69 16 99 %  08/10/23 1805 119/70 98.2 F (36.8 C) 87 -- 100 %      PHYSICAL EXAM:  General: Alert, cooperative, no distress, appears stated age.  Head: Normocephalic, without obvious abnormality, atraumatic. Eyes: Conjunctivae clear, anicteric sclerae. Pupils are equal ENT Nares normal. No drainage or sinus tenderness. Lips, mucosa, and tongue normal. No Thrush Neck: Supple, symmetrical, no adenopathy, thyroid: non tender no carotid bruit and no JVD. Back: No CVA tenderness. Lungs: Clear to auscultation bilaterally. No Wheezing or Rhonchi. No rales. Heart: Regular rate and rhythm, no murmur, rub or gallop. Abdomen: Soft, non-tender,not distended. Bowel sounds normal. No masses Extremities: atraumatic, no cyanosis. No edema. No clubbing Skin: No rashes or lesions. Or bruising Lymph: Cervical, supraclavicular normal. Neurologic: Grossly non-focal  Lab Results    Latest Ref Rng & Units 08/08/2023    8:43 PM 08/05/2023   11:52 AM 07/31/2023    4:38 AM  CBC  WBC 4.0 - 10.5 K/uL 4.4  4.0  2.5   Hemoglobin 13.0 - 17.0 g/dL 10.2  72.5  36.6   Hematocrit 39.0 - 52.0 % 39.1  35.3  37.1   Platelets 150 - 400 K/uL 324  254  254        Latest Ref Rng &  Units 08/10/2023   11:11 AM 08/08/2023    8:43 PM 08/05/2023   11:52 AM  CMP  Glucose 70 - 99 mg/dL 94  95  93   BUN 6 - 20 mg/dL 18  14  18    Creatinine 0.61 - 1.24 mg/dL 4.40  3.47  4.25   Sodium 135 - 145 mmol/L 136  137  135   Potassium 3.5 - 5.1 mmol/L 4.0  4.1  4.2   Chloride 98 - 111 mmol/L 105  102  104   CO2 22 - 32 mmol/L 21  26  22    Calcium 8.9 - 10.3 mg/dL 9.2  9.5  9.2   Total Protein 6.5 - 8.1 g/dL  8.1    Total Bilirubin 0.0 - 1.2 mg/dL  0.5    Alkaline Phos 38 - 126 U/L  59    AST 15 - 41 U/L  22    ALT 0 - 44 U/L  30        Microbiology:  Studies/Results: No results found.   Assessment/Plan: AIDS poorly compliant.  On Biktarvy.  Last viral load from 07/29/2023 was 196,000 and CD4 was 171 ( 14%)   Partially treated neurosyphilis.  Received 6 days of IV penicillin from 2/27 until 08/02/2023.  Was sent home with a midline line by the infectious disease team at [facility] but  he did not take treatment and he returned to the ED for malfunctioning  line was removed . He left pretty much AMA from the ED and so was prescribed Doxycycline 200mg  BID for 28 days which he did not fill- Doxy is not the standard in [place] guidelines for neurosyphilis ( [place] guidelines 3rd line) Clinically he has no evidence of meningitis or GPI or TD CSF examination showed minimal lymphocytic pleocytosis ( which can be seen in HIV patients  perse) , neg VDRL, normal protein   Can he have a false neg VDRL?   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?- Spoke to Dr.vu and opthal exam done at [facility] was normal  Dr>Grover from [facility] tsaid FTA - ABS test is not performed on [facility] anymore   This test has much higher sensitivity of nearly 99% compared to VDRL an dif neg will r/o neurosyphilis Will treat like neurosyphilis with penicillin Iv 12 million units every 12 hr ocntinuous infusion for 10 days total  DC Doxy HE also received a dose of benzathine penicillin yesterday IM and will do 2 more doses on a weekly basis     Polysubstance use ( cocaine, meth) and homelessness and mental health issues- cannot send him home with IV antibiotic or PICC line   MDD, bipolar now with suicidal ideation on quetiapine, sertraline and olanzapine   Pt will have to be transferred to medical service for IV penicillin= Discussed the management with patient and with care team

## 2023-08-11 NOTE — [facility] Counselor (Addendum)
 CSW touched base with [facility] to assess the status of patient's referral.   Patient has been declined due to "medical status" as well as being medication noncompliant.  Medical team explained that patient must be at least 4 weeks compliant with medications in order to possibly meet criteria.   This has been communicated to team.   CSW team to continue to assess.    Reymundo Poll, MSW, LCSWA 08/11/2023 3:42 PM

## 2023-08-11 NOTE — Progress Notes (Signed)
   08/10/23 2200  Psych Admission Type (Psych Patients Only)  Admission Status Voluntary  Psychosocial Assessment  Patient Complaints Substance abuse  Eye Contact Fair  Facial Expression Anxious  Affect Anxious  Speech Logical/coherent  Interaction Assertive  Motor Activity Fidgety  Appearance/Hygiene Unremarkable  Behavior Characteristics Cooperative  Mood Anxious  Aggressive Behavior  Effect No apparent injury  Thought Process  Coherency WDL  Content WDL  Delusions None reported or observed  Perception WDL  Hallucination None reported or observed  Judgment Impaired  Confusion None  Danger to Self  Current suicidal ideation? Denies  Danger to Others  Danger to Others None reported or observed

## 2023-08-11 NOTE — Plan of Care (Signed)
   Problem: Education: Goal: Knowledge of [facility] Education information/materials will improve Outcome: Progressing Goal: Emotional status will improve Outcome: Progressing Goal: Mental status will improve Outcome: Progressing

## 2023-08-11 NOTE — Plan of Care (Signed)
   Problem: Education: Goal: Knowledge of [facility] Education information/materials will improve Outcome: Adequate for Discharge Goal: Emotional status will improve Outcome: Adequate for Discharge Goal: Mental status will improve Outcome: Adequate for Discharge Goal: Verbalization of understanding the information provided will improve Outcome: Adequate for Discharge   Problem: Activity: Goal: Interest or engagement in activities will improve Outcome: Adequate for Discharge Goal: Sleeping patterns will improve Outcome: Adequate for Discharge   Problem: Coping: Goal: Ability to verbalize frustrations and anger appropriately will improve Outcome: Adequate for Discharge Goal: Ability to demonstrate self-control will improve Outcome: Adequate for Discharge   Problem: Health Behavior/Discharge Planning: Goal: Identification of resources available to assist in meeting health care needs will improve Outcome: Adequate for Discharge Goal: Compliance with treatment plan for underlying cause of condition will improve Outcome: Adequate for Discharge   Problem: Physical Regulation: Goal: Ability to maintain clinical measurements within normal limits will improve Outcome: Adequate for Discharge   Problem: Safety: Goal: Periods of time without injury will increase Outcome: Adequate for Discharge   Problem: Education: Goal: Knowledge of General Education information will improve Description: Including pain rating scale, medication(s)/side effects and non-pharmacologic comfort measures Outcome: Adequate for Discharge   Problem: Health Behavior/Discharge Planning: Goal: Ability to manage health-related needs will improve Outcome: Adequate for Discharge   Problem: Clinical Measurements: Goal: Ability to maintain clinical measurements within normal limits will improve Outcome: Adequate for Discharge Goal: Will remain free from infection Outcome: Adequate for Discharge Goal:  Diagnostic test results will improve Outcome: Adequate for Discharge Goal: Respiratory complications will improve Outcome: Adequate for Discharge Goal: Cardiovascular complication will be avoided Outcome: Adequate for Discharge   Problem: Activity: Goal: Risk for activity intolerance will decrease Outcome: Adequate for Discharge   Problem: Nutrition: Goal: Adequate nutrition will be maintained Outcome: Adequate for Discharge   Problem: Coping: Goal: Level of anxiety will decrease Outcome: Adequate for Discharge   Problem: Elimination: Goal: Will not experience complications related to bowel motility Outcome: Adequate for Discharge Goal: Will not experience complications related to urinary retention Outcome: Adequate for Discharge   Problem: Pain Managment: Goal: General experience of comfort will improve and/or be controlled Outcome: Adequate for Discharge   Problem: Safety: Goal: Ability to remain free from injury will improve Outcome: Adequate for Discharge   Problem: Skin Integrity: Goal: Risk for impaired skin integrity will decrease Outcome: Adequate for Discharge

## 2023-08-11 NOTE — Progress Notes (Signed)
   08/11/23 1700  Psych Admission Type (Psych Patients Only)  Admission Status Voluntary  Psychosocial Assessment  Patient Complaints Depression;Substance abuse  Eye Contact Brief;Fair  Facial Expression Sullen  Affect Anxious  Speech Logical/coherent  Interaction Assertive  Motor Activity  (WDL)  Appearance/Hygiene Unremarkable  Behavior Characteristics Cooperative;Anxious  Thought Process  Coherency WDL  Content WDL  Delusions None reported or observed  Perception WDL  Hallucination None reported or observed  Judgment Impaired  Confusion None  Danger to Self  Current suicidal ideation? Denies  Agreement Not to Harm Self Yes  Description of Agreement verbal  Danger to Others  Danger to Others None reported or observed

## 2023-08-11 NOTE — Group Note (Signed)
 Date:  08/11/2023 Time:  9:03 PM  Group Topic/Focus:  Wellness Toolbox:   The focus of this group is to discuss various aspects of wellness, balancing those aspects and exploring ways to increase the ability to experience wellness.  Patients will create a wellness toolbox for use upon discharge.    Participation Level:  Active  Participation Quality:  Appropriate  Affect:  Appropriate  Cognitive:  Appropriate  Insight: Appropriate  Engagement in Group:  Engaged  Modes of Intervention:  Discussion  Additional Comments:  n/a  Lorenda Ishihara 08/11/2023, 9:03 PM

## 2023-08-11 NOTE — Group Note (Signed)
 Date:  08/11/2023 Time:  11:11 AM  Group Topic/Focus:  Making Healthy Choices:   The focus of this group is to help patients identify negative/unhealthy choices they were using prior to admission and identify positive/healthier coping strategies to replace them upon discharge.    Participation Level:  Did Not Attend   Mary Sella Rily Nickey 08/11/2023, 11:11 AM

## 2023-08-11 NOTE — H&P (Signed)
 History and Physical    Patient: Lee Bridges XBJ:[id number] DOB: [birth year] DOA: (Not on file) DOS: the patient was seen and examined on 08/11/2023 PCP: Pcp, No  Patient coming from: [facility]  Chief Complaint: No chief complaint on file.  HPI: Lee Bridges is a 33 y.o. 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  TRH consulted for admission for treatment of neurosyphilis.  [name] states that he is not experiencing any headache or neck pain at this time.  He was experiencing some left arm numbness and tingling when he had the PICC line and but this is improving since it has been removed.  He also endorses buttock pain with ambulation after receiving an intramuscular penicillin injection yesterday.  He otherwise denies any difficulty with ambulation, dizziness, vision changes, chest pain, shortness of breath, palpitations, nausea, vomiting, diarrhea, abdominal pain.  He is motivated to complete treatment so he can eventually go on to outpatient drug rehabilitation.  Review of Systems: As mentioned in the history of present illness. All other systems reviewed and are negative.  Past Medical History:  Diagnosis Date   Depression    HIV (human immunodeficiency virus infection) (HCC)    Psychosis (HCC)    Substance abuse (HCC)    No past surgical history on file. Social History:  reports that he has been smoking cigarettes. He has never used smokeless tobacco. He reports that he does not currently use alcohol. He reports current drug use. Drugs: Cocaine, Marijuana, and Methamphetamines.  Allergies  Allergen Reactions   Fish Allergy Hives and Nausea And Vomiting   Shellfish Allergy Hives and Nausea And Vomiting    Family History  Problem Relation Age of Onset   Arthritis Mother    Diabetes Father    Hypertension  Father     Prior to Admission medications   Medication Sig Start Date End Date Taking? Authorizing Provider  bictegravir-emtricitabine-tenofovir AF (BIKTARVY) 50-200-25 MG TABS tablet Take 1 tablet by mouth daily. 08/04/23 09/03/23  Jerald Kief, MD  doxycycline (VIBRAMYCIN) 100 MG capsule Take 2 capsules (200 mg total) by mouth 2 (two) times daily for 28 days. 08/05/23 09/02/23  Arthor Captain, PA-C  methocarbamol (ROBAXIN) 500 MG tablet Take 1 tablet (500 mg total) by mouth every 6 (six) hours as needed for muscle spasms. 08/03/23   Jerald Kief, MD  penicillin G IVPB Inject 24 Million Units into the vein daily for 9 days. As a continuous infusion. Indication:  Neurosyphilis  First Dose: Yes Last Day of Therapy:  08/11/23 Labs - Once weekly:  CBC/D and BMP, Labs - Once weekly: ESR and CRP Method of administration: Elastomeric (Continuous infusion) Method of administration may be changed at the discretion of home infusion pharmacist based upon assessment of the patient and/or caregiver's ability to self-administer the medication ordered. 08/02/23 08/11/23  [name], MD  QUEtiapine (SEROQUEL) 50 MG tablet Take 1 tablet (50 mg total) by mouth at bedtime. 08/03/23 09/02/23  Jerald Kief, MD  sulfamethoxazole-trimethoprim (BACTRIM) 400-80 MG tablet Take 1 tablet by mouth daily. 08/04/23 09/03/23  Jerald Kief, MD  terbinafine (LAMISIL) 1 % cream Apply topically 2 (two) times daily. 08/03/23   Jerald Kief, MD   Physical Exam: Temperature: 98.4.  Respiratory rate 16.  Heart rate 69.  Blood pressure 91/54.  MAP 66.  Physical Exam Vitals and nursing  note reviewed.  Constitutional:      General: He is not in acute distress.    Appearance: He is normal weight. He is not toxic-appearing.  HENT:     Head: Normocephalic and atraumatic.  Eyes:     General: No scleral icterus.    Extraocular Movements: Extraocular movements intact.     Conjunctiva/sclera: Conjunctivae normal.     Pupils: Pupils are  equal, round, and reactive to light.  Cardiovascular:     Rate and Rhythm: Normal rate and regular rhythm.     Heart sounds: No murmur heard.    No gallop.  Pulmonary:     Effort: Pulmonary effort is normal. No respiratory distress.     Breath sounds: Normal breath sounds. No wheezing, rhonchi or rales.  Musculoskeletal:     Right lower leg: No edema.     Left lower leg: No edema.  Skin:    General: Skin is warm and dry.  Neurological:     Mental Status: He is alert.     Cranial Nerves: No cranial nerve deficit or facial asymmetry.     Motor: No weakness or tremor.     Gait: Gait is intact.    Data Reviewed: BMP obtained yesterday with sodium of 136, potassium 4.0, bicarb 21, glucose 94, creatinine 0.16, with GFR above 60  CBC obtained on 08/08/2023 with WBC of 4.4, hemoglobin 13.1, platelets of 324  Labs obtained on 07/29/2023:  RPR reactive with titer of 1: 256 CD4 count decreased at 171 HIV quant elevated at 196,000 UDS positive for benzos, cocaine, THC Lumbar puncture with first set including 2000 RBC and 12 WBC Nonreactive VDRL Quant CSF Negative CSF cryptococcal antigen Negative meningitis/encephalitis panel  There are no new results to review at this time.  Assessment and Plan:  # Neurosyphilis  Patient was recently admitted and diagnosed with neurosyphilis on 07/28/2023 and discharged on 3/5 with plans for continued treatment at home via PICC line.  Unfortunately, patient's PICC line malfunctioned and he has not been receiving treatment.  Infectious disease was consulted at [facility] and recommended readmission to medical floor for treatment.  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  CD4 count has been extremely low for > 1 year, last 171 approximately 2 weeks ago.   - Continue Biktarvy - Continue prophylactic Bactrim  #  Polysubstance Abuse History of polysubstance abuse including cocaine as his primary drug.  He is very motivated to quit but has been having trouble finding a outpatient rehabilitation center given he is on the registry.  - Daily folic acid, thiamine and multivitamin  # Depression  - Psychiatry will continue to follow upon transfer to medical unit - Continue Zoloft and Seroquel  Advance Care Planning:   Code Status: Full Code   Consults: ID  Family Communication: No family at bedside  Severity of Illness: The appropriate patient status for this patient is INPATIENT. Inpatient status is judged to be reasonable and necessary in order to provide the required intensity of service to ensure the patient's safety. The patient's presenting symptoms, physical exam findings, and initial radiographic and laboratory data in the context of their chronic comorbidities is felt to place them at high risk for further clinical deterioration. Furthermore, it is not anticipated that the patient will be medically stable for discharge from the hospital within 2 midnights of admission.   *  I certify that at the point of admission it is my clinical judgment that the patient will require inpatient hospital care spanning beyond 2 midnights from the point of admission due to high intensity of service, high risk for further deterioration and high frequency of surveillance required.*  Author: Verdene Lennert, MD 08/11/2023 5:02 PM  For on call review [email or web address].

## 2023-08-11 NOTE — BH Assessment (Signed)
 Report given to eric. Patient going to room 143. DC information given to patient. Patient denies si,hi and avh at the moment.

## 2023-08-11 NOTE — Group Note (Signed)
 LCSW Group Therapy Note  Group Date: 08/11/2023 Start Time: 1400 End Time: 1430   Type of Therapy and Topic:  Group Therapy - How To Cope with Nervousness about Discharge   Participation Level:  Active   Description of Group This process group involved identification of patients' feelings about discharge. Some of them are scheduled to be discharged soon, while others are new admissions, but each of them was asked to share thoughts and feelings surrounding discharge from the hospital. One common theme was that they are excited at the prospect of going home, while another was that many of them are apprehensive about sharing why they were hospitalized. Patients were given the opportunity to discuss these feelings with their peers in preparation for discharge.  Therapeutic Goals  Patient will identify their overall feelings about pending discharge. Patient will think about how they might proactively address issues that they believe will once again arise once they get home (i.e. with parents). Patients will participate in discussion about having hope for change.   Summary of Patient Progress:  Patient began group with a question on if the unit is "actually treating [place] or just throwing medication at [place]?" CSW asked patient to clarify his statement and patient was unable or unwilling to.  CSW asked if there is a need that patient feels he has that has not been met on the unit. Patient expressed frustration at being redirected to outside agencies to meet certain needs such as social services.  CSW explained that hospital CSW role is to do discharge planning and that specific needs are out of our scope such as food stamps, that will need to be addressed by social services, though CSW can provide patient with the contact information to begin the process. CSW elaborated on role of CSW at the hospital.  Patient appeared upset and left the group at this time. Patient displayed poor insight.  Therapeutic  Modalities Cognitive Behavioral Therapy   Harden Mo, LCSW 08/11/2023  3:35 PM

## 2023-08-11 NOTE — Progress Notes (Signed)
 [facility] MD Progress Note  08/11/2023 2:35 PM Lee Bridges  MRN:  [id number]   33 year old never married African-American male with a self-reported history of " schizophrenia, bipolar with psychotic features", polysubstance abuse (methamphetamine, cocaine, marijuana), medical history of AIDS, neurosyphilis, noncompliance with medications, presented to [facility] on 3/10 with chief complaints of suicidal ideation without plan " I just want to die".  Reported to them that he wanted to kill himself once he was discharged, endorsed visual hallucinations.  While there patient was reportedly agitated, threatening to slap and punch staff members.  UDS positive for amphetamine, benzodiazepine, cocaine, methamphetamine, THC.    Subjective: Patient's case discussed in multidisciplinary team meeting, all vitals and notes were reviewed.  No reported behavioral issues overnight.  Patient seen for reassessment, reports that he is in a lot of pain.  Was administered penicillin injection yesterday, which has caused significant right hip/buttock pain, causing him to limp.  States that he was told by infectious disease physician that he would be getting a once weekly injection for the next 3 weeks, and that he is agreeable to this.  Reports good sleep overnight, denies any oversedation this morning.  Was able to get up early and have breakfast.  Appetite is good.  Denies any paranoia.  Continues to endorse depressed mood which he rates as a 6 out of 10, seems to have improved today.  Denies any anxiety.  Denies SI/HI and AVH.  Alert and oriented x 4.  Social work team reports that the patient was also denied by day Loraine Leriche due to his history of noncompliance with medication regimen.  Principal Problem: MDD (major depressive disorder), recurrent episode, moderate (HCC) Diagnosis: Principal Problem:   MDD (major depressive disorder), recurrent episode, moderate (HCC) Active Problems:   History of  syphilis   Cocaine abuse (HCC)   AIDS (acquired immune deficiency syndrome) (HCC)   Suicidal ideations   Methamphetamine abuse (HCC)   Marijuana use  Total Time spent with patient: 30 minutes  Past Psychiatric History:  DX: Bipolar disorder Outpatient provider: Behavioral health in [place], [place] Current caregiver:  Patient is own guardian/ care giver Past hospitalizations: X 1 at behavioral health in [place] in 2001 when he attempted to hang himself in a hotel, at the time he contacted [employer] to let them know his plan  Medication trials : Seroquel, Trintellix, Zyprexa Suicide attempts: 2001 when he attempted to hang himself in a hotel, at the time he contacted [employer] to let them know his plan  Patient denies ever having an Act/CST team. Denies ECT, Clozaril treatments.  Past Medical History:  Past Medical History:  Diagnosis Date   Depression    HIV (human immunodeficiency virus infection) (HCC)    Psychosis (HCC)    Substance abuse (HCC)    History reviewed. No pertinent surgical history. Family History:  Family History  Problem Relation Age of Onset   Arthritis Mother    Diabetes Father    Hypertension Father    Family Psychiatric  History:  Psych dx: maternal cousin - schizophrenia, father - schizophrenia Suicide: maternal cousin, hung self in 2003 Substance use: mother - EtOH, cocaine; father - EtOH   Social History:  Social History   Substance and Sexual Activity  Alcohol Use Not Currently   Comment: socially     Social History   Substance and Sexual Activity  Drug Use Yes   Types: Cocaine, Marijuana, Methamphetamines   Comment: weekly    Social History  Socioeconomic History   Marital status: Single    Spouse name: Not on file   Number of children: 0   Years of education: Not on file   Highest education level: Associate degree: academic program  Occupational History   Not on file  Tobacco Use   Smoking status: Every Day    Current packs/day:  0.50    Types: Cigarettes   Smokeless tobacco: Never   Tobacco comments:    1 PPD  Substance and Sexual Activity   Alcohol use: Not Currently    Comment: socially   Drug use: Yes    Types: Cocaine, Marijuana, Methamphetamines    Comment: weekly   Sexual activity: Not Currently  Other Topics Concern   Not on file  Social History Narrative   Homeless x 2 yrs.    Social Drivers of Corporate investment banker Strain: Not on file  Food Insecurity: No Food Insecurity (08/09/2023)   Hunger Vital Sign    Worried About Running Out of Food in the Last Year: Never true    Ran Out of Food in the Last Year: Never true  Recent Concern: Food Insecurity - Food Insecurity Present (07/29/2023)   Hunger Vital Sign    Worried About Running Out of Food in the Last Year: Sometimes true    Ran Out of Food in the Last Year: Sometimes true  Transportation Needs: No Transportation Needs (08/09/2023)   PRAPARE - Administrator, Civil Service (Medical): No    Lack of Transportation (Non-Medical): No  Recent Concern: Transportation Needs - Unmet Transportation Needs (07/29/2023)   PRAPARE - Transportation    Lack of Transportation (Medical): Yes    Lack of Transportation (Non-Medical): Yes  Physical Activity: Not on file  Stress: Not on file  Social Connections: Socially Isolated (08/09/2023)   Social Connection and Isolation Panel [NHANES]    Frequency of Communication with Friends and Family: Never    Frequency of Social Gatherings with Friends and Family: Never    Attends Religious Services: Never    Database administrator or Organizations: No    Attends Engineer, structural: Never    Marital Status: Never married   Additional Social History:                         Sleep: Good  Appetite:  Good  Current Medications: Current Facility-Administered Medications  Medication Dose Route Frequency Provider Last Rate Last Admin   bictegravir-emtricitabine-tenofovir AF  (BIKTARVY) 50-200-25 MG per tablet 1 tablet  1 tablet Oral Daily Mandalyn Pasqua, PA-C   1 tablet at 08/11/23 0913   diphenhydrAMINE (BENADRYL) capsule 50 mg  50 mg Oral Q6H PRN Marcelle Hepner, PA-C       LORazepam (ATIVAN) tablet 2 mg  2 mg Oral Q6H PRN Sherryn Pollino, PA-C   2 mg at 08/09/23 1514   melatonin tablet 5 mg  5 mg Oral QHS Josslynn Mentzer, PA-C   5 mg at 08/10/23 2053   methocarbamol (ROBAXIN) tablet 500 mg  500 mg Oral Q6H PRN Thekla Colborn, PA-C   500 mg at 08/11/23 1257   nicotine polacrilex (NICORETTE) gum 4 mg  4 mg Oral PRN Aybree Lanyon, PA-C   4 mg at 08/10/23 0829   OLANZapine (ZYPREXA) injection 10 mg  10 mg Intramuscular TID PRN Tarisha Fader, PA-C       OLANZapine (ZYPREXA) injection 5 mg  5 mg Intramuscular TID PRN Josey Dettmann, Judeth Cornfield,  PA-C       OLANZapine zydis (ZYPREXA) disintegrating tablet 5 mg  5 mg Oral TID PRN Magnum Lunde, PA-C       ondansetron (ZOFRAN-ODT) disintegrating tablet 4 mg  4 mg Oral Q8H PRN Daneshia Tavano, PA-C       penicillin g benzathine (BICILLIN LA) 1200000 UNIT/2ML injection 2.4 Million Units  2.4 Million Units Intramuscular Weekly Lynn Ito, MD   2.4 Million Units at 08/10/23 2053   QUEtiapine (SEROQUEL) tablet 100 mg  100 mg Oral QHS Waqas Bruhl, PA-C   100 mg at 08/10/23 2053   sertraline (ZOLOFT) tablet 50 mg  50 mg Oral Daily Meliana Canner, PA-C   50 mg at 08/11/23 1191   sulfamethoxazole-trimethoprim (BACTRIM) 400-80 MG per tablet 1 tablet  1 tablet Oral Daily Lorie Melichar, PA-C   1 tablet at 08/11/23 0300    Lab Results:  Results for orders placed or performed during the hospital encounter of 08/09/23 (from the past 48 hours)  Basic metabolic panel     Status: Abnormal   Collection Time: 08/10/23 11:11 AM  Result Value Ref Range   Sodium 136 135 - 145 mmol/L   Potassium 4.0 3.5 - 5.1 mmol/L   Chloride 105 98 - 111 mmol/L   CO2 21 (L) 22 - 32 mmol/L    Glucose, Bld 94 70 - 99 mg/dL    Comment: Glucose reference range applies only to samples taken after fasting for at least 8 hours.   BUN 18 6 - 20 mg/dL   Creatinine, Ser 4.78 0.61 - 1.24 mg/dL   Calcium 9.2 8.9 - 29.5 mg/dL   GFR, Estimated >62 >13 mL/min    Comment: (NOTE) Calculated using the CKD-EPI Creatinine Equation (2021)    Anion gap 10 5 - 15    Comment: Performed at [facility], [address] [place], [place] [address]    Blood Alcohol level:  Lab Results  Component Value Date   [facility] <10 08/08/2023   ETH <10 01/25/2021    Metabolic Disorder Labs: No results found for: "HGBA1C", "MPG" No results found for: "PROLACTIN" No results found for: "CHOL", "TRIG", "HDL", "CHOLHDL", "VLDL", "LDLCALC"  Physical Findings: AIMS:  , ,  ,  ,    CIWA:    COWS:     Musculoskeletal: Strength & Muscle Tone: within normal limits Gait & Station: normal Patient leans: N/A  Psychiatric Specialty Exam:  Presentation  General Appearance:  Appropriate for Environment  Eye Contact: Good  Speech: Normal Rate  Speech Volume: Normal  Handedness: Right   Mood and Affect  Mood: Labile  Affect: Labile   Thought Process  Thought Processes: Goal Directed  Descriptions of Associations:Intact  Orientation:Full (Time, Place and Person)  Thought Content:Paranoid Ideation  History of Schizophrenia/Schizoaffective disorder:No  Duration of Psychotic Symptoms:Less than six months  Hallucinations:No data recorded  Ideas of Reference:Paranoia; Percusatory  Suicidal Thoughts:No data recorded  Homicidal Thoughts:No data recorded   Sensorium  Memory: Immediate Good; Recent Good; Remote Good  Judgment: Impaired  Insight: Fair   Art therapist  Concentration: Good  Attention Span: Good  Recall: Good  Fund of Knowledge: Fair  Language: Fair   Psychomotor Activity  Psychomotor Activity: No data recorded   Assets   Assets: Communication Skills; Leisure Time   Sleep  Sleep: No data recorded    Physical Exam: Physical Exam Vitals and nursing note reviewed.  HENT:     Head: Normocephalic and atraumatic.  Eyes:  Extraocular Movements: Extraocular movements intact.  Pulmonary:     Effort: Pulmonary effort is normal.  Musculoskeletal:     Cervical back: Normal range of motion.  Skin:    General: Skin is dry.  Neurological:     Mental Status: He is alert and oriented to person, place, and time.     Gait: Gait abnormal.  Psychiatric:        Attention and Perception: Attention and perception normal.        Mood and Affect: Mood is depressed. Affect is labile.        Speech: Speech normal.        Behavior: Behavior normal. Behavior is cooperative.        Thought Content: Thought content normal.        Cognition and Memory: Cognition and memory normal.        Judgment: Judgment is impulsive.    Review of Systems  Psychiatric/Behavioral:  Positive for depression and substance abuse.   All other systems reviewed and are negative.  Blood pressure (!) 91/54, pulse 69, temperature 98.4 F (36.9 C), resp. rate 16, height 5\' 2"  (1.575 m), weight 53.1 kg, SpO2 99%. Body mass index is 21.4 kg/m.   Treatment Plan Summary:  Principal Problem:   MDD (major depressive disorder), recurrent episode, moderate (HCC) Active Problems:   History of syphilis   Cocaine abuse (HCC)   AIDS (acquired immune deficiency syndrome) (HCC)   Suicidal ideations   Methamphetamine abuse (HCC)   Marijuana use   1.    Safety and Monitoring:   --  Voluntary admission to inpatient psychiatric unit for safety, stabilization and treatment -- Daily contact with patient to assess and evaluate symptoms and progress in treatment -- Patient's case to be discussed in multi-disciplinary team meeting -- Observation Level : q15 minute checks   -- Vital signs:  q12 hours -- Precautions: suicide   2. Psychiatric  Diagnoses and Treatment:   08/11/2023 -- Continue Seroquel 100 mg HS for mood stabilization/sleep/depressive sxs/psychosis -- Continue Zoloft 50 mg every day for depressive sxs -- Continue melatonin 5 mg HS for sleep aid  -- Continue Lorazepam 2 mg q6h prn for agitation/anxiety   08/10/2023 -- Increase Seroquel 50 mg to 100 mg HS for mood stabilization/sleep/depressive sxs/psychosis -- Continue Zoloft 50 mg every day for depressive sxs -- Continue melatonin 5 mg HS for sleep aid  -- Continue Lorazepam 2 mg q6h prn for agitation/anxiety   08/09/2023 -- Continue Seroquel 50 mg HS for mood/sleep/depressive sxs/psychosis -- Start Zoloft 50 mg every day for depressive sxs -- Continue melatonin 5 mg HS for sleep aid  -- Start Lorazepam 2 mg q6h prn for agitation/anxiety    --  The risks/benefits/side-effects/alternatives to this medication were discussed in detail with the patient and time was given for questions. The patient consents to medication trial. -- Metabolic profile and EKG monitoring obtained while on an atypical antipsychotic  (BMI: 21.40; Lipid Panel: pending;  HbgA1c: pending; QTc: pending) -- Encouraged patient to participate in unit milieu and in scheduled group therapies -- Short Term Goals: Ability to identify changes in lifestyle to reduce recurrence of condition will improve, Ability to verbalize feelings will improve, Ability to disclose and discuss suicidal ideas, Ability to demonstrate self-control will improve, Ability to identify and develop effective coping behaviors will improve, Ability to maintain clinical measurements within normal limits will improve, Compliance with prescribed medications will improve, and Ability to identify triggers associated with substance abuse/mental health issues  will improve -- Long Term Goals: Improvement in symptoms so as ready for discharge        3. Medical Issues Being Addressed:               Hx of neurosyphilis/untreated due to non  compliance  3/12 -- seen by ID, recommendations/pla: to follow up with ID colleage who saw pt, then determine need to restart antibiotics, 'to see whether they can do FTA-ABS on the csf-from 2/28 if still viable - treat the syphilis with Benzathine penicillin 2.4 million units ( will need weekly X3). This is needed even if we treat neurosyphilis with Pen G '             3/11 -- Consult ID              -- Continue home med Doxycycline 200 mg BID and Bactrim 400-80 mg daily                HIV/AIDS             -- Continue home med Biktarvy 1 tablet daily              -- Consult ID                Tobacco Use Disorder             -- Nicotine gum 2mg  PRN             -- Smoking cessation encouraged   4. Discharge Planning:   -- Social work and case management to assist with discharge planning and identification of hospital follow-up needs prior to discharge -- Estimated LOS: 5-7 days -- Discharge Concerns: Need to establish a safety plan; Medication compliance and effectiveness -- Discharge Goals: Return home with outpatient referrals for mental health follow-up including medication management/psychotherapy     Paulene Floor, PA-C 08/11/2023, 2:35 PM

## 2023-08-12 ENCOUNTER — Other Ambulatory Visit: Payer: Self-pay

## 2023-08-12 ENCOUNTER — Inpatient Hospital Stay: Admitting: Family

## 2023-08-12 ENCOUNTER — Encounter: Payer: Self-pay | Admitting: Internal Medicine

## 2023-08-12 DIAGNOSIS — F321 Major depressive disorder, single episode, moderate: Secondary | ICD-10-CM

## 2023-08-12 DIAGNOSIS — B20 Human immunodeficiency virus [HIV] disease: Secondary | ICD-10-CM | POA: Diagnosis not present

## 2023-08-12 DIAGNOSIS — A523 Neurosyphilis, unspecified: Secondary | ICD-10-CM | POA: Diagnosis not present

## 2023-08-12 DIAGNOSIS — F1910 Other psychoactive substance abuse, uncomplicated: Secondary | ICD-10-CM | POA: Diagnosis not present

## 2023-08-12 LAB — CBC
HCT: 30.3 % — ABNORMAL LOW (ref 39.0–52.0)
Hemoglobin: 10.4 g/dL — ABNORMAL LOW (ref 13.0–17.0)
MCH: 34.8 pg — ABNORMAL HIGH (ref 26.0–34.0)
MCHC: 34.3 g/dL (ref 30.0–36.0)
MCV: 101.3 fL — ABNORMAL HIGH (ref 80.0–100.0)
Platelets: 265 10*3/uL (ref 150–400)
RBC: 2.99 MIL/uL — ABNORMAL LOW (ref 4.22–5.81)
RDW: 11.9 % (ref 11.5–15.5)
WBC: 4.6 10*3/uL (ref 4.0–10.5)
nRBC: 0 % (ref 0.0–0.2)

## 2023-08-12 LAB — LIPID PANEL
Cholesterol: 167 mg/dL (ref 0–200)
HDL: 30 mg/dL — ABNORMAL LOW (ref >40)
LDL Cholesterol: 89 mg/dL (ref 0–99)
Total CHOL/HDL Ratio: 5.6 ratio
Triglycerides: 239 mg/dL — ABNORMAL HIGH (ref <150)
VLDL: 48 mg/dL — ABNORMAL HIGH (ref 0–40)

## 2023-08-12 LAB — FOLATE: Folate: 8.7 ng/mL (ref >5.9)

## 2023-08-12 LAB — BASIC METABOLIC PANEL
Anion gap: 8 (ref 5–15)
BUN: 19 mg/dL (ref 6–20)
CO2: 23 mmol/L (ref 22–32)
Calcium: 9 mg/dL (ref 8.9–10.3)
Chloride: 106 mmol/L (ref 98–111)
Creatinine, Ser: 1.06 mg/dL (ref 0.61–1.24)
GFR, Estimated: >60 mL/min (ref >60)
Glucose, Bld: 88 mg/dL (ref 70–99)
Potassium: 4 mmol/L (ref 3.5–5.1)
Sodium: 137 mmol/L (ref 135–145)

## 2023-08-12 LAB — IRON AND TIBC
Iron: 59 ug/dL (ref 45–182)
Saturation Ratios: 27 % (ref 17.9–39.5)
TIBC: 218 ug/dL — ABNORMAL LOW (ref 250–450)
UIBC: 159 ug/dL

## 2023-08-12 LAB — MAGNESIUM: Magnesium: 1.7 mg/dL (ref 1.7–2.4)

## 2023-08-12 LAB — HEMOGLOBIN A1C
Hgb A1c MFr Bld: 4.6 % — ABNORMAL LOW (ref 4.8–5.6)
Mean Plasma Glucose: 85.32 mg/dL

## 2023-08-12 LAB — PHOSPHORUS: Phosphorus: 3.8 mg/dL (ref 2.5–4.6)

## 2023-08-12 LAB — VITAMIN B12: Vitamin B-12: 251 pg/mL (ref 180–914)

## 2023-08-12 MED ORDER — SULFAMETHOXAZOLE-TRIMETHOPRIM 400-80 MG PO TABS: 1.0000 | ORAL_TABLET | Freq: Every day | ORAL | Status: DC

## 2023-08-12 MED ORDER — OLANZAPINE 5 MG PO TBDP: 5.0000 mg | ORAL_TABLET | Freq: Three times a day (TID) | ORAL | Status: DC | PRN

## 2023-08-12 MED ORDER — LORAZEPAM 2 MG PO TABS
2.0000 mg | ORAL_TABLET | Freq: Four times a day (QID) | ORAL | Status: DC | PRN
  Administered 2023-08-21 – 2023-08-22 (×2): 2 mg via ORAL
  Filled 2023-08-12 (×2): qty 1

## 2023-08-12 MED ORDER — MELATONIN 5 MG PO TABS
5.0000 mg | ORAL_TABLET | Freq: Every day | ORAL | Status: DC
  Administered 2023-08-12 – 2023-08-21 (×8): 5 mg via ORAL
  Filled 2023-08-12 (×9): qty 1

## 2023-08-12 MED ORDER — ADULT MULTIVITAMIN W/MINERALS CH
1.0000 | ORAL_TABLET | Freq: Every day | ORAL | Status: DC
  Administered 2023-08-13 – 2023-08-21 (×9): 1 via ORAL
  Filled 2023-08-12 (×9): qty 1

## 2023-08-12 MED ORDER — QUETIAPINE FUMARATE 25 MG PO TABS: 100.0000 mg | ORAL_TABLET | Freq: Every day | ORAL | Status: DC

## 2023-08-12 MED ORDER — NICOTINE POLACRILEX 2 MG MT GUM
4.0000 mg | CHEWING_GUM | OROMUCOSAL | Status: DC | PRN
  Administered 2023-08-20: 4 mg via ORAL
  Filled 2023-08-12 (×2): qty 2

## 2023-08-12 MED ORDER — METHOCARBAMOL 500 MG PO TABS
500.0000 mg | ORAL_TABLET | Freq: Four times a day (QID) | ORAL | Status: DC | PRN
  Administered 2023-08-13 – 2023-08-22 (×8): 500 mg via ORAL
  Filled 2023-08-12 (×8): qty 1

## 2023-08-12 MED ORDER — BICTEGRAVIR-EMTRICITAB-TENOFOV 50-200-25 MG PO TABS
1.0000 | ORAL_TABLET | Freq: Every day | ORAL | Status: DC
  Filled 2023-08-12: qty 1

## 2023-08-12 MED ORDER — SERTRALINE HCL 50 MG PO TABS: 50.0000 mg | ORAL_TABLET | Freq: Every day | ORAL | Status: DC

## 2023-08-12 NOTE — Progress Notes (Signed)
   08/12/23 1045  Spiritual Encounters  Type of Visit Follow up  Care provided to: Patient  Conversation partners present during encounter Nurse  Reason for visit Routine spiritual support  OnCall Visit No   Chaplain was told by Nursing staff, patient would benefit from a visit.  Chaplain recognized that she'd seen this patient in [facility] earlier in the week.  Chaplain asked the Unit Chaplain if they wanted to go instead but Unit Chaplain saw the importance of continuity of care in the other Chaplain going.    Rev. Rana M. Earlene Plater, M.Div. Chaplain Resident [facility]

## 2023-08-12 NOTE — Plan of Care (Signed)

## 2023-08-12 NOTE — Progress Notes (Signed)
 Date of Admission:  08/11/2023    ID: Lee Bridges is a 33 y.o. male  Principal Problem:   Neurosyphilis Active Problems:   HIV (human immunodeficiency virus infection) (HCC)   MDD (major depressive disorder), recurrent episode, moderate (HCC)   AIDS (acquired immune deficiency syndrome) (HCC)   Polysubstance abuse (HCC)    Subjective: Pt getting IV penicillin Doing well'. No complaints  Medications:   bictegravir-emtricitabine-tenofovir AF  1 tablet Oral Daily   enoxaparin (LOVENOX) injection  40 mg Subcutaneous Q24H   folic acid  1 mg Oral Daily   [START ON 08/13/2023] multivitamin with minerals  1 tablet Oral QHS   QUEtiapine  100 mg Oral QHS   sertraline  50 mg Oral Daily   sodium chloride flush  3 mL Intravenous Q12H   sulfamethoxazole-trimethoprim  1 tablet Oral Daily   thiamine  100 mg Oral Daily    Objective: Vital signs in last 24 hours: Patient Vitals for the past 24 hrs:  BP Temp Pulse Resp SpO2  08/12/23 1204 122/65 98.1 F (36.7 C) (!) 108 18 99 %  08/12/23 0301 105/75 97.9 F (36.6 C) 84 18 100 %      PHYSICAL EXAM:  General: Alert, cooperative, no distress, appears stated age.  Head: Normocephalic, without obvious abnormality, atraumatic. Eyes: Conjunctivae clear, anicteric sclerae. Pupils are equal ENT Nares normal. No drainage or sinus tenderness. Lips, mucosa, and tongue normal. No Thrush Neck: Supple, symmetrical, no adenopathy, thyroid: non tender no carotid bruit and no JVD. Back: No CVA tenderness. Lungs: Clear to auscultation bilaterally. No Wheezing or Rhonchi. No rales. Heart: Regular rate and rhythm, no murmur, rub or gallop. Abdomen: Soft, non-tender,not distended. Bowel sounds normal. No masses Extremities: atraumatic, no cyanosis. No edema. No clubbing Skin: No rashes or lesions. Or bruising Lymph: Cervical, supraclavicular normal. Neurologic: Grossly non-focal  Lab Results    Latest Ref Rng & Units 08/12/2023    5:10 AM 08/08/2023     8:43 PM 08/05/2023   11:52 AM  CBC  WBC 4.0 - 10.5 K/uL 4.6  4.4  4.0   Hemoglobin 13.0 - 17.0 g/dL 16.1  09.6  04.5   Hematocrit 39.0 - 52.0 % 30.3  39.1  35.3   Platelets 150 - 400 K/uL 265  324  254        Latest Ref Rng & Units 08/12/2023    5:10 AM 08/10/2023   11:11 AM 08/08/2023    8:43 PM  CMP  Glucose 70 - 99 mg/dL 88  94  95   BUN 6 - 20 mg/dL 19  18  14    Creatinine 0.61 - 1.24 mg/dL 4.09  8.11  9.14   Sodium 135 - 145 mmol/L 137  136  137   Potassium 3.5 - 5.1 mmol/L 4.0  4.0  4.1   Chloride 98 - 111 mmol/L 106  105  102   CO2 22 - 32 mmol/L 23  21  26    Calcium 8.9 - 10.3 mg/dL 9.0  9.2  9.5   Total Protein 6.5 - 8.1 g/dL   8.1   Total Bilirubin 0.0 - 1.2 mg/dL   0.5   Alkaline Phos 38 - 126 U/L   59   AST 15 - 41 U/L   22   ALT 0 - 44 U/L   30       Microbiology:  Studies/Results: No results found.   Assessment/Plan: AIDS poorly compliant.  On Biktarvy.  Last viral load from  07/29/2023 was 196,000 and CD4 was 171 ( 14%)   Partially treated neurosyphilis.  Received 6 days of IV penicillin from 2/27 until 08/02/2023.  Was sent home with a midline line by the infectious disease team at [facility] but he did not take treatment and he returned to the ED for malfunctioning  line was removed . He left pretty much AMA from the ED and so was prescribed Doxycycline 200mg  BID for 28 days which he did not fill- Doxy is not the standard in [place] guidelines for neurosyphilis ( [place] guidelines 3rd line) Clinically he has no evidence of meningitis or GPI or TD CSF examination showed minimal lymphocytic pleocytosis ( which can be seen in HIV patients  perse) , neg VDRL, normal protein   Can he have a false neg VDRL?   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?- Spoke to Dr.vu and opthal exam done at [facility] was normal  Dr>Grover from [facility] said FTA - ABS test is not performed on csf anymore    This test has much higher sensitivity of nearly 99% compared to VDRL an dif neg will r/o  neurosyphilis Will treat like neurosyphilis with penicillin Iv 12 million units every 12 hr ocntinuous infusion for 10 days total   DC Doxy HE also received a dose of benzathine penicillin yesterday IM and will do 2 more doses on a weekly basis     Polysubstance use ( cocaine, meth) and homelessness and mental health issues- cannot send him home with IV antibiotic or PICC line   MDD, bipolar now with suicidal ideation on quetiapine, sertraline and olanzapine   Discussed the management with the patient

## 2023-08-12 NOTE — Discharge Instructions (Addendum)
 Intensive Outpatient Programs   [facility] [address] [place],  [place], [place] [phone]  [facility] (Inpatient and outpatient)[phone] (Suboxone and Methadone) [address] [phone]  ADS: [facility] - Intensive Outpatient [address] [place], [place] [address], [place]  [phone]  [facility] Margo Aye (Outpatient, Inpatient, [facility] (Groups and Residental) (insurance only) [phone] [place], [place] [phone]   [facility] (for caregivers and family) [address] Laurell Josephs [address] [place], [place] [phone]  Residential Treatment Programs  [facility](2 years) Residential: 43 days)[facility] [address] [place], [place], [place] [phone] or [phone]  [facility] [address] [place], [place], [place] [phone]  [facility] ([facility]) [address] [place], [place], [place] [phone] Admissions: 8am-3pm M-F  [facility]ATS Program: Residential Program [phone] Days)             [facility]: [facility]  [place], [place], [place]  [phone] or [phone] in Hours over the weekend or by referral)  [facility] [address] [place], [place] [address] [phone] (Do virtual or phone assessment, offer transportation within 25 miles, have in patient and Outpatient options)   Mobil Crisis: [facility]:[phone] (for crisis  response 24 hours a day)     Rent/Utility/Housing  Agency Name: [facility] Agency Address: [address] [place], [place] [address] Phone: [phone] Email: [email or web address] Website: www.[email or web address] Service(s) Offered: Housing services, self-sufficiency, congregate meal program, weatherization program, Field seismologist program, emergency food assistance,  housing counseling, home ownership program, wheels -towork program.  Agency Name: [facility] Address: [address] [place], [place] [address] Phone: [phone] (8a-4p) [phone] (8p- 10p) Email: [email or web address] Website: [email or web address] Service(s) Offered: A program for homeless and/or needy men that includes one-on-one counseling, life skills training and job rehabilitation.  Agency Name: [employer] [facility] [place] Address: [address], [place], [place] [address] Phone: [phone] Website: www.alliedchurches.org Service(s) Offered: Assistance to needy in emergency with utility bills, heating fuel, and prescriptions. Shelter for homeless 7pm-7am. September 23, 2016 15  Agency Name: [facility] [place] (Developmentally Disabled) Address: [address], [place], [place] [address] Phone: [phone] Contact Person: Cathleen Corti Email: [email or web address] Website: [email or web address] Service(s) Offered: Helps individuals with developmental disabilities move from housing that is more restrictive to homes where they  can achieve greater independence and have more  opportunities.  Agency Name: [facility] Address: [address], [place], [place] [address] Phone: [phone] Email: [email or web address] Website: www.[email or web address].org Service(s) Offered: Provides affordable housing for low-income families, elderly, and disabled individuals. Offer a wide range of  programs and services,  from financial planning to afterschool and summer programs.  Agency Name: Department of Social Services Address: [address] [place], [place] [address] Phone: [phone] Service(s) Offered: Child support services; child welfare services; food stamps; Medicaid; work first family assistance; and aid with fuel,  rent, food and medicine.  Agency Name: [facility] [place], [employer]. Address: [facility] [address] [place], [place]  [address] Phone: [phone] Website: www.familyabuseservices.org Service(s) Offered: 24 hour Crisis Line: [phone]; 24 hour Emergency Shelter; Transitional Housing; Support Groups; Scientist, physiological; [employer]; Hispanic Outreach: [phone];  Visitation Center:  [phone].  Agency Name: [facility], [place]. Address: [address] [place], [place] [address] Phone: [phone] Service(s) Offered: CAP Services; Home and [employer]; Individual or Group Supports; Respite Care Non-Institutional Nursing;  Residential Supports; Respite Care and Personal Care Services; Transportation; Family and Friends Night; Recreational Activities; Three Nutritious Meals/Snacks; Consultation with Registered Dietician; Twenty-four hour Registered Nurse Access; Daily and [employer] Products and [employer]s; Camp Green Leaves; [place] for the [employer] (During Summer Months) Bingo Night (Every  Wednesday Night); Special Populations Dance Night  (Every Tuesday Night); Professional Hair Care Services.  Agency Name: [facility] Address: [address], [place], [place] [address] Phone: [phone] Contact Person: Jabier Mutton Website: [email or web address].Physicist, medical) Offered: Residential treatment facility for women; food and  clothing, educational & employment development and  transportation to work; Counsellor of financial skills;  parenting and family reunification; emotional  and spiritual  support; transitional housing for program graduates.  Agency Name: [facility] Address: [address], [place], [place] [address] Phone: [phone] Email: [email or web address] Website: TaskTown.es Service(s) Offered: Public housing units for elderly, disabled, and low income people; housing choice vouchers for income eligible  applicants; shelter plus care vouchers; and Psychologist, clinical.  Agency Name: [facility] [employer] Address: [address] [place], [place] [address] Phone: [phone] Email: [email or web address] Website: www.habitatalamance.org Service(s) Offered: Build houses for families in need of decent housing. Each adult in the family must invest 200 hours of labor on  someone else's house, work with volunteers to build their own house, attend classes on budgeting, home maintenance, yard care, and attend homeowner association meetings.  Agency Name: [facility] Address: [address] [place], [place] [address] Phone: [phone] Website: www.rsli.org Service(s) Offered: Intermediate care facilities for intellectually delayed, Supervised Living in group homes for adults with developmental disabilities, Supervised Living for people who have dual diagnoses (MRMI), Independent Living, Supported Living, respite and a variety of CAP services, pre-vocational services, day supports, and [employer].  Agency Name: [place] [facility] Phone: [phone] Website: www.NCForeclosurePrevention.gov Service(s) Offered: Zero-interest, deferred loans to homeowners struggling to pay their mortgage. Call for more information.   Transportation Resources  Agency Name: [facility] Agency Address: [address] [place], [place] [address] Phone: [phone] Email: [email or web address] Website: www.alamanceservices.org Service(s) Offered: Housing services,  self-sufficiency, congregate meal program, weatherization program, Field seismologist program, emergency food assistance,  housing counseling, home ownership program, wheels-towork program.  Agency Name: [facility] [employer] [phone]) Address: [address], [place], [place] [address] Phone: [phone] Website: www.acta-Social Circle.com Service(s) Offered: Transportation for [employer], subscription and demand response; Dial-a-Ride for citizens 81 years of age or older.  Agency Name: Department of Social Services Address: [address] [place], [place] [address] Phone: [phone] Service(s) Offered: Child support services; child welfare services; food stamps; Medicaid; work first family assistance; and aid with fuel,  rent, food and medicine, transportation assistance.  Agency Name: Disabled [employer] (DAV) Transportation  Network Phone: [phone] Service(s) Offered: Transports veterans to the [facility] medical center. Call  forty-eight hours in advance and leave the name, telephone  number, date, and time of appointment. Veteran will be  contacted by the driver the day before the appointment to  arrange a pick up point   Transportation Resources  Agency Name: [facility] Agency Address: [address] [place], [place] [address] Phone: [phone] Email: [email or web address] Website: www.[email or web address].org Service(s) Offered: Family Dollar Stores, self-sufficiency, congregate meal program, weatherization program,  heating appliance  repair/replacement program, emergency food assistance,  housing counseling, home ownership program, wheels-towork program.  Agency Name: [facility] [employer] [phone]) Address: [address], [place], [place] [address] Phone: [phone] Website: www.acta-Barton.com Service(s) Offered: Transportation for [employer], subscription and demand response;  Dial-a-Ride for citizens 36 years of age or older.  Agency Name: Department of Social Services Address: [address] [place], [place] [address] Phone: [phone] Service(s) Offered: Child support services; child welfare services; food stamps; Medicaid; work first family assistance; and aid with fuel,  rent, food and medicine, transportation assistance.  Agency Name: Disabled [employer] (DAV) Transportation  Network Phone: [phone] Service(s) Offered: Transports veterans to the [facility] medical center. Call  forty-eight hours in advance and leave the name, telephone  number, date, and time of appointment. Veteran will be  contacted by the driver the day before the appointment to  arrange a pick up point    [employer] ACTA currently provides door to door services. ACTA connects with PART daily for services to [facility]. ACTA also performs contract services to [employer] operates 27 vehicles, all but 3 mini-vans are equipped with lifts for special needs as well as the general public. ACTA drivers are each CDL certified and trained in First Aid and CPR. ACTA was established in 2002 by [employer]. An independent Industrial/product designer. ACTA operates via Cytogeneticist with required Research scientist (physical sciences) from [place]. ACTA provides over 80,000 passenger trips each year, including [facility] Adult Day Services and [employer] sites.  Call at least by 11 AM one business day prior to needing transportation  [employer].                      [place], [place] [address]     Office Hours: Monday-Friday  8 AM - 5 PM   Agency Name: [facility] Agency Address: [address], [place], [place] [address] Phone: [phone] Website: www.alamanceservices.org Service(s) Offered: Housing services, self-sufficiency, congregate meal  program, and individual development account program.  Agency Name: [facility] [place] Address: [address] [place], [place] [address] Phone: [phone] Email: Nita Sells .org Website: www.alliedchurches.org Service(s) Offered: Housing the homeless, feeding the hungry, Company secretary, job and education related services.  Agency Name: [facility] Address: [address] [place], [place] [address] Phone: [phone] Email: [email or web address] Service(s) Offered: Counseling, problem pregnancy, advocacy for Hispanics, limited emergency financial assistance.  Agency Name: Department of Social Services Address: [address] [place], [place] [address] Phone: [phone] Website: [email or web address].[email or web address].[email or web address] Service(s) Offered: Child support services; child welfare services; SNAP; Medicaid; work first family assistance; and aid with fuel,  rent, food and medicine.  Agency Name: Holiday representative Address: [address] [place], [place] [address] Phone: [phone] or [phone] Email: [email or web address] Service(s) Offered: Family services and transient assistance; emergency food, fuel, clothing, limited furniture, utilities; budget counseling, general counseling; give a kid a coat; thrift store; Christmas food and toys. Utility assistance, food pantry, rental  assistance, life sustaining medicine    Are you feeling socially Isolated? The Institute on Aging offers a [employer] that anyone can call toll free at [phone]. The friendship line is available 24 hours a day   . Online zoom yoga class, connect with others without leaving your home [facility] offers [place] dance cardio sessions for individuals via Zoom. This program provides: -  Dance fitness activities Please contact program for more information. Servinganyone in need adults 18+ hiv/aids individuals families Call [phone]   Email [email or web address] to get more info  Humana offers an online T[employer] Brothers to individuals where they can receive help to focus on their best health. Whether you're a Humana member or not, the neighborhood center offers a... Main Serviceshealth education  exercise & fitness  community support services  recreation  virtual support Other Servicessupport groups Servinganyone in need adults young adults teens seniors individuals families [email or web address] to get more info  Schedule on their website      For more resources go online to RhodeIslandBargains.co.uk and type in Occupational psychologist  Agency Name: [facility] Agency Address: [address], [place], [place] [address] Phone: [phone] Website: www.alamanceservices.org Service(s) Offered: Housing services, self-sufficiency, congregate meal program, weatherization program, Event organiser program, emergency food assistance,  housing counseling, home ownership program, wheels - to work program.  Dole Food free for 60 and older at various locations from [employer], Monday-Friday:  [employer], [address] [place], [phone] -[facility], [address], [place] [phone]  -[facility], [address], [place] [phone]  -[address], [address] [place], [phone]  Agency Name: [facility] Address: [id number] [address], [place], [place] [address] Phone: [phone] Website: www.alamancemow.org Service(s) Offered: Home delivered hot, frozen, and emergency  meals. Grocery assistance program which matches  volunteers one-on-one with seniors unable to grocery shop  for themselves. Must be 60 years and older; less than 20  hours of in-home aide service, limited or no driving ability;  live alone or with someone with a disability; live in  [place].  Agency Name:  [facility]) Address: [address] [place], [place] [address] Phone: [phone] Service(s) Offered: Food is served to shut-ins, homeless, elderly, and low income people in the community every Saturday (11:30 am-12:30 pm) and Sunday (12:30 pm-1:30pm). Volunteers also offer help and encouragement in seeking employment,  and spiritual guidance.  Agency Name: Department of Social Services Address: [address] [place], [place] [address] Phone: [phone] Service(s) Offered: Child support services; child welfare services; food stamps; Medicaid; work first family assistance; and aid with fuel,  rent, food and medicine.  Agency Name: Dietitian Address: [address], [place], [place] Phone: [phone] Website: www.dreamalign.com Services Offered: Monday 10:00am-12:00, 8:00pm-9:00pm, and Friday 10:00am-12:00.  Agency Name: [employer] [facility] [place] Address: [address], [place], [place] [address] Phone: [phone] Website: www.alliedchurches.org Service(s) Offered: Serves weekday meals, open from 11:30 am- 1:00 pm., and 6:30-7:30pm, Monday-Wednesday-Friday distributes food 3:30-6pm, Monday-Wednesday-Friday.  Agency Name: [facility] Address: [address] [place], [place] Phone: [phone] Website: www.[email or web address].org Services Offered: Distributes food the 4th Saturday of the month, starting at 8:00 am  Agency Name: [facility] Address: [phone] [address], [place], [place] [address] Phone: [phone] Website: http://hbc..net Service(s) Offered: Bread of life, weekly food pantry. Open Wednesdays from 10:00am-noon.  Agency Name: [facility] [employer] Address: [address] [place], [place], [place] Phone: [phone] Services Offered: Distributes food 9am-1pm, Monday-Thursday. Call for details.  Agency Name: [facility] Address: [address], [place], [place] [address] Phone: [phone] Website: firstbaptistburlington.com Service(s) Offered: Games developer. Call for assistance.  Agency Name: [facility] Address: [address] [place], [place] [address] Phone: [phone] Service Offered: Emergency Food Pantry. Call for  appointment.  Agency Name: [facility] Address: [address] [place], [place] [address] Phone: [phone] Website: [email or web address] Services Offered: Games developer. Call for details  Agency Name: [facility] Address: [address] [place], [place] Phone: [phone] Website: [email or web address] Service(s) Offered: Emergency Food Pantry. Call for details.  Agency Name: Holiday representative Address: [address] [place], [place] [address] Phone: [phone] or [phone] Website: [email or web address].[email or web address].[email or web address] Service(s) Offered: Distribute food 9am-11:30 am, Tuesday-Friday, and 1-3:30pm, Monday-Friday. Food pantry Monday-Friday 1pm-3pm, fresh items, Mon.-Wed.-Fri.  Agency Name: [facility] Address: [address] [place], [place] [address] Phone: [phone] Website: www.safealamance.org Services Offered: Distribute food Tues and Sats from 9:00am-noon. Closed 1st Saturday of each month. Call for details  Agency Name: [employer] [employer] Address: Reynaldo Min[address] [facility] [address] [place] [address] Phone: [phone]  Services Offered: Delivers meals every Thursday

## 2023-08-12 NOTE — Plan of Care (Signed)

## 2023-08-12 NOTE — TOC Initial Note (Signed)
 Transition of Care [facility]) - Initial/Assessment Note    Patient Details  Name: Lee Bridges MRN: [id number] Date of Birth: [birth year]  [facility] CM/SW Contact:    Marlowe Sax, RN Phone Number: 08/12/2023, 1:31 PM  Clinical Narrative:                 Added Food, utilities, housing, transportation and social isolation resources for [employer], Also added Substance Use resources        Patient Goals and CMS Choice            Expected Discharge Plan and Services                                              Prior Living Arrangements/Services                       Activities of Daily Living   ADL Screening (condition at time of admission) Independently performs ADLs?: Yes (appropriate for developmental age) Is the patient deaf or have difficulty hearing?: No Does the patient have difficulty seeing, even when wearing glasses/contacts?: No Does the patient have difficulty concentrating, remembering, or making decisions?: No  Permission Sought/Granted                  Emotional Assessment              Admission diagnosis:  Neurosyphilis [A52.3] Patient Active Problem List   Diagnosis Date Noted   Polysubstance abuse (HCC) 08/11/2023   Suicidal ideations 08/09/2023   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   Marijuana use 08/09/2023   AIDS (acquired immune deficiency syndrome) (HCC) 07/29/2023   Oral candidiasis 07/29/2023   Neurosyphilis 07/29/2023   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 [place], [place] - [address] [place] [address] Phone: [phone] Fax: [phone]  [facility] [address] ([address]), [place] - [address] [place] (SE) [place] [address] Phone: [phone] Fax: [phone]  [id number] [place], [place] - [address] AT [facility] OF [facility] [address] [place] [address] Phone: [phone] Fax: [phone]  [facility] [address] [place] [address] Phone: [phone] Fax: [phone]     Social Drivers of Health (SDOH) Social History: SDOH Screenings   Food Insecurity: Food Insecurity Present (08/11/2023)  Housing: High Risk (08/11/2023)  Transportation Needs: Unmet Transportation Needs (08/11/2023)  Utilities: At Risk (08/11/2023)  Alcohol Screen: Low Risk  (08/08/2023)  Depression (PHQ2-9): Medium Risk (03/29/2023)  Social Connections: Socially Isolated (08/09/2023)  Tobacco Use: High Risk (08/09/2023)   SDOH Interventions: Food Insecurity Interventions: Inpatient TOC (added resources) Housing Interventions: Inpatient TOC (added resources) Transportation Interventions: Inpatient TOC (added resources) Utilities Interventions: Inpatient TOC (added resources) Social Connections Interventions: Inpatient TOC (  added resources)   Readmission Risk Interventions    08/03/2023   11:22 AM  Readmission Risk Prevention Plan  Transportation Screening Complete  Home Care Screening Complete  Medication Review (RN CM) Complete

## 2023-08-12 NOTE — Progress Notes (Signed)
 [facility] Progress Note  Patient: Lee Bridges    UXL:[id number]  DOA: 08/11/2023     Date of Service: the patient was seen and examined on 08/12/2023  No chief complaint on file.  Brief hospital course: Lee Bridges is a 33 y.o. 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  TRH consulted for admission for treatment of neurosyphilis.   Lee Bridges states that he is not experiencing any headache or neck pain at this time.  He was experiencing some left arm numbness and tingling when he had the PICC line and but this is improving since it has been removed.  He also endorses buttock pain with ambulation after receiving an intramuscular penicillin injection yesterday.  He otherwise denies any difficulty with ambulation, dizziness, vision changes, chest pain, shortness of breath, palpitations, nausea, vomiting, diarrhea, abdominal pain.  He is motivated to complete treatment so he can eventually go on to outpatient drug rehabilitation.   Assessment and Plan:  # Neurosyphilis   # Neurosyphilis  Patient was recently admitted and diagnosed with neurosyphilis on 07/28/2023 and discharged on 3/5 with plans for continued treatment at home via PICC line.  Unfortunately, patient's PICC line malfunctioned and he has not been receiving treatment.  Infectious disease was consulted at [facility] and recommended readmission to medical floor for treatment. 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  CD4 count has been extremely low for > 1 year, last 171  approximately 2 weeks ago.  - Continue Biktarvy - Continue prophylactic Bactrim   # Polysubstance Abuse History of polysubstance abuse including cocaine as his primary drug.  He is very motivated to quit but has been having trouble finding a outpatient rehabilitation center given he is on the registry. - Daily folic acid, thiamine and multivitamin   # Depression  - Psychiatry will continue to follow while pt is on the medical unit - Continue Zoloft and Seroquel 3/14 as per psych pt does not have SI?HI so no need of 1:1 monitoring   There is no height or weight on file to calculate BMI.  Interventions:  Diet: Regular DVT Prophylaxis: Subcutaneous Lovenox   Advance goals of care discussion: Full code  Family Communication: family was not present at bedside, at the time of interview.  The pt provided permission to discuss medical plan with the family. Opportunity was given to ask question and all questions were answered satisfactorily.   Disposition:  Pt is from [facility], admitted with Neurosyphilis for IV abx for 10 days till 3/24, which precludes a safe discharge. Discharge to BHU vs home, when cleared by ID, will be on Abx till 3/23 and dc plan on 3/24  Subjective: No significant events overnight, sleeping comfortably, denies headache or dizziness, no pain, or SOB  Physical Exam: General: NAD, lying comfortably Appear in no distress, affect appropriate Eyes: PERRLA ENT: Oral Mucosa Clear, moist  Neck: no JVD,  Cardiovascular: S1 and S2 Present, no Murmur,  Respiratory: good respiratory effort, Bilateral Air entry equal and Decreased, no Crackles, no wheezes Abdomen: Bowel Sound present,  Soft and no tenderness,  Skin: no rashes Extremities: no Pedal edema, no calf tenderness Neurologic: without any new focal findings Gait not checked due to patient safety concerns  Vitals:   08/12/23 0301  BP: 105/75  Pulse: 84  Resp: 18  Temp: 97.9 F (36.6 C)  SpO2: 100%   No intake  or output data in the 24 hours ending 08/12/23 0847 There were no vitals filed for this visit.  Data Reviewed: I have personally reviewed and interpreted daily labs, tele strips, imagings as discussed above. I reviewed all nursing notes, pharmacy notes, vitals, pertinent old records I have discussed plan of care as described above with RN and patient/family.  CBC: Recent Labs  Lab 08/05/23 1152 08/08/23 2043 08/12/23 0510  WBC 4.0 4.4 4.6  NEUTROABS  --  1.5*  --   HGB 12.0* 13.1 10.4*  HCT 35.3* 39.1 30.3*  MCV 100.6* 101.3* 101.3*  PLT 254 324 265   Basic Metabolic Panel: Recent Labs  Lab 08/05/23 1152 08/08/23 2043 08/10/23 1111 08/12/23 0510  NA 135 137 136 137  K 4.2 4.1 4.0 4.0  CL 104 102 105 106  CO2 22 26 21* 23  GLUCOSE 93 95 94 88  BUN 18 14 18 19   CREATININE 1.10 1.30* 1.16 1.06  CALCIUM 9.2 9.5 9.2 9.0    Studies: No results found.  Scheduled Meds:  bictegravir-emtricitabine-tenofovir AF  1 tablet Oral Daily   enoxaparin (LOVENOX) injection  40 mg Subcutaneous Q24H   folic acid  1 mg Oral Daily   multivitamin with minerals  1 tablet Oral Daily   QUEtiapine  100 mg Oral QHS   sertraline  50 mg Oral Daily   sodium chloride flush  3 mL Intravenous Q12H   sulfamethoxazole-trimethoprim  1 tablet Oral Daily   thiamine  100 mg Oral Daily   Continuous Infusions:  penicillin G potassium 12 Million Units in dextrose 5 % 500 mL CONTINUOUS infusion 41.7 mL/hr at 08/12/23 0020   PRN Meds: acetaminophen **OR** acetaminophen, methocarbamol, ondansetron (ZOFRAN) IV, polyethylene glycol  Time spent: 35 minutes  Author: Gillis Santa. MD [facility] 08/12/2023 8:47 AM  To reach On-call, see care teams to locate the attending and reach out to them via [email or web address]. If 7PM-7AM, please contact night-coverage If you still have difficulty reaching the attending provider, please page the [facility] (Director on Call) for [facility] on amion for  assistance.

## 2023-08-12 NOTE — Consult Note (Signed)
 [facility] Psychiatric Consult Initial  Patient Name: .Lee Bridges  MRN: [id number]  DOB: Jul 17, 1990  Consult Order details:  Orders (From admission, onward)     Start     Ordered   08/12/23 0851  IP CONSULT TO PSYCHIATRY       Ordering Provider: Gillis Santa, MD  Provider:  (Not yet assigned)  Question Answer Comment  Location [facility]   Reason for Consult? Depression and suicidal ideations      08/12/23 0850             Mode of Visit: In person    Psychiatry Consult Evaluation  Service Date: August 12, 2023 LOS:  LOS: 1 day  Chief Complaint Suicidal thoughts  Primary Psychiatric Diagnoses  MDD Moderate 2.   3.    Assessment  Lee Bridges is a 33 y.o. male  never married African-American male with a self-reported history of " schizophrenia, bipolar with psychotic features", polysubstance abuse (methamphetamine, cocaine, marijuana), medical history of AIDS, neurosyphilis, noncompliance with medications, presented to [facility] on 3/10 with chief complaints of suicidal ideation without plan and displayed aggressive behaviors.  Patient was admitted to the [facility] regional psychiatric inpatient unit.  During his stay he followed the treatment plan.  Medical consult had to be placed as he needed treatment for the neurosyphilis through PICC line which was malfunctioning and so had to be transferred to the medical floor for Infusions.    Diagnoses:  Active Hospital problems: Principal Problem:   Neurosyphilis Active Problems:   HIV (human immunodeficiency virus infection) (HCC)   MDD (major depressive disorder), recurrent episode, moderate (HCC)   AIDS (acquired immune deficiency syndrome) (HCC)   Polysubstance abuse (HCC)    Plan   ## Psychiatric Medication Recommendations:   Continue Seroquel 100 mg HS for mood stabilization/sleep/depressive sxs/psychosis -- Continue Zoloft 50 mg every day for depressive sxs --  Continue melatonin 5 mg HS for sleep aid  -- Continue Lorazepam 2 mg q6h prn for agitation/anxiety   ## Medical Decision Making Capacity: Not specifically addressed in this encounter  ## Further Work-up:   -- most recent EKG on 07/31/23 had QtC of 469  ## Disposition:-- will be determined  ## Behavioral / Environmental: -Utilize compassion and acknowledge the patient's experiences while setting clear and realistic expectations for care.    ## Safety and Observation Level:  - Based on my clinical evaluation, I estimate the patient to be at NO risk of self harm in the current setting. - At this time, we recommend  routine. This decision is based on my review of the chart including patient's history and current presentation, interview of the patient, mental status examination, and consideration of suicide risk including evaluating suicidal ideation, plan, intent, suicidal or self-harm behaviors, risk factors, and protective factors. This judgment is based on our ability to directly address suicide risk, implement suicide prevention strategies, and develop a safety plan while the patient is in the clinical setting. Please contact our team if there is a concern that risk level has changed.  CSSR Risk Category:C-SSRS RISK CATEGORY: Error: Q3, 4, or 5 should not be populated when Q2 is No  Suicide Risk Assessment: Patient has following modifiable risk factors for suicide: lack of access to outpatient mental health resources, which we are addressing by providing outpatient MH resources. Patient has following non-modifiable or demographic risk factors for suicide: male gender and psychiatric hospitalization Patient has the following protective factors against suicide: Cultural,  spiritual, or religious beliefs that discourage suicide and Frustration tolerance  Thank you for this consult request. Recommendations have been communicated to the primary team.  We will follow up as needed basis at this time.    Verner Chol, MD       History of Present Illness  Lee Bridges is a 33 y.o. male  never married African-American male with a self-reported history of " schizophrenia, bipolar with psychotic features", polysubstance abuse (methamphetamine, cocaine, marijuana), medical history of AIDS, neurosyphilis, noncompliance with medications, presented to [facility] on 3/10 with chief complaints of suicidal ideation without plan and displayed aggressive behaviors.  Patient was admitted to the [facility] regional psychiatric inpatient unit.  During his stay he followed the treatment plan.  Medical consult had to be placed as he needed treatment for the neurosyphilis through PICC line which was malfunctioning and so had to be transferred to the medical floor for Infusions. Psych ROS:  Depression: denies feeling hopeless/worthless, denies SI/HI/plan/intent, denies auditory/visual hallucinations, has fair appetite and sleep Anxiety:  denies anxiety and panic attacks Mania (lifetime and current): not displaying any grandiose delusions Psychosis: (lifetime and current): denies auditory/visual hallcuinations    Psychiatric and Social History  Psychiatric History:  Information collected from Patient  Prev Dx/Sx: schizophrenia Current Psych Provider: none Home Meds (current): none Previous Med Trials: unable to recall Therapy: none  Prior Psych Hospitalization: Behavioral Health in [place] in 2001, kept x7 days for attempting to hang himself  Prior Self Harm: as above Prior Violence: denies  Family Psych History: Psych dx: maternal cousin - schizophrenia, father - schizophrenia Suicide: maternal cousin, hung self in 2003 Substance use: mother - EtOH, cocaine; father - EtOH  Social History:  Developmental Hx: normal Educational Hx: HS 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  Legal Hx: Legal Consequences:  kidnapping and rape charges  in 2012, incarcerated x3 yrs  Living Situation: homelessNever married and no children.  Spiritual Hx: Pentecostal belief.  Access to weapons/lethal means: denies   Substance History Alcohol: denies  methamphetamine, cocaine and marijuana. Has been using methamphetamine for the last 1.5 years, uses this every 2 to 4 months, via smoking, last use was 3 days ago. Has been using cocaine since 33 years old, daily via smoking, 4 g a day, last used 3 days ago. Reports that this is his drug of choice. Began using marijuana at 33 years old, about a gram once weekly, last used 3 days ago. Reports history of court ordered substance use treatment at task at which point he was able to maintain sobriety for a period of 4 months, states this was about 8 months ago.   Exam Findings  Physical Exam: Reviewed and agree with the physical exam findings conducted by the medical exam provider Vital Signs:  Temp:  [97.7 F (36.5 C)-98.2 F (36.8 C)] 98.1 F (36.7 C) (03/14 1204) Pulse Rate:  [84-108] 108 (03/14 1204) Resp:  [16-18] 18 (03/14 1204) BP: (105-122)/(65-81) 122/65 (03/14 1204) SpO2:  [99 %-100 %] 99 % (03/14 1204) Blood pressure 122/65, pulse (!) 108, temperature 98.1 F (36.7 C), resp. rate 18, SpO2 99%. There is no height or weight on file to calculate BMI.    Mental Status Exam: General Appearance: Casual and Fairly Groomed  Orientation:  Full (Time, Place, and Person)  Memory:  Immediate;   Fair Recent;   Fair Remote;   Fair  Concentration:  Concentration: Fair and Attention Span: Fair  Recall:  Fair  Attention  Fair  Eye Contact:  Fair  Speech:  Clear and Coherent  Language:  Fair  Volume:  Normal  Mood: fine  Affect:  Appropriate  Thought Process:  Coherent  Thought Content:  Logical  Suicidal Thoughts:  No  Homicidal Thoughts:  No  Judgement:  Fair  Insight:  Fair  Psychomotor Activity:  Normal  Akathisia:  No  Fund of Knowledge:  Fair      Assets:  Communication Skills   Cognition:  WNL  ADL's:  Intact  AIMS (if indicated):        Other History   These have been pulled in through the EMR, reviewed, and updated if appropriate.  Family History:  The patient's family history includes Arthritis in his mother; Diabetes in his father; Hypertension in his father.  Medical History: Past Medical History:  Diagnosis Date   Depression    HIV (human immunodeficiency virus infection) (HCC)    Psychosis (HCC)    Substance abuse (HCC)     Surgical History: No past surgical history on file.   Medications:   Current Facility-Administered Medications:    acetaminophen (TYLENOL) tablet 650 mg, 650 mg, Oral, Q6H PRN **OR** acetaminophen (TYLENOL) suppository 650 mg, 650 mg, Rectal, Q6H PRN, Verdene Lennert, MD   bictegravir-emtricitabine-tenofovir AF (BIKTARVY) 50-200-25 MG per tablet 1 tablet, 1 tablet, Oral, Daily, Verdene Lennert, MD, 1 tablet at 08/12/23 1005   enoxaparin (LOVENOX) injection 40 mg, 40 mg, Subcutaneous, Q24H, Verdene Lennert, MD, 40 mg at 08/12/23 0011   folic acid (FOLVITE) tablet 1 mg, 1 mg, Oral, Daily, Verdene Lennert, MD, 1 mg at 08/12/23 1005   methocarbamol (ROBAXIN) tablet 500 mg, 500 mg, Oral, Q6H PRN, Verdene Lennert, MD, 500 mg at 08/12/23 1005   [START ON 08/13/2023] multivitamin with minerals tablet 1 tablet, 1 tablet, Oral, QHS, Zeigler, Dustin G, RPH   ondansetron (ZOFRAN) injection 4 mg, 4 mg, Intravenous, Q6H PRN, Verdene Lennert, MD   penicillin G potassium 12 Million Units in dextrose 5 % 500 mL CONTINUOUS infusion, 12 Million Units, Intravenous, Q12H, Verdene Lennert, MD, Last Rate: 41.7 mL/hr at 08/12/23 1138, 12 Million Units at 08/12/23 1138   polyethylene glycol (MIRALAX / GLYCOLAX) packet 17 g, 17 g, Oral, Daily PRN, Verdene Lennert, MD   QUEtiapine (SEROQUEL) tablet 100 mg, 100 mg, Oral, QHS, Verdene Lennert, MD   sertraline (ZOLOFT) tablet 50 mg, 50 mg, Oral, Daily, Verdene Lennert, MD, 50 mg at 08/12/23 1005   sodium  chloride flush (NS) 0.9 % injection 3 mL, 3 mL, Intravenous, Q12H, Verdene Lennert, MD, 3 mL at 08/12/23 0011   sulfamethoxazole-trimethoprim (BACTRIM) 400-80 MG per tablet 1 tablet, 1 tablet, Oral, Daily, Verdene Lennert, MD, 1 tablet at 08/12/23 1005   thiamine (VITAMIN B1) tablet 100 mg, 100 mg, Oral, Daily, Verdene Lennert, MD, 100 mg at 08/12/23 1005  Allergies: Allergies  Allergen Reactions   Fish Allergy Hives and Nausea And Vomiting   Shellfish Allergy Hives and Nausea And Vomiting    Verner Chol, MD

## 2023-08-13 DIAGNOSIS — A523 Neurosyphilis, unspecified: Secondary | ICD-10-CM | POA: Diagnosis not present

## 2023-08-13 LAB — BASIC METABOLIC PANEL
Anion gap: 4 — ABNORMAL LOW (ref 5–15)
BUN: 16 mg/dL (ref 6–20)
CO2: 24 mmol/L (ref 22–32)
Calcium: 9.2 mg/dL (ref 8.9–10.3)
Chloride: 108 mmol/L (ref 98–111)
Creatinine, Ser: 1.08 mg/dL (ref 0.61–1.24)
GFR, Estimated: >60 mL/min (ref >60)
Glucose, Bld: 105 mg/dL — ABNORMAL HIGH (ref 70–99)
Potassium: 4.3 mmol/L (ref 3.5–5.1)
Sodium: 136 mmol/L (ref 135–145)

## 2023-08-13 LAB — CBC WITH DIFFERENTIAL/PLATELET
Abs Immature Granulocytes: 0.18 10*3/uL — ABNORMAL HIGH (ref 0.00–0.07)
Basophils Absolute: 0 10*3/uL (ref 0.0–0.1)
Basophils Relative: 1 %
Eosinophils Absolute: 0.3 10*3/uL (ref 0.0–0.5)
Eosinophils Relative: 7 %
HCT: 30.5 % — ABNORMAL LOW (ref 39.0–52.0)
Hemoglobin: 10.4 g/dL — ABNORMAL LOW (ref 13.0–17.0)
Immature Granulocytes: 4 %
Lymphocytes Relative: 35 %
Lymphs Abs: 1.7 10*3/uL (ref 0.7–4.0)
MCH: 34.1 pg — ABNORMAL HIGH (ref 26.0–34.0)
MCHC: 34.1 g/dL (ref 30.0–36.0)
MCV: 100 fL (ref 80.0–100.0)
Monocytes Absolute: 0.7 10*3/uL (ref 0.1–1.0)
Monocytes Relative: 15 %
Neutro Abs: 1.9 10*3/uL (ref 1.7–7.7)
Neutrophils Relative %: 38 %
Platelets: 258 10*3/uL (ref 150–400)
RBC: 3.05 MIL/uL — ABNORMAL LOW (ref 4.22–5.81)
RDW: 11.8 % (ref 11.5–15.5)
WBC: 4.8 10*3/uL (ref 4.0–10.5)
nRBC: 0 % (ref 0.0–0.2)

## 2023-08-13 LAB — PHOSPHORUS: Phosphorus: 2.9 mg/dL (ref 2.5–4.6)

## 2023-08-13 LAB — MAGNESIUM: Magnesium: 1.8 mg/dL (ref 1.7–2.4)

## 2023-08-13 MED ORDER — VITAMIN B-12 1000 MCG PO TABS
1000.0000 ug | ORAL_TABLET | Freq: Every day | ORAL | Status: DC
  Administered 2023-08-13 – 2023-08-22 (×10): 1000 ug via ORAL
  Filled 2023-08-13 (×10): qty 1

## 2023-08-13 NOTE — Progress Notes (Signed)
 [facility] Progress Note  Patient: Lee Bridges    IRJ:[id number]  DOA: 08/11/2023     Date of Service: the patient was seen and examined on 08/13/2023  No chief complaint on file.  Brief hospital course: Lee Bridges is a 33 y.o. 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  TRH consulted for admission for treatment of neurosyphilis.   Mr. Ruesch states that he is not experiencing any headache or neck pain at this time.  He was experiencing some left arm numbness and tingling when he had the PICC line and but this is improving since it has been removed.  He also endorses buttock pain with ambulation after receiving an intramuscular penicillin injection yesterday.  He otherwise denies any difficulty with ambulation, dizziness, vision changes, chest pain, shortness of breath, palpitations, nausea, vomiting, diarrhea, abdominal pain.  He is motivated to complete treatment so he can eventually go on to outpatient drug rehabilitation.   Assessment and Plan:  # Neurosyphilis  Patient was recently admitted and diagnosed with neurosyphilis on 07/28/2023 and discharged on 3/5 with plans for continued treatment at home via PICC line.  Unfortunately, patient's PICC line malfunctioned and he has not been receiving treatment.  Infectious disease was consulted at [facility] and recommended readmission to medical floor for treatment. 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  CD4 count has been extremely low for > 1 year, last 171 approximately 2 weeks  ago.  - Continue Biktarvy - Continue prophylactic Bactrim   # Polysubstance Abuse History of polysubstance abuse including cocaine as his primary drug.  He is very motivated to quit but has been having trouble finding a outpatient rehabilitation center given he is on the registry. - Daily folic acid, thiamine and multivitamin   # Depression  - Psychiatry will continue to follow while pt is on the medical unit - Continue Zoloft and Seroquel 3/14 as per psych pt does not have SI/HI, so no need of 1:1 monitoring   # Vitamin B12 level 251, goal >400, started on B12 oral supplement   There is no height or weight on file to calculate BMI.  Interventions:  Diet: Regular DVT Prophylaxis: Subcutaneous Lovenox   Advance goals of care discussion: Full code  Family Communication: family was not present at bedside, at the time of interview.  The pt provided permission to discuss medical plan with the family. Opportunity was given to ask question and all questions were answered satisfactorily.   Disposition:  Pt is from [facility], admitted with Neurosyphilis for IV abx for 10 days till 3/24, which precludes a safe discharge. Discharge to BHU vs home, when cleared by ID, will be on Abx till 3/23 and dc plan on 3/24  Subjective: No significant events overnight, sleeping comfortably, denies headache or dizziness, no pain, or SOB  Physical Exam: General: NAD, lying comfortably Appear in no distress, affect appropriate Eyes: PERRLA ENT: Oral Mucosa Clear, moist  Neck: no JVD,  Cardiovascular: S1 and S2 Present, no Murmur,  Respiratory: good respiratory effort, Bilateral Air entry equal and  Decreased, no Crackles, no wheezes Abdomen: Bowel Sound present, Soft and no tenderness,  Skin: no rashes Extremities: no Pedal edema, no calf tenderness Neurologic: without any new focal findings Gait not checked due to patient safety concerns  Vitals:   08/12/23 1544 08/12/23 2042 08/13/23 0335 08/13/23  0830  BP: 133/70 116/78 (!) 112/57 116/82  Pulse: 77 94 91 75  Resp: 18 18 16 16   Temp: 98 F (36.7 C) 97.9 F (36.6 C) (!) 97.5 F (36.4 C) 98.2 F (36.8 C)  TempSrc:   Oral   SpO2: 98% 100% 100% 100%    Intake/Output Summary (Last 24 hours) at 08/13/2023 1333 Last data filed at 08/13/2023 1100 Gross per 24 hour  Intake 1936.9 ml  Output --  Net 1936.9 ml   There were no vitals filed for this visit.  Data Reviewed: I have personally reviewed and interpreted daily labs, tele strips, imagings as discussed above. I reviewed all nursing notes, pharmacy notes, vitals, pertinent old records I have discussed plan of care as described above with RN and patient/family.  CBC: Recent Labs  Lab 08/08/23 2043 08/12/23 0510 08/13/23 0310  WBC 4.4 4.6 4.8  NEUTROABS 1.5*  --  1.9  HGB 13.1 10.4* 10.4*  HCT 39.1 30.3* 30.5*  MCV 101.3* 101.3* 100.0  PLT 324 265 258   Basic Metabolic Panel: Recent Labs  Lab 08/08/23 2043 08/10/23 1111 08/12/23 0509 08/12/23 0510 08/13/23 0310  NA 137 136  --  137 136  K 4.1 4.0  --  4.0 4.3  CL 102 105  --  106 108  CO2 26 21*  --  23 24  GLUCOSE 95 94  --  88 105*  BUN 14 18  --  19 16  CREATININE 1.30* 1.16  --  1.06 1.08  CALCIUM 9.5 9.2  --  9.0 9.2  MG  --   --  1.7  --  1.8  PHOS  --   --  3.8  --  2.9    Studies: No results found.  Scheduled Meds:  bictegravir-emtricitabine-tenofovir AF  1 tablet Oral Daily   vitamin B-12  1,000 mcg Oral Daily   enoxaparin (LOVENOX) injection  40 mg Subcutaneous Q24H   folic acid  1 mg Oral Daily   melatonin  5 mg Oral QHS   multivitamin with minerals  1 tablet Oral QHS   QUEtiapine  100 mg Oral QHS   sertraline  50 mg Oral Daily   sodium chloride flush  3 mL Intravenous Q12H   sulfamethoxazole-trimethoprim  1 tablet Oral Daily   thiamine  100 mg Oral Daily   Continuous Infusions:  penicillin G potassium 12 Million Units in dextrose 5 % 500 mL CONTINUOUS infusion 12 Million Units  (08/13/23 1053)   PRN Meds: acetaminophen **OR** acetaminophen, LORazepam, methocarbamol, nicotine polacrilex, OLANZapine zydis, ondansetron (ZOFRAN) IV, polyethylene glycol  Time spent: 35 minutes  Author: Gillis Santa. MD [facility] 08/13/2023 1:33 PM  To reach On-call, see care teams to locate the attending and reach out to them via [email or web address]. If 7PM-7AM, please contact night-coverage If you still have difficulty reaching the attending provider, please page the [facility] (Director on Call) for [facility] on amion for assistance.

## 2023-08-13 NOTE — Plan of Care (Signed)
  Problem: Clinical Measurements: Goal: Diagnostic test results will improve Outcome: Progressing   Problem: Nutrition: Goal: Adequate nutrition will be maintained Outcome: Progressing   Problem: Pain Managment: Goal: General experience of comfort will improve and/or be controlled Outcome: Progressing

## 2023-08-13 NOTE — Progress Notes (Signed)
 Patient informed Clinical research associate that his belongs (cell phone, wallet, medications, and some clothing) are still in the BHU.  Nurse called to the [facility] and it was confirmed that his belongs were still there.  Nurse walked down to the [employer] - patient's personal items were handed to the nurse.  Nurse handed the patient his cell phone, charger, wallet and clothing. Patient opted to keep those belongs at the bedside. Lighter placed in locked med room.  Medications sent to pharmacy with security bag [phone].

## 2023-08-14 DIAGNOSIS — A523 Neurosyphilis, unspecified: Secondary | ICD-10-CM | POA: Diagnosis not present

## 2023-08-14 DIAGNOSIS — B20 Human immunodeficiency virus [HIV] disease: Secondary | ICD-10-CM | POA: Diagnosis not present

## 2023-08-14 LAB — CBC WITH DIFFERENTIAL/PLATELET
Abs Immature Granulocytes: 0.35 10*3/uL — ABNORMAL HIGH (ref 0.00–0.07)
Basophils Absolute: 0.1 10*3/uL (ref 0.0–0.1)
Basophils Relative: 1 %
Eosinophils Absolute: 0.4 10*3/uL (ref 0.0–0.5)
Eosinophils Relative: 6 %
HCT: 32.9 % — ABNORMAL LOW (ref 39.0–52.0)
Hemoglobin: 11 g/dL — ABNORMAL LOW (ref 13.0–17.0)
Immature Granulocytes: 6 %
Lymphocytes Relative: 29 %
Lymphs Abs: 1.8 10*3/uL (ref 0.7–4.0)
MCH: 34.2 pg — ABNORMAL HIGH (ref 26.0–34.0)
MCHC: 33.4 g/dL (ref 30.0–36.0)
MCV: 102.2 fL — ABNORMAL HIGH (ref 80.0–100.0)
Monocytes Absolute: 0.7 10*3/uL (ref 0.1–1.0)
Monocytes Relative: 11 %
Neutro Abs: 2.8 10*3/uL (ref 1.7–7.7)
Neutrophils Relative %: 47 %
Platelets: 264 10*3/uL (ref 150–400)
RBC: 3.22 MIL/uL — ABNORMAL LOW (ref 4.22–5.81)
RDW: 11.9 % (ref 11.5–15.5)
Smear Review: NORMAL
WBC: 6 10*3/uL (ref 4.0–10.5)
nRBC: 0 % (ref 0.0–0.2)

## 2023-08-14 LAB — BASIC METABOLIC PANEL
Anion gap: 5 (ref 5–15)
BUN: 18 mg/dL (ref 6–20)
CO2: 23 mmol/L (ref 22–32)
Calcium: 9 mg/dL (ref 8.9–10.3)
Chloride: 106 mmol/L (ref 98–111)
Creatinine, Ser: 1.23 mg/dL (ref 0.61–1.24)
GFR, Estimated: >60 mL/min (ref >60)
Glucose, Bld: 96 mg/dL (ref 70–99)
Potassium: 4.3 mmol/L (ref 3.5–5.1)
Sodium: 134 mmol/L — ABNORMAL LOW (ref 135–145)

## 2023-08-14 LAB — PHOSPHORUS: Phosphorus: 2.5 mg/dL (ref 2.5–4.6)

## 2023-08-14 LAB — MAGNESIUM: Magnesium: 1.6 mg/dL — ABNORMAL LOW (ref 1.7–2.4)

## 2023-08-14 MED ORDER — ACETAMINOPHEN 650 MG RE SUPP: 650.0000 mg | Freq: Four times a day (QID) | RECTAL | Status: DC | PRN

## 2023-08-14 MED ORDER — MAGNESIUM SULFATE 4 GM/100ML IV SOLN
4.0000 g | Freq: Once | INTRAVENOUS | Status: AC
  Administered 2023-08-14: 4 g via INTRAVENOUS
  Filled 2023-08-14: qty 100

## 2023-08-14 MED ORDER — ACETAMINOPHEN 325 MG PO TABS
650.0000 mg | ORAL_TABLET | Freq: Four times a day (QID) | ORAL | Status: DC | PRN
  Administered 2023-08-16 – 2023-08-17 (×2): 650 mg via ORAL
  Filled 2023-08-14 (×3): qty 2

## 2023-08-14 NOTE — Progress Notes (Signed)
 [facility] Progress Note  Patient: Lee Bridges    AVW:[id number]  DOA: 08/11/2023     Date of Service: the patient was seen and examined on 08/14/2023  No chief complaint on file.  Brief hospital course: Lee Bridges is a 33 y.o. 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  [facility] consulted for admission for treatment of neurosyphilis.   Lee Bridges states that he is not experiencing any headache or neck pain at this time.  He was experiencing some left arm numbness and tingling when he had the PICC line and but this is improving since it has been removed.  He also endorses buttock pain with ambulation after receiving an intramuscular penicillin injection yesterday.  He otherwise denies any difficulty with ambulation, dizziness, vision changes, chest pain, shortness of breath, palpitations, nausea, vomiting, diarrhea, abdominal pain.  He is motivated to complete treatment so he can eventually go on to outpatient drug rehabilitation.   Assessment and Plan:  # Neurosyphilis  Patient was recently admitted and diagnosed with neurosyphilis on 07/28/2023 and discharged on 3/5 with plans for continued treatment at home via PICC line.  Unfortunately, patient's PICC line malfunctioned and he has not been receiving treatment.  Infectious disease was consulted at [facility] and recommended readmission to medical floor for treatment. 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  CD4 count has been extremely low for > 1 year, last 171 approximately 2 weeks  ago.  - Continue Biktarvy - Continue prophylactic Bactrim   # Polysubstance Abuse History of polysubstance abuse including cocaine as his primary drug.  He is very motivated to quit but has been having trouble finding a outpatient rehabilitation center given he is on the registry. - Daily folic acid, thiamine and multivitamin   # Depression  - Psychiatry will continue to follow while pt is on the medical unit - Continue Zoloft and Seroquel 3/14 as per psych pt does not have SI/HI, so no need of 1:1 monitoring   # Vitamin B12 level 251, goal >400, started on B12 oral supplement   There is no height or weight on file to calculate BMI.  Interventions:  Diet: Regular DVT Prophylaxis: Subcutaneous Lovenox   Advance goals of care discussion: Full code  Family Communication: family was not present at bedside, at the time of interview.  The pt provided permission to discuss medical plan with the family. Opportunity was given to ask question and all questions were answered satisfactorily.   Disposition:  Pt is from [facility], admitted with Neurosyphilis for IV abx for 10 days till 3/24, which precludes a safe discharge. Discharge to BHU vs home, when cleared by ID, will be on Abx till 3/23 and dc plan on 3/24  Subjective: No significant events overnight, resting comfortably in the bed, denies headache in am but had headache in the afternoon, denied any other complaints    Physical Exam: General: NAD, lying comfortably Appear in no distress, affect appropriate Eyes: PERRLA ENT: Oral Mucosa Clear, moist  Neck: no JVD,  Cardiovascular: S1 and S2 Present, no  Murmur,  Respiratory: good respiratory effort, Bilateral Air entry equal and Decreased, no Crackles, no wheezes Abdomen: Bowel Sound present, Soft and no tenderness,  Skin: no rashes Extremities: no Pedal edema, no calf tenderness Neurologic: without any new focal findings Gait not checked due to patient safety concerns  Vitals:    08/13/23 1427 08/13/23 2010 08/14/23 0349 08/14/23 0818  BP: 124/69 117/75 106/67 114/72  Pulse: 92 82 83 72  Resp: 16 20 18 16   Temp: 97.7 F (36.5 C) 99 F (37.2 C) 98 F (36.7 C) 97.8 F (36.6 C)  TempSrc:   Oral   SpO2: 100% 100% 100% 100%    Intake/Output Summary (Last 24 hours) at 08/14/2023 1245 Last data filed at 08/14/2023 0900 Gross per 24 hour  Intake 1512.3 ml  Output --  Net 1512.3 ml   There were no vitals filed for this visit.  Data Reviewed: I have personally reviewed and interpreted daily labs, tele strips, imagings as discussed above. I reviewed all nursing notes, pharmacy notes, vitals, pertinent old records I have discussed plan of care as described above with RN and patient/family.  CBC: Recent Labs  Lab 08/08/23 2043 08/12/23 0510 08/13/23 0310 08/14/23 0613  WBC 4.4 4.6 4.8 6.0  NEUTROABS 1.5*  --  1.9 2.8  HGB 13.1 10.4* 10.4* 11.0*  HCT 39.1 30.3* 30.5* 32.9*  MCV 101.3* 101.3* 100.0 102.2*  PLT 324 265 258 264   Basic Metabolic Panel: Recent Labs  Lab 08/08/23 2043 08/10/23 1111 08/12/23 0509 08/12/23 0510 08/13/23 0310 08/14/23 0613  NA 137 136  --  137 136 134*  K 4.1 4.0  --  4.0 4.3 4.3  CL 102 105  --  106 108 106  CO2 26 21*  --  23 24 23   GLUCOSE 95 94  --  88 105* 96  BUN 14 18  --  19 16 18   CREATININE 1.30* 1.16  --  1.06 1.08 1.23  CALCIUM 9.5 9.2  --  9.0 9.2 9.0  MG  --   --  1.7  --  1.8 1.6*  PHOS  --   --  3.8  --  2.9 2.5    Studies: No results found.  Scheduled Meds:  bictegravir-emtricitabine-tenofovir AF  1 tablet Oral Daily   vitamin B-12  1,000 mcg Oral Daily   enoxaparin (LOVENOX) injection  40 mg Subcutaneous Q24H   folic acid  1 mg Oral Daily   melatonin  5 mg Oral QHS   multivitamin with minerals  1 tablet Oral QHS   QUEtiapine  100 mg Oral QHS   sertraline  50 mg Oral Daily   sodium chloride flush  3 mL Intravenous Q12H   sulfamethoxazole-trimethoprim  1 tablet Oral Daily   thiamine  100 mg  Oral Daily   Continuous Infusions:  penicillin G potassium 12 Million Units in dextrose 5 % 500 mL CONTINUOUS infusion 12 Million Units (08/14/23 0032)   PRN Meds: acetaminophen **OR** acetaminophen, LORazepam, methocarbamol, nicotine polacrilex, OLANZapine zydis, ondansetron (ZOFRAN) IV, polyethylene glycol  Time spent: 35 minutes  Author: Gillis Santa. MD [facility] 08/14/2023 12:45 PM  To reach On-call, see care teams to locate the attending and reach out to them via [email or web address]. If 7PM-7AM, please contact night-coverage If you still have difficulty reaching the attending provider, please page the [facility] (Director on Call) for [facility] on amion for assistance.

## 2023-08-14 NOTE — Plan of Care (Signed)

## 2023-08-14 NOTE — Consult Note (Signed)
 [facility] Consult Follow up  Patient Name: .Lee Bridges  MRN: [id number]  DOB: January 29, 1991  Consult Order details:  Orders (From admission, onward)     Start     Ordered   08/12/23 0851  IP CONSULT TO PSYCHIATRY       Ordering Provider: Gillis Santa, MD  Provider:  (Not yet assigned)  Question Answer Comment  Location [facility]   Reason for Consult? Depression and suicidal ideations      08/12/23 0850             Mode of Visit: In person    Psychiatry Consult Evaluation  Service Date: August 14, 2023 LOS:  LOS: 3 days  Chief Complaint Suicidal thoughts  Primary Psychiatric Diagnoses  MDD Moderate 2.   3.    Assessment  Lee Bridges is a 34 y.o. male  never married African-American male with a self-reported history of " schizophrenia, bipolar with psychotic features", polysubstance abuse (methamphetamine, cocaine, marijuana), medical history of AIDS, neurosyphilis, noncompliance with medications, presented to [facility] on 3/10 with chief complaints of suicidal ideation without plan and displayed aggressive behaviors.  Patient was admitted to the [facility] regional psychiatric inpatient unit.  During his stay he followed the treatment plan.  Medical consult had to be placed as he needed treatment for the neurosyphilis through PICC line which was malfunctioning and so had to be transferred to the medical floor for Infusions.  Today on assessment patient remains psychiatrically stable, denies SI/HI/intent/plan, not responding to stimuli, denies AVH.  Will follow-up with him as needed basis    Diagnoses:  Active Hospital problems: Principal Problem:   Neurosyphilis Active Problems:   HIV (human immunodeficiency virus infection) (HCC)   MDD (major depressive disorder), recurrent episode, moderate (HCC)   AIDS (acquired immune deficiency syndrome) (HCC)   Polysubstance abuse (HCC)    Plan   ## Psychiatric  Medication Recommendations:   Continue Seroquel 100 mg HS for mood stabilization/sleep/depressive sxs/psychosis -- Continue Zoloft 50 mg every day for depressive sxs -- Continue melatonin 5 mg HS for sleep aid  -- Continue Lorazepam 2 mg q6h prn for agitation/anxiety   ## Medical Decision Making Capacity: Not specifically addressed in this encounter  ## Further Work-up:   -- most recent EKG on 07/31/23 had QtC of 469  ## Disposition:-- will be determined  ## Behavioral / Environmental: -Utilize compassion and acknowledge the patient's experiences while setting clear and realistic expectations for care.    ## Safety and Observation Level:  - Based on my clinical evaluation, I estimate the patient to be at NO risk of self harm in the current setting. - At this time, we recommend  routine. This decision is based on my review of the chart including patient's history and current presentation, interview of the patient, mental status examination, and consideration of suicide risk including evaluating suicidal ideation, plan, intent, suicidal or self-harm behaviors, risk factors, and protective factors. This judgment is based on our ability to directly address suicide risk, implement suicide prevention strategies, and develop a safety plan while the patient is in the clinical setting. Please contact our team if there is a concern that risk level has changed.  CSSR Risk Category:C-SSRS RISK CATEGORY: No Risk  Suicide Risk Assessment: Patient has following modifiable risk factors for suicide: lack of access to outpatient mental health resources, which we are addressing by providing outpatient MH resources. Patient has following non-modifiable or demographic risk factors for suicide:  male gender and psychiatric hospitalization Patient has the following protective factors against suicide: Cultural, spiritual, or religious beliefs that discourage suicide and Frustration tolerance  Thank you for this  consult request. Recommendations have been communicated to the primary team.  We will follow up as needed basis at this time.   Verner Chol, MD       History of Present Illness  Lee Bridges is a 33 y.o. male  never married African-American male with a self-reported history of " schizophrenia, bipolar with psychotic features", polysubstance abuse (methamphetamine, cocaine, marijuana), medical history of AIDS, neurosyphilis, noncompliance with medications, presented to [facility] on 3/10 with chief complaints of suicidal ideation without plan and displayed aggressive behaviors.  Patient was admitted to the [facility] regional psychiatric inpatient unit.  During his stay he followed the treatment plan.  Medical consult had to be placed as he needed treatment for the neurosyphilis through PICC line which was malfunctioning and so had to be transferred to the medical floor for Infusions.  Today patient offers no complaints but reports feeling anxious about where he is going to go after completing the treatment.  Patient and provider discussed working with [facility] and finding a place given his complicated medical history.  He remains future oriented to and is willing to get outpatient resources. Psych ROS:  Depression: denies feeling hopeless/worthless, denies SI/HI/plan/intent, denies auditory/visual hallucinations, has fair appetite and sleep Anxiety:  denies anxiety and panic attacks Mania (lifetime and current): not displaying any grandiose delusions Psychosis: (lifetime and current): denies auditory/visual hallcuinations    Psychiatric and Social History  Psychiatric History:  Information collected from Patient  Prev Dx/Sx: schizophrenia Current Psych Provider: none Home Meds (current): none Previous Med Trials: unable to recall Therapy: none  Prior Psych Hospitalization: Behavioral Health in [place] in 2001, kept x7 days for attempting to hang himself  Prior  Self Harm: as above Prior Violence: denies  Family Psych History: Psych dx: maternal cousin - schizophrenia, father - schizophrenia Suicide: maternal cousin, hung self in 2003 Substance use: mother - EtOH, cocaine; father - EtOH  Social History:  Developmental Hx: normal Educational Hx: HS 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  Legal Hx: Legal Consequences:  kidnapping and rape charges in 2012, incarcerated x3 yrs  Living Situation: homelessNever married and no children.  Spiritual Hx: Pentecostal belief.  Access to weapons/lethal means: denies   Substance History Alcohol: denies  methamphetamine, cocaine and marijuana. Has been using methamphetamine for the last 1.5 years, uses this every 2 to 4 months, via smoking, last use was 3 days ago. Has been using cocaine since 33 years old, daily via smoking, 4 g a day, last used 3 days ago. Reports that this is his drug of choice. Began using marijuana at 32 years old, about a gram once weekly, last used 3 days ago. Reports history of court ordered substance use treatment at task at which point he was able to maintain sobriety for a period of 4 months, states this was about 8 months ago.   Exam Findings  Physical Exam: Reviewed and agree with the physical exam findings conducted by the medical exam provider Vital Signs:  Temp:  [97.7 F (36.5 C)-99 F (37.2 C)] 97.8 F (36.6 C) (03/16 0818) Pulse Rate:  [72-92] 72 (03/16 0818) Resp:  [16-20] 16 (03/16 0818) BP: (106-124)/(67-75) 114/72 (03/16 0818) SpO2:  [100 %] 100 % (03/16 0818) Blood pressure 114/72, pulse 72, temperature 97.8 F (  36.6 C), resp. rate 16, SpO2 100%. There is no height or weight on file to calculate BMI.    Mental Status Exam: General Appearance: Casual and Fairly Groomed  Orientation:  Full (Time, Place, and Person)  Memory:  Immediate;   Fair Recent;   Fair Remote;   Fair  Concentration:  Concentration: Fair and Attention  Span: Fair  Recall:  Fair  Attention  Fair  Eye Contact:  Fair  Speech:  Clear and Coherent  Language:  Fair  Volume:  Normal  Mood: fine  Affect:  Appropriate  Thought Process:  Coherent  Thought Content:  Logical  Suicidal Thoughts:  No  Homicidal Thoughts:  No  Judgement:  Fair  Insight:  Fair  Psychomotor Activity:  Normal  Akathisia:  No  Fund of Knowledge:  Fair      Assets:  Communication Skills  Cognition:  WNL  ADL's:  Intact  AIMS (if indicated):        Other History   These have been pulled in through the EMR, reviewed, and updated if appropriate.  Family History:  The patient's family history includes Arthritis in his mother; Diabetes in his father; Hypertension in his father.  Medical History: Past Medical History:  Diagnosis Date   Depression    HIV (human immunodeficiency virus infection) (HCC)    Psychosis (HCC)    Substance abuse (HCC)     Surgical History: No past surgical history on file.   Medications:   Current Facility-Administered Medications:    acetaminophen (TYLENOL) tablet 650 mg, 650 mg, Oral, Q6H PRN **OR** acetaminophen (TYLENOL) suppository 650 mg, 650 mg, Rectal, Q6H PRN, Gillis Santa, MD   bictegravir-emtricitabine-tenofovir AF (BIKTARVY) 50-200-25 MG per tablet 1 tablet, 1 tablet, Oral, Daily, Verdene Lennert, MD, 1 tablet at 08/14/23 2952   cyanocobalamin (VITAMIN B12) tablet 1,000 mcg, 1,000 mcg, Oral, Daily, Gillis Santa, MD, 1,000 mcg at 08/14/23 0948   enoxaparin (LOVENOX) injection 40 mg, 40 mg, Subcutaneous, Q24H, Verdene Lennert, MD, 40 mg at 08/13/23 2125   folic acid (FOLVITE) tablet 1 mg, 1 mg, Oral, Daily, Verdene Lennert, MD, 1 mg at 08/14/23 0948   LORazepam (ATIVAN) tablet 2 mg, 2 mg, Oral, Q6H PRN, Tingling, Stephanie, PA-C   melatonin tablet 5 mg, 5 mg, Oral, QHS, Tingling, Stephanie, PA-C, 5 mg at 08/12/23 2051   methocarbamol (ROBAXIN) tablet 500 mg, 500 mg, Oral, Q6H PRN, Tingling, Stephanie, PA-C, 500 mg at  08/13/23 1050   multivitamin with minerals tablet 1 tablet, 1 tablet, Oral, QHS, Aleda Grana, RPH, 1 tablet at 08/13/23 2125   nicotine polacrilex (NICORETTE) gum 4 mg, 4 mg, Oral, PRN, Tingling, Stephanie, PA-C   OLANZapine zydis (ZYPREXA) disintegrating tablet 5 mg, 5 mg, Oral, TID PRN, Tingling, Stephanie, PA-C   ondansetron (ZOFRAN) injection 4 mg, 4 mg, Intravenous, Q6H PRN, Verdene Lennert, MD   penicillin G potassium 12 Million Units in dextrose 5 % 500 mL CONTINUOUS infusion, 12 Million Units, Intravenous, Q12H, Verdene Lennert, MD, Last Rate: 41.7 mL/hr at 08/14/23 1321, 12 Million Units at 08/14/23 1321   polyethylene glycol (MIRALAX / GLYCOLAX) packet 17 g, 17 g, Oral, Daily PRN, Verdene Lennert, MD   QUEtiapine (SEROQUEL) tablet 100 mg, 100 mg, Oral, QHS, Basaraba, Iulia, MD, 100 mg at 08/13/23 2125   sertraline (ZOLOFT) tablet 50 mg, 50 mg, Oral, Daily, Verdene Lennert, MD, 50 mg at 08/14/23 0948   sodium chloride flush (NS) 0.9 % injection 3 mL, 3 mL, Intravenous, Q12H, Verdene Lennert, MD,  3 mL at 08/14/23 0948   sulfamethoxazole-trimethoprim (BACTRIM) 400-80 MG per tablet 1 tablet, 1 tablet, Oral, Daily, Verdene Lennert, MD, 1 tablet at 08/14/23 0948   thiamine (VITAMIN B1) tablet 100 mg, 100 mg, Oral, Daily, Verdene Lennert, MD, 100 mg at 08/14/23 0948  Allergies: Allergies  Allergen Reactions   Fish Allergy Hives and Nausea And Vomiting   Shellfish Allergy Hives and Nausea And Vomiting    Verner Chol, MD

## 2023-08-14 NOTE — Progress Notes (Signed)
 Date of Admission:  08/11/2023    ID: Lee Bridges is a 33 y.o. male  Principal Problem:   Neurosyphilis Active Problems:   HIV (human immunodeficiency virus infection) (HCC)   MDD (major depressive disorder), recurrent episode, moderate (HCC)   AIDS (acquired immune deficiency syndrome) (HCC)   Polysubstance abuse (HCC)    Subjective: Pt getting IV penicillin No issues getting PCN infusion  Medications:   bictegravir-emtricitabine-tenofovir AF  1 tablet Oral Daily   vitamin B-12  1,000 mcg Oral Daily   enoxaparin (LOVENOX) injection  40 mg Subcutaneous Q24H   folic acid  1 mg Oral Daily   melatonin  5 mg Oral QHS   multivitamin with minerals  1 tablet Oral QHS   QUEtiapine  100 mg Oral QHS   sertraline  50 mg Oral Daily   sodium chloride flush  3 mL Intravenous Q12H   sulfamethoxazole-trimethoprim  1 tablet Oral Daily   thiamine  100 mg Oral Daily    Objective: Vital signs in last 24 hours: Patient Vitals for the past 24 hrs:  BP Temp Temp src Pulse Resp SpO2  08/14/23 0818 114/72 97.8 F (36.6 C) -- 72 16 100 %  08/14/23 0349 106/67 98 F (36.7 C) Oral 83 18 100 %  08/13/23 2010 117/75 99 F (37.2 C) -- 82 20 100 %  08/13/23 1427 124/69 97.7 F (36.5 C) -- 92 16 100 %      PHYSICAL EXAM:  General: Alert, cooperative, no distress, appears stated age.  Head: Normocephalic, without obvious abnormality, atraumatic. Eyes: Conjunctivae clear, anicteric sclerae. Pupils are equal ENT Nares normal. No drainage or sinus tenderness. Lips, mucosa, and tongue normal. No Thrush Neck: Supple, symmetrical, no adenopathy, thyroid: non tender no carotid bruit and no JVD. Back: No CVA tenderness. Lungs: Clear to auscultation bilaterally. No Wheezing or Rhonchi. No rales. Heart: Regular rate and rhythm, no murmur, rub or gallop. Abdomen: Soft, non-tender,not distended. Bowel sounds normal. No masses Extremities: atraumatic, no cyanosis. No edema. No clubbing Skin: No rashes  or lesions. Or bruising Lymph: Cervical, supraclavicular normal. Neurologic: Grossly non-focal  Lab Results    Latest Ref Rng & Units 08/14/2023    6:13 AM 08/13/2023    3:10 AM 08/12/2023    5:10 AM  CBC  WBC 4.0 - 10.5 K/uL 6.0  4.8  4.6   Hemoglobin 13.0 - 17.0 g/dL 21.3  08.6  57.8   Hematocrit 39.0 - 52.0 % 32.9  30.5  30.3   Platelets 150 - 400 K/uL 264  258  265        Latest Ref Rng & Units 08/14/2023    6:13 AM 08/13/2023    3:10 AM 08/12/2023    5:10 AM  CMP  Glucose 70 - 99 mg/dL 96  469  88   BUN 6 - 20 mg/dL 18  16  19    Creatinine 0.61 - 1.24 mg/dL 6.29  5.28  4.13   Sodium 135 - 145 mmol/L 134  136  137   Potassium 3.5 - 5.1 mmol/L 4.3  4.3  4.0   Chloride 98 - 111 mmol/L 106  108  106   CO2 22 - 32 mmol/L 23  24  23    Calcium 8.9 - 10.3 mg/dL 9.0  9.2  9.0       Assessment/Plan: AIDS poorly compliant.  On Biktarvy.  Last viral load from 07/29/2023 was 196,000 and CD4 was 171 ( 14%)   Partially treated neurosyphilis.  Received 6 days of IV penicillin from 2/27 until 08/02/2023.  Was sent home with a midline line by the infectious disease team at [facility] but he did not take treatment and he returned to the ED for malfunctioning  line was removed . He left pretty much AMA from the ED and so was prescribed Doxycycline 200mg  BID for 28 days which he did not fill- Doxy is not the standard in [place] guidelines for neurosyphilis ( [place] guidelines 3rd line) Clinically he has no evidence of meningitis or GPI or TD CSF examination showed minimal lymphocytic pleocytosis ( which can be seen in HIV patients  perse) , neg VDRL, normal protein   Can he have a false neg VDRL?    Will treat like neurosyphilis with penicillin Iv 12 million units every 12 hr ocntinuous infusion for 10 days until 08/21/23)   HE also receive 3 weekly doses of long acting benzathine penicillin ( received 1st dose on 08/11/23)     Polysubstance use ( cocaine, meth) and homelessness and mental health issues-  cannot send him home with IV antibiotic or PICC line   MDD, bipolar now with suicidal ideation on quetiapine, sertraline and olanzapine   Discussed the management with the patient

## 2023-08-15 DIAGNOSIS — A523 Neurosyphilis, unspecified: Secondary | ICD-10-CM | POA: Diagnosis not present

## 2023-08-15 LAB — CBC WITH DIFFERENTIAL/PLATELET
Abs Immature Granulocytes: 0.45 10*3/uL — ABNORMAL HIGH (ref 0.00–0.07)
Basophils Absolute: 0.1 10*3/uL (ref 0.0–0.1)
Basophils Relative: 1 %
Eosinophils Absolute: 0.4 10*3/uL (ref 0.0–0.5)
Eosinophils Relative: 6 %
HCT: 32.1 % — ABNORMAL LOW (ref 39.0–52.0)
Hemoglobin: 10.7 g/dL — ABNORMAL LOW (ref 13.0–17.0)
Immature Granulocytes: 7 %
Lymphocytes Relative: 28 %
Lymphs Abs: 1.9 10*3/uL (ref 0.7–4.0)
MCH: 34.9 pg — ABNORMAL HIGH (ref 26.0–34.0)
MCHC: 33.3 g/dL (ref 30.0–36.0)
MCV: 104.6 fL — ABNORMAL HIGH (ref 80.0–100.0)
Monocytes Absolute: 0.7 10*3/uL (ref 0.1–1.0)
Monocytes Relative: 11 %
Neutro Abs: 3.3 10*3/uL (ref 1.7–7.7)
Neutrophils Relative %: 47 %
Platelets: 270 10*3/uL (ref 150–400)
RBC: 3.07 MIL/uL — ABNORMAL LOW (ref 4.22–5.81)
RDW: 12.2 % (ref 11.5–15.5)
Smear Review: NORMAL
WBC: 6.8 10*3/uL (ref 4.0–10.5)
nRBC: 0 % (ref 0.0–0.2)

## 2023-08-15 LAB — MAGNESIUM: Magnesium: 1.7 mg/dL (ref 1.7–2.4)

## 2023-08-15 LAB — BASIC METABOLIC PANEL
Anion gap: 10 (ref 5–15)
BUN: 16 mg/dL (ref 6–20)
CO2: 23 mmol/L (ref 22–32)
Calcium: 9.2 mg/dL (ref 8.9–10.3)
Chloride: 104 mmol/L (ref 98–111)
Creatinine, Ser: 1.15 mg/dL (ref 0.61–1.24)
GFR, Estimated: >60 mL/min (ref >60)
Glucose, Bld: 125 mg/dL — ABNORMAL HIGH (ref 70–99)
Potassium: 3.8 mmol/L (ref 3.5–5.1)
Sodium: 137 mmol/L (ref 135–145)

## 2023-08-15 LAB — PHOSPHORUS: Phosphorus: 2 mg/dL — ABNORMAL LOW (ref 2.5–4.6)

## 2023-08-15 MED ORDER — K PHOS MONO-SOD PHOS DI & MONO 155-852-130 MG PO TABS
500.0000 mg | ORAL_TABLET | Freq: Three times a day (TID) | ORAL | Status: AC
  Administered 2023-08-15 (×3): 500 mg via ORAL
  Filled 2023-08-15 (×3): qty 2

## 2023-08-15 NOTE — Progress Notes (Signed)
 [facility] Progress Note  Patient: Lee Bridges    UJW:[id number]  DOA: 08/11/2023     Date of Service: the patient was seen and examined on 08/15/2023  No chief complaint on file.  Brief hospital course: Savien Mamula is a 33 y.o. 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  TRH consulted for admission for treatment of neurosyphilis.   Mr. Perrier states that he is not experiencing any headache or neck pain at this time.  He was experiencing some left arm numbness and tingling when he had the PICC line and but this is improving since it has been removed.  He also endorses buttock pain with ambulation after receiving an intramuscular penicillin injection yesterday.  He otherwise denies any difficulty with ambulation, dizziness, vision changes, chest pain, shortness of breath, palpitations, nausea, vomiting, diarrhea, abdominal pain.  He is motivated to complete treatment so he can eventually go on to outpatient drug rehabilitation.   Assessment and Plan:  # Neurosyphilis  Patient was recently admitted and diagnosed with neurosyphilis on 07/28/2023 and discharged on 3/5 with plans for continued treatment at home via PICC line.  Unfortunately, patient's PICC line malfunctioned and he has not been receiving treatment.  Infectious disease was consulted at [facility] and recommended readmission to medical floor for treatment. 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  CD4 count has been extremely low for > 1 year, last 171 approximately 2  weeks ago.  - Continue Biktarvy - Continue prophylactic Bactrim   # Polysubstance Abuse History of polysubstance abuse including cocaine as his primary drug.  He is very motivated to quit but has been having trouble finding a outpatient rehabilitation center given he is on the registry. - Daily folic acid, thiamine and multivitamin   # Depression  - Psychiatry will continue to follow while pt is on the medical unit - Continue Zoloft and Seroquel 3/14 as per psych pt does not have SI/HI, so no need of 1:1 monitoring   # Vitamin B12 level 251, goal >400, started on B12 oral supplement   There is no height or weight on file to calculate BMI.  Interventions:  Diet: Regular DVT Prophylaxis: Subcutaneous Lovenox   Advance goals of care discussion: Full code  Family Communication: family was not present at bedside, at the time of interview.  The pt provided permission to discuss medical plan with the family. Opportunity was given to ask question and all questions were answered satisfactorily.   Disposition:  Pt is from [facility], admitted with Neurosyphilis for IV abx for 10 days till 3/24, which precludes a safe discharge. Discharge to BHU vs home, when cleared by ID, will be on Abx till 3/23 and dc plan on 3/24  Subjective: No significant events overnight, resting comfortably in the bed, denies headache in am Pt was upset and wanted someone to help him to make phone calls to make arrangement when leaves the hospital      Physical Exam: General: NAD, lying comfortably Appear in no distress, affect appropriate Eyes: PERRLA ENT: Oral Mucosa Clear, moist  Neck: no JVD,  Cardiovascular: S1 and S2 Present, no Murmur,  Respiratory: good respiratory effort, Bilateral Air entry equal and Decreased, no Crackles, no wheezes Abdomen: Bowel Sound present, Soft and no tenderness,  Skin: no rashes Extremities: no Pedal edema, no calf tenderness Neurologic: without any new focal findings Gait  not checked due to patient safety concerns  Vitals:   08/14/23 1644 08/14/23 2039 08/15/23 0316 08/15/23 0818  BP: 129/70 113/81 (!) 111/49 112/76  Pulse: 86 96 93 73  Resp: 20 18 18 20   Temp: 98 F (36.7 C) 98 F (36.7 C) 98 F (36.7 C) 98.9 F (37.2 C)  TempSrc: Oral Oral Oral   SpO2: 100% 100% 98% 100%    Intake/Output Summary (Last 24 hours) at 08/15/2023 1518 Last data filed at 08/15/2023 1100 Gross per 24 hour  Intake 2319.42 ml  Output --  Net 2319.42 ml   There were no vitals filed for this visit.  Data Reviewed: I have personally reviewed and interpreted daily labs, tele strips, imagings as discussed above. I reviewed all nursing notes, pharmacy notes, vitals, pertinent old records I have discussed plan of care as described above with RN and patient/family.  CBC: Recent Labs  Lab 08/08/23 2043 08/12/23 0510 08/13/23 0310 08/14/23 0613 08/15/23 0423  WBC 4.4 4.6 4.8 6.0 6.8  NEUTROABS 1.5*  --  1.9 2.8 3.3  HGB 13.1 10.4* 10.4* 11.0* 10.7*  HCT 39.1 30.3* 30.5* 32.9* 32.1*  MCV 101.3* 101.3* 100.0 102.2* 104.6*  PLT 324 265 258 264 270   Basic Metabolic Panel: Recent Labs  Lab 08/10/23 1111 08/12/23 0509 08/12/23 0510 08/13/23 0310 08/14/23 0613 08/15/23 0423  NA 136  --  137 136 134* 137  K 4.0  --  4.0 4.3 4.3 3.8  CL 105  --  106 108 106 104  CO2 21*  --  23 24 23 23   GLUCOSE 94  --  88 105* 96 125*  BUN 18  --  19 16 18 16   CREATININE 1.16  --  1.06 1.08 1.23 1.15  CALCIUM 9.2  --  9.0 9.2 9.0 9.2  MG  --  1.7  --  1.8 1.6* 1.7  PHOS  --  3.8  --  2.9 2.5 2.0*    Studies: No results found.  Scheduled Meds:  bictegravir-emtricitabine-tenofovir AF  1 tablet Oral Daily   vitamin B-12  1,000 mcg Oral Daily   enoxaparin (LOVENOX) injection  40 mg Subcutaneous Q24H   folic acid  1 mg Oral Daily   melatonin  5 mg Oral QHS   multivitamin with minerals  1 tablet Oral QHS   phosphorus  500 mg Oral TID   QUEtiapine  100 mg Oral QHS    sertraline  50 mg Oral Daily   sodium chloride flush  3 mL Intravenous Q12H   sulfamethoxazole-trimethoprim  1 tablet Oral Daily   thiamine  100 mg Oral Daily   Continuous Infusions:  penicillin G potassium 12 Million Units in dextrose 5 % 500 mL CONTINUOUS infusion 12 Million Units (08/15/23 0336)   PRN Meds: acetaminophen **OR** acetaminophen, LORazepam, methocarbamol, nicotine polacrilex, OLANZapine zydis, ondansetron (ZOFRAN) IV, polyethylene glycol  Time spent: 35 minutes  Author: Gillis Santa. MD [facility] 08/15/2023 3:18 PM  To reach On-call, see care teams to locate the attending and reach out to them via [email or web address]. If 7PM-7AM, please contact night-coverage If you still have difficulty reaching the attending provider, please page the [facility] (Director on Call)  for [facility] on amion for assistance.

## 2023-08-15 NOTE — Plan of Care (Signed)

## 2023-08-15 NOTE — TOC Progression Note (Addendum)
 Transition of Care [facility]) - Progression Note    Patient Details  Name: Lee Bridges MRN: [id number] Date of Birth: 03-06-1991  [facility] CM/SW Contact  Marlowe Sax, RN Phone Number: 08/15/2023, 11:31 AM  Clinical Narrative:    Met with the patient, after he asked to see me, I asked how Can I help him , he stated that he wanted to go to Drug rehab,  I notified him that I put resources on his AVS to print at DC for housing  Outpatient drug rehab , utilities, food and transportation as well as substance use,  I asked him if he would like me to print them now so he can start making phone calls, He stated that he wanted me to set it up, I explained that I can provide him resources and he can set it up, , I asked him again if he would like me to print it and bring to him so that he can start making calls,   he stated no that because he has nowhere to go when he leaves then he will just kill himself, I let him know that I was going to let his physician know of his statement, he stated ok, I notified the Attending and the Psych physician on call as well as treatment team        Expected Discharge Plan and Services                                               Social Determinants of Health (SDOH) Interventions SDOH Screenings   Food Insecurity: Food Insecurity Present (08/11/2023)  Housing: High Risk (08/11/2023)  Transportation Needs: Unmet Transportation Needs (08/11/2023)  Utilities: At Risk (08/11/2023)  Alcohol Screen: Low Risk  (08/08/2023)  Depression (PHQ2-9): Medium Risk (03/29/2023)  Social Connections: Socially Isolated (08/09/2023)  Tobacco Use: High Risk (08/09/2023)    Readmission Risk Interventions    08/03/2023   11:22 AM  Readmission Risk Prevention Plan  Transportation Screening Complete  Home Care Screening Complete  Medication Review (RN CM) Complete

## 2023-08-15 NOTE — Plan of Care (Signed)

## 2023-08-16 ENCOUNTER — Telehealth: Payer: Self-pay

## 2023-08-16 DIAGNOSIS — A523 Neurosyphilis, unspecified: Secondary | ICD-10-CM | POA: Diagnosis not present

## 2023-08-16 DIAGNOSIS — F339 Major depressive disorder, recurrent, unspecified: Secondary | ICD-10-CM

## 2023-08-16 DIAGNOSIS — B20 Human immunodeficiency virus [HIV] disease: Secondary | ICD-10-CM | POA: Diagnosis not present

## 2023-08-16 LAB — MAGNESIUM: Magnesium: 1.6 mg/dL — ABNORMAL LOW (ref 1.7–2.4)

## 2023-08-16 LAB — BASIC METABOLIC PANEL
Anion gap: 9 (ref 5–15)
BUN: 17 mg/dL (ref 6–20)
CO2: 24 mmol/L (ref 22–32)
Calcium: 9.3 mg/dL (ref 8.9–10.3)
Chloride: 101 mmol/L (ref 98–111)
Creatinine, Ser: 1.23 mg/dL (ref 0.61–1.24)
GFR, Estimated: >60 mL/min (ref >60)
Glucose, Bld: 97 mg/dL (ref 70–99)
Potassium: 4.6 mmol/L (ref 3.5–5.1)
Sodium: 134 mmol/L — ABNORMAL LOW (ref 135–145)

## 2023-08-16 LAB — CBC WITH DIFFERENTIAL/PLATELET
Abs Immature Granulocytes: 0.56 10*3/uL — ABNORMAL HIGH (ref 0.00–0.07)
Basophils Absolute: 0.1 10*3/uL (ref 0.0–0.1)
Basophils Relative: 1 %
Eosinophils Absolute: 0.5 10*3/uL (ref 0.0–0.5)
Eosinophils Relative: 7 %
HCT: 32.5 % — ABNORMAL LOW (ref 39.0–52.0)
Hemoglobin: 11.1 g/dL — ABNORMAL LOW (ref 13.0–17.0)
Immature Granulocytes: 8 %
Lymphocytes Relative: 26 %
Lymphs Abs: 1.8 10*3/uL (ref 0.7–4.0)
MCH: 34.5 pg — ABNORMAL HIGH (ref 26.0–34.0)
MCHC: 34.2 g/dL (ref 30.0–36.0)
MCV: 100.9 fL — ABNORMAL HIGH (ref 80.0–100.0)
Monocytes Absolute: 0.7 10*3/uL (ref 0.1–1.0)
Monocytes Relative: 11 %
Neutro Abs: 3.2 10*3/uL (ref 1.7–7.7)
Neutrophils Relative %: 47 %
Platelets: 273 10*3/uL (ref 150–400)
RBC: 3.22 MIL/uL — ABNORMAL LOW (ref 4.22–5.81)
RDW: 12.2 % (ref 11.5–15.5)
Smear Review: NORMAL
WBC: 6.8 10*3/uL (ref 4.0–10.5)
nRBC: 0 % (ref 0.0–0.2)

## 2023-08-16 LAB — PHOSPHORUS: Phosphorus: 6.6 mg/dL — ABNORMAL HIGH (ref 2.5–4.6)

## 2023-08-16 MED ORDER — MAGNESIUM OXIDE -MG SUPPLEMENT 400 (240 MG) MG PO TABS
400.0000 mg | ORAL_TABLET | Freq: Two times a day (BID) | ORAL | Status: DC
  Administered 2023-08-16 – 2023-08-22 (×13): 400 mg via ORAL
  Filled 2023-08-16 (×13): qty 1

## 2023-08-16 NOTE — Progress Notes (Signed)
 Date of Admission:  08/11/2023    ID: Lee Bridges is a 33 y.o. male  Principal Problem:   Neurosyphilis Active Problems:   HIV (human immunodeficiency virus infection) (HCC)   MDD (major depressive disorder), recurrent episode, moderate (HCC)   AIDS (acquired immune deficiency syndrome) (HCC)   Polysubstance abuse (HCC)    Subjective: Pt getting IV penicillin Had some bleeding from IV site  Medications:   bictegravir-emtricitabine-tenofovir AF  1 tablet Oral Daily   vitamin B-12  1,000 mcg Oral Daily   enoxaparin (LOVENOX) injection  40 mg Subcutaneous Q24H   folic acid  1 mg Oral Daily   magnesium oxide  400 mg Oral BID   melatonin  5 mg Oral QHS   multivitamin with minerals  1 tablet Oral QHS   QUEtiapine  100 mg Oral QHS   sertraline  50 mg Oral Daily   sodium chloride flush  3 mL Intravenous Q12H   sulfamethoxazole-trimethoprim  1 tablet Oral Daily   thiamine  100 mg Oral Daily    Objective: Vital signs in last 24 hours: Patient Vitals for the past 24 hrs:  BP Temp Temp src Pulse Resp SpO2  08/16/23 0747 104/74 97.8 F (36.6 C) -- 69 19 100 %  08/16/23 0421 (!) 103/55 98.2 F (36.8 C) Oral 79 20 99 %  08/15/23 2106 (!) 153/83 98.4 F (36.9 C) Oral 96 20 100 %  08/15/23 1738 121/79 98.5 F (36.9 C) -- (!) 103 18 100 %      PHYSICAL EXAM:  General: Alert, cooperative, no distress, appears stated age.  Lungs: Clear to auscultation bilaterally. No Wheezing or Rhonchi. No rales. Heart: Regular rate and rhythm, no murmur, rub or gallop. Abdomen: Soft, non-tender,not distended. Bowel sounds normal. No masses Extremities: atraumatic, no cyanosis. No edema. No clubbing Skin: No rashes or lesions. Or bruising Lymph: Cervical, supraclavicular normal. Neurologic: Grossly non-focal  Lab Results    Latest Ref Rng & Units 08/16/2023    4:30 AM 08/15/2023    4:23 AM 08/14/2023    6:13 AM  CBC  WBC 4.0 - 10.5 K/uL 6.8  6.8  6.0   Hemoglobin 13.0 - 17.0 g/dL 86.5   78.4  69.6   Hematocrit 39.0 - 52.0 % 32.5  32.1  32.9   Platelets 150 - 400 K/uL 273  270  264        Latest Ref Rng & Units 08/16/2023    4:30 AM 08/15/2023    4:23 AM 08/14/2023    6:13 AM  CMP  Glucose 70 - 99 mg/dL 97  295  96   BUN 6 - 20 mg/dL 17  16  18    Creatinine 0.61 - 1.24 mg/dL 2.84  1.32  4.40   Sodium 135 - 145 mmol/L 134  137  134   Potassium 3.5 - 5.1 mmol/L 4.6  3.8  4.3   Chloride 98 - 111 mmol/L 101  104  106   CO2 22 - 32 mmol/L 24  23  23    Calcium 8.9 - 10.3 mg/dL 9.3  9.2  9.0       Assessment/Plan: AIDS poorly compliant.  On Biktarvy.  Last viral load from 07/29/2023 was 196,000 and CD4 was 171 ( 14%)   Partially treated neurosyphilis.  Received 6 days of IV penicillin from 2/27 until 08/02/2023.  Was sent home with a midline line by the infectious disease team at [facility] but he did not take treatment and he returned  to the ED for malfunctioning  line was removed . He left pretty much AMA from the ED and so was prescribed Doxycycline 200mg  BID for 28 days which he did not fill- Doxy is not the standard in [place] guidelines for neurosyphilis ( [place] guidelines 3rd line) Clinically he has no evidence of meningitis or GPI or TD CSF examination showed minimal lymphocytic pleocytosis ( which can be seen in HIV patients  perse) , neg VDRL, normal protein   Can he have a false neg VDRL?   Being treated like neurosyphilis with penicillin Iv 12 million units every 12 hr ocntinuous infusion for 10 days  HE also will receive 3 weekly doses of long acting benzathine penicillin ( received 1st dose on 08/11/23)    AIDS- last cd4 is 171 and VL > 100k from 2/28/25says he was non compliant before because of homelessness Adherence reinforced   Polysubstance use ( cocaine, meth) and homelessness and mental health issues- cannot send him home with IV antibiotic or PICC line   MDD, bipolar now with suicidal ideation on quetiapine, sertraline and olanzapine   Discussed the  management with the patient Discussed with his HIV provider at [facility]

## 2023-08-16 NOTE — Telephone Encounter (Signed)
 Lee Bridges left voicemail requesting to speak with Lee Bridges. Called him back to see how we could help.  He says he'd like to be transferred back to [facility]. Reports he is frustrated with his current situation and feels like he does not have a good understanding of his health or treatment plan. He is upset that the monitors in his room continue to beep.   Lee Bridges would appreciate if someone on his care team could explain his lab results and treatment plan to him in depth.   Also discussed the need for follow-up once he is discharged in order to connect him to resources. He has lost his job at [employer] because of missing work due to his hospitalizations. He was living in hotels, but now will not have an income.   Sandie Ano, RN

## 2023-08-16 NOTE — Progress Notes (Signed)
 [facility] Progress Note  Patient: Lee Bridges    QIH:[id number]  DOA: 08/11/2023     Date of Service: the patient was seen and examined on 08/16/2023  No chief complaint on file.  Brief hospital course: Eloy Fehl is a 33 y.o. 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  TRH consulted for admission for treatment of neurosyphilis.   Mr. Gahan states that he is not experiencing any headache or neck pain at this time.  He was experiencing some left arm numbness and tingling when he had the PICC line and but this is improving since it has been removed.  He also endorses buttock pain with ambulation after receiving an intramuscular penicillin injection yesterday.  He otherwise denies any difficulty with ambulation, dizziness, vision changes, chest pain, shortness of breath, palpitations, nausea, vomiting, diarrhea, abdominal pain.  He is motivated to complete treatment so he can eventually go on to outpatient drug rehabilitation.   Assessment and Plan:  # Neurosyphilis  Patient was recently admitted and diagnosed with neurosyphilis on 07/28/2023 and discharged on 3/5 with plans for continued treatment at home via PICC line.  Unfortunately, patient's PICC line malfunctioned and he has not been receiving treatment.  Infectious disease was consulted at [facility] and recommended readmission to medical floor for treatment. 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  CD4 count has been extremely low for > 1 year, last 171 approximately 2  weeks ago.  - Continue Biktarvy - Continue prophylactic Bactrim   # Polysubstance Abuse History of polysubstance abuse including cocaine as his primary drug.  He is very motivated to quit but has been having trouble finding a outpatient rehabilitation center given he is on the registry. - Daily folic acid, thiamine and multivitamin   # Depression  - Psychiatry will continue to follow while pt is on the medical unit - Continue Zoloft and Seroquel 3/14 as per psych pt does not have SI/HI, so no need of 1:1 monitoring   # Vitamin B12 level 251, goal >400, started on B12 oral supplement   There is no height or weight on file to calculate BMI.  Interventions:  Diet: Regular DVT Prophylaxis: Subcutaneous Lovenox   Advance goals of care discussion: Full code  Family Communication: family was not present at bedside, at the time of interview.  The pt provided permission to discuss medical plan with the family. Opportunity was given to ask question and all questions were answered satisfactorily.   Disposition:  Pt is from [facility], admitted with Neurosyphilis for IV abx for 10 days till 3/24, which precludes a safe discharge. Discharge to BHU vs home, when cleared by ID, will be on Abx till 3/23 and dc plan on 3/24  Subjective: No significant events overnight, patient was laying comfortably in the bed, denied any complaints.  Physical Exam: General: NAD, lying comfortably Appear in no distress, affect appropriate Eyes: PERRLA ENT: Oral Mucosa Clear, moist  Neck: no JVD,  Cardiovascular: S1 and S2 Present, no Murmur,  Respiratory: good respiratory effort, Bilateral Air entry equal and Decreased,  no Crackles, no wheezes Abdomen: Bowel Sound present, Soft and no tenderness,  Skin: no rashes Extremities: no Pedal edema, no calf tenderness Neurologic: without any new focal findings Gait not checked due to patient safety concerns  Vitals:   08/15/23 1738 08/15/23 2106 08/16/23 0421  08/16/23 0747  BP: 121/79 (!) 153/83 (!) 103/55 104/74  Pulse: (!) 103 96 79 69  Resp: 18 20 20 19   Temp: 98.5 F (36.9 C) 98.4 F (36.9 C) 98.2 F (36.8 C) 97.8 F (36.6 C)  TempSrc:  Oral Oral   SpO2: 100% 100% 99% 100%    Intake/Output Summary (Last 24 hours) at 08/16/2023 1610 Last data filed at 08/15/2023 1900 Gross per 24 hour  Intake 720 ml  Output --  Net 720 ml   There were no vitals filed for this visit.  Data Reviewed: I have personally reviewed and interpreted daily labs, tele strips, imagings as discussed above. I reviewed all nursing notes, pharmacy notes, vitals, pertinent old records I have discussed plan of care as described above with RN and patient/family.  CBC: Recent Labs  Lab 08/12/23 0510 08/13/23 0310 08/14/23 0613 08/15/23 0423 08/16/23 0430  WBC 4.6 4.8 6.0 6.8 6.8  NEUTROABS  --  1.9 2.8 3.3 3.2  HGB 10.4* 10.4* 11.0* 10.7* 11.1*  HCT 30.3* 30.5* 32.9* 32.1* 32.5*  MCV 101.3* 100.0 102.2* 104.6* 100.9*  PLT 265 258 264 270 273   Basic Metabolic Panel: Recent Labs  Lab 08/12/23 0509 08/12/23 0510 08/13/23 0310 08/14/23 0613 08/15/23 0423 08/16/23 0430  NA  --  137 136 134* 137 134*  K  --  4.0 4.3 4.3 3.8 4.6  CL  --  106 108 106 104 101  CO2  --  23 24 23 23 24   GLUCOSE  --  88 105* 96 125* 97  BUN  --  19 16 18 16 17   CREATININE  --  1.06 1.08 1.23 1.15 1.23  CALCIUM  --  9.0 9.2 9.0 9.2 9.3  MG 1.7  --  1.8 1.6* 1.7 1.6*  PHOS 3.8  --  2.9 2.5 2.0* 6.6*    Studies: No results found.  Scheduled Meds:  bictegravir-emtricitabine-tenofovir AF  1 tablet Oral Daily   vitamin B-12  1,000 mcg Oral Daily   enoxaparin (LOVENOX) injection  40 mg Subcutaneous Q24H   folic acid  1 mg Oral Daily   melatonin  5 mg Oral QHS   multivitamin with minerals  1 tablet Oral QHS   QUEtiapine  100 mg Oral QHS   sertraline  50 mg Oral Daily   sodium chloride flush  3 mL Intravenous Q12H   sulfamethoxazole-trimethoprim  1 tablet Oral Daily    thiamine  100 mg Oral Daily   Continuous Infusions:  penicillin G potassium 12 Million Units in dextrose 5 % 500 mL CONTINUOUS infusion 12 Million Units (08/16/23 0429)   PRN Meds: acetaminophen **OR** acetaminophen, LORazepam, methocarbamol, nicotine polacrilex, OLANZapine zydis, ondansetron (ZOFRAN) IV, polyethylene glycol  Time spent: 35 minutes  Author: Gillis Santa. MD [facility] 08/16/2023 9:52 AM  To reach On-call, see care teams to locate the attending and reach out to them via [email or web address]. If 7PM-7AM, please contact night-coverage If you still have difficulty reaching the attending provider, please page the [facility] (Director on Call) for [facility] on amion for assistance.

## 2023-08-16 NOTE — BH Assessment (Signed)
 PT PLACED UNDER  IVC  PAPERS  PER  J LEE NP  INFORMED  Lesly Dukes RN

## 2023-08-16 NOTE — Plan of Care (Signed)
  Education: Add All Knowledge of General Education information will improve Add Today at 2013 - Progressing by Inez Pilgrim, RN Add Health Behavior/Discharge Planning: Add All Ability to manage health-related needs will improve Add Today at 2013 - Progressing by Inez Pilgrim, RN Add Clinical Measurements: Add All Ability to maintain clinical measurements within normal limits will improve Add Today at 2013 - Progressing by Inez Pilgrim, RN Add Will remain free from infection Add Today at 2013 - Progressing by Inez Pilgrim, RN Add Diagnostic test results will improve Add Today at 2013 - Progressing by Inez Pilgrim, RN Add Respiratory complications will improve Add Today at 2013 - Progressing by Inez Pilgrim, RN Add Cardiovascular complication will be avoided Add Today at 2013 - Progressing by Inez Pilgrim, RN Add Activity: Add All Risk for activity intolerance will decrease Add Today at 2013 - Progressing by Inez Pilgrim, RN Add Nutrition: Add All Adequate nutrition will be maintained Add Today at 2013 - Progressing by Inez Pilgrim, RN Add Coping: Add All Level of anxiety will decrease Add Today at 2013 - Progressing by Inez Pilgrim, RN Add Elimination: Add All Will not experience complications related to bowel motility Add Today at 2013 - Progressing by Inez Pilgrim, RN Add Will not experience complications related to urinary retention Add Today at 2013 - Progressing by Inez Pilgrim, RN Add Pain Managment: Add All General experience of comfort will improve and/or be controlled Add Today at 2013 - Progressing by Inez Pilgrim, RN Add Safety: Add All Ability to remain free from injury will improve Add Today at 2013 - Progressing by Inez Pilgrim, RN Add Skin Integrity: Add All Risk for impaired skin integrity will decrease Add Today at 2013 -  Progressing by Inez Pilgrim, RN Add

## 2023-08-16 NOTE — Plan of Care (Signed)

## 2023-08-16 NOTE — Consult Note (Signed)
 [facility] Consult Follow-up  Patient Name: .Lee Bridges  MRN: [id number]  DOB: 24-Aug-1990  Consult Order details:  Orders (From admission, onward)     Start     Ordered   08/12/23 0851  IP CONSULT TO PSYCHIATRY       Ordering Provider: Gillis Santa, MD  Provider:  (Not yet assigned)  Question Answer Comment  Location [facility]   Reason for Consult? Depression and suicidal ideations      08/12/23 0850            Mode of Visit: In person   Psychiatry Consult Evaluation  Service Date: August 16, 2023 LOS:  LOS: 5 days  Chief Complaint "I'm suicidal. I need to go back downstairs when I'm done here"  Primary Psychiatric Diagnoses  MDD  Assessment   Lee Bridges is a 33 y.o. male never married African-American male with a self-reported history of " schizophrenia, bipolar with psychotic features", polysubstance abuse (methamphetamine, cocaine, marijuana), medical history of AIDS, neurosyphilis, noncompliance with medications, presented to [facility] on 3/10 with chief complaints of suicidal ideation without plan and displayed aggressive behaviors.  Patient was admitted to the [facility] regional psychiatric inpatient unit.  During his stay he followed the treatment plan.  Medical consult had to be placed as he needed treatment for the neurosyphilis through PICC line which was malfunctioning and so had to be transferred to the medical floor for Infusions. Psychiatry had been following patient while admitted to the medically floor.   On assessment today, patient endorses conditional suicidality due to concerns about his living situation once he is discharged. He endorses multiple plans. Patient is a registered sex offender which has presented as a significant barrier to stable housing.  He states he is experiencing paranoia of people after him. He states he is "seeing" things although unable to describe. Denies homicidal  ideations. Case was discussed with attending psychiatrist, Dr. Marval Regal, who is recommending inpatient psychiatric admission after patient is medically cleared. When returning to patient's room to discuss disposition, patient was noted to have pulled out his IV, pulled his chair in view of the nursing station, and opened his door. When asked if he had just done this, pulled the chair in front of the nursing station, and opened the door, he said yes. Strongly suspect cluster B personality traits. Discussed disposition with patient and he stated he was now agreeable to completing his IV tx.   Diagnoses:  Active Hospital problems: Principal Problem:   Neurosyphilis Active Problems:   HIV (human immunodeficiency virus infection) (HCC)   MDD (major depressive disorder), recurrent episode, moderate (HCC)   AIDS (acquired immune deficiency syndrome) (HCC)   Polysubstance abuse (HCC)   Plan   ## Psychiatric Medication Recommendations:  -Continue seroquel 100mg  HS for mood stabilization/sleep/depressive sxs/psychosis -Continue zoloft 50mg  every day for depressive sxs -Continue melatonin 5mg  HS for sleep aid -Continue lorazepam 2mg  q6h prn for agitation/anxiety  ## Medical Decision Making Capacity: Not specifically addressed in this encounter  ## Further Work-up:  -- most recent EKG on 07/31/23 had QtC of 469 -- Pertinent labwork reviewed earlier this admission includes: UDS on 08/08/23 +amphetamines, +oxazepam, +cocaine, +methaphetamines, +marijuana  ## Disposition:-- We recommend inpatient psychiatric admission once medically cleared. Patient has been placed under involuntary commitment.  ## Behavioral / Environmental: -Utilize compassion and acknowledge the patient's experiences while setting clear and realistic expectations for care.  ## Safety and Observation Level:  - Based on my  clinical evaluation, I estimate the patient to be at HIGH risk of self harm in the current setting. - At this time,  we recommend  1:1 Observation. This decision is based on my review of the chart including patient's history and current presentation, interview of the patient, mental status examination, and consideration of suicide risk including evaluating suicidal ideation, plan, intent, suicidal or self-harm behaviors, risk factors, and protective factors. This judgment is based on our ability to directly address suicide risk, implement suicide prevention strategies, and develop a safety plan while the patient is in the clinical setting. Please contact our team if there is a concern that risk level has changed.  CSSR Risk Category:C-SSRS RISK CATEGORY: High Risk  Suicide Risk Assessment: Patient has following modifiable risk factors for suicide: active suicidal ideation and recklessness. Patient has following non-modifiable or demographic risk factors for suicide: male gender Patient has the following protective factors against suicide: Access to outpatient mental health care  Thank you for this consult request. Recommendations have been communicated to the primary team.  We will IVC patient and recommend inpatient psychiatric admission at this time.   Lauree Chandler, NP       History of Present Illness   Patient Report:  On approach, patient with bright affect. States that he is "alright, I'm doing". When introducing my role as part of the psychiatry team, he immediately states "I'm suicidal. I need to go back downstairs when I'm done here". Per chart review, he had been denying suicidal ideations in recent assessments with psychiatry. When asked what changed for him, he states he is concerned about his living situation once he discharges from here. He states he only has 4 days until he is homeless. He states if he has a place to discharge to he will no longer feel suicidal. He reports if he is discharged to the street he will kill himself with hanging himself or by dropping off a bridge. Patient is on the  registered sex offender registry. He states he feels he should be on disability for this. He states this has presented as a significant barrier to housing. Patient states he has been calling different housing opportunities today and has been declined due to being a registered sex offender or been told there are months long waitlists. When asked which facilities he called, he states he cannot say because he called all the ones on google.  Patient proceeded to scroll through his phone and show this NP a video on homelessness. He states this is what he is going through. Patient redirected to assessment. He states this NP "better find out something" for him. He states he is experiencing paranoia of people after him. He states he is "seeing" things although unable to describe. Denies homicidal ideations.  Psych ROS:  Endorses suicidal ideations States he is experiencing paranoia of people after him States he is "seeing" things although unable to describe Denies homicidal ideations  Collateral information:  Pt stated there was no collateral to contact for him  Review of Systems  Psychiatric/Behavioral:  Positive for depression and suicidal ideas.     Psychiatric and Social History  Psychiatric History:   Information collected from Patient   Prev Dx/Sx: schizophrenia Current Psych Provider: none Home Meds (current): none Previous Med Trials: unable to recall Therapy: none   Prior Psych Hospitalization: Behavioral Health in [place] in 2001, kept x7 days for attempting to hang himself  Prior Self Harm: as above Prior Violence: denies   Family  Psych History: Psych dx: maternal cousin - schizophrenia, father - schizophrenia Suicide: maternal cousin, hung self in 2003 Substance use: mother - EtOH, cocaine; father - EtOH   Social History:  Developmental Hx: normal Educational Hx: HS 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  Legal Hx: Legal Consequences:   kidnapping and rape charges in 2012, incarcerated x3 yrs  Living Situation: homelessNever married and no children.  Spiritual Hx: Pentecostal belief.  Access to weapons/lethal means: denies    Substance History Alcohol: denies  methamphetamine, cocaine and marijuana. Has been using methamphetamine for the last 1.5 years, uses this every 2 to 4 months, via smoking, last use was 3 days ago. Has been using cocaine since 33 years old, daily via smoking, 4 g a day, last used 3 days ago. Reports that this is his drug of choice. Began using marijuana at 33 years old, about a gram once weekly, last used 3 days ago. Reports history of court ordered substance use treatment at [facility] at which point he was able to maintain sobriety for a period of 4 months, states this was about 8 months ago.  Exam Findings   Vital Signs:  Temp:  [97.8 F (36.6 C)-98.5 F (36.9 C)] 98.4 F (36.9 C) (03/18 1530) Pulse Rate:  [69-103] 103 (03/18 1530) Resp:  [16-20] 16 (03/18 1530) BP: (103-153)/(55-83) 126/76 (03/18 1530) SpO2:  [98 %-100 %] 98 % (03/18 1530) Blood pressure 126/76, pulse (!) 103, temperature 98.4 F (36.9 C), temperature source Oral, resp. rate 16, SpO2 98%. There is no height or weight on file to calculate BMI.  Physical Exam Constitutional:      General: He is not in acute distress.    Appearance: He is not ill-appearing, toxic-appearing or diaphoretic.  Pulmonary:     Effort: Pulmonary effort is normal. No respiratory distress.  Neurological:     Mental Status: He is alert and oriented to person, place, and time.  Psychiatric:        Attention and Perception: Attention and perception normal.        Mood and Affect: Mood is depressed. Affect is labile.        Speech: Speech normal.        Behavior: Behavior is uncooperative and combative.        Thought Content: Thought content is not paranoid or delusional. Thought content includes suicidal ideation. Thought content does not include homicidal  ideation. Thought content includes suicidal plan.        Judgment: Judgment is impulsive and inappropriate.   Mental Status Exam: General Appearance:  Appropriate for environment  Orientation:  Full (Time, Place, and Person)  Memory:   Grossly intact  Concentration:  Concentration: Grossly intact  Recall:   Grossly intact  Attention  Other: Grossly intact  Eye Contact:  Fair  Speech:  Clear and Coherent and Normal Rate  Language:  Fair  Volume:  Normal, elevated at times  Mood: "alright, I'm doing"  Affect:  Labile  Thought Process:  Coherent, Goal Directed, and Linear  Thought Content:  Logical  Suicidal Thoughts:  Yes.  with intent/plan  Homicidal Thoughts:  No  Judgement:  Poor  Insight:   Poor  Psychomotor Activity:  Normal  Akathisia:  No  Fund of Knowledge:   Grossly intact    Assets:  Communication Skills Desire for Improvement Financial Resources/Insurance Resilience  Cognition:  Grossly intact  ADL's:  Intact  AIMS (if indicated):  Other History   These have been pulled in through the EMR, reviewed, and updated if appropriate.  Family History:  The patient's family history includes Arthritis in his mother; Diabetes in his father; Hypertension in his father.  Medical History: Past Medical History:  Diagnosis Date   Depression    HIV (human immunodeficiency virus infection) (HCC)    Psychosis (HCC)    Substance abuse (HCC)    Surgical History: No past surgical history on file.  Medications:   Current Facility-Administered Medications:    acetaminophen (TYLENOL) tablet 650 mg, 650 mg, Oral, Q6H PRN **OR** acetaminophen (TYLENOL) suppository 650 mg, 650 mg, Rectal, Q6H PRN, Gillis Santa, MD   bictegravir-emtricitabine-tenofovir AF (BIKTARVY) 50-200-25 MG per tablet 1 tablet, 1 tablet, Oral, Daily, Verdene Lennert, MD, 1 tablet at 08/16/23 1045   cyanocobalamin (VITAMIN B12) tablet 1,000 mcg, 1,000 mcg, Oral, Daily, Gillis Santa, MD, 1,000 mcg at  08/16/23 1037   enoxaparin (LOVENOX) injection 40 mg, 40 mg, Subcutaneous, Q24H, Verdene Lennert, MD, 40 mg at 08/15/23 2157   folic acid (FOLVITE) tablet 1 mg, 1 mg, Oral, Daily, Verdene Lennert, MD, 1 mg at 08/16/23 1037   LORazepam (ATIVAN) tablet 2 mg, 2 mg, Oral, Q6H PRN, Tingling, Stephanie, PA-C   magnesium oxide (MAG-OX) tablet 400 mg, 400 mg, Oral, BID, Gillis Santa, MD, 400 mg at 08/16/23 1037   melatonin tablet 5 mg, 5 mg, Oral, QHS, Tingling, Stephanie, PA-C, 5 mg at 08/15/23 2158   methocarbamol (ROBAXIN) tablet 500 mg, 500 mg, Oral, Q6H PRN, Tingling, Stephanie, PA-C, 500 mg at 08/13/23 1050   multivitamin with minerals tablet 1 tablet, 1 tablet, Oral, QHS, Aleda Grana, RPH, 1 tablet at 08/15/23 2158   nicotine polacrilex (NICORETTE) gum 4 mg, 4 mg, Oral, PRN, Tingling, Stephanie, PA-C   OLANZapine zydis (ZYPREXA) disintegrating tablet 5 mg, 5 mg, Oral, TID PRN, Tingling, Stephanie, PA-C   ondansetron (ZOFRAN) injection 4 mg, 4 mg, Intravenous, Q6H PRN, Verdene Lennert, MD   penicillin G potassium 12 Million Units in dextrose 5 % 500 mL CONTINUOUS infusion, 12 Million Units, Intravenous, Q12H, Verdene Lennert, MD, Last Rate: 41.7 mL/hr at 08/16/23 0429, 12 Million Units at 08/16/23 0429   polyethylene glycol (MIRALAX / GLYCOLAX) packet 17 g, 17 g, Oral, Daily PRN, Verdene Lennert, MD   QUEtiapine (SEROQUEL) tablet 100 mg, 100 mg, Oral, QHS, Basaraba, Iulia, MD, 100 mg at 08/15/23 2158   sertraline (ZOLOFT) tablet 50 mg, 50 mg, Oral, Daily, Verdene Lennert, MD, 50 mg at 08/16/23 1037   sodium chloride flush (NS) 0.9 % injection 3 mL, 3 mL, Intravenous, Q12H, Verdene Lennert, MD, 3 mL at 08/16/23 1038   sulfamethoxazole-trimethoprim (BACTRIM) 400-80 MG per tablet 1 tablet, 1 tablet, Oral, Daily, Verdene Lennert, MD, 1 tablet at 08/16/23 1045   thiamine (VITAMIN B1) tablet 100 mg, 100 mg, Oral, Daily, Verdene Lennert, MD, 100 mg at 08/16/23 1037  Allergies: Allergies  Allergen  Reactions   Fish Allergy Hives and Nausea And Vomiting   Shellfish Allergy Hives and Nausea And Vomiting   Lauree Chandler, NP

## 2023-08-17 DIAGNOSIS — A523 Neurosyphilis, unspecified: Secondary | ICD-10-CM | POA: Diagnosis not present

## 2023-08-17 LAB — PHOSPHORUS: Phosphorus: 5.9 mg/dL — ABNORMAL HIGH (ref 2.5–4.6)

## 2023-08-17 LAB — MAGNESIUM: Magnesium: 1.8 mg/dL (ref 1.7–2.4)

## 2023-08-17 NOTE — Plan of Care (Signed)
  Problem: Education: Goal: Knowledge of General Education information will improve Description: Including pain rating scale, medication(s)/side effects and non-pharmacologic comfort measures Outcome: Progressing   Problem: Health Behavior/Discharge Planning: Goal: Ability to manage health-related needs will improve Outcome: Progressing   Problem: Clinical Measurements: Goal: Ability to maintain clinical measurements within normal limits will improve Outcome: Progressing Goal: Diagnostic test results will improve Outcome: Progressing   Problem: Nutrition: Goal: Adequate nutrition will be maintained Outcome: Progressing   Problem: Coping: Goal: Level of anxiety will decrease Outcome: Progressing

## 2023-08-17 NOTE — Progress Notes (Signed)
 [facility] Progress Note  Patient: Lee Bridges    UJW:[id number]  DOA: 08/11/2023     Date of Service: the patient was seen and examined on 08/17/2023  No chief complaint on file.  Brief hospital course: Lee Bridges is a 33 y.o. 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  [facility] consulted for admission for treatment of neurosyphilis.   Lee Bridges states that he is not experiencing any headache or neck pain at this time.  He was experiencing some left arm numbness and tingling when he had the PICC line and but this is improving since it has been removed.  He also endorses buttock pain with ambulation after receiving an intramuscular penicillin injection yesterday.  He otherwise denies any difficulty with ambulation, dizziness, vision changes, chest pain, shortness of breath, palpitations, nausea, vomiting, diarrhea, abdominal pain.  He is motivated to complete treatment so he can eventually go on to outpatient drug rehabilitation.   Assessment and Plan:  # Neurosyphilis  Patient was recently admitted and diagnosed with neurosyphilis on 07/28/2023 and discharged on 3/5 with plans for continued treatment at home via PICC line.  Unfortunately, patient's PICC line malfunctioned and he has not been receiving treatment.  Infectious disease was consulted at [facility] and recommended readmission to medical floor for treatment. 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  CD4 count has been extremely low for > 1 year, last 171 approximately 2  weeks ago.  - Continue Biktarvy - Continue prophylactic Bactrim   # Polysubstance Abuse History of polysubstance abuse including cocaine as his primary drug.  He is very motivated to quit but has been having trouble finding a outpatient rehabilitation center given he is on the registry. - Daily folic acid, thiamine and multivitamin   # Depression  - Psychiatry will continue to follow while pt is on the medical unit - Continue Zoloft and Seroquel 3/14 as per psych pt does not have SI/HI, so no need of 1:1 monitoring  3/19 pt was IVC on 08/16/2023 as per psych due to persistent SI   # Vitamin B12 level 251, goal >400, started on B12 oral supplement   There is no height or weight on file to calculate BMI.  Interventions:  Diet: Regular DVT Prophylaxis: Subcutaneous Lovenox   Advance goals of care discussion: Full code  Family Communication: family was not present at bedside, at the time of interview.  The pt provided permission to discuss medical plan with the family. Opportunity was given to ask question and all questions were answered satisfactorily.   Disposition:  Pt is from [facility], admitted with Neurosyphilis for IV abx for 10 days till 3/24, which precludes a safe discharge. Discharge to [facility], when cleared by ID, will be on Abx till 3/23 and dc plan on 3/24. Pt is homeless  Subjective: No significant events overnight, patient was laying comfortably in the bed, denied any complaints. As per patient yesterday IV access came out because it was loose and he was bleeding a little bit, psych thought that he is trying to kill himself  so he was placed on IVC 1:1 but he denies any suicidal thoughts today.  Physical Exam: General: NAD, lying comfortably Appear in no distress, affect appropriate Eyes: PERRLA ENT: Oral Mucosa Clear, moist  Neck: no JVD,  Cardiovascular: S1 and S2 Present, no Murmur,  Respiratory: good respiratory effort, Bilateral Air entry equal and Decreased, no  Crackles, no wheezes Abdomen: Bowel Sound present, Soft and no tenderness,  Skin: no rashes Extremities: no Pedal edema, no calf tenderness Neurologic: without any new focal findings Gait not checked due to patient safety concerns  Vitals:   08/16/23 0747 08/16/23 1530 08/16/23 2133 08/17/23 0809  BP: 104/74 126/76 (!) 153/72 (!) 133/97  Pulse: 69 (!) 103 95 93  Resp: 19 16 20 16   Temp: 97.8 F (36.6 C) 98.4 F (36.9 C) 98.6 F (37 C) 98.6 F (37 C)  TempSrc:  Oral Oral   SpO2: 100% 98% 100% 99%    Intake/Output Summary (Last 24 hours) at 08/17/2023 1128 Last data filed at 08/16/2023 1900 Gross per 24 hour  Intake 240 ml  Output --  Net 240 ml   There were no vitals filed for this visit.  Data Reviewed: I have personally reviewed and interpreted daily labs, tele strips, imagings as discussed above. I reviewed all nursing notes, pharmacy notes, vitals, pertinent old records I have discussed plan of care as described above with RN and patient/family.  CBC: Recent Labs  Lab 08/12/23 0510 08/13/23 0310 08/14/23 0613 08/15/23 0423 08/16/23 0430  WBC 4.6 4.8 6.0 6.8 6.8  NEUTROABS  --  1.9 2.8 3.3 3.2  HGB 10.4* 10.4* 11.0* 10.7* 11.1*  HCT 30.3* 30.5* 32.9* 32.1* 32.5*  MCV 101.3* 100.0 102.2* 104.6* 100.9*  PLT 265 258 264 270 273   Basic Metabolic Panel: Recent Labs  Lab 08/12/23 0510 08/13/23 0310 08/14/23 0613 08/15/23 0423 08/16/23 0430 08/17/23 0138  NA 137 136 134* 137 134*  --   K 4.0 4.3 4.3 3.8 4.6  --   CL 106 108 106 104 101  --   CO2 23 24 23 23 24   --   GLUCOSE 88 105* 96 125* 97  --   BUN 19 16 18 16 17   --   CREATININE 1.06 1.08 1.23 1.15 1.23  --   CALCIUM 9.0 9.2 9.0 9.2 9.3  --   MG  --  1.8 1.6* 1.7 1.6* 1.8  PHOS  --  2.9 2.5 2.0* 6.6* 5.9*    Studies: No results found.  Scheduled Meds:  bictegravir-emtricitabine-tenofovir AF  1 tablet Oral Daily   vitamin B-12  1,000 mcg Oral Daily   enoxaparin (LOVENOX) injection  40 mg  Subcutaneous Q24H   folic acid  1 mg Oral Daily   magnesium oxide  400 mg Oral BID   melatonin  5 mg Oral QHS   multivitamin with minerals  1 tablet Oral QHS   QUEtiapine  100 mg Oral QHS   sertraline  50 mg Oral Daily   sodium chloride flush  3 mL Intravenous Q12H   sulfamethoxazole-trimethoprim  1 tablet Oral Daily   thiamine  100 mg Oral Daily   Continuous Infusions:  penicillin G potassium 12 Million Units in dextrose 5 % 500 mL CONTINUOUS infusion 12 Million Units (08/17/23 0458)   PRN Meds: acetaminophen **OR** acetaminophen, LORazepam, methocarbamol, nicotine polacrilex, OLANZapine zydis, ondansetron (ZOFRAN) IV, polyethylene glycol  Time spent: 35 minutes  Author: Gillis Santa. MD [facility] 08/17/2023 11:28 AM  To reach On-call, see  care teams to locate the attending and reach out to them via [email or web address]. If 7PM-7AM, please contact night-coverage If you still have difficulty reaching the attending provider, please page the [facility] (Director on Call) for [facility] on amion for assistance.

## 2023-08-17 NOTE — Progress Notes (Signed)
   08/17/23 1845  Spiritual Encounters  Type of Visit Initial  Care provided to: Patient  Conversation partners present during encounter Nurse  Reason for visit Routine spiritual support  OnCall Visit Yes   Chaplain saw patient ambulating in the hallway and stopped to offer a compassionate presence.  Patient was proud of progress and Chaplain celebrated and encouraged him.    Rev. Rana M. Earlene Plater, M.Div. Chaplain Resident [facility]

## 2023-08-18 DIAGNOSIS — R45851 Suicidal ideations: Secondary | ICD-10-CM

## 2023-08-18 DIAGNOSIS — A523 Neurosyphilis, unspecified: Secondary | ICD-10-CM | POA: Diagnosis not present

## 2023-08-18 LAB — CK: Total CK: 133 U/L (ref 49–397)

## 2023-08-18 LAB — BASIC METABOLIC PANEL
Anion gap: 7 (ref 5–15)
BUN: 16 mg/dL (ref 6–20)
CO2: 27 mmol/L (ref 22–32)
Calcium: 9 mg/dL (ref 8.9–10.3)
Chloride: 100 mmol/L (ref 98–111)
Creatinine, Ser: 1.26 mg/dL — ABNORMAL HIGH (ref 0.61–1.24)
GFR, Estimated: >60 mL/min (ref >60)
Glucose, Bld: 109 mg/dL — ABNORMAL HIGH (ref 70–99)
Potassium: 4 mmol/L (ref 3.5–5.1)
Sodium: 134 mmol/L — ABNORMAL LOW (ref 135–145)

## 2023-08-18 LAB — MAGNESIUM: Magnesium: 1.8 mg/dL (ref 1.7–2.4)

## 2023-08-18 LAB — PHOSPHORUS: Phosphorus: 5.3 mg/dL — ABNORMAL HIGH (ref 2.5–4.6)

## 2023-08-18 MED ORDER — ZIPRASIDONE MESYLATE 20 MG IM SOLR
10.0000 mg | Freq: Once | INTRAMUSCULAR | Status: AC
  Administered 2023-08-18: 10 mg via INTRAMUSCULAR
  Filled 2023-08-18: qty 20

## 2023-08-18 NOTE — Significant Event (Signed)
 Rapid Response Event Note   Reason for Call :    Initial Focused Assessment: Arrived to unit, Rapid Cancelled. Manuela Schwartz, NP at bedside. I placed eyes on patient who was laying in bed in no acute distress speaking with the provider.        Interventions: N/A   Plan of Care: N/A    Event Summary: As above.   MD Notified: Jon Billings, NP Call Time: 1940 Arrival Time:1943 End [id number]  Rosemary Holms, RN

## 2023-08-18 NOTE — Progress Notes (Signed)
 Date of Admission:  08/11/2023    ID: Lee Bridges is a 33 y.o. male  Principal Problem:   Neurosyphilis Active Problems:   HIV (human immunodeficiency virus infection) (HCC)   MDD (major depressive disorder), recurrent episode, moderate (HCC)   AIDS (acquired immune deficiency syndrome) (HCC)   Polysubstance abuse (HCC)   Episode of recurrent major depressive disorder (HCC)    Subjective: Ambulating in the corridor with sitter  Medications:   bictegravir-emtricitabine-tenofovir AF  1 tablet Oral Daily   vitamin B-12  1,000 mcg Oral Daily   enoxaparin (LOVENOX) injection  40 mg Subcutaneous Q24H   folic acid  1 mg Oral Daily   magnesium oxide  400 mg Oral BID   melatonin  5 mg Oral QHS   multivitamin with minerals  1 tablet Oral QHS   QUEtiapine  100 mg Oral QHS   sertraline  50 mg Oral Daily   sodium chloride flush  3 mL Intravenous Q12H   sulfamethoxazole-trimethoprim  1 tablet Oral Daily   thiamine  100 mg Oral Daily    Objective: Vital signs in last 24 hours: Patient Vitals for the past 24 hrs:  BP Temp Temp src Pulse Resp SpO2  08/18/23 0853 131/75 98.2 F (36.8 C) -- (!) 102 16 99 %  08/17/23 2055 123/83 98.1 F (36.7 C) Oral 92 16 98 %  08/17/23 1810 126/85 98.4 F (36.9 C) Oral 88 18 99 %      PHYSICAL EXAM:  General: Alert, cooperative, no distress, appears stated age.  Lungs: Clear to auscultation bilaterally. No Wheezing or Rhonchi. No rales. Heart: Regular rate and rhythm, no murmur, rub or gallop. Abdomen: Soft, non-tender,not distended. Bowel sounds normal. No masses Extremities: atraumatic, no cyanosis. No edema. No clubbing Skin: No rashes or lesions. Or bruising Lymph: Cervical, supraclavicular normal. Neurologic: Grossly non-focal  Lab Results    Latest Ref Rng & Units 08/16/2023    4:30 AM 08/15/2023    4:23 AM 08/14/2023    6:13 AM  CBC  WBC 4.0 - 10.5 K/uL 6.8  6.8  6.0   Hemoglobin 13.0 - 17.0 g/dL 16.1  09.6  04.5   Hematocrit 39.0 -  52.0 % 32.5  32.1  32.9   Platelets 150 - 400 K/uL 273  270  264        Latest Ref Rng & Units 08/18/2023    8:14 AM 08/16/2023    4:30 AM 08/15/2023    4:23 AM  CMP  Glucose 70 - 99 mg/dL 409  97  811   BUN 6 - 20 mg/dL 16  17  16    Creatinine 0.61 - 1.24 mg/dL 9.14  7.82  9.56   Sodium 135 - 145 mmol/L 134  134  137   Potassium 3.5 - 5.1 mmol/L 4.0  4.6  3.8   Chloride 98 - 111 mmol/L 100  101  104   CO2 22 - 32 mmol/L 27  24  23    Calcium 8.9 - 10.3 mg/dL 9.0  9.3  9.2       Assessment/Plan: AIDS poorly compliant.  On Biktarvy.  Last viral load from 07/29/2023 was 196,000 and CD4 was 171 ( 14%)   Partially treated neurosyphilis.  Received 6 days of IV penicillin from 2/27 until 08/02/2023.  Was sent home with a midline line by the infectious disease team at [facility] but he did not take treatment and he returned to the ED for malfunctioning  line was removed .  He left pretty much AMA from the ED and so was prescribed Doxycycline 200mg  BID for 28 days which he did not fill- Doxy is not the standard in [place] guidelines for neurosyphilis ( [place] guidelines 3rd line) Clinically he has no evidence of meningitis or GPI or TD CSF examination showed minimal lymphocytic pleocytosis ( which can be seen in HIV patients  perse) , neg VDRL, normal protein   Can he have a false neg VDRL?   Being treated like neurosyphilis with penicillin Iv 12 million units every 12 hr ocntinuous infusion for 10 days  HE also will receive 3 weekly doses of long acting benzathine penicillin ( received 1st dose on 08/11/23)    AIDS- last cd4 is 171 and VL > 100k from 2/28/25says he was non compliant before because of homelessness Adherence reinforced  Because of worsening cr will DC bactrim- can give mepron instead for PCP prophylaxis  Polysubstance use ( cocaine, meth) and homelessness and mental health issues- cannot send him home with IV antibiotic or PICC line   MDD, bipolar now with suicidal ideation on  quetiapine, sertraline and olanzapine   Discussed the management with the patient And hospitalist

## 2023-08-18 NOTE — Progress Notes (Addendum)
 [facility] Progress Note  Patient: Lee Bridges    ZOX:[id number]  DOA: 08/11/2023     Date of Service: the patient was seen and examined on 08/18/2023  No chief complaint on file.  Brief hospital course: Lee Bridges is a 33 y.o. 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  TRH consulted for admission for treatment of neurosyphilis.   Lee Bridges states that he is not experiencing any headache or neck pain at this time.  He was experiencing some left arm numbness and tingling when he had the PICC line and but this is improving since it has been removed.  He also endorses buttock pain with ambulation after receiving an intramuscular penicillin injection yesterday.  He otherwise denies any difficulty with ambulation, dizziness, vision changes, chest pain, shortness of breath, palpitations, nausea, vomiting, diarrhea, abdominal pain.  He is motivated to complete treatment so he can eventually go on to outpatient drug rehabilitation.   Assessment and Plan:  # Neurosyphilis  Patient was recently admitted and diagnosed with neurosyphilis on 07/28/2023 and discharged on 3/5 with plans for continued treatment at home via PICC line.  Unfortunately, patient's PICC line malfunctioned and he has not been receiving treatment.  Infectious disease was consulted at [facility] and recommended readmission to medical floor for treatment. 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  CD4 count has been extremely low for > 1 year, last 171 approximately 2  weeks ago.  - Continue Biktarvy - s/p prophylactic Bactrim d/c'd on 08/18/23 due to elevated Creatinine. Encourage oral hydration     # Polysubstance Abuse History of polysubstance abuse including cocaine as his primary drug.  He is very motivated to quit but has been having trouble finding a outpatient rehabilitation center given he is on the registry. - Daily folic acid, thiamine and multivitamin   # Depression  - Psychiatry will continue to follow while pt is on the medical unit - Continue Zoloft and Seroquel 3/14 as per psych pt does not have SI/HI, so no need of 1:1 monitoring  3/19 pt was IVC on 08/16/2023 as per psych due to persistent SI 3/20 as per Psych (Saw this pt for follow up, nothing has change. We will recommend to continue IVC patient and recommend inpatient psychiatric admission at this time. Will reassess patient on 08/22/2023, after his IV penicillin treatment is completed.)  # Vitamin B12 level 251, goal >400, started on B12 oral supplement   There is no height or weight on file to calculate BMI.  Interventions:  Diet: Regular DVT Prophylaxis: Subcutaneous Lovenox   Advance goals of care discussion: Full code  Family Communication: family was not present at bedside, at the time of interview.  The pt provided permission to discuss medical plan with the family. Opportunity was given to ask question and all questions were answered satisfactorily.   Disposition:  Pt is from [facility], admitted with Neurosyphilis for IV abx for 10 days till 3/24, which precludes a safe discharge. Discharge to [facility], when cleared by ID, will be on Abx till 3/23 and  dc plan on 3/24. Pt is homeless  Subjective: No significant events overnight, patient was resting comfortably, denied any complaints.   Physical Exam: General: NAD, lying comfortably Appear in no distress, affect appropriate Eyes: PERRLA ENT: Oral Mucosa Clear, moist  Neck: no JVD,  Cardiovascular: S1 and S2 Present, no  Murmur,  Respiratory: good respiratory effort, Bilateral Air entry equal and Decreased, no Crackles, no wheezes Abdomen: Bowel Sound present, Soft and no tenderness,  Skin: no rashes Extremities: no Pedal edema, no calf tenderness Neurologic: without any new focal findings Gait not checked due to patient safety concerns  Vitals:   08/17/23 0809 08/17/23 1810 08/17/23 2055 08/18/23 0853  BP: (!) 133/97 126/85 123/83 131/75  Pulse: 93 88 92 (!) 102  Resp: 16 18 16 16   Temp: 98.6 F (37 C) 98.4 F (36.9 C) 98.1 F (36.7 C) 98.2 F (36.8 C)  TempSrc:  Oral Oral   SpO2: 99% 99% 98% 99%    Intake/Output Summary (Last 24 hours) at 08/18/2023 1507 Last data filed at 08/18/2023 1300 Gross per 24 hour  Intake 360 ml  Output --  Net 360 ml   There were no vitals filed for this visit.  Data Reviewed: I have personally reviewed and interpreted daily labs, tele strips, imagings as discussed above. I reviewed all nursing notes, pharmacy notes, vitals, pertinent old records I have discussed plan of care as described above with RN and patient/family.  CBC: Recent Labs  Lab 08/12/23 0510 08/13/23 0310 08/14/23 0613 08/15/23 0423 08/16/23 0430  WBC 4.6 4.8 6.0 6.8 6.8  NEUTROABS  --  1.9 2.8 3.3 3.2  HGB 10.4* 10.4* 11.0* 10.7* 11.1*  HCT 30.3* 30.5* 32.9* 32.1* 32.5*  MCV 101.3* 100.0 102.2* 104.6* 100.9*  PLT 265 258 264 270 273   Basic Metabolic Panel: Recent Labs  Lab 08/13/23 0310 08/14/23 0613 08/15/23 0423 08/16/23 0430 08/17/23 0138 08/18/23 0814  NA 136 134* 137 134*  --  134*  K 4.3 4.3 3.8 4.6  --  4.0  CL 108 106 104 101  --  100  CO2 24 23 23 24   --  27  GLUCOSE 105* 96 125* 97  --  109*  BUN 16 18 16 17   --  16  CREATININE 1.08 1.23 1.15 1.23  --  1.26*  CALCIUM 9.2 9.0 9.2 9.3  --  9.0  MG 1.8 1.6* 1.7 1.6* 1.8 1.8  PHOS 2.9 2.5 2.0* 6.6* 5.9* 5.3*    Studies: No results found.  Scheduled Meds:  bictegravir-emtricitabine-tenofovir AF  1 tablet Oral  Daily   vitamin B-12  1,000 mcg Oral Daily   enoxaparin (LOVENOX) injection  40 mg Subcutaneous Q24H   folic acid  1 mg Oral Daily   magnesium oxide  400 mg Oral BID   melatonin  5 mg Oral QHS   multivitamin with minerals  1 tablet Oral QHS   QUEtiapine  100 mg Oral QHS   sertraline  50 mg Oral Daily   sodium chloride flush  3 mL Intravenous Q12H   sulfamethoxazole-trimethoprim  1 tablet Oral Daily   thiamine  100 mg Oral Daily   Continuous Infusions:  penicillin G potassium 12 Million Units in dextrose 5 % 500 mL CONTINUOUS infusion 12 Million Units (08/18/23 0332)   PRN Meds: acetaminophen **OR** acetaminophen, LORazepam, methocarbamol, nicotine polacrilex, OLANZapine zydis, ondansetron (ZOFRAN) IV, polyethylene glycol  Time spent: 35 minutes  Author: Gillis Santa. MD [facility] 08/18/2023 3:07 PM  To reach  On-call, see care teams to locate the attending and reach out to them via [email or web address]. If 7PM-7AM, please contact night-coverage If you still have difficulty reaching the attending provider, please page the [facility] (Director on Call) for [facility] on amion for assistance.

## 2023-08-18 NOTE — Consult Note (Signed)
 Patient Name: .Lee Bridges  MRN: [id number]  DOB: 06-19-1990   Patient is seen for follow-up today.  For detailed consult note please refer to note by Darrick Grinder, NP on 08/16/2023.  Lee Bridges is a 33 y.o. male never married African-American male with a self-reported history of " schizophrenia, bipolar with psychotic features", polysubstance abuse (methamphetamine, cocaine, marijuana), medical history of AIDS, neurosyphilis, noncompliance with medications, presented to [facility] on 3/10 with chief complaints of suicidal ideation without plan and displayed aggressive behaviors.  Patient was admitted to the [facility] regional psychiatric inpatient unit.  During his stay he followed the treatment plan.  Medical consult had to be placed as he needed treatment for the neurosyphilis through PICC line which was malfunctioning and so had to be transferred to the medical floor for Infusions. Psychiatry had been following patient while admitted to the medically floor.  Today during assessment patient continues to endorse suicidal thoughts.  He said "I am not going to complete this treatment for neurosyphilis on my own well".  Patient was informed that IVC will be continued till August 21, 2023 so patient is able to complete the recommended treatment for neurosyphilis.  Patient also reported "you can have a sitter but I am going to kill myself".  Patient reports that he is frustrated and feels hopeless after losing his job.  He said that he was making almost 100 K.  Then later he mentioned that he is not able to find another job because he has a felony record.  Patient got agitated towards the end of the session.  He was rambling on and on about his finances, about losing his job and car, about his family not wanting him.  Patient was in need of constant reassurance, support, and redirection.  Mental Status Exam: General Appearance: Casual and Fairly Groomed  Orientation:  Full (Time,  Place, and Person)  Memory:  Immediate;   Fair Recent;   Fair Remote;   Fair  Concentration:  Concentration: Fair and Attention Span: Fair  Recall:  Fair  Attention  Fair  Eye Contact:  Fair  Speech:  Clear and Coherent, at times loud with angry tone  Language:  Fair  Volume: Variable  Mood:" Not good and angry"  Affect: Agitated  Thought Process:  Coherent  Thought Content: At times illogical  Suicidal Thoughts: Yes  Homicidal Thoughts:  No  Judgement: Impaired  Insight: Shallow  Psychomotor Activity: Increased  Akathisia:  No  Fund of Knowledge:  Fair       Assets:  Communication Skills  Cognition:  WNL  ADL's:  Intact  AIMS (if indicated):      Suicide Risk Assessment: Patient has following modifiable risk factors for suicide: active suicidal ideation and recklessness. Patient has following non-modifiable or demographic risk factors for suicide: male gender Patient has the following protective factors against suicide: Access to outpatient mental health care   Thank you for this consult request. Recommendations have been communicated to the primary team.  We will recommend to continue IVC patient and recommend inpatient psychiatric admission at this time.   Will reassess patient on 08/22/2023, after his IV penicillin treatment is completed.  This is discussed with Dr. Lucianne Muss, and the RN on the floor

## 2023-08-18 NOTE — Progress Notes (Signed)
 Patient aggressive, agitated, insisting on leaving facility for being neglected. Headed out of room, on his way out of facility. Security contacted, patient returned to room. Insisting on leaving room and refusing care.

## 2023-08-19 LAB — BASIC METABOLIC PANEL
Anion gap: 11 (ref 5–15)
BUN: 16 mg/dL (ref 6–20)
CO2: 24 mmol/L (ref 22–32)
Calcium: 9.1 mg/dL (ref 8.9–10.3)
Chloride: 103 mmol/L (ref 98–111)
Creatinine, Ser: 1.25 mg/dL — ABNORMAL HIGH (ref 0.61–1.24)
GFR, Estimated: >60 mL/min (ref >60)
Glucose, Bld: 131 mg/dL — ABNORMAL HIGH (ref 70–99)
Potassium: 4.2 mmol/L (ref 3.5–5.1)
Sodium: 138 mmol/L (ref 135–145)

## 2023-08-19 LAB — CBC
HCT: 29.3 % — ABNORMAL LOW (ref 39.0–52.0)
Hemoglobin: 10.1 g/dL — ABNORMAL LOW (ref 13.0–17.0)
MCH: 34.7 pg — ABNORMAL HIGH (ref 26.0–34.0)
MCHC: 34.5 g/dL (ref 30.0–36.0)
MCV: 100.7 fL — ABNORMAL HIGH (ref 80.0–100.0)
Platelets: 225 10*3/uL (ref 150–400)
RBC: 2.91 MIL/uL — ABNORMAL LOW (ref 4.22–5.81)
RDW: 13.2 % (ref 11.5–15.5)
WBC: 5.5 10*3/uL (ref 4.0–10.5)
nRBC: 0 % (ref 0.0–0.2)

## 2023-08-19 LAB — MAGNESIUM: Magnesium: 2 mg/dL (ref 1.7–2.4)

## 2023-08-19 LAB — PHOSPHORUS: Phosphorus: 6.6 mg/dL — ABNORMAL HIGH (ref 2.5–4.6)

## 2023-08-19 MED ORDER — PENICILLIN G BENZATHINE 1200000 UNIT/2ML IM SUSY
2.4000 10*6.[IU] | PREFILLED_SYRINGE | Freq: Once | INTRAMUSCULAR | Status: AC
  Administered 2023-08-22: 2.4 10*6.[IU] via INTRAMUSCULAR
  Filled 2023-08-19: qty 4
  Filled 2023-08-19: qty 2

## 2023-08-19 NOTE — Plan of Care (Signed)

## 2023-08-19 NOTE — Progress Notes (Signed)
 Progress Note   Lee Bridges: Lee Bridges NWG:[id number] DOB: [birth year] DOA: 08/11/2023     8 DOS: the Lee Bridges was seen and examined on 08/19/2023   Brief hospital course: Lee Bridges is a 33 y.o. male with medical history significant of AIDS with last CD4 count of 171, recently diagnosed neurosyphilis, polysubstance abuse, major depression disorder, bipolar disorder, who is currently transferred from the behavioral health unit for treatment of neurosyphilis.  TRH consulted for admission for treatment of neurosyphilis.   Lee Bridges states that he is not experiencing any headache or neck pain at Lee time.  He was experiencing some left arm numbness and tingling when he had the PICC line and but Lee is improving since it has been removed.  He also endorses buttock pain with ambulation after receiving an intramuscular penicillin injection yesterday.  He otherwise denies any difficulty with ambulation, dizziness, vision changes, chest pain, shortness of breath, palpitations, nausea, vomiting, diarrhea, abdominal pain.  He is motivated to complete treatment so he can eventually go on to outpatient drug rehabilitation.      Assessment and Plan:   # Neurosyphilis  Lee Bridges was recently admitted and diagnosed with neurosyphilis on 07/28/2023 and discharged on 3/5 with plans for continued treatment at home via PICC line.  Unfortunately, Lee Bridges's PICC line malfunctioned and he has not been receiving treatment.  Infectious disease was consulted at [facility] and recommended readmission to medical floor for treatment. 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  CD4 count has been extremely low for > 1 year, last 171 approximately  2 weeks ago.  - Continue Biktarvy - s/p prophylactic Bactrim d/c'd on 08/18/23 due to elevated Creatinine. Encourage oral hydration      # Polysubstance Abuse History of polysubstance abuse including cocaine as his primary drug.  He is very motivated to quit but has been having trouble finding a outpatient rehabilitation center given he is on the registry. - Daily folic acid, thiamine and multivitamin    # Depression  - Psychiatry will continue to follow while Bridges is on the medical unit - Continue Zoloft and Seroquel 3/14 as per psych Bridges does not have SI/HI, so no need of 1:1 monitoring  3/19 Bridges was IVC on 08/16/2023 as per psych due to persistent SI 3/20 as per Psych (Saw Lee Bridges for follow up, nothing has change. We will recommend to continue IVC Lee Bridges and recommend inpatient psychiatric admission at Lee time. Will reassess Lee Bridges on 08/22/2023, after his IV penicillin treatment is completed.)   # Vitamin B12 level 251, goal >400, started on B12 oral supplement         Subjective: Lee Bridges has a sitter at the bedside.  Tried to elope last night 03/20.  Has no new complaints  Physical Exam: Vitals:   08/17/23 2055 08/18/23 0853 08/18/23 1606 08/19/23 0908  BP: 123/83 131/75 114/87 114/76  Pulse: 92 (!) 102 95 98  Resp: 16 16 16 16   Temp:  98.2 F (36.8 C) 98.2 F (36.8 C) 97.8 F (36.6 C)  TempSrc: Oral     SpO2: 98% 99% 100% 100%   General: NAD, lying comfortably Appear in no distress, affect appropriate Eyes: PERRLA ENT:  Oral Mucosa Clear, moist  Neck: no JVD,  Cardiovascular: S1 and S2 Present, no Murmur,  Respiratory: good respiratory effort, Bilateral Air entry equal and Decreased, no Crackles, no wheezes Abdomen: Bowel Sound present, Soft and no tenderness,  Skin: no rashes Extremities: no Pedal edema, no calf tenderness Neurologic: without any new focal findings    Data Reviewed:  Labs reviewed  Family Communication: Plan of care was discussed with  Lee Bridges in detail.  He verbalizes understanding and agrees with the plan.  Disposition: Status is: Inpatient Remains inpatient appropriate because: On continuous IV antibiotics for neurosyphilis  Planned Discharge Destination: Home    Time spent: 34 minutes  Author: Lucile Shutters, MD 08/19/2023 9:21 AM  For on call review [email or web address].

## 2023-08-19 NOTE — Progress Notes (Signed)
 Date of Admission:  08/11/2023    ID: Lee Bridges is a 33 y.o. male  Principal Problem:   Neurosyphilis Active Problems:   HIV (human immunodeficiency virus infection) (HCC)   MDD (major depressive disorder), recurrent episode, moderate (HCC)   AIDS (acquired immune deficiency syndrome) (HCC)   Polysubstance abuse (HCC)   Episode of recurrent major depressive disorder (HCC)    Subjective: Pt tried to leave yesterday and was brought back by security HE is doing okay today Medications:   bictegravir-emtricitabine-tenofovir AF  1 tablet Oral Daily   vitamin B-12  1,000 mcg Oral Daily   enoxaparin (LOVENOX) injection  40 mg Subcutaneous Q24H   folic acid  1 mg Oral Daily   magnesium oxide  400 mg Oral BID   melatonin  5 mg Oral QHS   multivitamin with minerals  1 tablet Oral QHS   [START ON 08/22/2023] penicillin g benzathine (BICILLIN-LA) IM  2.4 Million Units Intramuscular Once   QUEtiapine  100 mg Oral QHS   sertraline  50 mg Oral Daily   sodium chloride flush  3 mL Intravenous Q12H   thiamine  100 mg Oral Daily    Objective: Vital signs in last 24 hours: Patient Vitals for the past 24 hrs:  BP Temp Pulse Resp SpO2  08/19/23 0908 114/76 97.8 F (36.6 C) 98 16 100 %  08/18/23 1606 114/87 98.2 F (36.8 C) 95 16 100 %      PHYSICAL EXAM:  General: Alert, cooperative, no distress, appears stated age.  Lungs: Clear to auscultation bilaterally. No Wheezing or Rhonchi. No rales. Heart: Regular rate and rhythm, no murmur, rub or gallop. Abdomen: Soft, non-tender,not distended. Bowel sounds normal. No masses Extremities: atraumatic, no cyanosis. No edema. No clubbing Skin: No rashes or lesions. Or bruising Lymph: Cervical, supraclavicular normal. Neurologic: Grossly non-focal  Lab Results    Latest Ref Rng & Units 08/19/2023    3:45 AM 08/16/2023    4:30 AM 08/15/2023    4:23 AM  CBC  WBC 4.0 - 10.5 K/uL 5.5  6.8  6.8   Hemoglobin 13.0 - 17.0 g/dL 91.4  78.2  95.6    Hematocrit 39.0 - 52.0 % 29.3  32.5  32.1   Platelets 150 - 400 K/uL 225  273  270        Latest Ref Rng & Units 08/19/2023    3:45 AM 08/18/2023    8:14 AM 08/16/2023    4:30 AM  CMP  Glucose 70 - 99 mg/dL 213  086  97   BUN 6 - 20 mg/dL 16  16  17    Creatinine 0.61 - 1.24 mg/dL 5.78  4.69  6.29   Sodium 135 - 145 mmol/L 138  134  134   Potassium 3.5 - 5.1 mmol/L 4.2  4.0  4.6   Chloride 98 - 111 mmol/L 103  100  101   CO2 22 - 32 mmol/L 24  27  24    Calcium 8.9 - 10.3 mg/dL 9.1  9.0  9.3       Assessment/Plan: AIDS poorly compliant.  On Biktarvy.  Last viral load from 07/29/2023 was 196,000 and CD4 was 171 ( 14%)   Partially treated neurosyphilis.  Received 6 days of IV penicillin from 2/27 until 08/02/2023.  Was sent home with a midline line by the infectious disease team at [facility] but he did not take treatment and he returned to the ED for malfunctioning  line was removed . He  left pretty much AMA from the ED and so was prescribed Doxycycline 200mg  BID for 28 days which he did not fill- Doxy is not the standard in [place] guidelines for neurosyphilis ( [place] guidelines 3rd line) Clinically he has no evidence of meningitis or GPI or TD CSF examination showed minimal lymphocytic pleocytosis ( which can be seen in HIV patients  perse) , neg VDRL, normal protein  Being treated like neurosyphilis with penicillin Iv 12 million units every 12 hr continuous infusion for 10 days. Last 12 mu infusion will finish on 08/21/23 around noon    HE also will receive 3 weekly doses of long acting benzathine penicillin ( received 1st dose on 08/11/23) 2nd dose would be 3/23 and 3rd dose as OP on 3/30    AIDS- on biktarvy last cd4 is 171 and VL > 100k from 2/28/25says he was non compliant before because of homelessness Adherence reinforced  Because of worsening cr  DC bactrim- can give mepron instead for PCP prophylaxis  Polysubstance use ( cocaine, meth) and homelessness and mental health issues- cannot  send him home with IV antibiotic or PICC line   MDD, bipolar now with suicidal ideation on quetiapine, sertraline and olanzapine   Discussed the management with the patient And hospitalist ID will not see him this weekend Call if needed

## 2023-08-20 LAB — BASIC METABOLIC PANEL
Anion gap: 9 (ref 5–15)
BUN: 12 mg/dL (ref 6–20)
CO2: 23 mmol/L (ref 22–32)
Calcium: 9.3 mg/dL (ref 8.9–10.3)
Chloride: 104 mmol/L (ref 98–111)
Creatinine, Ser: 1.15 mg/dL (ref 0.61–1.24)
GFR, Estimated: >60 mL/min (ref >60)
Glucose, Bld: 89 mg/dL (ref 70–99)
Potassium: 4.5 mmol/L (ref 3.5–5.1)
Sodium: 136 mmol/L (ref 135–145)

## 2023-08-20 LAB — CBC
HCT: 30.9 % — ABNORMAL LOW (ref 39.0–52.0)
Hemoglobin: 10.6 g/dL — ABNORMAL LOW (ref 13.0–17.0)
MCH: 35.1 pg — ABNORMAL HIGH (ref 26.0–34.0)
MCHC: 34.3 g/dL (ref 30.0–36.0)
MCV: 102.3 fL — ABNORMAL HIGH (ref 80.0–100.0)
Platelets: 233 10*3/uL (ref 150–400)
RBC: 3.02 MIL/uL — ABNORMAL LOW (ref 4.22–5.81)
RDW: 13.4 % (ref 11.5–15.5)
WBC: 5.5 10*3/uL (ref 4.0–10.5)
nRBC: 0 % (ref 0.0–0.2)

## 2023-08-20 MED ORDER — ORAL CARE MOUTH RINSE: 15.0000 mL | OROMUCOSAL | Status: DC | PRN

## 2023-08-20 NOTE — Progress Notes (Signed)
 PROGRESS NOTE    Lee Bridges  ZOX:[id number] DOB: [birth year] DOA: 08/11/2023 PCP: Pcp, No   Assessment & Plan:   Principal Problem:   Neurosyphilis Active Problems:   AIDS (acquired immune deficiency syndrome) (HCC)   HIV (human immunodeficiency virus infection) (HCC)   MDD (major depressive disorder), recurrent episode, moderate (HCC)   Polysubstance abuse (HCC)   Episode of recurrent major depressive disorder (HCC)  Assessment and Plan:  Neurosyphilis: recently admitted , diagnosed with neurosyphilis on 07/28/2023 and discharged on 3/5 with plans for continued treatment at home via PICC line.  Unfortunately, patient's PICC line malfunctioned and he has not been receiving treatment.  Continue on IV penicillin as per ID    AIDS: w/ CD4 < 200. Continue on biktarvy. PCP ppx w/ bactrim was d/c as per ID.    Polysubstance abuse: urine drug screen positive for amphetamines, cocaine, methamphetamine, marijuana. Received illicit drug cessation counseling already. Motivated to quit but has been having trouble finding a outpatient rehabilitation center given he is on the registry.   Depression: severity unknown. Continue on home dose of zoloft, seroquel. Pt denies any suicidal ideations currently. Psych recommends to continue IVC and recommends inpatient psych admission & will reassess pt on 08/22/23 after IV penicillin treatment is complete    Vitamin B12 deficiency: continue on B12 supplement   Macrocytic anemia: H&H are stable. No need for a transfusion currently     DVT prophylaxis: lovenox  Code Status: full Family Communication:  Disposition Plan: unclear, pt is under IVC currently   Level of care: Med-Surg  Status is: Inpatient Remains inpatient appropriate because: severity of illness    Consultants:  ID Psych   Procedures:   Antimicrobials: IV penicillin G  Subjective: Pt denies any depression or suicidal ideations  Objective: Vitals:   08/18/23 1606 08/19/23  0908 08/19/23 1514 08/19/23 2121  BP: 114/87 114/76 131/72 129/87  Pulse: 95 98 90 82  Resp: 16 16 16 16   Temp: 98.2 F (36.8 C) 97.8 F (36.6 C) 97.9 F (36.6 C) 98.1 F (36.7 C)  TempSrc:    Oral  SpO2: 100% 100% 100% 100%   No intake or output data in the 24 hours ending 08/20/23 0830 There were no vitals filed for this visit.  Examination:  General exam: Appears calm and comfortable  Respiratory system: Clear to auscultation. Respiratory effort normal. Cardiovascular system: S1 & S2+. No rubs, gallops or clicks.  Gastrointestinal system: Abdomen is nondistended, soft and nontender. Normal bowel sounds heard. Central nervous system: Alert and oriented. Moves all extremities  Psychiatry: Judgement and insight appears normal. Mood & affect appropriate.     Data Reviewed: I have personally reviewed following labs and imaging studies  CBC: Recent Labs  Lab 08/14/23 0613 08/15/23 0423 08/16/23 0430 08/19/23 0345 08/20/23 0602  WBC 6.0 6.8 6.8 5.5 5.5  NEUTROABS 2.8 3.3 3.2  --   --   HGB 11.0* 10.7* 11.1* 10.1* 10.6*  HCT 32.9* 32.1* 32.5* 29.3* 30.9*  MCV 102.2* 104.6* 100.9* 100.7* 102.3*  PLT 264 270 273 225 233   Basic Metabolic Panel: Recent Labs  Lab 08/15/23 0423 08/16/23 0430 08/17/23 0138 08/18/23 0814 08/19/23 0345 08/20/23 0602  NA 137 134*  --  134* 138 136  K 3.8 4.6  --  4.0 4.2 4.5  CL 104 101  --  100 103 104  CO2 23 24  --  27 24 23   GLUCOSE 125* 97  --  109* 131* 89  BUN  16 17  --  16 16 12   CREATININE 1.15 1.23  --  1.26* 1.25* 1.15  CALCIUM 9.2 9.3  --  9.0 9.1 9.3  MG 1.7 1.6* 1.8 1.8 2.0  --   PHOS 2.0* 6.6* 5.9* 5.3* 6.6*  --    GFR: Estimated Creatinine Clearance: 69.9 mL/min (by C-G formula based on SCr of 1.15 mg/dL). Liver Function Tests: No results for input(s): "AST", "ALT", "ALKPHOS", "BILITOT", "PROT", "ALBUMIN" in the last 168 hours. No results for input(s): "LIPASE", "AMYLASE" in the last 168 hours. No results for  input(s): "AMMONIA" in the last 168 hours. Coagulation Profile: No results for input(s): "INR", "PROTIME" in the last 168 hours. Cardiac Enzymes: Recent Labs  Lab 08/18/23 0814  CKTOTAL 133   BNP (last 3 results) No results for input(s): "PROBNP" in the last 8760 hours. HbA1C: No results for input(s): "HGBA1C" in the last 72 hours. CBG: No results for input(s): "GLUCAP" in the last 168 hours. Lipid Profile: No results for input(s): "CHOL", "HDL", "LDLCALC", "TRIG", "CHOLHDL", "LDLDIRECT" in the last 72 hours. Thyroid Function Tests: No results for input(s): "TSH", "T4TOTAL", "FREET4", "T3FREE", "THYROIDAB" in the last 72 hours. Anemia Panel: No results for input(s): "VITAMINB12", "FOLATE", "FERRITIN", "TIBC", "IRON", "RETICCTPCT" in the last 72 hours. Sepsis Labs: No results for input(s): "PROCALCITON", "LATICACIDVEN" in the last 168 hours.  No results found for this or any previous visit (from the past 240 hours).       Radiology Studies: No results found.      Scheduled Meds:  bictegravir-emtricitabine-tenofovir AF  1 tablet Oral Daily   vitamin B-12  1,000 mcg Oral Daily   enoxaparin (LOVENOX) injection  40 mg Subcutaneous Q24H   folic acid  1 mg Oral Daily   magnesium oxide  400 mg Oral BID   melatonin  5 mg Oral QHS   multivitamin with minerals  1 tablet Oral QHS   [START ON 08/22/2023] penicillin g benzathine (BICILLIN-LA) IM  2.4 Million Units Intramuscular Once   QUEtiapine  100 mg Oral QHS   sertraline  50 mg Oral Daily   sodium chloride flush  3 mL Intravenous Q12H   thiamine  100 mg Oral Daily   Continuous Infusions:  penicillin G potassium 12 Million Units in dextrose 5 % 500 mL CONTINUOUS infusion 12 Million Units (08/20/23 6440)     LOS: 9 days      Charise Killian, MD [facility] Pager [phone]  If 7PM-7AM, please contact night-coverage [email or web address] 08/20/2023, 8:30 AM

## 2023-08-20 NOTE — Plan of Care (Signed)
  Problem: Clinical Measurements: Goal: Ability to maintain clinical measurements within normal limits will improve Outcome: Progressing   Problem: Activity: Goal: Risk for activity intolerance will decrease Outcome: Progressing   Problem: Pain Managment: Goal: General experience of comfort will improve and/or be controlled Outcome: Progressing   Problem: Safety: Goal: Ability to remain free from injury will improve Outcome: Progressing

## 2023-08-20 NOTE — Progress Notes (Signed)
 Patient has had an uneventful day, resting in his room and walking around the unit with his sitter. Patient has not had any suicide thoughts or conversations. Patient states that he is happy and ready to do whatever it takes to get back on his feet and start working again. Patient was told that he would have to remain IVC until the antibiotic therapy was completed and was explained that a decision would be made at that time. Patient remained calm and said 'OK". We will continue to monitor patient and report any changes in his care as needed.   Patient has requested a doctors note from 07/28/23 - they actual date that he will be released.

## 2023-08-20 NOTE — Plan of Care (Signed)

## 2023-08-21 ENCOUNTER — Encounter (INDEPENDENT_AMBULATORY_CARE_PROVIDER_SITE_OTHER): Payer: Self-pay

## 2023-08-21 LAB — CBC
HCT: 31.9 % — ABNORMAL LOW (ref 39.0–52.0)
Hemoglobin: 10.7 g/dL — ABNORMAL LOW (ref 13.0–17.0)
MCH: 34.7 pg — ABNORMAL HIGH (ref 26.0–34.0)
MCHC: 33.5 g/dL (ref 30.0–36.0)
MCV: 103.6 fL — ABNORMAL HIGH (ref 80.0–100.0)
Platelets: 223 10*3/uL (ref 150–400)
RBC: 3.08 MIL/uL — ABNORMAL LOW (ref 4.22–5.81)
RDW: 13.3 % (ref 11.5–15.5)
WBC: 5.2 10*3/uL (ref 4.0–10.5)
nRBC: 0 % (ref 0.0–0.2)

## 2023-08-21 LAB — BASIC METABOLIC PANEL
Anion gap: 8 (ref 5–15)
BUN: 19 mg/dL (ref 6–20)
CO2: 25 mmol/L (ref 22–32)
Calcium: 9.1 mg/dL (ref 8.9–10.3)
Chloride: 102 mmol/L (ref 98–111)
Creatinine, Ser: 1.27 mg/dL — ABNORMAL HIGH (ref 0.61–1.24)
GFR, Estimated: >60 mL/min (ref >60)
Glucose, Bld: 100 mg/dL — ABNORMAL HIGH (ref 70–99)
Potassium: 4.3 mmol/L (ref 3.5–5.1)
Sodium: 135 mmol/L (ref 135–145)

## 2023-08-21 MED ORDER — NICOTINE 14 MG/24HR TD PT24
14.0000 mg | MEDICATED_PATCH | Freq: Every day | TRANSDERMAL | Status: DC
  Administered 2023-08-21 – 2023-08-22 (×2): 14 mg via TRANSDERMAL
  Filled 2023-08-21 (×2): qty 1

## 2023-08-21 NOTE — Plan of Care (Signed)
 Patient alert and oriented. Remains on 1:1 for safety/suicidal ideation. No distress noted, no complaints voiced. Will continue to monitor.   Problem: Education: Goal: Knowledge of General Education information will improve Description: Including pain rating scale, medication(s)/side effects and non-pharmacologic comfort measures Outcome: Progressing   Problem: Health Behavior/Discharge Planning: Goal: Ability to manage health-related needs will improve Outcome: Progressing   Problem: Clinical Measurements: Goal: Ability to maintain clinical measurements within normal limits will improve Outcome: Progressing Goal: Will remain free from infection Outcome: Progressing Goal: Diagnostic test results will improve Outcome: Progressing Goal: Respiratory complications will improve Outcome: Progressing Goal: Cardiovascular complication will be avoided Outcome: Progressing   Problem: Activity: Goal: Risk for activity intolerance will decrease Outcome: Progressing   Problem: Nutrition: Goal: Adequate nutrition will be maintained Outcome: Progressing   Problem: Coping: Goal: Level of anxiety will decrease Outcome: Progressing   Problem: Elimination: Goal: Will not experience complications related to bowel motility Outcome: Progressing Goal: Will not experience complications related to urinary retention Outcome: Progressing   Problem: Pain Managment: Goal: General experience of comfort will improve and/or be controlled Outcome: Progressing   Problem: Safety: Goal: Ability to remain free from injury will improve Outcome: Progressing   Problem: Skin Integrity: Goal: Risk for impaired skin integrity will decrease Outcome: Progressing

## 2023-08-21 NOTE — Plan of Care (Signed)
  Problem: Coping: Goal: Level of anxiety will decrease Outcome: Progressing   Problem: Pain Managment: Goal: General experience of comfort will improve and/or be controlled Outcome: Progressing   Problem: Skin Integrity: Goal: Risk for impaired skin integrity will decrease Outcome: Progressing

## 2023-08-21 NOTE — Progress Notes (Signed)
 PROGRESS NOTE    Lee Bridges  JXB:[id number] DOB: Oct 01, 1990 DOA: 08/11/2023 PCP: Pcp, No   Assessment & Plan:   Principal Problem:   Neurosyphilis Active Problems:   AIDS (acquired immune deficiency syndrome) (HCC)   HIV (human immunodeficiency virus infection) (HCC)   MDD (major depressive disorder), recurrent episode, moderate (HCC)   Polysubstance abuse (HCC)   Episode of recurrent major depressive disorder (HCC)  Assessment and Plan:  Neurosyphilis: recently admitted , diagnosed with neurosyphilis on 07/28/2023 and discharged on 3/5 with plans for continued treatment via PICC line.  Unfortunately, patient's PICC line malfunctioned and he has not been receiving treatment.  Continue on IV penicillin G as per ID    AIDS: w/ CD4 < 200. Continue on biktarvy. PCP ppx w/ bactrim was d/c as per ID    Polysubstance abuse: urine drug screen positive for amphetamines, cocaine, methamphetamine, marijuana. Received illicit drug cessation counseling already. Motivated to quit but has been having trouble finding a outpatient rehabilitation center given he is on the registry.   Depression: severity unknown. Continue on home dose of sertraline, seroquel. Pt denies any suicidal ideations currently. Psych recommends to continue IVC and recommends inpatient psych admission & will reassess pt on 08/22/23 after IV penicillin treatment is complete    Vitamin B12 deficiency: continue on B12 supplement   Macrocytic anemia: H&H are stable. Will transfuse if Hb < 7.0    DVT prophylaxis: lovenox  Code Status: full Family Communication:  Disposition Plan: unclear, pt is under IVC currently   Level of care: Med-Surg  Status is: Inpatient Remains inpatient appropriate because: severity of illness, requiring IV abxs     Consultants:  ID Psych   Procedures:   Antimicrobials: IV penicillin G  Subjective: Pt c/o fatigue   Objective: Vitals:   08/20/23 2100 08/21/23 0400 08/21/23 0500  08/21/23 0831  BP: 134/86 137/82 114/77 119/78  Pulse: 80 84 78 90  Resp: 18 18 18 18   Temp: 98.5 F (36.9 C) 98.3 F (36.8 C) 97.9 F (36.6 C) 97.8 F (36.6 C)  TempSrc: Oral Oral Oral Oral  SpO2: 100% 100% 100% 99%   No intake or output data in the 24 hours ending 08/21/23 0845 There were no vitals filed for this visit.  Examination:  General exam: Appears comfortable  Respiratory system: clear breath sounds b/l  Cardiovascular system: S1/S2+. No rubs or clicks Gastrointestinal system: abd is soft, NT, ND & normal bowel sounds  Central nervous system: alert and oriented. Moves all extremities  Psychiatry: Judgement and insight appears at baseline. Appropriate mood and affect    Data Reviewed: I have personally reviewed following labs and imaging studies  CBC: Recent Labs  Lab 08/15/23 0423 08/16/23 0430 08/19/23 0345 08/20/23 0602 08/21/23 0228  WBC 6.8 6.8 5.5 5.5 5.2  NEUTROABS 3.3 3.2  --   --   --   HGB 10.7* 11.1* 10.1* 10.6* 10.7*  HCT 32.1* 32.5* 29.3* 30.9* 31.9*  MCV 104.6* 100.9* 100.7* 102.3* 103.6*  PLT 270 273 225 233 223   Basic Metabolic Panel: Recent Labs  Lab 08/15/23 0423 08/16/23 0430 08/17/23 0138 08/18/23 0814 08/19/23 0345 08/20/23 0602 08/21/23 0228  NA 137 134*  --  134* 138 136 135  K 3.8 4.6  --  4.0 4.2 4.5 4.3  CL 104 101  --  100 103 104 102  CO2 23 24  --  27 24 23 25   GLUCOSE 125* 97  --  109* 131* 89 100*  BUN 16 17  --  16 16 12 19   CREATININE 1.15 1.23  --  1.26* 1.25* 1.15 1.27*  CALCIUM 9.2 9.3  --  9.0 9.1 9.3 9.1  MG 1.7 1.6* 1.8 1.8 2.0  --   --   PHOS 2.0* 6.6* 5.9* 5.3* 6.6*  --   --    GFR: Estimated Creatinine Clearance: 63.3 mL/min (A) (by C-G formula based on SCr of 1.27 mg/dL (H)). Liver Function Tests: No results for input(s): "AST", "ALT", "ALKPHOS", "BILITOT", "PROT", "ALBUMIN" in the last 168 hours. No results for input(s): "LIPASE", "AMYLASE" in the last 168 hours. No results for input(s):  "AMMONIA" in the last 168 hours. Coagulation Profile: No results for input(s): "INR", "PROTIME" in the last 168 hours. Cardiac Enzymes: Recent Labs  Lab 08/18/23 0814  CKTOTAL 133   BNP (last 3 results) No results for input(s): "PROBNP" in the last 8760 hours. HbA1C: No results for input(s): "HGBA1C" in the last 72 hours. CBG: No results for input(s): "GLUCAP" in the last 168 hours. Lipid Profile: No results for input(s): "CHOL", "HDL", "LDLCALC", "TRIG", "CHOLHDL", "LDLDIRECT" in the last 72 hours. Thyroid Function Tests: No results for input(s): "TSH", "T4TOTAL", "FREET4", "T3FREE", "THYROIDAB" in the last 72 hours. Anemia Panel: No results for input(s): "VITAMINB12", "FOLATE", "FERRITIN", "TIBC", "IRON", "RETICCTPCT" in the last 72 hours. Sepsis Labs: No results for input(s): "PROCALCITON", "LATICACIDVEN" in the last 168 hours.  No results found for this or any previous visit (from the past 240 hours).       Radiology Studies: No results found.      Scheduled Meds:  bictegravir-emtricitabine-tenofovir AF  1 tablet Oral Daily   vitamin B-12  1,000 mcg Oral Daily   enoxaparin (LOVENOX) injection  40 mg Subcutaneous Q24H   folic acid  1 mg Oral Daily   magnesium oxide  400 mg Oral BID   melatonin  5 mg Oral QHS   multivitamin with minerals  1 tablet Oral QHS   [START ON 08/22/2023] penicillin g benzathine (BICILLIN-LA) IM  2.4 Million Units Intramuscular Once   QUEtiapine  100 mg Oral QHS   sertraline  50 mg Oral Daily   sodium chloride flush  3 mL Intravenous Q12H   thiamine  100 mg Oral Daily   Continuous Infusions:  penicillin G potassium 12 Million Units in dextrose 5 % 500 mL CONTINUOUS infusion 12 Million Units (08/21/23 0416)     LOS: 10 days      Charise Killian, MD [facility] Pager [phone]  If 7PM-7AM, please contact night-coverage [email or web address] 08/21/2023, 8:45 AM

## 2023-08-22 ENCOUNTER — Other Ambulatory Visit: Payer: Self-pay

## 2023-08-22 LAB — BASIC METABOLIC PANEL
Anion gap: 7 (ref 5–15)
BUN: 16 mg/dL (ref 6–20)
CO2: 24 mmol/L (ref 22–32)
Calcium: 9.4 mg/dL (ref 8.9–10.3)
Chloride: 107 mmol/L (ref 98–111)
Creatinine, Ser: 1.08 mg/dL (ref 0.61–1.24)
GFR, Estimated: >60 mL/min (ref >60)
Glucose, Bld: 84 mg/dL (ref 70–99)
Potassium: 4.3 mmol/L (ref 3.5–5.1)
Sodium: 138 mmol/L (ref 135–145)

## 2023-08-22 LAB — CBC
HCT: 31.8 % — ABNORMAL LOW (ref 39.0–52.0)
Hemoglobin: 10.8 g/dL — ABNORMAL LOW (ref 13.0–17.0)
MCH: 34.5 pg — ABNORMAL HIGH (ref 26.0–34.0)
MCHC: 34 g/dL (ref 30.0–36.0)
MCV: 101.6 fL — ABNORMAL HIGH (ref 80.0–100.0)
Platelets: 239 10*3/uL (ref 150–400)
RBC: 3.13 MIL/uL — ABNORMAL LOW (ref 4.22–5.81)
RDW: 13.3 % (ref 11.5–15.5)
WBC: 5.5 10*3/uL (ref 4.0–10.5)
nRBC: 0 % (ref 0.0–0.2)

## 2023-08-22 MED ORDER — ATOVAQUONE 750 MG/5ML PO SUSP
1500.0000 mg | Freq: Every day | ORAL | Status: DC
  Administered 2023-08-22: 1500 mg via ORAL
  Filled 2023-08-22: qty 10

## 2023-08-22 MED ORDER — QUETIAPINE FUMARATE 100 MG PO TABS
100.0000 mg | ORAL_TABLET | Freq: Every day | ORAL | 0 refills | Status: DC
  Filled 2023-08-22: qty 30, 30d supply, fill #0

## 2023-08-22 MED ORDER — CYANOCOBALAMIN 1000 MCG PO TABS
1000.0000 ug | ORAL_TABLET | Freq: Every day | ORAL | 0 refills | Status: DC
  Filled 2023-08-22: qty 30, 30d supply, fill #0

## 2023-08-22 MED ORDER — SERTRALINE HCL 50 MG PO TABS
50.0000 mg | ORAL_TABLET | Freq: Every day | ORAL | 0 refills | Status: DC
  Filled 2023-08-22: qty 30, 30d supply, fill #0

## 2023-08-22 MED ORDER — ATOVAQUONE 750 MG/5ML PO SUSP
1500.0000 mg | Freq: Every day | ORAL | 0 refills | Status: DC
  Filled 2023-08-22: qty 300, 30d supply, fill #0

## 2023-08-22 NOTE — TOC Progression Note (Signed)
 Transition of Care [facility]) - Progression Note    Patient Details  Name: Lee Bridges MRN: [id number] Date of Birth: 1990-08-01  Transition of Care [facility]) CM/SW Contact  Marlowe Sax, RN Phone Number: 08/22/2023, 2:33 PM  Clinical Narrative:    Provided the patient with [employer] bus passes (2) at his request        Expected Discharge Plan and Services         Expected Discharge Date: 08/22/23                                     Social Determinants of Health (SDOH) Interventions SDOH Screenings   Food Insecurity: Food Insecurity Present (08/11/2023)  Housing: High Risk (08/11/2023)  Transportation Needs: Unmet Transportation Needs (08/11/2023)  Utilities: At Risk (08/11/2023)  Alcohol Screen: Low Risk  (08/08/2023)  Depression (PHQ2-9): Medium Risk (03/29/2023)  Social Connections: Socially Isolated (08/09/2023)  Tobacco Use: High Risk (08/09/2023)    Readmission Risk Interventions    08/03/2023   11:22 AM  Readmission Risk Prevention Plan  Transportation Screening Complete  Home Care Screening Complete  Medication Review (RN CM) Complete

## 2023-08-22 NOTE — Discharge Summary (Signed)
 Physician Discharge Summary  Lee Bridges ZOX:[id number] DOB: 01-Dec-1990 DOA: 08/11/2023  PCP: Pcp, No  Admit date: 08/11/2023 Discharge date: 08/22/2023  Admitted From: home  Disposition:  home  Recommendations for Outpatient Follow-up:  Follow up with PCP in 1-2 weeks F/u w/ ID, NP Dixon, on 09/01/23  Home Health: no Equipment/Devices:  Discharge Condition: stable  CODE STATUS: full  Diet recommendation: Regular  Brief/Interim Summary: HPI was taken from Dr. Huel Cote:  Lee Bridges is a 33 y.o. 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  [facility] consulted for admission for treatment of neurosyphilis.   [name] states that he is not experiencing any headache or neck pain at this time.  He was experiencing some left arm numbness and tingling when he had the PICC line and but this is improving since it has been removed.  He also endorses buttock pain with ambulation after receiving an intramuscular penicillin injection yesterday.  He otherwise denies any difficulty with ambulation, dizziness, vision changes, chest pain, shortness of breath, palpitations, nausea, vomiting, diarrhea, abdominal pain.  He is motivated to complete treatment so he can eventually go on to outpatient drug rehabilitation.    Discharge Diagnoses:  Principal Problem:   Neurosyphilis Active Problems:   AIDS (acquired immune deficiency syndrome) (HCC)   HIV (human immunodeficiency virus infection) (HCC)   MDD (major depressive disorder), recurrent episode, moderate (HCC)   Polysubstance abuse (HCC)   Episode of recurrent major depressive disorder (HCC)  Neurosyphilis: recently admitted , diagnosed with neurosyphilis on 07/28/2023 and discharged on 3/5 with plans for continued treatment via PICC line.  Unfortunately, patient's PICC line  malfunctioned and he has not been receiving treatment.  Completed penicillin G course while inpatient    AIDS: w/ CD4 < 200. Continue on biktarvy. PCP ppx w/ bactrim was d/c & pt will start atovaquone as per ID    Polysubstance abuse: urine drug screen positive for amphetamines, cocaine, methamphetamine, marijuana. Received illicit drug cessation counseling already. Motivated to quit but has been having trouble finding a outpatient rehabilitation center given he is on the registry.   Depression: severity unknown. Continue on home dose of sertraline, seroquel. Pt still denies any suicidal ideations. IVC was rescinded by psych, Dr. Irwin Brakeman   Vitamin B12 deficiency: continue on B12 supplement   Macrocytic anemia: H&H are stable. No need for a transfusion currently.   Discharge Instructions  Discharge Instructions     Diet general   Complete by: As directed    Discharge instructions   Complete by: As directed    F/u w/ PCP in 1-2 weeks. F/u w/ ID, NP Dixon, on 09/01/23   Increase activity slowly   Complete by: As directed       Allergies as of 08/22/2023       Reactions   Fish Allergy Hives, Nausea And Vomiting   Shellfish Allergy Hives, Nausea And Vomiting        Medication List     STOP taking these medications    doxycycline 100 MG capsule Commonly known as: VIBRAMYCIN   penicillin G IVPB   sulfamethoxazole-trimethoprim 400-80 MG tablet Commonly known as: BACTRIM       TAKE these medications    atovaquone 750 MG/5ML suspension Commonly known as: MEPRON Take 10 mLs (1,500 mg total) by mouth daily with breakfast. Start taking on: August 23, 2023  Biktarvy 50-200-25 MG Tabs tablet Generic drug: bictegravir-emtricitabine-tenofovir AF Take 1 tablet by mouth daily.   cyanocobalamin 1000 MCG tablet Take 1 tablet (1,000 mcg total) by mouth daily. Start taking on: August 23, 2023   methocarbamol 500 MG tablet Commonly known as: ROBAXIN Take 1 tablet (500 mg  total) by mouth every 6 (six) hours as needed for muscle spasms.   QUEtiapine 100 MG tablet Commonly known as: SEROQUEL Take 1 tablet (100 mg total) by mouth at bedtime. What changed:  medication strength how much to take   sertraline 50 MG tablet Commonly known as: ZOLOFT Take 1 tablet (50 mg total) by mouth daily. Start taking on: August 23, 2023   terbinafine 1 % cream Commonly known as: LAMISIL Apply topically 2 (two) times daily.        Allergies  Allergen Reactions   Fish Allergy Hives and Nausea And Vomiting   Shellfish Allergy Hives and Nausea And Vomiting    Consultations: ID Psych Ophthalmology    Procedures/Studies: VAS [place] UPPER EXTREMITY VENOUS DUPLEX Result Date: 08/05/2023 UPPER VENOUS STUDY  [name]  Date of Exam:   08/05/2023 Medical Rec #: [id number]   Accession #:    [id number] Date of Birth: 16-Sep-1990   Patient Gender: M Patient Age:   33 years Exam Location:  [facility] Procedure:      VAS [place] UPPER EXTREMITY VENOUS DUPLEX Referring Phys: MATTHEW TRIFAN --------------------------------------------------------------------------------  Indications: Swelling, Pain, and evaluate for picc line dislodgement vs thrombosis. Limitations: Bandages and tape. Comparison Study: No priors. Performing Technologist: Salem Sink Sturdivant-Jones RDMS, RVT  Examination Guidelines: A complete evaluation includes B-mode imaging, spectral Doppler, color Doppler, and power Doppler as needed of all accessible portions of each vessel. Bilateral testing is considered an integral part of a complete examination. Limited examinations for reoccurring indications may be performed as noted.  Right Findings: +----------+------------+---------+-----------+----------+-------+ RIGHT     CompressiblePhasicitySpontaneousPropertiesSummary +----------+------------+---------+-----------+----------+-------+ Subclavian               Yes       Yes                       +----------+------------+---------+-----------+----------+-------+  Left Findings: +----------+------------+---------+-----------+----------+-----------------+ LEFT      CompressiblePhasicitySpontaneousProperties     Summary      +----------+------------+---------+-----------+----------+-----------------+ IJV           Full       Yes       Yes                                +----------+------------+---------+-----------+----------+-----------------+ Subclavian    Full       Yes       Yes                                +----------+------------+---------+-----------+----------+-----------------+ Axillary      Full       Yes       Yes                                +----------+------------+---------+-----------+----------+-----------------+ Brachial      Full                                                    +----------+------------+---------+-----------+----------+-----------------+  Radial        Full                                                    +----------+------------+---------+-----------+----------+-----------------+ Ulnar         Full                                                    +----------+------------+---------+-----------+----------+-----------------+ Cephalic    Partial                                 Age Indeterminate +----------+------------+---------+-----------+----------+-----------------+ Basilic       Full                                                    +----------+------------+---------+-----------+----------+-----------------+ Superficial vein thormbosis in the left shoulder, proximal to mid upper arm cephalic vein around the picc line.  Summary:  Right: No evidence of thrombosis in the subclavian.  Left: No evidence of deep vein thrombosis in the upper extremity. Findings consistent with age indeterminate superficial vein thrombosis involving the left cephalic vein.  *See table(s) above for measurements and observations.   Diagnosing physician: Gerarda Fraction Electronically signed by Gerarda Fraction on 08/05/2023 at 4:06:58 PM.    Final    DG Chest 2 View Result Date: 07/29/2023 CLINICAL DATA:  Cough EXAM: CHEST - 2 VIEW COMPARISON:  03/11/2023 FINDINGS: The heart size and mediastinal contours are within normal limits. Both lungs are clear. The visualized skeletal structures are unremarkable. IMPRESSION: No active cardiopulmonary disease. Electronically Signed   By: Duanne Guess D.O.   On: 07/29/2023 18:42   MR BRAIN W WO CONTRAST Result Date: 07/29/2023 CLINICAL DATA:  Provided history: Visual disturbance, peripheral. New acute neuropathy. AIDS. EXAM: MRI HEAD WITHOUT AND WITH CONTRAST TECHNIQUE: Multiplanar, multiecho pulse sequences of the brain and surrounding structures were obtained without and with intravenous contrast. CONTRAST:  5mL GADAVIST GADOBUTROL 1 MMOL/ML IV SOLN COMPARISON:  Non-contrast head CT 07/28/2023. FINDINGS: Brain: No age-advanced or lobar predominant cerebral atrophy. Nonspecific punctate focus of T2 FLAIR hyperintense signal abnormality within the right frontal lobe centrum semiovale (series 6, image 26). No cortical encephalomalacia is identified. There is no acute infarct. No evidence of an intracranial mass. No chronic intracranial blood products. No extra-axial fluid collection. No midline shift. No pathologic intracranial enhancement identified. Vascular: Maintained flow voids within the proximal large arterial vessels. Skull and upper cervical spine: Abnormal T1 hypointense marrow signal within visible portions of the cervical spine. Sinuses/Orbits: No mass or acute finding within the imaged orbits. Minimal mucosal thickening within the right maxillary sinus. 18 mm mucous retention cyst, and minimal background mucosal thickening, within the left maxillary sinus. Minimal mucosal thickening also present within the right sphenoid and bilateral ethmoid sinuses. IMPRESSION: 1. No evidence of an acute  intracranial abnormality. 2. Tiny nonspecific chronic insult within the right frontal lobe white matter. Otherwise unremarkable MRI appearance of the brain. 3. Abnormal T1 hypointense marrow signal within  visible portions of the cervical spine. While this finding can reflect a marrow infiltrative process, the most common causes include chronic anemia, smoking and obesity. 4. Paranasal sinus disease as described. Electronically Signed   By: Jackey Loge D.O.   On: 07/29/2023 12:07   CT Head Wo Contrast Result Date: 07/28/2023 CLINICAL DATA:  Headache, vision changes, and fevers in a patient with AIDS. EXAM: CT HEAD WITHOUT CONTRAST TECHNIQUE: Contiguous axial images were obtained from the base of the skull through the vertex without intravenous contrast. RADIATION DOSE REDUCTION: This exam was performed according to the departmental dose-optimization program which includes automated exposure control, adjustment of the mA and/or kV according to patient size and/or use of iterative reconstruction technique. COMPARISON:  None Available. FINDINGS: Brain: No evidence of acute infarction, hemorrhage, hydrocephalus, extra-axial collection or mass lesion/mass effect. No mass or abscess identified. Vascular: No hyperdense vessel or unexpected calcification. Skull: Normal. Negative for fracture or focal lesion. Sinuses/Orbits: Paranasal sinuses and mastoid air cells are clear. Other: None. IMPRESSION: No acute intracranial abnormalities. Electronically Signed   By: Burman Nieves M.D.   On: 07/28/2023 22:25   (Echo, Carotid, EGD, Colonoscopy, ERCP)    Subjective: Pt denies any pain.    Discharge Exam: Vitals:   08/22/23 1030 08/22/23 1122  BP: (!) 126/97 138/82  Pulse: (!) 124 94  Resp: 20   Temp: 98.3 F (36.8 C)   SpO2: 93%    Vitals:   08/21/23 2022 08/22/23 0533 08/22/23 1030 08/22/23 1122  BP: 118/74 112/61 (!) 126/97 138/82  Pulse: (!) 106 83 (!) 124 94  Resp: 19 19 20    Temp: 98.4 F (36.9  C) 98.1 F (36.7 C) 98.3 F (36.8 C)   TempSrc: Oral Oral Oral   SpO2: 97% 98% 93%     General: Pt is alert, awake, not in acute distress Cardiovascular: S1/S2 +, no rubs, no gallops Respiratory: CTA bilaterally, no wheezing, no rhonchi Abdominal: Soft, NT, ND, bowel sounds + Extremities: no edema, no cyanosis    The results of significant diagnostics from this hospitalization (including imaging, microbiology, ancillary and laboratory) are listed below for reference.     Microbiology: No results found for this or any previous visit (from the past 240 hours).   Labs: BNP (last 3 results) No results for input(s): "BNP" in the last 8760 hours. Basic Metabolic Panel: Recent Labs  Lab 08/16/23 0430 08/17/23 0138 08/18/23 0814 08/19/23 0345 08/20/23 0602 08/21/23 0228 08/22/23 0536  NA 134*  --  134* 138 136 135 138  K 4.6  --  4.0 4.2 4.5 4.3 4.3  CL 101  --  100 103 104 102 107  CO2 24  --  27 24 23 25 24   GLUCOSE 97  --  109* 131* 89 100* 84  BUN 17  --  16 16 12 19 16   CREATININE 1.23  --  1.26* 1.25* 1.15 1.27* 1.08  CALCIUM 9.3  --  9.0 9.1 9.3 9.1 9.4  MG 1.6* 1.8 1.8 2.0  --   --   --   PHOS 6.6* 5.9* 5.3* 6.6*  --   --   --    Liver Function Tests: No results for input(s): "AST", "ALT", "ALKPHOS", "BILITOT", "PROT", "ALBUMIN" in the last 168 hours. No results for input(s): "LIPASE", "AMYLASE" in the last 168 hours. No results for input(s): "AMMONIA" in the last 168 hours. CBC: Recent Labs  Lab 08/16/23 0430 08/19/23 0345 08/20/23 0602 08/21/23 0228 08/22/23 0536  WBC  6.8 5.5 5.5 5.2 5.5  NEUTROABS 3.2  --   --   --   --   HGB 11.1* 10.1* 10.6* 10.7* 10.8*  HCT 32.5* 29.3* 30.9* 31.9* 31.8*  MCV 100.9* 100.7* 102.3* 103.6* 101.6*  PLT 273 225 233 223 239   Cardiac Enzymes: Recent Labs  Lab 08/18/23 0814  CKTOTAL 133   BNP: Invalid input(s): "POCBNP" CBG: No results for input(s): "GLUCAP" in the last 168 hours. D-Dimer No results for  input(s): "DDIMER" in the last 72 hours. Hgb A1c No results for input(s): "HGBA1C" in the last 72 hours. Lipid Profile No results for input(s): "CHOL", "HDL", "LDLCALC", "TRIG", "CHOLHDL", "LDLDIRECT" in the last 72 hours. Thyroid function studies No results for input(s): "TSH", "T4TOTAL", "T3FREE", "THYROIDAB" in the last 72 hours.  Invalid input(s): "FREET3" Anemia work up No results for input(s): "VITAMINB12", "FOLATE", "FERRITIN", "TIBC", "IRON", "RETICCTPCT" in the last 72 hours. Urinalysis    Component Value Date/Time   COLORURINE YELLOW 07/28/2023 1142   APPEARANCEUR CLEAR 07/28/2023 1142   LABSPEC 1.029 07/28/2023 1142   PHURINE 5.0 07/28/2023 1142   GLUCOSEU NEGATIVE 07/28/2023 1142   HGBUR NEGATIVE 07/28/2023 1142   BILIRUBINUR NEGATIVE 07/28/2023 1142   KETONESUR NEGATIVE 07/28/2023 1142   PROTEINUR 30 (A) 07/28/2023 1142   NITRITE NEGATIVE 07/28/2023 1142   LEUKOCYTESUR NEGATIVE 07/28/2023 1142   Sepsis Labs Recent Labs  Lab 08/19/23 0345 08/20/23 0602 08/21/23 0228 08/22/23 0536  WBC 5.5 5.5 5.2 5.5   Microbiology No results found for this or any previous visit (from the past 240 hours).   Time coordinating discharge: Over 30 minutes  SIGNED:   Charise Killian, MD  [facility] 08/22/2023, 2:11 PM Pager   If 7PM-7AM, please contact night-coverage [email or web address]

## 2023-08-22 NOTE — Consult Note (Signed)
 [facility] Consult Follow up  Patient Name: .Lee Bridges  MRN: [id number]  DOB: 04/17/1991  Consult Order details:  Orders (From admission, onward)     Start     Ordered   08/12/23 0851  IP CONSULT TO PSYCHIATRY       Ordering Provider: Gillis Santa, MD  Provider:  (Not yet assigned)  Question Answer Comment  Location [facility]   Reason for Consult? Depression and suicidal ideations      08/12/23 0850             Mode of Visit: In person    Psychiatry Consult Evaluation  Service Date: August 22, 2023 LOS:  LOS: 11 days  Chief Complaint Suicidal thoughts  Primary Psychiatric Diagnoses  MDD Moderate 2.   3.    Assessment  Lee Bridges is a 33 y.o. male  never married African-American male with a self-reported history of " schizophrenia, bipolar with psychotic features", polysubstance abuse (methamphetamine, cocaine, marijuana), medical history of AIDS, neurosyphilis, noncompliance with medications, presented to [facility] on 3/10 with chief complaints of suicidal ideation without plan and displayed aggressive behaviors.  Patient was admitted to the [facility] regional psychiatric inpatient unit.  During his stay he followed the treatment plan.  Medical consult had to be placed as he needed treatment for the neurosyphilis through PICC line which was malfunctioning and so had to be transferred to the medical floor for Infusions.  Today on assessment patient remains psychiatrically stable, denies SI/HI/intent/plan, not responding to stimuli, denies AVH.  Patient sees future oriented and is willing to participate in outpatient mental health services.  Reached out to his family and they are very comfortable with his discharge.  Patient does not meet IVC criteria anymore.  Patient is psychiatrically cleared for discharge.    Diagnoses:  Active Hospital problems: Principal Problem:   Neurosyphilis Active Problems:   HIV  (human immunodeficiency virus infection) (HCC)   MDD (major depressive disorder), recurrent episode, moderate (HCC)   AIDS (acquired immune deficiency syndrome) (HCC)   Polysubstance abuse (HCC)   Episode of recurrent major depressive disorder (HCC)    Plan   ## Psychiatric Medication Recommendations:   Continue Seroquel 100 mg HS for mood stabilization/sleep/depressive sxs/psychosis -- Continue Zoloft 50 mg every day for depressive sxs -- Continue melatonin 5 mg HS for sleep aid  -- Continue Lorazepam 2 mg q6h prn for agitation/anxiety   ## Medical Decision Making Capacity: Not specifically addressed in this encounter  ## Further Work-up:   -- most recent EKG on 07/31/23 had QtC of 469  ## Disposition:-- will be determined  ## Behavioral / Environmental: -Utilize compassion and acknowledge the patient's experiences while setting clear and realistic expectations for care.    ## Safety and Observation Level:  - Based on my clinical evaluation, I estimate the patient to be at NO risk of self harm in the current setting. - At this time, we recommend  routine. This decision is based on my review of the chart including patient's history and current presentation, interview of the patient, mental status examination, and consideration of suicide risk including evaluating suicidal ideation, plan, intent, suicidal or self-harm behaviors, risk factors, and protective factors. This judgment is based on our ability to directly address suicide risk, implement suicide prevention strategies, and develop a safety plan while the patient is in the clinical setting. Please contact our team if there is a concern that risk level has changed.  CSSR  Risk Category: Low risk  Suicide Risk Assessment: Patient has following modifiable risk factors for suicide: lack of access to outpatient mental health resources, which we are addressing by providing outpatient MH resources. Patient has following non-modifiable  or demographic risk factors for suicide: male gender and psychiatric hospitalization Patient has the following protective factors against suicide: Cultural, spiritual, or religious beliefs that discourage suicide and Frustration tolerance  Thank you for this consult request. Recommendations have been communicated to the primary team.  We will sign off at this time.   Lee Chol, MD       History of Present Illness  Lee Bridges is a 33 y.o. male  never married African-American male with a self-reported history of " schizophrenia, bipolar with psychotic features", polysubstance abuse (methamphetamine, cocaine, marijuana), medical history of AIDS, neurosyphilis, noncompliance with medications, presented to [facility] on 3/10 with chief complaints of suicidal ideation without plan and displayed aggressive behaviors.  Patient was admitted to the [facility] regional psychiatric inpatient unit.  During his stay he followed the treatment plan.  Medical consult had to be placed as he needed treatment for the neurosyphilis through PICC line which was malfunctioning and so had to be transferred to the medical floor for Infusions.    Today on interview patient is noted to be in a very good mood.  He reports that he got his job back and his boyfriend came back to see him.  He reports that he is very happy that his life is falling back in track.  He reports he has to report to his job tomorrow morning 6 AM.  He gave the phone numbers to his sister and mom for the provider to talk.  He reports that his mood is more stable and he has follow-ups with Mr. Lee Bridges at behavioral health clinic in [place] and he will be following up with the ID clinic.  He denies SI/HI/plan.  He denies auditory/visual hallucinations.  This provider called his Lee Bridges [id number] and sister was very comfortable taking him home.  She was educated about safety in the house by locking up the firearms or any  lethal weapons and locking of the medication cabinet.  She offers no complaints or concerns. Psych ROS:  Depression: denies feeling hopeless/worthless, denies SI/HI/plan/intent, denies auditory/visual hallucinations, has fair appetite and sleep Anxiety:  denies anxiety and panic attacks Mania (lifetime and current): not displaying any grandiose delusions Psychosis: (lifetime and current): denies auditory/visual hallcuinations    Psychiatric and Social History  Psychiatric History:  Information collected from Patient  Prev Dx/Sx: schizophrenia Current Psych Provider: none Home Meds (current): none Previous Med Trials: unable to recall Therapy: none  Prior Psych Hospitalization: Behavioral Health in [place] in 2001, kept x7 days for attempting to hang himself  Prior Self Harm: as above Prior Violence: denies  Family Psych History: Psych dx: maternal cousin - schizophrenia, father - schizophrenia Suicide: maternal cousin, hung self in 2003 Substance use: mother - EtOH, cocaine; father - EtOH  Social History:  Developmental Hx: normal Educational Hx: HS 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  Legal Hx: Legal Consequences:  kidnapping and rape charges in 2012, incarcerated x3 yrs  Living Situation: homelessNever married and no children.  Spiritual Hx: Pentecostal belief.  Access to weapons/lethal means: denies   Substance History Alcohol: denies  methamphetamine, cocaine and marijuana. Has been using methamphetamine for the last 1.5 years, uses this every 2 to 4 months, via smoking,  last use was 3 days ago. Has been using cocaine since 33 years old, daily via smoking, 4 g a day, last used 3 days ago. Reports that this is his drug of choice. Began using marijuana at 33 years old, about a gram once weekly, last used 3 days ago. Reports history of court ordered substance use treatment at [facility] at which point he was able to maintain sobriety for a  period of 4 months, states this was about 8 months ago.   Exam Findings  Physical Exam: Reviewed and agree with the physical exam findings conducted by the medical exam provider Vital Signs:  Temp:  [98.1 F (36.7 C)-98.4 F (36.9 C)] 98.3 F (36.8 C) (03/24 1030) Pulse Rate:  [83-124] 94 (03/24 1122) Resp:  [19-20] 20 (03/24 1030) BP: (112-138)/(61-97) 138/82 (03/24 1122) SpO2:  [93 %-98 %] 93 % (03/24 1030) Blood pressure 138/82, pulse 94, temperature 98.3 F (36.8 C), temperature source Oral, resp. rate 20, SpO2 93%. There is no height or weight on file to calculate BMI.    Mental Status Exam: General Appearance: Casual and Fairly Groomed  Orientation:  Full (Time, Place, and Person)  Memory:  Immediate;   Fair Recent;   Fair Remote;   Fair  Concentration:  Concentration: Fair and Attention Span: Fair  Recall:  Fair  Attention  Fair  Eye Contact:  Fair  Speech:  Clear and Coherent  Language:  Fair  Volume:  Normal  Mood: fine  Affect:  Appropriate  Thought Process:  Coherent  Thought Content:  Logical  Suicidal Thoughts:  No  Homicidal Thoughts:  No  Judgement:  Fair  Insight:  Fair  Psychomotor Activity:  Normal  Akathisia:  No  Fund of Knowledge:  Fair      Assets:  Communication Skills  Cognition:  WNL  ADL's:  Intact  AIMS (if indicated):        Other History   These have been pulled in through the EMR, reviewed, and updated if appropriate.  Family History:  The patient's family history includes Arthritis in his mother; Diabetes in his father; Hypertension in his father.  Medical History: Past Medical History:  Diagnosis Date   Depression    HIV (human immunodeficiency virus infection) (HCC)    Psychosis (HCC)    Substance abuse (HCC)     Surgical History: No past surgical history on file.   Medications:   Current Facility-Administered Medications:    acetaminophen (TYLENOL) tablet 650 mg, 650 mg, Oral, Q6H PRN, 650 mg at 08/17/23 2050  **OR** acetaminophen (TYLENOL) suppository 650 mg, 650 mg, Rectal, Q6H PRN, Lee Santa, MD   atovaquone (MEPRON) 750 MG/5ML suspension 1,500 mg, 1,500 mg, Oral, Q breakfast, Ravishankar, Jayashree, MD, 1,500 mg at 08/22/23 1243   bictegravir-emtricitabine-tenofovir AF (BIKTARVY) 50-200-25 MG per tablet 1 tablet, 1 tablet, Oral, Daily, Verdene Lennert, MD, 1 tablet at 08/22/23 1033   cyanocobalamin (VITAMIN B12) tablet 1,000 mcg, 1,000 mcg, Oral, Daily, Lee Santa, MD, 1,000 mcg at 08/22/23 1033   enoxaparin (LOVENOX) injection 40 mg, 40 mg, Subcutaneous, Q24H, Huel Cote, Iulia, MD, 40 mg at 08/21/23 2113   folic acid (FOLVITE) tablet 1 mg, 1 mg, Oral, Daily, Verdene Lennert, MD, 1 mg at 08/22/23 1033   LORazepam (ATIVAN) tablet 2 mg, 2 mg, Oral, Q6H PRN, Tingling, Stephanie, PA-C, 2 mg at 08/22/23 1033   magnesium oxide (MAG-OX) tablet 400 mg, 400 mg, Oral, BID, Lee Santa, MD, 400 mg at 08/22/23 1033   melatonin tablet 5  mg, 5 mg, Oral, QHS, Tingling, Stephanie, PA-C, 5 mg at 08/21/23 2113   methocarbamol (ROBAXIN) tablet 500 mg, 500 mg, Oral, Q6H PRN, Tingling, Stephanie, PA-C, 500 mg at 08/22/23 1033   multivitamin with minerals tablet 1 tablet, 1 tablet, Oral, QHS, Aleda Grana, RPH, 1 tablet at 08/21/23 2113   nicotine (NICODERM CQ - dosed in mg/24 hours) patch 14 mg, 14 mg, Transdermal, Daily, Charise Killian, MD, 14 mg at 08/22/23 1035   nicotine polacrilex (NICORETTE) gum 4 mg, 4 mg, Oral, PRN, Tingling, Stephanie, PA-C, 4 mg at 08/20/23 2131   OLANZapine zydis (ZYPREXA) disintegrating tablet 5 mg, 5 mg, Oral, TID PRN, Tingling, Stephanie, PA-C   ondansetron (ZOFRAN) injection 4 mg, 4 mg, Intravenous, Q6H PRN, Verdene Lennert, MD   Oral care mouth rinse, 15 mL, Mouth Rinse, PRN, Charise Killian, MD   polyethylene glycol (MIRALAX / GLYCOLAX) packet 17 g, 17 g, Oral, Daily PRN, Verdene Lennert, MD, 17 g at 08/17/23 0958   QUEtiapine (SEROQUEL) tablet 100 mg, 100 mg, Oral,  QHS, Verdene Lennert, MD, 100 mg at 08/21/23 2113   sertraline (ZOLOFT) tablet 50 mg, 50 mg, Oral, Daily, Verdene Lennert, MD, 50 mg at 08/22/23 1033   sodium chloride flush (NS) 0.9 % injection 3 mL, 3 mL, Intravenous, Q12H, Verdene Lennert, MD, 3 mL at 08/20/23 2121   thiamine (VITAMIN B1) tablet 100 mg, 100 mg, Oral, Daily, Verdene Lennert, MD, 100 mg at 08/22/23 1033  Current Outpatient Medications:    bictegravir-emtricitabine-tenofovir AF (BIKTARVY) 50-200-25 MG TABS tablet, Take 1 tablet by mouth daily., Disp: 30 tablet, Rfl: 0   methocarbamol (ROBAXIN) 500 MG tablet, Take 1 tablet (500 mg total) by mouth every 6 (six) hours as needed for muscle spasms., Disp: 20 tablet, Rfl: 0   terbinafine (LAMISIL) 1 % cream, Apply topically 2 (two) times daily., Disp: , Rfl:    [START ON 08/23/2023] atovaquone (MEPRON) 750 MG/5ML suspension, Take 10 mLs (1,500 mg total) by mouth daily with breakfast., Disp: 300 mL, Rfl: 0   [START ON 08/23/2023] cyanocobalamin 1000 MCG tablet, Take 1 tablet (1,000 mcg total) by mouth daily., Disp: 30 tablet, Rfl: 0   QUEtiapine (SEROQUEL) 100 MG tablet, Take 1 tablet (100 mg total) by mouth at bedtime., Disp: 30 tablet, Rfl: 0   [START ON 08/23/2023] sertraline (ZOLOFT) 50 MG tablet, Take 1 tablet (50 mg total) by mouth daily., Disp: 30 tablet, Rfl: 0  Allergies: Allergies  Allergen Reactions   Fish Allergy Hives and Nausea And Vomiting   Shellfish Allergy Hives and Nausea And Vomiting    Lee Chol, MD

## 2023-08-22 NOTE — Plan of Care (Signed)

## 2023-08-22 NOTE — Plan of Care (Signed)

## 2023-08-25 ENCOUNTER — Telehealth: Payer: Self-pay

## 2023-08-25 NOTE — Telephone Encounter (Signed)
 Received scheduling request from [facility] stating that he felt tired and weak, like he was dying. Tried calling [facility] to get some additional information. Call was rejected.   Sandie Ano, RN

## 2023-08-26 NOTE — Telephone Encounter (Signed)
 I called Nahun on the phone and was able to reach him today.   He states he is feeling better than when he notified us about feeling like he was dying.  He does not have a follow-up with his mental health team and he is not sure who to call.  I told him to reach out to his existing psychiatrist, Otila Back to schedule an appointment.  I will route his discharge summary to him if it was not done so already following his behavioral health admission.  Ami is having leg pain and what is described as a "blood clot or knot in his groin area."  He feels like his leg gives out on him sometimes.  I told him I cannot evaluate that over the phone but it does not sound anything concerning to go for urgent evaluation and it can wait until next week as long as he is comfortable with that plan.  He is worried that his red blood cell count is low, I told him this is chronically the case because he is not taking his HIV medication and it impacted his bone marrow.  He assures me he has been taking all of his medications on discharge and will continue to do so.  He says his family is telling him he looks healthier, he left the hospital 129 pounds which he was happy about.   Rexene Alberts, MSN, NP-C [facility]  [email or web address] Pager: [phone] Office: [phone] [facility] Main Line: [phone] *Secure Chat Communication Welcome

## 2023-08-28 LAB — FUNGAL ORGANISM REFLEX

## 2023-08-28 LAB — FUNGUS CULTURE WITH STAIN

## 2023-08-28 LAB — FUNGUS CULTURE RESULT

## 2023-09-01 ENCOUNTER — Ambulatory Visit: Payer: MEDICAID | Admitting: Infectious Diseases

## 2023-09-05 ENCOUNTER — Telehealth: Payer: Self-pay

## 2023-09-05 ENCOUNTER — Inpatient Hospital Stay: Admitting: Infectious Diseases

## 2023-09-05 ENCOUNTER — Emergency Department (HOSPITAL_COMMUNITY)
Admission: EM | Admit: 2023-09-05 | Discharge: 2023-09-06 | Disposition: A | Discharge status: Other Acute Inpatient Hospital | Payer: MEDICAID | Attending: Emergency Medicine | Admitting: Emergency Medicine

## 2023-09-05 DIAGNOSIS — R441 Visual hallucinations: Secondary | ICD-10-CM

## 2023-09-05 DIAGNOSIS — R45851 Suicidal ideations: Secondary | ICD-10-CM | POA: Insufficient documentation

## 2023-09-05 DIAGNOSIS — Z5900 Homelessness unspecified: Secondary | ICD-10-CM | POA: Insufficient documentation

## 2023-09-05 DIAGNOSIS — Z21 Asymptomatic human immunodeficiency virus [HIV] infection status: Secondary | ICD-10-CM | POA: Diagnosis not present

## 2023-09-05 DIAGNOSIS — A523 Neurosyphilis, unspecified: Secondary | ICD-10-CM | POA: Diagnosis present

## 2023-09-05 DIAGNOSIS — F1910 Other psychoactive substance abuse, uncomplicated: Secondary | ICD-10-CM | POA: Diagnosis present

## 2023-09-05 DIAGNOSIS — R4689 Other symptoms and signs involving appearance and behavior: Secondary | ICD-10-CM | POA: Diagnosis present

## 2023-09-05 DIAGNOSIS — F19951 Other psychoactive substance use, unspecified with psychoactive substance-induced psychotic disorder with hallucinations: Secondary | ICD-10-CM | POA: Insufficient documentation

## 2023-09-05 DIAGNOSIS — R440 Auditory hallucinations: Secondary | ICD-10-CM

## 2023-09-05 LAB — CBC WITH DIFFERENTIAL/PLATELET
Abs Immature Granulocytes: 0 10*3/uL (ref 0.00–0.07)
Basophils Absolute: 0.1 10*3/uL (ref 0.0–0.1)
Basophils Relative: 2 %
Eosinophils Absolute: 0.6 10*3/uL — ABNORMAL HIGH (ref 0.0–0.5)
Eosinophils Relative: 19 %
HCT: 32.9 % — ABNORMAL LOW (ref 39.0–52.0)
Hemoglobin: 11 g/dL — ABNORMAL LOW (ref 13.0–17.0)
Lymphocytes Relative: 37 %
Lymphs Abs: 1.3 10*3/uL (ref 0.7–4.0)
MCH: 35.5 pg — ABNORMAL HIGH (ref 26.0–34.0)
MCHC: 33.4 g/dL (ref 30.0–36.0)
MCV: 106.1 fL — ABNORMAL HIGH (ref 80.0–100.0)
Monocytes Absolute: 0.3 10*3/uL (ref 0.1–1.0)
Monocytes Relative: 9 %
Neutro Abs: 1.1 10*3/uL — ABNORMAL LOW (ref 1.7–7.7)
Neutrophils Relative %: 33 %
Platelets: 256 10*3/uL (ref 150–400)
RBC: 3.1 MIL/uL — ABNORMAL LOW (ref 4.22–5.81)
RDW: 13.2 % (ref 11.5–15.5)
WBC: 3.4 10*3/uL — ABNORMAL LOW (ref 4.0–10.5)
nRBC: 0 % (ref 0.0–0.2)
nRBC: 0 /100{WBCs}

## 2023-09-05 LAB — COMPREHENSIVE METABOLIC PANEL WITH GFR
ALT: 25 U/L (ref 0–44)
AST: 22 U/L (ref 15–41)
Albumin: 3 g/dL — ABNORMAL LOW (ref 3.5–5.0)
Alkaline Phosphatase: 44 U/L (ref 38–126)
Anion gap: 7 (ref 5–15)
BUN: 12 mg/dL (ref 6–20)
CO2: 25 mmol/L (ref 22–32)
Calcium: 9.1 mg/dL (ref 8.9–10.3)
Chloride: 107 mmol/L (ref 98–111)
Creatinine, Ser: 1.07 mg/dL (ref 0.61–1.24)
GFR, Estimated: >60 mL/min (ref >60)
Glucose, Bld: 105 mg/dL — ABNORMAL HIGH (ref 70–99)
Potassium: 3.3 mmol/L — ABNORMAL LOW (ref 3.5–5.1)
Sodium: 139 mmol/L (ref 135–145)
Total Bilirubin: 0.7 mg/dL (ref 0.0–1.2)
Total Protein: 6.5 g/dL (ref 6.5–8.1)

## 2023-09-05 LAB — SALICYLATE LEVEL: Salicylate Lvl: <7 mg/dL — ABNORMAL LOW (ref 7.0–30.0)

## 2023-09-05 LAB — ACETAMINOPHEN LEVEL: Acetaminophen (Tylenol), Serum: <10 ug/mL — ABNORMAL LOW (ref 10–30)

## 2023-09-05 LAB — ETHANOL: Alcohol, Ethyl (B): <10 mg/dL (ref <10)

## 2023-09-05 NOTE — Telephone Encounter (Signed)
 Patient called stating that he felt like he was hallucinating and suicidal.  Patient stated that he doesn't want to be around anyone and that he feels like "blowing his brains out". Patient is not currently around anyone and staying at a hotel alone. Patient denies having any weapons around.   Contacted EMS, gave patient address and information. EMS stated that a police officer and "someone with behavioral health" is on the way to check on patient.    I stayed on the phone with patient until police arrived.    Anetria Harwick Lesli Albee, CMA

## 2023-09-05 NOTE — ED Provider Notes (Incomplete)
 [place] EMERGENCY DEPARTMENT AT [facility] Provider Note   CSN: [id number] Arrival date & time: 09/05/23  1447     History {Add pertinent medical, surgical, social history, OB history to HPI:1} Chief Complaint  Patient presents with  . Suicidal    Lee Bridges is a 33 y.o. male.  HPI   33 year old male presents emergency department with complaints of auditory/visual sedation as well as suicidal ideation.  States he has been having symptoms for the past couple of weeks.  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  States that he had some of his behavioral health medications adjusted at that time with increasing his dose of Seroquel and addition of another medication that he cannot exactly remember.  States that while he is in the hospital, began to see people that were not there as well as hear people talking that were not there.  States that the symptoms have become more regular ever since being discharged over the past week or 2.  States that the voices are telling him to harm himself.  States that earlier today, had a desire to "blow my brains out" or "stab myself."  Patient denies any direct plan.  Denies any access to a firearm.  States that he feels like this is related to his medication that he was started on.  Denies any visual disturbance, gait abnormality, slurred speech, facial droop, weakness with history of reasonable extremities.  Denies any substance use.  States that he does have a history of polysubstance use with most recent use 3 weeks ago.   Past medical history significant for HIV, polysubstance use, psychosis, syphilis, cocaine abuse, GAD, MDD, methamphetamine abuse  Home Medications Prior to Admission medications   Medication Sig Start Date End Date Taking? Authorizing Provider  atovaquone [facility]) 750 MG/5ML suspension Take 10 mLs (1,500 mg total) by mouth daily  with breakfast. 08/23/23 09/22/23  Charise Killian, MD  cyanocobalamin 1000 MCG tablet Take 1 tablet (1,000 mcg total) by mouth daily. 08/23/23 09/22/23  Charise Killian, MD  methocarbamol (ROBAXIN) 500 MG tablet Take 1 tablet (500 mg total) by mouth every 6 (six) hours as needed for muscle spasms. 08/03/23   Jerald Kief, MD  QUEtiapine (SEROQUEL) 100 MG tablet Take 1 tablet (100 mg total) by mouth at bedtime. 08/22/23 09/21/23  Charise Killian, MD  sertraline (ZOLOFT) 50 MG tablet Take 1 tablet (50 mg total) by mouth daily. 08/23/23 09/22/23  Charise Killian, MD  terbinafine (LAMISIL) 1 % cream Apply topically 2 (two) times daily. 08/03/23   Jerald Kief, MD      Allergies    Fish allergy and Shellfish allergy    Review of Systems   Review of Systems  All other systems reviewed and are negative.   Physical Exam Updated Vital Signs BP 100/68 (BP Location: Left Arm)   Pulse 90   Temp 97.9 F (36.6 C)   Resp 16   SpO2 100%  Physical Exam Vitals and nursing note reviewed.  Constitutional:      General: He is not in acute distress.    Appearance: He is well-developed.  HENT:     Head: Normocephalic and atraumatic.  Eyes:     Conjunctiva/sclera: Conjunctivae normal.  Cardiovascular:     Rate and Rhythm: Normal rate and regular rhythm.     Heart sounds: No murmur heard. Pulmonary:     Effort:  Pulmonary effort is normal. No respiratory distress.     Breath sounds: Normal breath sounds. No wheezing, rhonchi or rales.  Abdominal:     Palpations: Abdomen is soft.     Tenderness: There is no abdominal tenderness.  Musculoskeletal:        General: No swelling.     Cervical back: Neck supple.  Skin:    General: Skin is warm and dry.     Capillary Refill: Capillary refill takes less than 2 seconds.  Neurological:     Mental Status: He is alert.  Psychiatric:        Speech: Speech is tangential.     Comments: Patient actively describing individuals walking around the  hallway not physically there.  Patient aware of individuals physically being there.     ED Results / Procedures / Treatments   Labs (all labs ordered are listed, but only abnormal results are displayed) Labs Reviewed  COMPREHENSIVE METABOLIC PANEL WITH GFR - Abnormal; Notable for the following components:      Result Value   Potassium 3.3 (*)    Glucose, Bld 105 (*)    Albumin 3.0 (*)    All other components within normal limits  CBC WITH DIFFERENTIAL/PLATELET - Abnormal; Notable for the following components:   WBC 3.4 (*)    RBC 3.10 (*)    Hemoglobin 11.0 (*)    HCT 32.9 (*)    MCV 106.1 (*)    MCH 35.5 (*)    Neutro Abs 1.1 (*)    Eosinophils Absolute 0.6 (*)    All other components within normal limits  SALICYLATE LEVEL - Abnormal; Notable for the following components:   Salicylate Lvl <7.0 (*)    All other components within normal limits  ACETAMINOPHEN LEVEL - Abnormal; Notable for the following components:   Acetaminophen (Tylenol), Serum <10 (*)    All other components within normal limits  ETHANOL  RAPID URINE DRUG SCREEN, HOSP PERFORMED    EKG None  Radiology No results found.  Procedures Procedures  {Document cardiac monitor, telemetry assessment procedure when appropriate:1}  Medications Ordered in ED Medications - No data to display  ED Course/ Medical Decision Making/ A&P   {   Click here for ABCD2, HEART and other calculatorsREFRESH Note before signing :1}                              Medical Decision Making  This patient presents to the ED for concern of suicidal ideation/auditory/visual hallucinations, this involves an extensive number of treatment options, and is a complaint that carries with it a high risk of complications and morbidity.  The differential diagnosis includes medication ingestion, behavior, infectious etiology, metabolic derangement, other   Co morbidities that complicate the patient evaluation  See HPI   Additional  history obtained:  Additional history obtained from EMR External records from outside source obtained and reviewed including hospital records   Lab Tests:  I Ordered, and personally interpreted labs.  The pertinent results include: Leukopenia 3.4.  And a methemoglobin of 11.  Please have him arrange.  Mild hypokalemia of 3.3 otherwise, electrolytes within limits.  No transaminitis.  No gait dysfunction.  Since late, similar to levels, ethanol levels within normal limits.  UDS pending.   Imaging Studies ordered:  N/a   Cardiac Monitoring: / EKG:  The patient was maintained on a cardiac monitor.  I personally viewed and interpreted the cardiac monitored which  showed an underlying rhythm of: Sinus rhythm with T wave inversion inferior lateral region of which appears to be stable from EKG performed on 07/31/2023   Consultations Obtained:  N/a   Problem List / ED Course / Critical interventions / Medication management  Suicide ideation.  Auditory/visual hallucinations. Reevaluation of the patient  showed that the patient stayed the same I have reviewed the patients home medicines and have made adjustments as needed   Social Determinants of Health:  Polysubstance use.  Homelessness.   Test / Admission - Considered:  Suicide ideation.  Auditory/visual hallucinations. Vitals signs within normal range and stable throughout visit. Laboratory studies significant for: see above 33 year old male presents emergency department with complaints of auditory/visual sedation as well as suicidal ideation.  States he has been having symptoms for the past couple of weeks.  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  States that he had some of his behavioral health medications adjusted at that time with increasing his dose of Seroquel and addition of another medication that he cannot exactly remember.  States that  while he is in the hospital, began to see people that were not there as well as hear people talking that were not there.  States that the symptoms have become more regular ever since being discharged over the past week or 2.  States that the voices are telling him to harm himself.  States that earlier today, had a desire to "blow my brains out" or "stab myself."  Patient denies any direct plan.  Denies any access to a firearm.  States that he feels like this is related to his medication that he was started on.  Denies any visual disturbance, gait abnormality, slurred speech, facial droop, weakness with history of reasonable extremities.  Denies any substance use.  States that he does have a history of polysubstance use with most recent use 3 weeks ago.  *** Worrisome signs and symptoms were discussed with the patient, and the patient acknowledged understanding to return to the ED if noticed. Patient was stable upon discharge.    {Document critical care time when appropriate:1} {Document review of labs and clinical decision tools ie heart score, Chads2Vasc2 etc:1}  {Document your independent review of radiology images, and any outside records:1} {Document your discussion with family members, caretakers, and with consultants:1} {Document social determinants of health affecting pt's care:1} {Document your decision making why or why not admission, treatments were needed:1} Final Clinical Impression(s) / ED Diagnoses Final diagnoses:  None    Rx / DC Orders ED Discharge Orders     None

## 2023-09-05 NOTE — ED Provider Notes (Signed)
 [place] EMERGENCY DEPARTMENT AT [facility] Provider Note   CSN: [id number] Arrival date & time: 09/05/23  1447     History  Chief Complaint  Patient presents with   Suicidal    Lee Bridges is a 33 y.o. male.  HPI   33 year old male presents emergency department with complaints of auditory/visual sedation as well as suicidal ideation.  States he has been having symptoms for the past couple of weeks.  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  States that he had some of his behavioral health medications adjusted at that time with increasing his dose of Seroquel and addition of another medication that he cannot exactly remember.  States that while he is in the hospital, began to see people that were not there as well as hear people talking that were not there.  States that the symptoms have become more regular ever since being discharged over the past week or 2.  States that the voices are telling him to harm himself.  States that earlier today, had a desire to "blow my brains out" or "stab myself."  Patient denies any direct plan.  Denies any access to a firearm.  States that he feels like this is related to his medication that he was started on.  Denies any visual disturbance, gait abnormality, slurred speech, facial droop, weakness with history of reasonable extremities.  Denies any substance use.  States that he does have a history of polysubstance use with most recent use 3 weeks ago.   Past medical history significant for HIV, polysubstance use, psychosis, syphilis, cocaine abuse, GAD, MDD, methamphetamine abuse  Home Medications Prior to Admission medications   Medication Sig Start Date End Date Taking? Authorizing Provider  atovaquone [facility]) 750 MG/5ML suspension Take 10 mLs (1,500 mg total) by mouth daily with breakfast. 08/23/23 09/22/23  Charise Killian, MD  cyanocobalamin  1000 MCG tablet Take 1 tablet (1,000 mcg total) by mouth daily. 08/23/23 09/22/23  Charise Killian, MD  methocarbamol (ROBAXIN) 500 MG tablet Take 1 tablet (500 mg total) by mouth every 6 (six) hours as needed for muscle spasms. 08/03/23   Jerald Kief, MD  QUEtiapine (SEROQUEL) 100 MG tablet Take 1 tablet (100 mg total) by mouth at bedtime. 08/22/23 09/21/23  Charise Killian, MD  sertraline (ZOLOFT) 50 MG tablet Take 1 tablet (50 mg total) by mouth daily. 08/23/23 09/22/23  Charise Killian, MD  terbinafine (LAMISIL) 1 % cream Apply topically 2 (two) times daily. 08/03/23   Jerald Kief, MD      Allergies    Fish allergy and Shellfish allergy    Review of Systems   Review of Systems  All other systems reviewed and are negative.   Physical Exam Updated Vital Signs BP 100/68 (BP Location: Left Arm)   Pulse 90   Temp 97.9 F (36.6 C)   Resp 16   SpO2 100%  Physical Exam Vitals and nursing note reviewed.  Constitutional:      General: He is not in acute distress.    Appearance: He is well-developed.  HENT:     Head: Normocephalic and atraumatic.  Eyes:     Conjunctiva/sclera: Conjunctivae normal.  Cardiovascular:     Rate and Rhythm: Normal rate and regular rhythm.     Heart sounds: No murmur heard. Pulmonary:     Effort: Pulmonary effort is normal. No respiratory distress.  Breath sounds: Normal breath sounds. No wheezing, rhonchi or rales.  Abdominal:     Palpations: Abdomen is soft.     Tenderness: There is no abdominal tenderness.  Musculoskeletal:        General: No swelling.     Cervical back: Neck supple.  Skin:    General: Skin is warm and dry.     Capillary Refill: Capillary refill takes less than 2 seconds.  Neurological:     Mental Status: He is alert.  Psychiatric:        Speech: Speech is tangential.     Comments: Patient actively describing individuals walking around the hallway not physically there.  Patient aware of individuals physically  being there.     ED Results / Procedures / Treatments   Labs (all labs ordered are listed, but only abnormal results are displayed) Labs Reviewed  COMPREHENSIVE METABOLIC PANEL WITH GFR - Abnormal; Notable for the following components:      Result Value   Potassium 3.3 (*)    Glucose, Bld 105 (*)    Albumin 3.0 (*)    All other components within normal limits  CBC WITH DIFFERENTIAL/PLATELET - Abnormal; Notable for the following components:   WBC 3.4 (*)    RBC 3.10 (*)    Hemoglobin 11.0 (*)    HCT 32.9 (*)    MCV 106.1 (*)    MCH 35.5 (*)    Neutro Abs 1.1 (*)    Eosinophils Absolute 0.6 (*)    All other components within normal limits  SALICYLATE LEVEL - Abnormal; Notable for the following components:   Salicylate Lvl <7.0 (*)    All other components within normal limits  ACETAMINOPHEN LEVEL - Abnormal; Notable for the following components:   Acetaminophen (Tylenol), Serum <10 (*)    All other components within normal limits  ETHANOL  RAPID URINE DRUG SCREEN, HOSP PERFORMED    EKG None  Radiology No results found.  Procedures Procedures    Medications Ordered in ED Medications - No data to display  ED Course/ Medical Decision Making/ A&P                                 Medical Decision Making  This patient presents to the ED for concern of suicidal ideation/auditory/visual hallucinations, this involves an extensive number of treatment options, and is a complaint that carries with it a high risk of complications and morbidity.  The differential diagnosis includes medication ingestion, behavior, infectious etiology, metabolic derangement, other   Co morbidities that complicate the patient evaluation  See HPI   Additional history obtained:  Additional history obtained from EMR External records from outside source obtained and reviewed including hospital records   Lab Tests:  I Ordered, and personally interpreted labs.  The pertinent results include:  Leukopenia 3.4.  And a methemoglobin of 11.  Please have him arrange.  Mild hypokalemia of 3.3 otherwise, electrolytes within limits.  No transaminitis.  No gait dysfunction.  Since late, similar to levels, ethanol levels within normal limits.  UDS pending.   Imaging Studies ordered:  N/a   Cardiac Monitoring: / EKG:  The patient was maintained on a cardiac monitor.  I personally viewed and interpreted the cardiac monitored which showed an underlying rhythm of: Sinus rhythm with T wave inversion inferior lateral region of which appears to be stable from EKG performed on 07/31/2023   Consultations Obtained:  N/a  Problem List / ED Course / Critical interventions / Medication management  Suicide ideation.  Auditory/visual hallucinations. Reevaluation of the patient  showed that the patient stayed the same I have reviewed the patients home medicines and have made adjustments as needed   Social Determinants of Health:  Polysubstance use.  Homelessness.   Test / Admission - Considered:  Suicide ideation.  Auditory/visual hallucinations. Vitals signs within normal range and stable throughout visit. Laboratory studies significant for: see above 33 year old male presents emergency department with complaints of auditory/visual sedation as well as suicidal ideation.  States he has been having symptoms for the past couple of weeks.  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  States that he had some of his behavioral health medications adjusted at that time with increasing his dose of Seroquel and addition of another medication that he cannot exactly remember.  States that while he is in the hospital, began to see people that were not there as well as hear people talking that were not there.  States that the symptoms have become more regular ever since being discharged over the past week or 2.  States that  the voices are telling him to harm himself.  States that earlier today, had a desire to "blow my brains out" or "stab myself."  Patient denies any direct plan.  Denies any access to a firearm.  States that he feels like this is related to his medication that he was started on.  Denies any visual disturbance, gait abnormality, slurred speech, facial droop, weakness with history of reasonable extremities.  Denies any substance use.  States that he does have a history of polysubstance use with most recent use 3 weeks ago.  On exam, patient with somewhat tangential speech.  Describing visual examinations of individuals walking the hallway without a physically later.  Patient aware of hallucinations.  Labs unremarkable for any acute process.  Symptoms seem to coincide with the medication begun, prior admission which seems to be beginning of Zoloft and increasing dose of Seroquel.  Suspect medication does cause given no clinical evidence of infectious etiology.  Patient currently declining substance use. Will consult TTS for further assessment regarding patient's symptoms. Patient stable upon consultation         Final Clinical Impression(s) / ED Diagnoses Final diagnoses:  Suicidal ideation  Visual hallucinations  Auditory hallucinations    Rx / DC Orders ED Discharge Orders     None         Peter Garter, [place] 09/06/23 0010    Sabas Sous, MD 09/06/23 [phone]

## 2023-09-05 NOTE — ED Triage Notes (Signed)
 Pt c/o suicidal thoughts ongoing. Pt reports plan to hang himself or cut his wrists. He also endorses visual and command auditory hallucinations. Pt states he was recently treated for neurosyphillis and told he was needing inpatient psych "but a nurse told me what to say to get me out of there".

## 2023-09-05 NOTE — ED Provider Triage Note (Signed)
 Emergency Medicine Provider Triage Evaluation Note  Lee Bridges , a 33 y.o. male  was evaluated in triage.  Pt complains of auditory and visual hallucinations and SI for the past 2 weeks.  He is seeing people and hearing voices that tell him to kill himself.  He reports that if he did kill himself he would "jump off a bridge", slice his throat.  PMHx of HIV, cocaine abuse, AIDS, neurosyphilis, polysubstance abuse, MDD.  Review of Systems  Positive: Hallucinations, HI Negative:   Physical Exam  BP 117/75 (BP Location: Left Arm)   Pulse 71   Temp 98.5 F (36.9 C)   Resp 18   SpO2 99%  Gen:   Awake, no distress   Resp:  Normal effort  MSK:   Moves extremities without difficulty  Other:    Medical Decision Making  Medically screening exam initiated at 3:41 PM.  Appropriate orders placed.  Virgilio Frees was informed that the remainder of the evaluation will be completed by another provider, this initial triage assessment does not replace that evaluation, and the importance of remaining in the ED until their evaluation is complete.  Med clearance labs ordered.  Suicide precautions.   Judithann Sheen, PA 09/05/23 1544

## 2023-09-06 ENCOUNTER — Encounter (HOSPITAL_COMMUNITY): Payer: Self-pay

## 2023-09-06 ENCOUNTER — Encounter (HOSPITAL_COMMUNITY): Payer: Self-pay | Admitting: Nurse Practitioner

## 2023-09-06 ENCOUNTER — Other Ambulatory Visit: Payer: Self-pay

## 2023-09-06 ENCOUNTER — Inpatient Hospital Stay (HOSPITAL_COMMUNITY)
Admission: AD | Admit: 2023-09-06 | Discharge: 2023-09-20 | DRG: 897 | Disposition: A | Discharge status: Home / Self Care | Payer: MEDICAID | Source: Intra-hospital | Attending: Psychiatry | Admitting: Psychiatry

## 2023-09-06 DIAGNOSIS — Z8261 Family history of arthritis: Secondary | ICD-10-CM | POA: Diagnosis not present

## 2023-09-06 DIAGNOSIS — A523 Neurosyphilis, unspecified: Secondary | ICD-10-CM | POA: Diagnosis present

## 2023-09-06 DIAGNOSIS — F411 Generalized anxiety disorder: Secondary | ICD-10-CM | POA: Diagnosis present

## 2023-09-06 DIAGNOSIS — F19959 Other psychoactive substance use, unspecified with psychoactive substance-induced psychotic disorder, unspecified: Secondary | ICD-10-CM | POA: Diagnosis not present

## 2023-09-06 DIAGNOSIS — Z79899 Other long term (current) drug therapy: Secondary | ICD-10-CM

## 2023-09-06 DIAGNOSIS — Z8619 Personal history of other infectious and parasitic diseases: Secondary | ICD-10-CM

## 2023-09-06 DIAGNOSIS — F609 Personality disorder, unspecified: Secondary | ICD-10-CM | POA: Diagnosis present

## 2023-09-06 DIAGNOSIS — F17210 Nicotine dependence, cigarettes, uncomplicated: Secondary | ICD-10-CM | POA: Diagnosis present

## 2023-09-06 DIAGNOSIS — Z833 Family history of diabetes mellitus: Secondary | ICD-10-CM | POA: Diagnosis not present

## 2023-09-06 DIAGNOSIS — Z91148 Patient's other noncompliance with medication regimen for other reason: Secondary | ICD-10-CM | POA: Diagnosis not present

## 2023-09-06 DIAGNOSIS — F1210 Cannabis abuse, uncomplicated: Secondary | ICD-10-CM | POA: Diagnosis present

## 2023-09-06 DIAGNOSIS — Z9151 Personal history of suicidal behavior: Secondary | ICD-10-CM

## 2023-09-06 DIAGNOSIS — Z888 Allergy status to other drugs, medicaments and biological substances status: Secondary | ICD-10-CM

## 2023-09-06 DIAGNOSIS — F331 Major depressive disorder, recurrent, moderate: Secondary | ICD-10-CM | POA: Diagnosis present

## 2023-09-06 DIAGNOSIS — Z5982 Transportation insecurity: Secondary | ICD-10-CM | POA: Diagnosis not present

## 2023-09-06 DIAGNOSIS — F19951 Other psychoactive substance use, unspecified with psychoactive substance-induced psychotic disorder with hallucinations: Secondary | ICD-10-CM | POA: Diagnosis not present

## 2023-09-06 DIAGNOSIS — Z8249 Family history of ischemic heart disease and other diseases of the circulatory system: Secondary | ICD-10-CM | POA: Diagnosis not present

## 2023-09-06 DIAGNOSIS — Z5941 Food insecurity: Secondary | ICD-10-CM | POA: Diagnosis not present

## 2023-09-06 DIAGNOSIS — Z5948 Other specified lack of adequate food: Secondary | ICD-10-CM | POA: Diagnosis not present

## 2023-09-06 DIAGNOSIS — B20 Human immunodeficiency virus [HIV] disease: Secondary | ICD-10-CM | POA: Diagnosis present

## 2023-09-06 DIAGNOSIS — Z608 Other problems related to social environment: Secondary | ICD-10-CM | POA: Diagnosis present

## 2023-09-06 DIAGNOSIS — Z5900 Homelessness unspecified: Secondary | ICD-10-CM | POA: Diagnosis not present

## 2023-09-06 DIAGNOSIS — F15159 Other stimulant abuse with stimulant-induced psychotic disorder, unspecified: Secondary | ICD-10-CM | POA: Diagnosis present

## 2023-09-06 DIAGNOSIS — Z716 Tobacco abuse counseling: Secondary | ICD-10-CM

## 2023-09-06 DIAGNOSIS — Z604 Social exclusion and rejection: Secondary | ICD-10-CM | POA: Diagnosis present

## 2023-09-06 DIAGNOSIS — R45851 Suicidal ideations: Secondary | ICD-10-CM | POA: Diagnosis present

## 2023-09-06 DIAGNOSIS — T50906A Underdosing of unspecified drugs, medicaments and biological substances, initial encounter: Secondary | ICD-10-CM | POA: Diagnosis present

## 2023-09-06 DIAGNOSIS — F1420 Cocaine dependence, uncomplicated: Secondary | ICD-10-CM | POA: Diagnosis present

## 2023-09-06 DIAGNOSIS — Z91013 Allergy to seafood: Secondary | ICD-10-CM

## 2023-09-06 DIAGNOSIS — Z765 Malingerer [conscious simulation]: Secondary | ICD-10-CM

## 2023-09-06 DIAGNOSIS — Z79624 Long term (current) use of inhibitors of nucleotide synthesis: Secondary | ICD-10-CM

## 2023-09-06 DIAGNOSIS — Z560 Unemployment, unspecified: Secondary | ICD-10-CM

## 2023-09-06 LAB — RAPID URINE DRUG SCREEN, HOSP PERFORMED
Amphetamines: NOT DETECTED
Barbiturates: NOT DETECTED
Benzodiazepines: NOT DETECTED
Cocaine: POSITIVE — AB
Opiates: NOT DETECTED
Tetrahydrocannabinol: POSITIVE — AB

## 2023-09-06 MED ORDER — HALOPERIDOL LACTATE 5 MG/ML IJ SOLN: 10.0000 mg | Freq: Three times a day (TID) | INTRAMUSCULAR | Status: DC | PRN

## 2023-09-06 MED ORDER — HYDROXYZINE HCL 25 MG PO TABS
25.0000 mg | ORAL_TABLET | Freq: Three times a day (TID) | ORAL | Status: DC | PRN
  Administered 2023-09-11 – 2023-09-16 (×5): 25 mg via ORAL
  Filled 2023-09-06 (×5): qty 1

## 2023-09-06 MED ORDER — MAGNESIUM HYDROXIDE 400 MG/5ML PO SUSP: 30.0000 mL | Freq: Every day | ORAL | Status: DC | PRN

## 2023-09-06 MED ORDER — VORTIOXETINE HBR 5 MG PO TABS
5.0000 mg | ORAL_TABLET | Freq: Every day | ORAL | Status: DC
Start: 2023-09-07 — End: 2023-09-08
  Administered 2023-09-07 – 2023-09-08 (×2): 5 mg via ORAL
  Filled 2023-09-06 (×3): qty 1

## 2023-09-06 MED ORDER — DIPHENHYDRAMINE HCL 50 MG/ML IJ SOLN: 50.0000 mg | Freq: Three times a day (TID) | INTRAMUSCULAR | Status: DC | PRN

## 2023-09-06 MED ORDER — VORTIOXETINE HBR 5 MG PO TABS
5.0000 mg | ORAL_TABLET | Freq: Every day | ORAL | Status: DC
  Administered 2023-09-06: 5 mg via ORAL
  Filled 2023-09-06: qty 1

## 2023-09-06 MED ORDER — QUETIAPINE FUMARATE ER 50 MG PO TB24
100.0000 mg | ORAL_TABLET | Freq: Every day | ORAL | Status: DC
  Administered 2023-09-06: 100 mg via ORAL
  Filled 2023-09-06 (×4): qty 2

## 2023-09-06 MED ORDER — ALUM & MAG HYDROXIDE-SIMETH 200-200-20 MG/5ML PO SUSP
30.0000 mL | ORAL | Status: DC | PRN
  Administered 2023-09-09: 30 mL via ORAL
  Filled 2023-09-06: qty 30

## 2023-09-06 MED ORDER — QUETIAPINE FUMARATE 25 MG PO TABS
50.0000 mg | ORAL_TABLET | Freq: Once | ORAL | Status: AC
  Administered 2023-09-06: 50 mg via ORAL
  Filled 2023-09-06: qty 2

## 2023-09-06 MED ORDER — ACETAMINOPHEN 325 MG PO TABS
650.0000 mg | ORAL_TABLET | Freq: Four times a day (QID) | ORAL | Status: DC | PRN
  Administered 2023-09-08 – 2023-09-19 (×5): 650 mg via ORAL
  Filled 2023-09-06 (×5): qty 2

## 2023-09-06 MED ORDER — NICOTINE 14 MG/24HR TD PT24
14.0000 mg | MEDICATED_PATCH | Freq: Every day | TRANSDERMAL | Status: DC
Start: 2023-09-06 — End: 2023-09-20
  Administered 2023-09-06 – 2023-09-19 (×11): 14 mg via TRANSDERMAL
  Filled 2023-09-06 (×16): qty 1

## 2023-09-06 MED ORDER — HALOPERIDOL 5 MG PO TABS
5.0000 mg | ORAL_TABLET | Freq: Three times a day (TID) | ORAL | Status: DC | PRN
  Administered 2023-09-06 – 2023-09-17 (×2): 5 mg via ORAL
  Filled 2023-09-06 (×2): qty 1

## 2023-09-06 MED ORDER — QUETIAPINE FUMARATE ER 50 MG PO TB24: 100.0000 mg | ORAL_TABLET | Freq: Every day | ORAL | Status: DC

## 2023-09-06 MED ORDER — HALOPERIDOL LACTATE 5 MG/ML IJ SOLN: 5.0000 mg | Freq: Three times a day (TID) | INTRAMUSCULAR | Status: DC | PRN

## 2023-09-06 MED ORDER — HYDROXYZINE HCL 25 MG PO TABS
25.0000 mg | ORAL_TABLET | Freq: Three times a day (TID) | ORAL | Status: DC | PRN
Start: 2023-09-06 — End: 2023-09-06
  Filled 2023-09-06 (×2): qty 1

## 2023-09-06 MED ORDER — LORAZEPAM 2 MG/ML IJ SOLN: 2.0000 mg | Freq: Three times a day (TID) | INTRAMUSCULAR | Status: DC | PRN

## 2023-09-06 MED ORDER — DIPHENHYDRAMINE HCL 25 MG PO CAPS
50.0000 mg | ORAL_CAPSULE | Freq: Three times a day (TID) | ORAL | Status: DC | PRN
  Administered 2023-09-10 – 2023-09-17 (×2): 50 mg via ORAL
  Filled 2023-09-06 (×2): qty 2

## 2023-09-06 NOTE — ED Notes (Addendum)
 Pt belongings:  Lee Bridges Black net bag with clothing inside [employer] T shirt Jeans Body spray Belongings placed in locker #2 in [place]

## 2023-09-06 NOTE — Consult Note (Signed)
 [facility] Psychiatric Consult Initial  Patient Name: .Mousa Prout  MRN: [id number]  DOB: [birth year]  Consult Order details:  Orders (From admission, onward)     Start     Ordered   09/05/23 2334  CONSULT TO CALL ACT TEAM       Ordering Provider: Peter Garter, PA  Provider:  (Not yet assigned)  Question:  Reason for Consult?  Answer:  Psych consult   09/05/23 2333             Mode of Visit: In person    Psychiatry Consult Evaluation  Service Date: September 06, 2023 LOS:  LOS: 0 days  Chief Complaint auditory and visual hallucinations  Primary Psychiatric Diagnoses  Substance induced psychotic disorder with hallucinations 2.  Polysubstance abuse 3.  neurosyphilis  Assessment  Derion Kreiter is a 33 y.o. male admitted: Presented to the [facility] 09/05/2023  2:58 PM for worsening AVH.   His current presentation of AVH is most consistent with substance induced psychotic disorder. He meets criteria for inpatient based on command AVH and medication changes.  Current outpatient psychotropic medications include zoloft and seroquel and historically he has had a negative response to these medications. He was not compliant with medications prior to admission as evidenced by patient report. On initial examination, patient is pleasant, reports current AVH with commands to kill himself. Please see plan below for detailed recommendations.   Diagnoses:  Active Hospital problems: Principal Problem:   Substance-induced psychotic disorder with hallucinations (HCC) Active Problems:   Neurosyphilis   Polysubstance abuse (HCC)    Plan   ## Psychiatric Medication Recommendations:  - Pt stated since starting Zoloft he has been experiencing AVH, with illicit substances worsening the AVH.  He reports previously taking Trintillex which is the only antidepressant he feels like has worked well for him in the past. Will restart Trintillex at 5 mg daily.  - Change Seroquel 100 mg at bedtime to  --> Seroquel XR 100 mg at bedtime   ## Medical Decision Making Capacity: Not specifically addressed in this encounter  ## Further Work-up:  EKG pending   ## Disposition:-- We recommend inpatient psychiatric hospitalization when medically cleared. Patient is under voluntary admission status at this time; please IVC if attempts to leave hospital.  ## Behavioral / Environmental: - No specific recommendations at this time.     ## Safety and Observation Level:  - Based on my clinical evaluation, I estimate the patient to be at low risk of self harm in the current setting. - At this time, we recommend  routine. This decision is based on my review of the chart including patient's history and current presentation, interview of the patient, mental status examination, and consideration of suicide risk including evaluating suicidal ideation, plan, intent, suicidal or self-harm behaviors, risk factors, and protective factors. This judgment is based on our ability to directly address suicide risk, implement suicide prevention strategies, and develop a safety plan while the patient is in the clinical setting. Please contact our team if there is a concern that risk level has changed.  CSSR Risk Category:C-SSRS RISK CATEGORY: High Risk  Suicide Risk Assessment: Patient has following modifiable risk factors for suicide: recklessness and medication noncompliance, which we are addressing by inpatient treatment. Patient has following non-modifiable or demographic risk factors for suicide: male gender Patient has the following protective factors against suicide: Access to outpatient mental health care, Supportive family, Supportive friends, and Cultural, spiritual, or religious beliefs that discourage suicide  Thank you for this consult request. Recommendations have been communicated to the primary team.  We will recommend inpatient admission at this time.   Eligha Bridegroom, NP       History of Present  Illness  Relevant Aspects of Hospital ED Course:  33 year old male presents emergency department with complaints of auditory/visual sedation as well as suicidal ideation.  States he has been having symptoms for the past couple of weeks.  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  States that he had some of his behavioral health medications adjusted at that time with increasing his dose of Seroquel and addition of another medication that he cannot exactly remember.  States that while he is in the hospital, began to see people that were not there as well as hear people talking that were not there.  States that the symptoms have become more regular ever since being discharged over the past week or 2.  States that the voices are telling him to harm himself.  States that earlier today, had a desire to "blow my brains out" or "stab myself."  Patient denies any direct plan.  Denies any access to a firearm.  States that he feels like this is related to his medication that he was started on.  Denies any visual disturbance, gait abnormality, slurred speech, facial droop, weakness with history of reasonable extremities.  Denies any substance use.  States that he does have a history of polysubstance use with most recent use 3 weeks ago.    Past medical history significant for HIV, polysubstance use, psychosis, syphilis, cocaine abuse, GAD, MDD, methamphetamine abuse  Patient Report:  Pt seen at [facility] for face to face psychiatric evaluation. Pt states since release from hospital where he was admitted for Neurosyphilis in the beginning of March, he has been experiencing AVH. He thinks since Zoloft was initiated he has been experiencing AVH hallucinations that have progressed to daily frequency and command AH. The command AH happen daily and tell him different ways to harm himself. PT does admit to cocaine and THC use, as well as  medication noncompliance with seroquel, which likely are cause for worsening AVH. Pt stated he had been taking his Seroquel as a PRN medication, in which I informed him it is suppose to be scheduled, and daily. He was unaware of this. Also explained he needs to discontinue substance abuse immediately to assist with AVH.   Pt does not express desire to kill himself, however he stated the CAH to harm himself are stressful and has been more difficult to "block out." He does endorse VH of "random people." He denies HI. He does report "okay" sleep and appetite. Pt is wanting to return to inpatient treatment for medication adjustments and stabilization, as he does not feel safe discharging at this time.   Pt does not want to take Zoloft and would like to discontinue. He stated he previously took Trintillex which worked well for him in the past and would like to restart. Pt does like his Seroquel, but has not been taking it consistently. He also feels like it is "not enough coverage for the day time." Explained we will change his Seroquel to extended release for further coverage of AVH.  Pt is agreeable with medication changes and plan for inpatient psychiatric treatment. Pt being reviewed by [facility].    Review of Systems  Psychiatric/Behavioral:  Positive for depression, hallucinations and substance  abuse.   All other systems reviewed and are negative.    Psychiatric and Social History  Psychiatric History:  Information collected from patient, chart  Prev Dx/Sx: mdd Current Psych Provider: denies Home Meds (current): zoloft, seroquel Previous Med Trials: Trintillex Therapy: denies  Prior Psych Hospitalization: yes  Prior Self Harm: yes   Access to weapons/lethal means: denies   Substance History Alcohol: denies  Tobacco: occasional Illicit drugs: THC, cocaine Prescription drug abuse: denies    Vital Signs:  Temp:  [97.7 F (36.5 C)-98.5 F (36.9 C)] 97.7 F (36.5 C) (04/08  0919) Pulse Rate:  [71-90] 81 (04/08 0919) Resp:  [16-18] 18 (04/08 0919) BP: (100-136)/(68-82) 136/82 (04/08 0919) SpO2:  [98 %-100 %] 98 % (04/08 0919) Weight:  [53.1 kg] 53.1 kg (04/08 0351) Blood pressure 136/82, pulse 81, temperature 97.7 F (36.5 C), temperature source Oral, resp. rate 18, height 5\' 2"  (1.575 m), weight 53.1 kg, SpO2 98%. Body mass index is 21.4 kg/m.  Physical Exam Vitals reviewed.  Neurological:     Mental Status: He is alert and oriented to person, place, and time.     Mental Status Exam: General Appearance: Fairly Groomed  Orientation:  Full (Time, Place, and Person)  Memory:  Immediate;   Good Recent;   Good  Concentration:  Concentration: Fair  Recall:  Fair  Attention  Fair  Eye Contact:  Good  Speech:  Clear and Coherent  Language:  Good  Volume:  Normal  Mood: "not great"  Affect:  Congruent  Thought Process:  Goal Directed  Thought Content:  Hallucinations: Auditory Command:  telling him to kill himself Visual  Suicidal Thoughts:  No  Homicidal Thoughts:  No  Judgement:  Fair  Insight:  Fair  Psychomotor Activity:  Normal  Akathisia:  No  Fund of Knowledge:  Fair      Assets:  Manufacturing systems engineer Desire for Improvement Housing Resilience Social Support  Cognition:  WNL  ADL's:  Intact  AIMS (if indicated):        Other History   These have been pulled in through the EMR, reviewed, and updated if appropriate.  Family History:  The patient's family history includes Arthritis in his mother; Diabetes in his father; Hypertension in his father.  Medical History: Past Medical History:  Diagnosis Date   Depression    HIV (human immunodeficiency virus infection) (HCC)    Psychosis (HCC)    Substance abuse (HCC)     Surgical History: History reviewed. No pertinent surgical history.   Medications:   Current Facility-Administered Medications:    hydrOXYzine (ATARAX) tablet 25 mg, 25 mg, Oral, TID PRN, Sherian Maroon A,  PA   QUEtiapine (SEROQUEL XR) 24 hr tablet 100 mg, 100 mg, Oral, QHS, Earvin Blazier, Glynda Jaeger, NP   vortioxetine HBr (TRINTELLIX) tablet 5 mg, 5 mg, Oral, Daily, Eligha Bridegroom, NP  Current Outpatient Medications:    atovaquone (MEPRON) 750 MG/5ML suspension, Take 10 mLs (1,500 mg total) by mouth daily with breakfast., Disp: 300 mL, Rfl: 0   methocarbamol (ROBAXIN) 500 MG tablet, Take 1 tablet (500 mg total) by mouth every 6 (six) hours as needed for muscle spasms., Disp: 20 tablet, Rfl: 0   QUEtiapine (SEROQUEL) 100 MG tablet, Take 1 tablet (100 mg total) by mouth at bedtime., Disp: 30 tablet, Rfl: 0   sertraline (ZOLOFT) 50 MG tablet, Take 1 tablet (50 mg total) by mouth daily., Disp: 30 tablet, Rfl: 0  Allergies: Allergies  Allergen Reactions   Fish Allergy Hives  and Nausea And Vomiting   Shellfish Allergy Hives and Nausea And Vomiting    Eligha Bridegroom, NP

## 2023-09-06 NOTE — BH Assessment (Signed)
@  7829, Patient was deferred to [facility] for a telepsych assessment. The assigned care coordinator will provide updates regarding the scheduling of the assessment. [facility] care coordinator can be reached at [phone] for further information on the timing of the telepsych evaluation.

## 2023-09-06 NOTE — Progress Notes (Signed)
 Pt has been accepted to [facility] on 09/06/2023 Bed assignment: 305-1   Pt meets inpatient criteria per: Eligha Bridegroom NP  Attending Physician will be: Dr. Cherie Dark MD   Report can be called to: Adult unit: [phone]  Pt can arrive after [facility]  Care Team Notified: [facility] Rona Ravens RN, [facility] RN, Eligha Bridegroom NP    [place] Karee Christopherson LCSW-A   09/06/2023 12:03 PM

## 2023-09-06 NOTE — Progress Notes (Signed)
 Patient oriented to unit, reviewed unit schedule and rule, patient verbalized understanding.    09/06/23 1700  Psych Admission Type (Psych Patients Only)  Admission Status Voluntary  Psychosocial Assessment  Patient Complaints Substance abuse;Depression  Eye Contact Fair  Facial Expression Animated  Affect Anxious  Speech Logical/coherent  Interaction Assertive  Motor Activity Other (Comment) (WNL)  Appearance/Hygiene In scrubs  Behavior Characteristics Calm;Cooperative  Mood Anxious  Thought Process  Coherency WDL  Content WDL  Delusions None reported or observed  Perception WDL  Hallucination None reported or observed  Judgment Impaired  Confusion None  Danger to Self  Current suicidal ideation? Passive  Description of Suicide Plan none  Agreement Not to Harm Self Yes  Description of Agreement verbal

## 2023-09-06 NOTE — ED Notes (Signed)
 Called [facility] For PT Pick

## 2023-09-06 NOTE — Plan of Care (Signed)
   Problem: Education: Goal: Knowledge of [facility] Education information/materials will improve Outcome: Progressing Goal: Emotional status will improve Outcome: Progressing Goal: Mental status will improve Outcome: Progressing Goal: Verbalization of understanding the information provided will improve Outcome: Progressing

## 2023-09-07 ENCOUNTER — Encounter (HOSPITAL_COMMUNITY): Payer: Self-pay

## 2023-09-07 MED ORDER — SULFAMETHOXAZOLE-TRIMETHOPRIM 400-80 MG PO TABS
1.0000 | ORAL_TABLET | Freq: Every day | ORAL | Status: DC
  Administered 2023-09-07: 1 via ORAL
  Filled 2023-09-07 (×3): qty 1

## 2023-09-07 MED ORDER — QUETIAPINE FUMARATE ER 200 MG PO TB24
200.0000 mg | ORAL_TABLET | Freq: Every day | ORAL | Status: DC
  Administered 2023-09-07 – 2023-09-08 (×2): 200 mg via ORAL
  Filled 2023-09-07 (×4): qty 1

## 2023-09-07 MED ORDER — ATOVAQUONE 750 MG/5ML PO SUSP
1500.0000 mg | Freq: Every day | ORAL | Status: DC
  Administered 2023-09-08 – 2023-09-20 (×12): 1500 mg via ORAL
  Filled 2023-09-07 (×14): qty 10

## 2023-09-07 MED ORDER — BICTEGRAVIR-EMTRICITAB-TENOFOV 50-200-25 MG PO TABS
1.0000 | ORAL_TABLET | Freq: Every day | ORAL | Status: DC
  Administered 2023-09-07 – 2023-09-20 (×14): 1 via ORAL
  Filled 2023-09-07 (×16): qty 1

## 2023-09-07 NOTE — Group Note (Signed)
 Recreation Therapy Group Note   Group Topic:Team Building  Group Date: 09/07/2023 Start Time: 0935 End Time: 1000 Facilitators: Aaryav Hopfensperger-McCall, LRT,CTRS Location: 300 Hall Dayroom   Group Topic: Communication, Team Building, Problem Solving  Goal Area(s) Addresses:  Patient will effectively work with peer towards shared goal.  Patient will identify skills used to make activity successful.  Patient will identify how skills used during activity can be used to reach post d/c goals.   Intervention: STEM Activity  Activity: Straw Bridge. In teams of 3-5, patients were given 15 plastic drinking straws and an equal length of masking tape. Using the materials provided, patients were instructed to build a free standing bridge-like structure to suspend an everyday item (ex: puzzle box) off of the floor or table surface. All materials were required to be used by the team in their design. LRT facilitated post-activity discussion reviewing team process. Patients were encouraged to reflect how the skills used in this activity can be generalized to daily life post discharge.   Education: Pharmacist, community, Scientist, physiological, Discharge Planning   Education Outcome: Acknowledges education/In group clarification offered/Needs additional education.    Affect/Mood: N/A   Participation Level: Did not attend    Clinical Observations/Individualized Feedback:      Plan: Continue to engage patient in RT group sessions 2-3x/week.   Ceaira Ernster-McCall, LRT,CTRS 09/07/2023 12:24 PM

## 2023-09-07 NOTE — BH IP Treatment Plan (Signed)
 Interdisciplinary Treatment and Diagnostic Plan Update  09/07/2023 Time of Session: 1100am Lee Bridges MRN: [id number]  Principal Diagnosis: Substance-induced psychotic disorder [facility])  Secondary Diagnoses: Principal Problem:   Substance-induced psychotic disorder (HCC)   Current Medications:  Current Facility-Administered Medications  Medication Dose Route Frequency Provider Last Rate Last Admin   acetaminophen (TYLENOL) tablet 650 mg  650 mg Oral Q6H PRN Eligha Bridegroom, NP       alum & mag hydroxide-simeth (MAALOX/MYLANTA) 200-200-20 MG/5ML suspension 30 mL  30 mL Oral Q4H PRN Eligha Bridegroom, NP       haloperidol (HALDOL) tablet 5 mg  5 mg Oral TID PRN Eligha Bridegroom, NP   5 mg at 09/06/23 2015   And   diphenhydrAMINE (BENADRYL) capsule 50 mg  50 mg Oral TID PRN Eligha Bridegroom, NP       haloperidol lactate (HALDOL) injection 5 mg  5 mg Intramuscular TID PRN Eligha Bridegroom, NP       And   diphenhydrAMINE (BENADRYL) injection 50 mg  50 mg Intramuscular TID PRN Eligha Bridegroom, NP       And   LORazepam (ATIVAN) injection 2 mg  2 mg Intramuscular TID PRN Eligha Bridegroom, NP       haloperidol lactate (HALDOL) injection 10 mg  10 mg Intramuscular TID PRN Eligha Bridegroom, NP       And   diphenhydrAMINE (BENADRYL) injection 50 mg  50 mg Intramuscular TID PRN Eligha Bridegroom, NP       And   LORazepam (ATIVAN) injection 2 mg  2 mg Intramuscular TID PRN Eligha Bridegroom, NP       hydrOXYzine (ATARAX) tablet 25 mg  25 mg Oral TID PRN Eligha Bridegroom, NP       magnesium hydroxide (MILK OF MAGNESIA) suspension 30 mL  30 mL Oral Daily PRN Eligha Bridegroom, NP       nicotine (NICODERM CQ - dosed in mg/24 hours) patch 14 mg  14 mg Transdermal Daily Miguel Rota, MD   14 mg at 09/07/23 0820   QUEtiapine (SEROQUEL XR) 24 hr tablet 100 mg  100 mg Oral QHS Eligha Bridegroom, NP   100 mg at 09/06/23 2015   vortioxetine HBr (TRINTELLIX) tablet 5 mg  5 mg Oral Daily Eligha Bridegroom, NP    5 mg at 09/07/23 4098   PTA Medications: Medications Prior to Admission  Medication Sig Dispense Refill Last Dose/Taking   atovaquone (MEPRON) 750 MG/5ML suspension Take 10 mLs (1,500 mg total) by mouth daily with breakfast. 300 mL 0    methocarbamol (ROBAXIN) 500 MG tablet Take 1 tablet (500 mg total) by mouth every 6 (six) hours as needed for muscle spasms. 20 tablet 0    QUEtiapine (SEROQUEL) 100 MG tablet Take 1 tablet (100 mg total) by mouth at bedtime. 30 tablet 0    sertraline (ZOLOFT) 50 MG tablet Take 1 tablet (50 mg total) by mouth daily. 30 tablet 0     Patient Stressors:    Patient Strengths:    Treatment Modalities: Medication Management, Group therapy, Case management,  1 to 1 session with clinician, Psychoeducation, Recreational therapy.   Physician Treatment Plan for Primary Diagnosis: Substance-induced psychotic disorder (HCC) Long Term Goal(s):     Short Term Goals:    Medication Management: Evaluate patient's response, side effects, and tolerance of medication regimen.  Therapeutic Interventions: 1 to 1 sessions, Unit Group sessions and Medication administration.  Evaluation of Outcomes: Not Progressing  Physician Treatment Plan for Secondary Diagnosis: Principal Problem:  Substance-induced psychotic disorder (HCC)  Long Term Goal(s):     Short Term Goals:       Medication Management: Evaluate patient's response, side effects, and tolerance of medication regimen.  Therapeutic Interventions: 1 to 1 sessions, Unit Group sessions and Medication administration.  Evaluation of Outcomes: Not Progressing   RN Treatment Plan for Primary Diagnosis: Substance-induced psychotic disorder (HCC) Long Term Goal(s): Knowledge of disease and therapeutic regimen to maintain health will improve  Short Term Goals: Ability to remain free from injury will improve, Ability to verbalize frustration and anger appropriately will improve, Ability to demonstrate self-control,  Ability to participate in decision making will improve, Ability to verbalize feelings will improve, Ability to disclose and discuss suicidal ideas, Ability to identify and develop effective coping behaviors will improve, and Compliance with prescribed medications will improve  Medication Management: RN will administer medications as ordered by provider, will assess and evaluate patient's response and provide education to patient for prescribed medication. RN will report any adverse and/or side effects to prescribing provider.  Therapeutic Interventions: 1 on 1 counseling sessions, Psychoeducation, Medication administration, Evaluate responses to treatment, Monitor vital signs and CBGs as ordered, Perform/monitor CIWA, COWS, AIMS and Fall Risk screenings as ordered, Perform wound care treatments as ordered.  Evaluation of Outcomes: Not Progressing   LCSW Treatment Plan for Primary Diagnosis: Substance-induced psychotic disorder [facility]) Long Term Goal(s): Safe transition to appropriate next level of care at discharge, Engage patient in therapeutic group addressing interpersonal concerns.  Short Term Goals: Engage patient in aftercare planning with referrals and resources, Increase social support, Increase ability to appropriately verbalize feelings, Increase emotional regulation, Facilitate acceptance of mental health diagnosis and concerns, Facilitate patient progression through stages of change regarding substance use diagnoses and concerns, Identify triggers associated with mental health/substance abuse issues, and Increase skills for wellness and recovery  Therapeutic Interventions: Assess for all discharge needs, 1 to 1 time with Social worker, Explore available resources and support systems, Assess for adequacy in community support network, Educate family and significant other(s) on suicide prevention, Complete Psychosocial Assessment, Interpersonal group therapy.  Evaluation of Outcomes: Not  Progressing   Progress in Treatment: Attending groups: No. Participating in groups: No. Taking medication as prescribed: Yes. Toleration medication: Yes. Family/Significant other contact made: No, will contact:  consents pending Patient understands diagnosis: Yes. Discussing patient identified problems/goals with staff: Yes. Medical problems stabilized or resolved: Yes. Denies suicidal/homicidal ideation: No Issues/concerns per patient self-inventory: No.  New problem(s) identified: No, Describe:  none  New Short Term/Long Term Goal(s): medication stabilization, elimination of SI thoughts, development of comprehensive mental wellness plan.    Patient Goals:  "Lower my depression"  Discharge Plan or Barriers: Patient recently admitted. CSW will continue to follow and assess for appropriate referrals and possible discharge planning.    Reason for Continuation of Hospitalization: Depression Hallucinations Medication stabilization Suicidal ideation  Estimated Length of Stay: 5-7 days  Last 3 [place] Suicide Severity Risk Score: Flowsheet Row [facility] Admission (Discharged) from 08/11/2023 in [facility]  C-SSRS RISK CATEGORY High Risk High Risk Error: Q7 should not be populated when Q6 is No       Last PHQ 2/9 Scores:    03/29/2023    8:45 AM 12/28/2022   10:49 AM 12/23/2022   10:38 AM  Depression screen PHQ 2/9  Decreased Interest 2 2  0  Down, Depressed, Hopeless 0 3 0  PHQ - 2 Score 2 5 0  Altered sleeping 3 2   Tired, decreased energy 0 3   Change in appetite 0 3   Feeling bad or failure about yourself  0 3   Trouble concentrating 0 3   Moving slowly or fidgety/restless 0 3   Suicidal thoughts 2 2   PHQ-9 Score 7 24   Difficult doing work/chores Somewhat difficult Somewhat  difficult     Scribe for Treatment Team: Kathi Der, LCSWA 09/07/2023 1:33 PM

## 2023-09-07 NOTE — Progress Notes (Signed)
   09/07/23 0820  Psych Admission Type (Psych Patients Only)  Admission Status Voluntary  Psychosocial Assessment  Patient Complaints None  Eye Contact Fair  Facial Expression Animated  Affect Appropriate to circumstance  Speech Logical/coherent  Interaction Assertive  Motor Activity Other (Comment) (WDL)  Appearance/Hygiene Unremarkable  Behavior Characteristics Cooperative  Mood Pleasant  Thought Process  Coherency WDL  Content WDL  Delusions None reported or observed  Perception WDL  Hallucination None reported or observed  Judgment Poor  Confusion None  Danger to Self  Current suicidal ideation? Denies  Agreement Not to Harm Self Yes  Description of Agreement verbal  Danger to Others  Danger to Others None reported or observed

## 2023-09-07 NOTE — Progress Notes (Signed)
 Pt observed isolating to his room. New admit on the previous shift. After shift change, Pt asked for his medication stating that "he could feel the agitation rising" and that "he was going to go off". Pt took his scheduled med of Seroquel and PRN med Haldol 5 mg for agitation. Medication proved affective. Pt denies HI and AVH. Endorses passive SI, but contracts for safety. Pt cooperative. Pt on 15 min checks for safety. COWS score is= 2 at midnight. Pt encouraged to notify staff of any needs or concerns. Pt remains safe on the unit.     09/06/23 2045  Psych Admission Type (Psych Patients Only)  Admission Status Voluntary  Psychosocial Assessment  Patient Complaints Depression;Substance abuse;Agitation  Eye Contact Fair  Facial Expression Animated  Affect Irritable  Speech Logical/coherent  Interaction Assertive  Motor Activity Other (Comment) (WNL)  Appearance/Hygiene In scrubs  Behavior Characteristics Cooperative;Agitated;Anxious  Mood Anxious  Thought Process  Coherency WDL  Content WDL  Delusions None reported or observed  Perception WDL  Hallucination None reported or observed  Judgment Impaired  Confusion None  Danger to Self  Current suicidal ideation? Passive  Description of Suicide Plan No Plan  Self-Injurious Behavior No self-injurious ideation or behavior indicators observed or expressed   Agreement Not to Harm Self Yes  Description of Agreement Verbal Contract  Danger to Others  Danger to Others None reported or observed

## 2023-09-07 NOTE — Group Note (Signed)
 Date:  09/07/2023 Time:  9:23 AM  Group Topic/Focus:  Goals Group:   The focus of this group is to help patients establish daily goals to achieve during treatment and discuss how the patient can incorporate goal setting into their daily lives to aide in recovery.    Participation Level:  Did Not Attend  Lee Bridges Score 09/07/2023, 9:23 AM

## 2023-09-07 NOTE — Plan of Care (Signed)
  Problem: Education: Goal: Mental status will improve Outcome: Progressing   Problem: Activity: Goal: Sleeping patterns will improve Outcome: Progressing   

## 2023-09-07 NOTE — Group Note (Signed)
 Date:  09/07/2023 Time:  8:57 PM  Group Topic/Focus:  Narcotics Anonymous (NA) Meeting    Participation Level:  Active  Participation Quality:  Appropriate  Affect:  Appropriate  Cognitive:  Appropriate  Insight: Appropriate  Engagement in Group:  Engaged  Modes of Intervention:  Socialization and Support  Additional Comments:  Patient attended NA Meeting   Kennieth Francois 09/07/2023, 8:57 PM

## 2023-09-07 NOTE — [facility] Group Notes (Signed)
 [facility] Group Notes:  (Nursing/MHT/Case Management/Adjunct)  Date:  09/07/2023  Time:  12:13 AM  Type of Therapy:   Wrap-up group  Participation Level:  Did Not Attend  Participation Quality:    Affect:    Cognitive:    Insight:    Engagement in Group:    Modes of Intervention:    Summary of Progress/Problems: Didn't attend.   Noah Delaine 09/07/2023, 12:13 AM

## 2023-09-08 ENCOUNTER — Telehealth (INDEPENDENT_AMBULATORY_CARE_PROVIDER_SITE_OTHER): Payer: MEDICAID | Admitting: Infectious Diseases

## 2023-09-08 DIAGNOSIS — B20 Human immunodeficiency virus [HIV] disease: Secondary | ICD-10-CM

## 2023-09-08 DIAGNOSIS — F19959 Other psychoactive substance use, unspecified with psychoactive substance-induced psychotic disorder, unspecified: Secondary | ICD-10-CM

## 2023-09-08 DIAGNOSIS — F331 Major depressive disorder, recurrent, moderate: Secondary | ICD-10-CM

## 2023-09-08 LAB — COMPREHENSIVE METABOLIC PANEL WITH GFR
ALT: 16 U/L (ref 0–44)
AST: 17 U/L (ref 15–41)
Albumin: 2.9 g/dL — ABNORMAL LOW (ref 3.5–5.0)
Alkaline Phosphatase: 46 U/L (ref 38–126)
Anion gap: 8 (ref 5–15)
BUN: 10 mg/dL (ref 6–20)
CO2: 22 mmol/L (ref 22–32)
Calcium: 9 mg/dL (ref 8.9–10.3)
Chloride: 109 mmol/L (ref 98–111)
Creatinine, Ser: 1.03 mg/dL (ref 0.61–1.24)
GFR, Estimated: >60 mL/min (ref >60)
Glucose, Bld: 92 mg/dL (ref 70–99)
Potassium: 3.5 mmol/L (ref 3.5–5.1)
Sodium: 139 mmol/L (ref 135–145)
Total Bilirubin: 0.4 mg/dL (ref 0.0–1.2)
Total Protein: 6.4 g/dL — ABNORMAL LOW (ref 6.5–8.1)

## 2023-09-08 LAB — CBC WITH DIFFERENTIAL/PLATELET
Abs Immature Granulocytes: 0.03 10*3/uL (ref 0.00–0.07)
Basophils Absolute: 0.1 10*3/uL (ref 0.0–0.1)
Basophils Relative: 1 %
Eosinophils Absolute: 0.5 10*3/uL (ref 0.0–0.5)
Eosinophils Relative: 14 %
HCT: 34.2 % — ABNORMAL LOW (ref 39.0–52.0)
Hemoglobin: 11.5 g/dL — ABNORMAL LOW (ref 13.0–17.0)
Immature Granulocytes: 1 %
Lymphocytes Relative: 39 %
Lymphs Abs: 1.4 10*3/uL (ref 0.7–4.0)
MCH: 35.5 pg — ABNORMAL HIGH (ref 26.0–34.0)
MCHC: 33.6 g/dL (ref 30.0–36.0)
MCV: 105.6 fL — ABNORMAL HIGH (ref 80.0–100.0)
Monocytes Absolute: 0.5 10*3/uL (ref 0.1–1.0)
Monocytes Relative: 15 %
Neutro Abs: 1.1 10*3/uL — ABNORMAL LOW (ref 1.7–7.7)
Neutrophils Relative %: 30 %
Platelets: 245 10*3/uL (ref 150–400)
RBC: 3.24 MIL/uL — ABNORMAL LOW (ref 4.22–5.81)
RDW: 12.8 % (ref 11.5–15.5)
WBC: 3.5 10*3/uL — ABNORMAL LOW (ref 4.0–10.5)
nRBC: 0 % (ref 0.0–0.2)

## 2023-09-08 MED ORDER — VORTIOXETINE HBR 5 MG PO TABS
5.0000 mg | ORAL_TABLET | Freq: Once | ORAL | Status: AC
  Administered 2023-09-08: 5 mg via ORAL
  Filled 2023-09-08: qty 1

## 2023-09-08 MED ORDER — METHOCARBAMOL 500 MG PO TABS
500.0000 mg | ORAL_TABLET | Freq: Four times a day (QID) | ORAL | Status: DC | PRN
  Administered 2023-09-08 – 2023-09-19 (×11): 500 mg via ORAL
  Filled 2023-09-08 (×10): qty 1

## 2023-09-08 MED ORDER — VORTIOXETINE HBR 10 MG PO TABS
10.0000 mg | ORAL_TABLET | Freq: Every day | ORAL | Status: DC
  Administered 2023-09-09 – 2023-09-13 (×5): 10 mg via ORAL
  Filled 2023-09-08 (×6): qty 1

## 2023-09-08 NOTE — Progress Notes (Signed)
   09/08/23 2200  Psych Admission Type (Psych Patients Only)  Admission Status Voluntary  Psychosocial Assessment  Patient Complaints Worrying  Eye Contact Fair  Facial Expression Animated  Affect Appropriate to circumstance  Speech Logical/coherent  Interaction Assertive  Motor Activity Other (Comment) (WNL)  Appearance/Hygiene Unremarkable  Behavior Characteristics Cooperative  Mood Pleasant  Thought Process  Coherency WDL  Content WDL  Delusions None reported or observed  Perception WDL  Hallucination None reported or observed  Judgment Poor  Confusion None  Danger to Self  Current suicidal ideation? Denies  Description of Suicide Plan None  Self-Injurious Behavior No self-injurious ideation or behavior indicators observed or expressed   Agreement Not to Harm Self Yes  Description of Agreement Verbal  Danger to Others  Danger to Others None reported or observed

## 2023-09-08 NOTE — Progress Notes (Signed)
   09/08/23 0000  Psych Admission Type (Psych Patients Only)  Admission Status Voluntary  Psychosocial Assessment  Patient Complaints Anxiety  Eye Contact Fair  Facial Expression Animated  Affect Appropriate to circumstance  Speech Logical/coherent  Interaction Assertive  Motor Activity Other (Comment) (WNL)  Appearance/Hygiene Unremarkable  Behavior Characteristics Cooperative  Mood Pleasant;Euthymic  Thought Process  Coherency WDL  Content WDL  Delusions None reported or observed  Perception WDL  Hallucination None reported or observed  Judgment Poor  Confusion None  Danger to Self  Current suicidal ideation? Denies  Description of Suicide Plan None  Self-Injurious Behavior No self-injurious ideation or behavior indicators observed or expressed   Agreement Not to Harm Self Yes  Description of Agreement Verbal  Danger to Others  Danger to Others None reported or observed

## 2023-09-08 NOTE — Progress Notes (Signed)
   09/08/23 1800  Psych Admission Type (Psych Patients Only)  Admission Status Voluntary  Psychosocial Assessment  Patient Complaints None  Eye Contact Fair  Facial Expression Animated  Affect Appropriate to circumstance  Speech Logical/coherent  Interaction Assertive  Motor Activity Other (Comment) (wnl)  Appearance/Hygiene Unremarkable  Behavior Characteristics Cooperative  Mood Pleasant  Thought Process  Coherency WDL  Content WDL  Delusions None reported or observed  Perception WDL  Hallucination None reported or observed  Judgment Poor  Confusion None  Danger to Self  Current suicidal ideation? Denies  Description of Suicide Plan none  Self-Injurious Behavior No self-injurious ideation or behavior indicators observed or expressed   Agreement Not to Harm Self Yes  Description of Agreement verbal  Danger to Others  Danger to Others None reported or observed

## 2023-09-08 NOTE — Telephone Encounter (Signed)
 D/W Dr. Woodroe Mode at [facility] regarding HIV pertinent questions while Lee Bridges is admitted to [facility] for exacerbation of SI and MDD.   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 LP was abnormal however VDRL was non-reactive (sensitivity ~70%). Decision was to undergo continuous IV PCN infusion for 14d   Syphilis Tx History -  01/25/21 - RPR Titer 1: >16 - Had secondary syphilis symptoms described in 2020 that resolved.   03/20/21 - first visit with [facility]. Titer 1:16 - completed 3 weekly injections bicillin (03/20/21, 03/27/21, 04/06/21).   07/02/2021 - RPR 1:8  12/23/22 - RPR 1:8  03/11/23 - RPR 1:32 ** never treated, looks like new infection since July   07/29/2023 - RPR 1:>256 with new neurologic symptoms prompting hospital evaluation. Mild pleocytosis noted on LP, negative VDRL. IV PCN 2/28 - 08/02/23 during admission with D/C home 3/05 with midline to finish through 3/14 however 3/7 he came to ER and line was removed and he left before completing ER visit (completed 10-11 days of treatment out of recommended 14 by this time)  08/10/2023 - back to hospital this time [facility] after he presented voluntarily for SI / MDD exacerbation. Received 2 weeks IV PCN (3/13 - 08/22/23) and two doses 2.4 million units IM bicillin 3/13, 3/24 while getting IV PCN.  He missed follow up on 4/3 in ID clinic.   In review of his chart and records [name]'t feel strongly he needs an additional dose of the bicillin as his infection appears to be early between July 2024 and October 2024.   I will repeat a RPR, CD4 and HIV RNA level (last one was 196,000 copies in February).   Continue biktarvy once daily, regarding pneumocystis prophylaxis he can continue either Bactrim 1 SS daily or Atovaquone 1500 mg daily. His creatinine is normal but Lee Bridges does not want to "shut down his kidneys" per what he recalls from previous [facility] ID team.  Total encounter review with discussion  with [facility] team and review of medical records / state lab 32 minutes   Lee Alberts, MSN, NP-C [facility] for Infectious Disease [facility]  [email or web address] Pager: [phone] Office: [phone] [facility] Main Line: [phone] *Secure Chat Communication Welcome

## 2023-09-08 NOTE — Progress Notes (Signed)
 Patient states he was receiving Robaxin 500 mg PO for muscle spasms at home and would like to continue.
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
## 2023-09-08 NOTE — Group Note (Signed)
 LCSW Group Therapy Note   Group Date: 09/08/2023 Start Time: 1100 End Time: 1200  Participation:  did not attend  Type of Therapy:  Group Therapy  Title:  [facility]:  A Safe Space for Grief  Objective: [facility]:  A Oncologist for Grief aims to provide a compassionate environment for participants to process grief, explore its stages, and create personal rituals to honor loved ones.  Goals: Foster a safe, non-judgmental space for sharing grief experiences. Educate on the stages of grief, emphasizing that healing is unique to each individual. Introduce rituals as a meaningful way to cope and honor lost loved ones.  Summary: In [facility], participants explored their unique grief journeys, learning that grief is non-linear and personal. We discussed the 5 stages (denial, anger, bargaining, depression, and acceptance), and how rituals--such as lighting candles or memory walks--can offer comfort. Emphasis was placed on self-care, emotional expression, and gratitude for loved ones. Healing was framed as a process without a set timeline, with a focus on individualized paths to honoring loss.  Therapeutic Modalities: Grief Counseling: Providing emotional support through active listening and validation of feelings. Elements of DBT:  Mindfulness Practices: Encouraging present-moment awareness to reduce emotional distress. Cognitive Behavioral Therapy (CBT): Challenging negative thought patterns associated with grief.   Alla Feeling, LCSWA 09/08/2023  2:48 PM

## 2023-09-08 NOTE — H&P (Signed)
 Psychiatric Admission Assessment Adult  Patient Identification: Lee Bridges MRN:  [id number] Date of Evaluation:  09/08/2023 Chief Complaint:  "I'm just trying to die and it's not working" Principal Diagnosis: Substance-induced psychotic disorder (HCC) Diagnosis:  Principal Problem:   Substance-induced psychotic disorder (HCC) Active Problems:   MDD (major depressive disorder), recurrent episode, moderate (HCC) 2. Cocaine use disorder severe 3. History of  neurosyphilis which was treated History of methamphetamine abuse Rule out bipolar depression  History of Present Illness:  Per psychiatric evaluation in the ED: "33 year old male presents emergency department with complaints of auditory/visual sedation as well as suicidal ideation.  States he has been having symptoms for the past couple of weeks.  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  States that he had some of his behavioral health medications adjusted at that time with increasing his dose of Seroquel and addition of another medication that he cannot exactly remember.  States that while he is in the hospital, began to see people that were not there as well as hear people talking that were not there.  States that the symptoms have become more regular ever since being discharged over the past week or 2.  States that the voices are telling him to harm himself.  States that earlier today, had a desire to "blow my brains out" or "stab myself."  Patient denies any direct plan.  Denies any access to a firearm.  States that he feels like this is related to his medication that he was started on.  Denies any visual disturbance, gait abnormality, slurred speech, facial droop, weakness with history of reasonable extremities.  Denies any substance use.  States that he does have a history of polysubstance use with most recent use 3 weeks ago.    Past medical  history significant for HIV, polysubstance use, psychosis, syphilis, cocaine abuse, GAD, MDD, methamphetamine abuse   Patient Report on 32/8/24: Pt seen at [facility] for face to face psychiatric evaluation. Pt states since release from hospital where he was admitted for Neurosyphilis in the beginning of March, he has been experiencing AVH. He thinks since Zoloft was initiated he has been experiencing AVH hallucinations that have progressed to daily frequency and command AH. The command AH happen daily and tell him different ways to harm himself. PT does admit to cocaine and THC use, as well as medication noncompliance with seroquel, which likely are cause for worsening AVH. Pt stated he had been taking his Seroquel as a PRN medication, in which I informed him it is suppose to be scheduled, and daily. He was unaware of this. Also explained he needs to discontinue substance abuse immediately to assist with AVH.    Pt does not express desire to kill himself, however he stated the CAH to harm himself are stressful and has been more difficult to "block out." He does endorse VH of "random people." He denies HI. He does report "okay" sleep and appetite. Pt is wanting to return to inpatient treatment for medication adjustments and stabilization, as he does not feel safe discharging at this time.    Pt does not want to take Zoloft and would like to discontinue. He stated he previously took Trintillex which worked well for him in the past and would like to restart. Pt does like his Seroquel, but has not been taking it consistently. He also feels like it is "not enough coverage for the day time." Explained we  will change his Seroquel to extended release for further coverage of AVH.  Pt is agreeable with medication changes and plan for inpatient psychiatric treatment. Pt being reviewed by [facility] and [facility]. "    Patient seen with social worker for treatment team and reports ""I'm just trying to die and it's not working."  Patient has been abusing cocaine and cannabis with poor sleep and states he hears multiple voices non command in nature. Vague historian as to whether he has been taking medications recently or not but states he has nowhere to stay and no transportation so has not been able to followup with outpatient services and has not been taking his medications consistently. Patient claims that he has tried "every antipsychotic and none of them work." This is inconsistent with prior presentation to consult service where patient is documented as having had a positive respond to Seroquel. Patient also appears clear, linear and goal directed and does not appear to be responding to internal stimuli but does appear depressed.  Recommending inpatient residential rehab or sober living for patient which was discussed. He does have a prior history of malingering although also has 1 reported prior suicide attempt. Patient reports symptoms of depression for the last several 3-4 months as below occurring every day. Patient states he feels that Trintellix. Patient denies a clear history of manic episodes however bipolar disorder was in differential diagnosis with outpatient psychiatric provider. Patient states he is hearing voices but does not appear paranoid and is organized and linear.    Associated Signs/Symptoms: Depression Symptoms:  depressed mood, anhedonia, insomnia, psychomotor agitation, feelings of worthlessness/guilt, difficulty concentrating, hopelessness, suicidal thoughts with specific plan, loss of energy/fatigue, disturbed sleep, weight loss, (Hypo) Manic Symptoms:  Distractibility, Elevated Mood, Hallucinations, Impulsivity, Anxiety Symptoms:  Excessive Worry, Psychotic Symptoms:  Hallucinations: Auditory PTSD Symptoms: Negative Total Time spent with patient: 45 minutes  Past Psychiatric History:    Is the patient at risk to self? Yes.    Has the patient been a risk to self in the past 6  months? Yes.    Has the patient been a risk to self within the distant past? Yes.    Is the patient a risk to others? No.  Has the patient been a risk to others in the past 6 months? No.  Has the patient been a risk to others within the distant past? No.   [place] Scale:  Flowsheet Row [facility] ([facility]) [facility] from 09/05/2023 in [facility] Emergency Department at [facility] Admission (Discharged) from 08/11/2023 in [facility] ([facility])  C-SSRS RISK CATEGORY High Risk High Risk Error: Q7 should not be populated when Q6 is No        Prior Inpatient Therapy: Yes.   Patient with prior admission to [facility] after reported suicide attempt Prior Outpatient Therapy: Yes.   Sees Edie at [facility] for outpatient care intermittently  Alcohol Screening: 1. How often do you have a drink containing alcohol?: Never 2. How many drinks containing alcohol do you have on a typical day when you are drinking?: 1 or 2 3. How often do you have six or more drinks on one occasion?: Never AUDIT-C Score: 0 4. How often during the last year have you found that you were not able to stop drinking once you had started?: Never 5. How often during the last year have you failed to do what was normally expected from you because of drinking?:  Never 6. How often during the last year have you needed a first drink in the morning to get yourself going after a heavy drinking session?: Never 7. How often during the last year have you had a feeling of guilt of remorse after drinking?: Never 8. How often during the last year have you been unable to remember what happened the night before because you had been drinking?: Never 9. Have you or someone else been injured as a result of your drinking?: No 10. Has a relative or friend or a doctor or another health worker been concerned about your drinking or suggested you cut down?: No Alcohol Use  Disorder Identification Test Final Score (AUDIT): 0 Alcohol Brief Interventions/Follow-up: Alcohol education/Brief advice Substance Abuse History in the last 12 months:  Yes.   Consequences of Substance Abuse: Medical Consequences:  psychosis and poor compliance with medications, depression  Previous Psychotropic Medications: Yes  Psychological Evaluations: Yes  Past Medical History:  Past Medical History:  Diagnosis Date   Depression    HIV (human immunodeficiency virus infection) (HCC)    Psychosis (HCC)    Substance abuse (HCC)    History reviewed. No pertinent surgical history. Family History:  Family History  Problem Relation Age of Onset   Arthritis Mother    Diabetes Father    Hypertension Father    Family Psychiatric  History: denies Tobacco Screening:  Social History   Tobacco Use  Smoking Status Every Day   Current packs/day: 0.50   Types: Cigarettes  Smokeless Tobacco Never  Tobacco Comments   1 PPD    BH Tobacco Counseling     Are you interested in Tobacco Cessation Medications?  Yes, implement Nicotene Replacement Protocol Counseled patient on smoking cessation:  Yes Reason Tobacco Screening Not Completed: No value filed.       Social History:  Social History   Substance and Sexual Activity  Alcohol Use Not Currently   Comment: socially     Social History   Substance and Sexual Activity  Drug Use Yes   Types: Cocaine, Marijuana, Methamphetamines   Comment: weekly    Additional Social History:                           Allergies:   Allergies  Allergen Reactions   Trazodone And Nefazodone     severe   Fish Allergy Hives and Nausea And Vomiting   Shellfish Allergy Hives and Nausea And Vomiting   Lab Results:  Results for orders placed or performed during the hospital encounter of 09/06/23 (from the past 48 hours)  CBC with Differential/Platelet     Status: Abnormal   Collection Time: 09/08/23  6:23 AM  Result Value Ref  Range   WBC 3.5 (L) 4.0 - 10.5 K/uL   RBC 3.24 (L) 4.22 - 5.81 MIL/uL   Hemoglobin 11.5 (L) 13.0 - 17.0 g/dL   HCT 16.1 (L) 09.6 - 04.5 %   MCV 105.6 (H) 80.0 - 100.0 fL   MCH 35.5 (H) 26.0 - 34.0 pg   MCHC 33.6 30.0 - 36.0 g/dL   RDW 40.9 81.1 - 91.4 %   Platelets 245 150 - 400 K/uL   nRBC 0.0 0.0 - 0.2 %   Neutrophils Relative % 30 %   Neutro Abs 1.1 (L) 1.7 - 7.7 K/uL   Lymphocytes Relative 39 %   Lymphs Abs 1.4 0.7 - 4.0 K/uL   Monocytes Relative 15 %   Monocytes  Absolute 0.5 0.1 - 1.0 K/uL   Eosinophils Relative 14 %   Eosinophils Absolute 0.5 0.0 - 0.5 K/uL   Basophils Relative 1 %   Basophils Absolute 0.1 0.0 - 0.1 K/uL   Immature Granulocytes 1 %   Abs Immature Granulocytes 0.03 0.00 - 0.07 K/uL    Comment: Performed at [facility], [address], [place], [place] [address]  Comprehensive metabolic panel     Status: Abnormal   Collection Time: 09/08/23  6:23 AM  Result Value Ref Range   Sodium 139 135 - 145 mmol/L   Potassium 3.5 3.5 - 5.1 mmol/L   Chloride 109 98 - 111 mmol/L   CO2 22 22 - 32 mmol/L   Glucose, Bld 92 70 - 99 mg/dL    Comment: Glucose reference range applies only to samples taken after fasting for at least 8 hours.   BUN 10 6 - 20 mg/dL   Creatinine, Ser 6.04 0.61 - 1.24 mg/dL   Calcium 9.0 8.9 - 54.0 mg/dL   Total Protein 6.4 (L) 6.5 - 8.1 g/dL   Albumin 2.9 (L) 3.5 - 5.0 g/dL   AST 17 15 - 41 U/L   ALT 16 0 - 44 U/L   Alkaline Phosphatase 46 38 - 126 U/L   Total Bilirubin 0.4 0.0 - 1.2 mg/dL   GFR, Estimated >98 >11 mL/min    Comment: (NOTE) Calculated using the CKD-EPI Creatinine Equation (2021)    Anion gap 8 5 - 15    Comment: Performed at [facility], [address], [place], [place] [address]    Blood Alcohol level:  Lab Results  Component Value Date   [facility] <10 09/05/2023   ETH <10 08/08/2023    Metabolic Disorder Labs:  Lab Results  Component Value Date   HGBA1C 4.6 (L) 08/12/2023    MPG 85.32 08/12/2023   No results found for: "PROLACTIN" Lab Results  Component Value Date   CHOL 167 08/12/2023   TRIG 239 (H) 08/12/2023   HDL 30 (L) 08/12/2023   CHOLHDL 5.6 08/12/2023   VLDL 48 (H) 08/12/2023   LDLCALC 89 08/12/2023    Current Medications: Current Facility-Administered Medications  Medication Dose Route Frequency Provider Last Rate Last Admin   acetaminophen (TYLENOL) tablet 650 mg  650 mg Oral Q6H PRN Eligha Bridegroom, NP       alum & mag hydroxide-simeth (MAALOX/MYLANTA) 200-200-20 MG/5ML suspension 30 mL  30 mL Oral Q4H PRN Eligha Bridegroom, NP       atovaquone (MEPRON) 750 MG/5ML suspension 1,500 mg  1,500 mg Oral Q breakfast Odesser Tourangeau, MD       bictegravir-emtricitabine-tenofovir AF (BIKTARVY) 50-200-25 MG per tablet 1 tablet  1 tablet Oral Daily Blanchard Kelch, NP   1 tablet at 09/07/23 1530   haloperidol (HALDOL) tablet 5 mg  5 mg Oral TID PRN Eligha Bridegroom, NP   5 mg at 09/06/23 2015   And   diphenhydrAMINE (BENADRYL) capsule 50 mg  50 mg Oral TID PRN Eligha Bridegroom, NP       haloperidol lactate (HALDOL) injection 5 mg  5 mg Intramuscular TID PRN Eligha Bridegroom, NP       And   diphenhydrAMINE (BENADRYL) injection 50 mg  50 mg Intramuscular TID PRN Eligha Bridegroom, NP       And   LORazepam (ATIVAN) injection 2 mg  2 mg Intramuscular TID PRN Eligha Bridegroom, NP       haloperidol lactate (HALDOL) injection  10 mg  10 mg Intramuscular TID PRN Eligha Bridegroom, NP       And   diphenhydrAMINE (BENADRYL) injection 50 mg  50 mg Intramuscular TID PRN Eligha Bridegroom, NP       And   LORazepam (ATIVAN) injection 2 mg  2 mg Intramuscular TID PRN Eligha Bridegroom, NP       hydrOXYzine (ATARAX) tablet 25 mg  25 mg Oral TID PRN Eligha Bridegroom, NP       magnesium hydroxide (MILK OF MAGNESIA) suspension 30 mL  30 mL Oral Daily PRN Eligha Bridegroom, NP       nicotine (NICODERM CQ - dosed in mg/24 hours) patch 14 mg  14 mg Transdermal Daily Miguel Rota, MD   14 mg at 09/07/23 0820   QUEtiapine (SEROQUEL XR) 24 hr tablet 200 mg  200 mg Oral QHS Dian Minahan, MD   200 mg at 09/07/23 2129   vortioxetine HBr (TRINTELLIX) tablet 5 mg  5 mg Oral Daily Eligha Bridegroom, NP   5 mg at 09/07/23 4132   PTA Medications: Medications Prior to Admission  Medication Sig Dispense Refill Last Dose/Taking   atovaquone (MEPRON) 750 MG/5ML suspension Take 10 mLs (1,500 mg total) by mouth daily with breakfast. 300 mL 0    methocarbamol (ROBAXIN) 500 MG tablet Take 1 tablet (500 mg total) by mouth every 6 (six) hours as needed for muscle spasms. 20 tablet 0    QUEtiapine (SEROQUEL) 100 MG tablet Take 1 tablet (100 mg total) by mouth at bedtime. 30 tablet 0    sertraline (ZOLOFT) 50 MG tablet Take 1 tablet (50 mg total) by mouth daily. 30 tablet 0     Musculoskeletal: Strength & Muscle Tone: within normal limits Gait & Station: normal Patient leans: N/A            Psychiatric Specialty Exam:  Presentation  General Appearance:  Appropriate for Environment  Eye Contact: Fair  Speech: Normal Rate  Speech Volume: Normal  Handedness: Right   Mood and Affect  Mood: Depressed  Affect: Congruent   Thought Process  Thought Processes: Goal Directed  Duration of Psychotic Symptoms: patient reports for over a year however does not appear to be responding to internal stimuli Past Diagnosis of Schizophrenia or Psychoactive disorder: No  Descriptions of Associations:Intact  Orientation:Full (Time, Place and Person)  Thought Content:WDL  Hallucinations:Hallucinations: Auditory Description of Auditory Hallucinations: Patient reports hearing voices but does not appear to be responding to internal stimuli or disorganized  Ideas of Reference:None  Suicidal Thoughts:Suicidal Thoughts: Yes, Active SI Active Intent and/or Plan: With Plan  Homicidal Thoughts:Homicidal Thoughts: No   Sensorium  Memory: Immediate  Fair  Judgment: Poor  Insight: Poor   Executive Functions  Concentration: Fair  Attention Span: Fair  Recall: [employer] of Knowledge: Fair  Language: Fair   Psychomotor Activity  Psychomotor Activity: Psychomotor Activity: Normal   Assets  Assets: Desire for Improvement; Communication Skills   Sleep  Sleep: Sleep: Poor    Physical Exam: Physical exam: Please see exam on admit note. General: Well developed, well nourished.  Pupils: Normal at 3mm Respiratory: Breathing is unlabored.  Cardiovascular: No edema.  Language: No anomia, no aphasia Muscle strength and tone-pt moving all extremities.  Gait not assessed as pt remained in bed.  Neuro: Facial muscles are symmetric. Pt without tremor, no evidence of hyperarousal.  Review of Systems  Constitutional: Negative.   HENT: Negative.    Eyes: Negative.   Respiratory: Negative.  Cardiovascular: Negative.   Gastrointestinal: Negative.   Genitourinary: Negative.   Musculoskeletal: Negative.   Skin: Negative.   Neurological:  Positive for tingling (persistent intermittent left arm numbness since last year).  Endo/Heme/Allergies: Negative.   Psychiatric/Behavioral:  Positive for depression, substance abuse and suicidal ideas.    Blood pressure 119/89, pulse 94, temperature 98.3 F (36.8 C), temperature source Oral, resp. rate 16, height 5\' 2"  (1.575 m), weight 57.2 kg, SpO2 100%. Body mass index is 23.05 kg/m.  Treatment Plan Summary: 33 y.o. male  never married African-American male with a self-reported history of  self reported " schizophrenia, bipolar with psychotic features", polysubstance abuse (methamphetamine, cocaine, marijuana), and documented history of MDD and substance induced mood disorder and psychosis as well as medical history of AIDS, neurosyphilis, noncompliance with medications.    Patient has been abusing cocaine and cannabis with poor sleep and states he hears multiple voices non  command in nature today.  Vague historian as to whether he has been taking medications recently or not but states he has nowhere to stay and no transportation so has not been able to followup with outpatient services and has not been taking his medications consistently. Patient claims that he has tried "every antipsychotic and none of them work." This is inconsistent with prior presentation to consult service where patient is documented as having had a positive respond to Seroquel. Patient also appears clear, linear and goal directed and does not appear to be responding to internal stimuli but does appear depressed. Patient does appear dysphoric and reports SI with plan to overdose but denies intent.   Patient recently treated for neurosyphilis and has been poorly compliant with HIV medications. I contacted ARNP Dixon his outpatient provider and will restart Atovaquone (patient refusing bactrim due to recent AKI where consult ID advised against) as well as Bictarvy.   Patient reports prior benefit from Rexuli although overall poor and vague historian stating "I've tried every medication" but can only name a few. I reached out to his outpatient provider Flossie Dibble who reported benefit from Seroquel.   Daily contact with patient to assess and evaluate symptoms and progress in treatment, Medication management, and Plan -increase Seroquel XR 200 mg at bedtime which patient has historically had a positive response to per documentation  - increase Rexulti to 10 mg qdaily as patient reports possible prior benefit Observation Level/Precautions:  15 minute checks  Laboratory:  CBC Chemistry Profile UDS UA Vitamin B-12  Psychotherapy:  will attend group therapy  Medications:  Seroquel and Rexulti  Consultations:  curbside consult with ID (ordered CD4 counts as well as HIV testing)  Discharge Concerns:  needs residential rehab or sober living and stable living environment as well as likely ACTS services   Estimated LOS:5-7 days  Other:     Physician Treatment Plan for Primary Diagnosis: Substance-induced psychotic disorder (HCC) Long Term Goal(s): Improvement in symptoms so as ready for discharge  Short Term Goals: Ability to identify changes in lifestyle to reduce recurrence of condition will improve, Ability to verbalize feelings will improve, Ability to disclose and discuss suicidal ideas, Ability to demonstrate self-control will improve, Ability to identify and develop effective coping behaviors will improve, Ability to maintain clinical measurements within normal limits will improve, Compliance with prescribed medications will improve, and Ability to identify triggers associated with substance abuse/mental health issues will improve  Physician Treatment Plan for Secondary Diagnosis: Principal Problem:   Substance-induced psychotic disorder (HCC) Active Problems:   MDD (major depressive disorder),  recurrent episode, moderate (HCC)  Long Term Goal(s): Improvement in symptoms so as ready for discharge  Short Term Goals: Ability to identify changes in lifestyle to reduce recurrence of condition will improve, Ability to verbalize feelings will improve, Ability to disclose and discuss suicidal ideas, Ability to demonstrate self-control will improve, Ability to identify and develop effective coping behaviors will improve, Ability to maintain clinical measurements within normal limits will improve, and Compliance with prescribed medications will improve  I certify that inpatient services furnished can reasonably be expected to improve the patient's condition.    Miguel Rota, MD 4/10/20257:52 AM

## 2023-09-08 NOTE — Plan of Care (Signed)
   Problem: Education: Goal: Knowledge of [facility] Education information/materials will improve Outcome: Progressing Goal: Emotional status will improve Outcome: Progressing Goal: Mental status will improve Outcome: Progressing Goal: Verbalization of understanding the information provided will improve Outcome: Progressing

## 2023-09-08 NOTE — Progress Notes (Signed)
 [facility] MD Progress Note  09/08/2023 3:00 PM Lee Bridges  MRN:  [id number] Subjective:    Patient reports "I have HIV, I cannot be in a room this hot... I can't even go to groups it's so hot. Patient is irritable stating that his room is too hot because his roommate has turned up the heat. Patient's room was indeed hot when I was inside. Reports he had difficulty sleeping due to room being too hot.  Reports that he does not feel like the medication is making him. Patient reports SI without a plan. Does not appear to be responding to internal stimuli. Patient states he is interested in residential rehab or sobering living as well as ACTs services. Patient is future oriented    Principal Problem: Substance-induced psychotic disorder (HCC) Diagnosis: Principal Problem:   Substance-induced psychotic disorder (HCC) Active Problems:   MDD (major depressive disorder), recurrent episode, moderate (HCC)  Total Time spent with patient: 30 minutes  Past Psychiatric History:   Past Medical History:  Past Medical History:  Diagnosis Date   Depression    HIV (human immunodeficiency virus infection) (HCC)    Psychosis (HCC)    Substance abuse (HCC)    History reviewed. No pertinent surgical history. Family History:  Family History  Problem Relation Age of Onset   Arthritis Mother    Diabetes Father    Hypertension Father    Family Psychiatric  History: denies Social History:  Social History   Substance and Sexual Activity  Alcohol Use Not Currently   Comment: socially     Social History   Substance and Sexual Activity  Drug Use Yes   Types: Cocaine, Marijuana, Methamphetamines   Comment: weekly    Social History   Socioeconomic History   Marital status: Single    Spouse name: Not on file   Number of children: 0   Years of education: Not on file   Highest education level: Associate degree: academic program  Occupational History   Not on file  Tobacco Use   Smoking status:  Every Day    Current packs/day: 0.50    Types: Cigarettes   Smokeless tobacco: Never   Tobacco comments:    1 PPD  Substance and Sexual Activity   Alcohol use: Not Currently    Comment: socially   Drug use: Yes    Types: Cocaine, Marijuana, Methamphetamines    Comment: weekly   Sexual activity: Not Currently  Other Topics Concern   Not on file  Social History Narrative   Homeless x 2 yrs.    Social Drivers of Corporate investment banker Strain: Not on file  Food Insecurity: Food Insecurity Present (09/06/2023)   Hunger Vital Sign    Worried About Running Out of Food in the Last Year: Often true    Ran Out of Food in the Last Year: Often true  Transportation Needs: Unmet Transportation Needs (09/06/2023)   PRAPARE - Administrator, Civil Service (Medical): Yes    Lack of Transportation (Non-Medical): Yes  Physical Activity: Not on file  Stress: Not on file  Social Connections: Socially Isolated (08/09/2023)   Social Connection and Isolation Panel [NHANES]    Frequency of Communication with Friends and Family: Never    Frequency of Social Gatherings with Friends and Family: Never    Attends Religious Services: Never    Database administrator or Organizations: No    Attends Banker Meetings: Never    Marital Status:  Never married   Additional Social History:                         Sleep: Fair  Appetite:  Fair  Current Medications: Current Facility-Administered Medications  Medication Dose Route Frequency Provider Last Rate Last Admin   acetaminophen (TYLENOL) tablet 650 mg  650 mg Oral Q6H PRN Eligha Bridegroom, NP   650 mg at 09/08/23 1302   alum & mag hydroxide-simeth (MAALOX/MYLANTA) 200-200-20 MG/5ML suspension 30 mL  30 mL Oral Q4H PRN Eligha Bridegroom, NP       atovaquone (MEPRON) 750 MG/5ML suspension 1,500 mg  1,500 mg Oral Q breakfast Shamond Skelton, MD   1,500 mg at 09/08/23 1358   bictegravir-emtricitabine-tenofovir AF  (BIKTARVY) 50-200-25 MG per tablet 1 tablet  1 tablet Oral Daily Blanchard Kelch, NP   1 tablet at 09/08/23 7829   haloperidol (HALDOL) tablet 5 mg  5 mg Oral TID PRN Eligha Bridegroom, NP   5 mg at 09/06/23 2015   And   diphenhydrAMINE (BENADRYL) capsule 50 mg  50 mg Oral TID PRN Eligha Bridegroom, NP       haloperidol lactate (HALDOL) injection 5 mg  5 mg Intramuscular TID PRN Eligha Bridegroom, NP       And   diphenhydrAMINE (BENADRYL) injection 50 mg  50 mg Intramuscular TID PRN Eligha Bridegroom, NP       And   LORazepam (ATIVAN) injection 2 mg  2 mg Intramuscular TID PRN Eligha Bridegroom, NP       haloperidol lactate (HALDOL) injection 10 mg  10 mg Intramuscular TID PRN Eligha Bridegroom, NP       And   diphenhydrAMINE (BENADRYL) injection 50 mg  50 mg Intramuscular TID PRN Eligha Bridegroom, NP       And   LORazepam (ATIVAN) injection 2 mg  2 mg Intramuscular TID PRN Eligha Bridegroom, NP       hydrOXYzine (ATARAX) tablet 25 mg  25 mg Oral TID PRN Eligha Bridegroom, NP       magnesium hydroxide (MILK OF MAGNESIA) suspension 30 mL  30 mL Oral Daily PRN Eligha Bridegroom, NP       methocarbamol (ROBAXIN) tablet 500 mg  500 mg Oral Q6H PRN Miguel Rota, MD   500 mg at 09/08/23 1302   nicotine (NICODERM CQ - dosed in mg/24 hours) patch 14 mg  14 mg Transdermal Daily Miguel Rota, MD   14 mg at 09/08/23 0759   QUEtiapine (SEROQUEL XR) 24 hr tablet 200 mg  200 mg Oral QHS Antoneo Ghrist, MD   200 mg at 09/07/23 2129   [START ON 09/09/2023] vortioxetine HBr (TRINTELLIX) tablet 10 mg  10 mg Oral Daily Miguel Rota, MD        Lab Results:  Results for orders placed or performed during the hospital encounter of 09/06/23 (from the past 48 hours)  CBC with Differential/Platelet     Status: Abnormal   Collection Time: 09/08/23  6:23 AM  Result Value Ref Range   WBC 3.5 (L) 4.0 - 10.5 K/uL   RBC 3.24 (L) 4.22 - 5.81 MIL/uL   Hemoglobin 11.5 (L) 13.0 - 17.0 g/dL   HCT 56.2 (L) 13.0 - 86.5 %   MCV  105.6 (H) 80.0 - 100.0 fL   MCH 35.5 (H) 26.0 - 34.0 pg   MCHC 33.6 30.0 - 36.0 g/dL   RDW 78.4 69.6 - 29.5 %   Platelets 245 150 - 400  K/uL   nRBC 0.0 0.0 - 0.2 %   Neutrophils Relative % 30 %   Neutro Abs 1.1 (L) 1.7 - 7.7 K/uL   Lymphocytes Relative 39 %   Lymphs Abs 1.4 0.7 - 4.0 K/uL   Monocytes Relative 15 %   Monocytes Absolute 0.5 0.1 - 1.0 K/uL   Eosinophils Relative 14 %   Eosinophils Absolute 0.5 0.0 - 0.5 K/uL   Basophils Relative 1 %   Basophils Absolute 0.1 0.0 - 0.1 K/uL   Immature Granulocytes 1 %   Abs Immature Granulocytes 0.03 0.00 - 0.07 K/uL    Comment: Performed at [facility], [address], [place], [place] [address]  Comprehensive metabolic panel     Status: Abnormal   Collection Time: 09/08/23  6:23 AM  Result Value Ref Range   Sodium 139 135 - 145 mmol/L   Potassium 3.5 3.5 - 5.1 mmol/L   Chloride 109 98 - 111 mmol/L   CO2 22 22 - 32 mmol/L   Glucose, Bld 92 70 - 99 mg/dL    Comment: Glucose reference range applies only to samples taken after fasting for at least 8 hours.   BUN 10 6 - 20 mg/dL   Creatinine, Ser 7.56 0.61 - 1.24 mg/dL   Calcium 9.0 8.9 - 43.3 mg/dL   Total Protein 6.4 (L) 6.5 - 8.1 g/dL   Albumin 2.9 (L) 3.5 - 5.0 g/dL   AST 17 15 - 41 U/L   ALT 16 0 - 44 U/L   Alkaline Phosphatase 46 38 - 126 U/L   Total Bilirubin 0.4 0.0 - 1.2 mg/dL   GFR, Estimated >29 >51 mL/min    Comment: (NOTE) Calculated using the CKD-EPI Creatinine Equation (2021)    Anion gap 8 5 - 15    Comment: Performed at [facility], [address], [place], [place] [address]    Blood Alcohol level:  Lab Results  Component Value Date   [facility] <10 09/05/2023   ETH <10 08/08/2023    Metabolic Disorder Labs: Lab Results  Component Value Date   HGBA1C 4.6 (L) 08/12/2023   MPG 85.32 08/12/2023   No results found for: "PROLACTIN" Lab Results  Component Value Date   CHOL 167 08/12/2023   TRIG 239 (H) 08/12/2023   HDL  30 (L) 08/12/2023   CHOLHDL 5.6 08/12/2023   VLDL 48 (H) 08/12/2023   LDLCALC 89 08/12/2023    Physical Findings: AIMS:  , ,  ,  ,    CIWA:    COWS:  COWS Total Score: 1  Musculoskeletal: Strength & Muscle Tone: within normal limits Gait & Station: normal Patient leans: N/A  Psychiatric Specialty Exam:  Presentation  General Appearance:  Appropriate for Environment  Eye Contact: Fair  Speech: Clear and Coherent  Speech Volume: Normal  Handedness: Right   Mood and Affect  Mood: Dysphoric; Irritable  Affect: Appropriate   Thought Process  Thought Processes: Coherent  Descriptions of Associations:Intact  Orientation:Full (Time, Place and Person)  Thought Content:Logical  History of Schizophrenia/Schizoaffective disorder:No  Duration of Psychotic Symptoms:Greater than six months  Hallucinations:Hallucinations: None Description of Auditory Hallucinations: Patient reports hearing voices but does not appear to be responding to internal stimuli or disorganized  Ideas of Reference:None  Suicidal Thoughts:Suicidal Thoughts: No SI Active Intent and/or Plan: With Plan  Homicidal Thoughts:Homicidal Thoughts: No   Sensorium  Memory: Immediate Fair  Judgment: Fair  Insight: Fair   Art therapist  Concentration: Fair  Attention Span: Fair  Recall: [employer] of Knowledge: Fair  Language: Fair   Psychomotor Activity  Psychomotor Activity: Psychomotor Activity: Normal   Assets  Assets: Communication Skills   Sleep  Sleep: Sleep: Fair    Physical Exam: Physical Exam ROS Blood pressure 119/89, pulse 94, temperature 98.3 F (36.8 C), temperature source Oral, resp. rate 16, height 5\' 2"  (1.575 m), weight 57.2 kg, SpO2 100%. Body mass index is 23.05 kg/m.   Treatment Plan Summary: 33 y.o. male  never married African-American male with a self-reported history of  self reported " schizophrenia, bipolar with psychotic  features", polysubstance abuse (methamphetamine, cocaine, marijuana), and documented history of MDD and substance induced mood disorder and psychosis as well as medical history of AIDS, neurosyphilis, noncompliance with medications.   4/9: Patient has been abusing cocaine and cannabis with poor sleep and states he hears multiple voices non command in nature today.  Vague historian as to whether he has been taking medications recently or not but states he has nowhere to stay and no transportation so has not been able to followup with outpatient services and has not been taking his medications consistently. Patient claims that he has tried "every antipsychotic and none of them work." This is inconsistent with prior presentation to consult service where patient is documented as having had a positive respond to Seroquel. Patient also appears clear, linear and goal directed and does not appear to be responding to internal stimuli but does appear depressed. Patient does appear dysphoric and reports SI with plan to overdose but denies intent.  Patient recently treated for neurosyphilis and has been poorly compliant with HIV medications. I contacted ARNP Dixon his outpatient provider and will restart Atovaquone (patient refusing bactrim due to recent AKI where consult ID advised against) as well as Bictarvy.  Patient reports prior benefit from Rexuli although overall poor and vague historian stating "I've tried every medication" but can only name a few. I reached out to his outpatient provider Flossie Dibble who reported benefit from Seroquel.   4/10: Patient reports SI with plan however is future oriented asking for assistance with sober living, inpatient rehab or ALF. Patient has been unable to followup regularly to manage his HIV, syphilis and has been abusing cocaine and cannabis. Expresses interest in sobriety. Does not appear psychotic as previously documented and is not responding to internal stimuli.    Daily  contact with patient to assess and evaluate symptoms and progress in treatment, Medication management, and Plan:  -cont Seroquel XR 200 mg at bedtime which patient has historically had a positive response to per documentation  -cont Rexulti 10 mg qdaily as patient reports possible prior benefit Observation Level/Precautions:  15 minute checks  Laboratory:  CBC Chemistry Profile UDS UA Vitamin B-12  Psychotherapy:  will attend group therapy  Medications:  Seroquel and Rexulti  Consultations:  curbside consult with ID (ordered CD4 counts as well as HIV testing)  Discharge Concerns:  needs residential rehab or sober living and stable living environment as well as likely ACTS services  Estimated LOS:5-7 days  Other:      Physician Treatment Plan for Primary Diagnosis: Substance-induced psychotic disorder (HCC) Long Term Goal(s): Improvement in symptoms so as ready for discharge   Short Term Goals: Ability to identify changes in lifestyle to reduce recurrence of condition will improve, Ability to verbalize feelings will improve, Ability to disclose and discuss suicidal ideas, Ability to demonstrate self-control will improve, Ability to identify and develop effective coping  behaviors will improve, Ability to maintain clinical measurements within normal limits will improve, Compliance with prescribed medications will improve, and Ability to identify triggers associated with substance abuse/mental health issues will improve   Physician Treatment Plan for Secondary Diagnosis: Principal Problem:   Substance-induced psychotic disorder (HCC) Active Problems:   MDD (major depressive disorder), recurrent episode, moderate (HCC)   Long Term Goal(s): Improvement in symptoms so as ready for discharge   Short Term Goals: Ability to identify changes in lifestyle to reduce recurrence of condition will improve, Ability to verbalize feelings will improve, Ability to disclose and discuss suicidal ideas, Ability  to demonstrate self-control will improve, Ability to identify and develop effective coping behaviors will improve, Ability to maintain clinical measurements within normal limits will improve, and Compliance with prescribed medications will improve   I certify that inpatient services furnished can reasonably be expected to improve the patient's condition.       Miguel Rota, MD 09/08/2023, 3:00 PM

## 2023-09-08 NOTE — [facility] Group Notes (Signed)
 [facility] Group Notes:  (Nursing/MHT/Case Management/Adjunct)  Date:  09/08/2023  Time: 2000  Type of Therapy:   Wrap up group  Participation Level:  Active  Participation Quality:  Appropriate, Attentive, Sharing, and Supportive  Affect:  Appropriate  Cognitive:  Alert  Insight:  Improving  Engagement in Group:  Engaged  Modes of Intervention:  Clarification, Education, and Socialization  Summary of Progress/Problems: Positive thinking and self-care were discussed.   Marcille Buffy 09/08/2023, 8:56 PM
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
## 2023-09-08 NOTE — Plan of Care (Signed)

## 2023-09-09 LAB — T-HELPER CELLS (CD4) COUNT (NOT AT ARMC)
CD4 % Helper T Cell: 12 % — ABNORMAL LOW (ref 33–65)
CD4 T Cell Abs: 258 /uL — ABNORMAL LOW (ref 400–1790)

## 2023-09-09 MED ORDER — QUETIAPINE FUMARATE ER 300 MG PO TB24
300.0000 mg | ORAL_TABLET | Freq: Every day | ORAL | Status: DC
  Administered 2023-09-10 – 2023-09-12 (×3): 300 mg via ORAL
  Filled 2023-09-09 (×5): qty 1

## 2023-09-09 NOTE — [facility] Counselor (Signed)
 CSW emailed requested documentation regarding pt's diagnoses to [email or web address] per [facility]'s request.   Reynaldo Minium, MSW, [facility] 09/09/2023 12:35 PM

## 2023-09-09 NOTE — Progress Notes (Signed)
Pt attended AA group and actively participated.  

## 2023-09-09 NOTE — Progress Notes (Signed)
   09/09/23 0830  Psych Admission Type (Psych Patients Only)  Admission Status Voluntary  Psychosocial Assessment  Patient Complaints Worrying;Anxiety  Eye Contact Fair  Facial Expression Animated  Affect Appropriate to circumstance  Speech Logical/coherent  Interaction Assertive  Motor Activity Other (Comment) (WNL)  Appearance/Hygiene Unremarkable  Behavior Characteristics Cooperative  Mood Pleasant  Thought Process  Coherency WDL  Content WDL  Delusions None reported or observed  Perception WDL  Hallucination None reported or observed  Judgment Poor  Confusion None  Danger to Self  Current suicidal ideation? Denies  Description of Suicide Plan none  Agreement Not to Harm Self Yes  Description of Agreement verba  Danger to Others  Danger to Others None reported or observed

## 2023-09-09 NOTE — [facility] Counselor (Signed)
 CSW provided housing resources for [employer] ([place], [place], [place], [place]) as well as [facility] cytogenetic[facility].   Reynaldo Minium, MSW, [facility] 09/09/2023 10:24 AM

## 2023-09-09 NOTE — [facility] Suicide Risk Assessment (Signed)
 [facility] INPATIENT:  Family/Significant Other Suicide Prevention Education  Suicide Prevention Education:  Patient Refusal for Family/Significant Other Suicide Prevention Education: The patient Lee Bridges has refused to provide written consent for family/significant other to be provided Family/Significant Other Suicide Prevention Education during admission and/or prior to discharge.  Physician notified.  Elza Rafter 09/09/2023, 2:48 PM

## 2023-09-09 NOTE — Group Note (Signed)
 Date:  09/09/2023 Time:  8:43 AM  Group Topic/Focus:  Emotional Education:   The focus of this group is to discuss what feelings/emotions are, and how they are experienced.    Participation Level:  Did Not Attend   Erasmo Score 09/09/2023, 8:43 AM

## 2023-09-09 NOTE — Progress Notes (Addendum)
 [facility] MD Progress Note  09/09/2023 2:02 PM Lee Bridges  MRN:  [id number] Subjective:    Patient had verbal outburst this morning due to feeling that "y'all aren't helping me I need rehab and a place to stay so I Courtez't do drugs out there." I discussed with case management and patient was provided a list of sober living houses. Discussed residential rehab as well and patient states she is motivated. Discussed with nursing staff and patient has been denying SI, HI or AVH today although when patiient had verbal outburst this morning he reported "I might as well just kill myself if y'all aren't going to help me." Patient has been labile and irritable at time however later in the afternoon, euthymic and grateful and states he has been calling sober living houses. Denies side effects from medication to me. Denies any suicidal plan or intent on the unit although continues to report hopelessness about his situation.  States he does not have money and many of the sober living houses require deposit. Patient was also asking for a list of places to call to get a job. Denies any side effects from current medications. Patient reported SI without a plan when irritable this morning but denies in afternoon. Does not appear to be responding to internal stimuli. Patient states he is interested in residential rehab or sobering living as well as ACTs services. Patient is future oriented    Principal Problem: Substance-induced psychotic disorder (HCC) Diagnosis: Principal Problem:   Substance-induced psychotic disorder (HCC) Active Problems:   MDD (major depressive disorder), recurrent episode, moderate (HCC)  Total Time spent with patient: 30 minutes  Past Psychiatric History:   Past Medical History:  Past Medical History:  Diagnosis Date   Depression    HIV (human immunodeficiency virus infection) (HCC)    Psychosis (HCC)    Substance abuse (HCC)    History reviewed. No pertinent surgical history. Family  History:  Family History  Problem Relation Age of Onset   Arthritis Mother    Diabetes Father    Hypertension Father    Family Psychiatric  History: denies Social History:  Social History   Substance and Sexual Activity  Alcohol Use Not Currently   Comment: socially     Social History   Substance and Sexual Activity  Drug Use Yes   Types: Cocaine, Marijuana, Methamphetamines   Comment: weekly    Social History   Socioeconomic History   Marital status: Single    Spouse name: Not on file   Number of children: 0   Years of education: Not on file   Highest education level: Associate degree: academic program  Occupational History   Not on file  Tobacco Use   Smoking status: Every Day    Current packs/day: 0.50    Types: Cigarettes   Smokeless tobacco: Never   Tobacco comments:    1 PPD  Substance and Sexual Activity   Alcohol use: Not Currently    Comment: socially   Drug use: Yes    Types: Cocaine, Marijuana, Methamphetamines    Comment: weekly   Sexual activity: Not Currently  Other Topics Concern   Not on file  Social History Narrative   Homeless x 2 yrs.    Social Drivers of Corporate investment banker Strain: Not on file  Food Insecurity: Food Insecurity Present (09/06/2023)   Hunger Vital Sign    Worried About Running Out of Food in the Last Year: Often true    Ran Out  of Food in the Last Year: Often true  Transportation Needs: Unmet Transportation Needs (09/06/2023)   PRAPARE - Administrator, Civil Service (Medical): Yes    Lack of Transportation (Non-Medical): Yes  Physical Activity: Not on file  Stress: Not on file  Social Connections: Socially Isolated (08/09/2023)   Social Connection and Isolation Panel [NHANES]    Frequency of Communication with Friends and Family: Never    Frequency of Social Gatherings with Friends and Family: Never    Attends Religious Services: Never    Database administrator or Organizations: No    Attends Probation officer: Never    Marital Status: Never married   Additional Social History:                         Sleep: Fair  Appetite:  Fair  Current Medications: Current Facility-Administered Medications  Medication Dose Route Frequency Provider Last Rate Last Admin   acetaminophen (TYLENOL) tablet 650 mg  650 mg Oral Q6H PRN Roise Cleaver, NP   650 mg at 09/08/23 1302   alum & mag hydroxide-simeth (MAALOX/MYLANTA) 200-200-20 MG/5ML suspension 30 mL  30 mL Oral Q4H PRN Roise Cleaver, NP   30 mL at 09/09/23 1114   atovaquone (MEPRON) 750 MG/5ML suspension 1,500 mg  1,500 mg Oral Q breakfast Korbin Mapps, MD   1,500 mg at 09/09/23 0959   bictegravir-emtricitabine-tenofovir AF (BIKTARVY) 50-200-25 MG per tablet 1 tablet  1 tablet Oral Daily Orson Blalock, NP   1 tablet at 09/09/23 0745   haloperidol (HALDOL) tablet 5 mg  5 mg Oral TID PRN Roise Cleaver, NP   5 mg at 09/06/23 2015   And   diphenhydrAMINE (BENADRYL) capsule 50 mg  50 mg Oral TID PRN Roise Cleaver, NP       haloperidol lactate (HALDOL) injection 5 mg  5 mg Intramuscular TID PRN Roise Cleaver, NP       And   diphenhydrAMINE (BENADRYL) injection 50 mg  50 mg Intramuscular TID PRN Roise Cleaver, NP       And   LORazepam (ATIVAN) injection 2 mg  2 mg Intramuscular TID PRN Roise Cleaver, NP       haloperidol lactate (HALDOL) injection 10 mg  10 mg Intramuscular TID PRN Roise Cleaver, NP       And   diphenhydrAMINE (BENADRYL) injection 50 mg  50 mg Intramuscular TID PRN Roise Cleaver, NP       And   LORazepam (ATIVAN) injection 2 mg  2 mg Intramuscular TID PRN Roise Cleaver, NP       hydrOXYzine (ATARAX) tablet 25 mg  25 mg Oral TID PRN Roise Cleaver, NP       magnesium hydroxide (MILK OF MAGNESIA) suspension 30 mL  30 mL Oral Daily PRN Roise Cleaver, NP       methocarbamol (ROBAXIN) tablet 500 mg  500 mg Oral Q6H PRN Meranda Dechaine, MD   500 mg at 09/08/23 2059    nicotine (NICODERM CQ - dosed in mg/24 hours) patch 14 mg  14 mg Transdermal Daily Tresha Muzio, MD   14 mg at 09/09/23 0744   QUEtiapine (SEROQUEL XR) 24 hr tablet 300 mg  300 mg Oral QHS Jolleen Seman, MD       vortioxetine HBr (TRINTELLIX) tablet 10 mg  10 mg Oral Daily Jaeli Grubb, MD   10 mg at 09/09/23 0744    Lab Results:  Results for  orders placed or performed during the hospital encounter of 09/06/23 (from the past 48 hours)  CBC with Differential/Platelet     Status: Abnormal   Collection Time: 09/08/23  6:23 AM  Result Value Ref Range   WBC 3.5 (L) 4.0 - 10.5 K/uL   RBC 3.24 (L) 4.22 - 5.81 MIL/uL   Hemoglobin 11.5 (L) 13.0 - 17.0 g/dL   HCT 08.6 (L) 57.8 - 46.9 %   MCV 105.6 (H) 80.0 - 100.0 fL   MCH 35.5 (H) 26.0 - 34.0 pg   MCHC 33.6 30.0 - 36.0 g/dL   RDW 62.9 52.8 - 41.3 %   Platelets 245 150 - 400 K/uL   nRBC 0.0 0.0 - 0.2 %   Neutrophils Relative % 30 %   Neutro Abs 1.1 (L) 1.7 - 7.7 K/uL   Lymphocytes Relative 39 %   Lymphs Abs 1.4 0.7 - 4.0 K/uL   Monocytes Relative 15 %   Monocytes Absolute 0.5 0.1 - 1.0 K/uL   Eosinophils Relative 14 %   Eosinophils Absolute 0.5 0.0 - 0.5 K/uL   Basophils Relative 1 %   Basophils Absolute 0.1 0.0 - 0.1 K/uL   Immature Granulocytes 1 %   Abs Immature Granulocytes 0.03 0.00 - 0.07 K/uL    Comment: Performed at [facility], [address] [place], [place] [address]  Comprehensive metabolic panel     Status: Abnormal   Collection Time: 09/08/23  6:23 AM  Result Value Ref Range   Sodium 139 135 - 145 mmol/L   Potassium 3.5 3.5 - 5.1 mmol/L   Chloride 109 98 - 111 mmol/L   CO2 22 22 - 32 mmol/L   Glucose, Bld 92 70 - 99 mg/dL    Comment: Glucose reference range applies only to samples taken after fasting for at least 8 hours.   BUN 10 6 - 20 mg/dL   Creatinine, Ser 0.27 0.61 - 1.24 mg/dL   Calcium 9.0 8.9 - 25.3 mg/dL   Total Protein 6.4 (L) 6.5 - 8.1 g/dL   Albumin 2.9 (L) 3.5 - 5.0 g/dL   AST 17  15 - 41 U/L   ALT 16 0 - 44 U/L   Alkaline Phosphatase 46 38 - 126 U/L   Total Bilirubin 0.4 0.0 - 1.2 mg/dL   GFR, Estimated >66 >44 mL/min    Comment: (NOTE) Calculated using the CKD-EPI Creatinine Equation (2021)    Anion gap 8 5 - 15    Comment: Performed at [facility], [address] [place], [place] [address]  T-helper cells (CD4) count (not at [facility])     Status: Abnormal   Collection Time: 09/08/23  6:28 PM  Result Value Ref Range   CD4 T Cell Abs 258 (L) 400 - 1,790 /uL   CD4 % Helper T Cell 12 (L) 33 - 65 %    Comment: Performed at [facility], [address], [place], [place] [address]    Blood Alcohol level:  Lab Results  Component Value Date   [facility] <10 09/05/2023   ETH <10 08/08/2023    Metabolic Disorder Labs: Lab Results  Component Value Date   HGBA1C 4.6 (L) 08/12/2023   MPG 85.32 08/12/2023   No results found for: "PROLACTIN" Lab Results  Component Value Date   CHOL 167 08/12/2023   TRIG 239 (H) 08/12/2023   HDL 30 (L) 08/12/2023   CHOLHDL 5.6 08/12/2023   VLDL 48 (H) 08/12/2023   LDLCALC  89 08/12/2023     Musculoskeletal: Strength & Muscle Tone: within normal limits Gait & Station: normal Patient leans: N/A  Psychiatric Specialty Exam:  Presentation  General Appearance:  Appropriate for Environment  Eye Contact: Fair  Speech: Clear and Coherent  Speech Volume: Normal  Handedness: Right   Mood and Affect  Mood: Dysphoric; Irritable  Affect: Appropriate   Thought Process  Thought Processes: Coherent  Descriptions of Associations:Intact  Orientation:Full (Time, Place and Person)  Thought Content:Logical  History of Schizophrenia/Schizoaffective disorder:No  Duration of Psychotic Symptoms:Greater than six months  Hallucinations:Hallucinations: None Description of Auditory Hallucinations: Patient reports hearing voices but does not appear to be responding to internal stimuli  or disorganized  Ideas of Reference:None  Suicidal Thoughts:SI without a plan  Homicidal Thoughts:Homicidal Thoughts: No   Sensorium  Memory: Immediate Fair  Judgment: Fair  Insight: Fair   Art therapist  Concentration: Fair  Attention Span: Fair  Recall: [employer] of Knowledge: Fair  Language: Fair   Psychomotor Activity  Psychomotor Activity: Psychomotor Activity: Normal   Assets  Assets: Communication Skills   Sleep  Sleep: Sleep: Fair    Physical Exam: Physical exam: Please see exam on admit note. General: Well developed, well nourished.  Pupils: Normal at 3mm Respiratory: Breathing is unlabored.  Cardiovascular: No edema.  Language: No anomia, no aphasia Muscle strength and tone-pt moving all extremities.  Gait not assessed as pt remained in bed.  Neuro: Facial muscles are symmetric. Pt without tremor, no evidence of hyperarousal.  Review of Systems  Constitutional: Negative.   HENT: Negative.    Eyes: Negative.   Respiratory: Negative.    Cardiovascular: Negative.   Gastrointestinal: Negative.   Genitourinary: Negative.   Musculoskeletal: Negative.   Skin: Negative.   Neurological:  Positive for tingling.  Endo/Heme/Allergies: Negative.   Psychiatric/Behavioral:  Positive for depression and suicidal ideas.    Blood pressure (!) 141/82, pulse 88, temperature 98.3 F (36.8 C), temperature source Oral, resp. rate 16, height 5\' 2"  (1.575 m), weight 57.2 kg, SpO2 99%. Body mass index is 23.05 kg/m.   Treatment Plan Summary: 33 y.o. male  never married African-American male with a self-reported history of  self reported " schizophrenia, bipolar with psychotic features", polysubstance abuse (methamphetamine, cocaine, marijuana), and documented history of MDD and substance induced mood disorder and psychosis as well as medical history of AIDS, neurosyphilis, noncompliance with medications.   4/9: Patient has been abusing  cocaine and cannabis with poor sleep and states he hears multiple voices non command in nature today.  Vague historian as to whether he has been taking medications recently or not but states he has nowhere to stay and no transportation so has not been able to followup with outpatient services and has not been taking his medications consistently. Patient claims that he has tried "every antipsychotic and none of them work." This is inconsistent with prior presentation to consult service where patient is documented as having had a positive respond to Seroquel. Patient also appears clear, linear and goal directed and does not appear to be responding to internal stimuli but does appear depressed. Patient does appear dysphoric and reports SI with plan to overdose but denies intent.  Patient recently treated for neurosyphilis and has been poorly compliant with HIV medications. I contacted ARNP Dixon his outpatient provider and will restart Atovaquone (patient refusing bactrim due to recent AKI where consult ID advised against) as well as Bictarvy.  Patient reports prior benefit from [facility] although overall poor  and vague historian stating "I've tried every medication" but can only name a few. I reached out to his outpatient provider Chapman Commodore who reported benefit from Seroquel.   4/10: Patient reports SI with plan however is future oriented asking for assistance with sober living, inpatient rehab or ALF. Patient has been unable to followup regularly to manage his HIV, syphilis and has been abusing cocaine and cannabis. Expresses interest in sobriety. Does not appear psychotic as previously documented and is not responding to internal stimuli.   4/11: 4/11: patient with behavioral outburst in the morning and was threatening to kill himself if he doesn't get help with rehab services. Did appear motivated later in afternoon    Daily contact with patient to assess and evaluate symptoms and progress in treatment,  Medication management, and Plan:  -cont Seroquel XR 200 mg at bedtime which patient has historically had a positive response to per documentation  -cont Rexulti 10 mg qdaily as patient reports possible prior benefit Observation Level/Precautions:  15 minute checks  Laboratory:  CBC Chemistry Profile UDS UA Vitamin B-12  Psychotherapy:  will attend group therapy  Medications:  Seroquel and Rexulti  Consultations:  curbside consult with ID (ordered CD4 counts as well as HIV testing)  Discharge Concerns:  needs residential rehab or sober living and stable living environment as well as likely ACTS services  Estimated LOS:5-7 days  Other:       Medical Consult: curbside consult with outpatient provider Patient had CD4 drawn but not RPR or HIV-RNA so I reordered labs and will followup Vital signs stable  See noted from 4/10 with following recs: "In review of his chart and records I Kaylin't feel strongly he needs an additional dose of the bicillin as his infection appears to be early between July 2024 and October 2024.    I will repeat a RPR, CD4 and HIV RNA level (last one was 196,000 copies in February).    Continue biktarvy once daily, regarding pneumocystis prophylaxis he can continue either Bactrim 1 SS daily or Atovaquone 1500 mg daily. His creatinine is normal but Faheem does not want to "shut down his kidneys" per what he recalls from previous [facility] ID team."   Physician Treatment Plan for Primary Diagnosis: Substance-induced psychotic disorder (HCC) Long Term Goal(s): Improvement in symptoms so as ready for discharge   Short Term Goals: Ability to identify changes in lifestyle to reduce recurrence of condition will improve, Ability to verbalize feelings will improve, Ability to disclose and discuss suicidal ideas, Ability to demonstrate self-control will improve, Ability to identify and develop effective coping behaviors will improve, Ability to maintain clinical measurements within  normal limits will improve, Compliance with prescribed medications will improve, and Ability to identify triggers associated with substance abuse/mental health issues will improve   Physician Treatment Plan for Secondary Diagnosis: Principal Problem:   Substance-induced psychotic disorder (HCC) Active Problems:   MDD (major depressive disorder), recurrent episode, moderate (HCC)   Long Term Goal(s): Improvement in symptoms so as ready for discharge   Short Term Goals: Ability to identify changes in lifestyle to reduce recurrence of condition will improve, Ability to verbalize feelings will improve, Ability to disclose and discuss suicidal ideas, Ability to demonstrate self-control will improve, Ability to identify and develop effective coping behaviors will improve, Ability to maintain clinical measurements within normal limits will improve, and Compliance with prescribed medications will improve   I certify that inpatient services furnished can reasonably be expected to improve the patient's condition.  Kinston Magnan, MD 09/09/2023, 2:02 PM

## 2023-09-09 NOTE — Plan of Care (Signed)
  Problem: Education: Goal: Knowledge of [place] [facility] Education information/materials will improve Outcome: Completed/Met Goal: Emotional status will improve Outcome: Progressing Goal: Mental status will improve Outcome: Progressing Goal: Verbalization of understanding the information provided will improve Outcome: Progressing

## 2023-09-09 NOTE — Group Note (Signed)
 Recreation Therapy Group Note   Group Topic:Leisure Education  Group Date: 09/09/2023 Start Time: 0930 End Time: 0955 Facilitators: Elenie Coven-McCall, LRT,CTRS Location: [address] Dayroom   Group Topic: Leisure Education  Goal Area(s) Addresses:  Patient will identify positive leisure activities for use post discharge. Patient will identify at least one positive benefit of participation in leisure activities.  Patient will work effectively work with peers in making sure activity goes as planned.  Intervention: [facility]   Activity: Patients were placed in a circle in the dayroom and given a beach ball. Patients were to play a game where they were to keep the ball moving at all times. LRT would time the group as they play the game. If the ball were to come to a stop, the time would start over.  Education:  Leisure Scientist, physiological, Special educational needs teacher, Teamwork, Discharge Planning  Education Outcome: Acknowledges education/In group clarification offered/Needs additional education.    Affect/Mood: Flat   Participation Level: None   Participation Quality: Independent   Behavior: On-looking   Speech/Thought Process: None   Insight: None   Judgement: None   Modes of Intervention: Cooperative Play   Patient Response to Interventions:  Disengaged   Education Outcome:  In group clarification offered    Clinical Observations/Individualized Feedback: Pt didn't participate in activity. Pt appeared to be upset. Pt stayed and observed for a while before leaving and not returning.    Plan: Continue to engage patient in RT group sessions 2-3x/week.   Josepha Barbier-McCall, LRT,CTRS 09/09/2023 12:21 PM

## 2023-09-09 NOTE — B[facility] Counselor (Signed)
 CSW contacted [facility] per pt's request.   [facility] reports pt cannot go because he is Schizophrenic.   CSW reports that there is no diagnosis of Schizophrenia in the chart.   [facility] reports CSW needs to send proof of diagnoses to [email or web address] Haste, MSW, [facility] 09/09/2023 11:19 AM

## 2023-09-10 LAB — RPR
RPR Ser Ql: REACTIVE — AB
RPR Titer: 1:32 {titer}

## 2023-09-10 LAB — HIV-1 RNA QUANT-NO REFLEX-BLD
HIV 1 RNA Quant: 6410 {copies}/mL
LOG10 HIV-1 RNA: 3.807 {Log_copies}/mL

## 2023-09-10 MED ORDER — DIPHENHYDRAMINE-ZINC ACETATE 2-0.1 % EX CREA: TOPICAL_CREAM | Freq: Two times a day (BID) | CUTANEOUS | Status: DC | PRN

## 2023-09-10 NOTE — Progress Notes (Signed)
   09/09/23 2200  Psych Admission Type (Psych Patients Only)  Admission Status Voluntary  Psychosocial Assessment  Patient Complaints Apathy;Irritability  Eye Contact Fair  Facial Expression Animated  Affect Irritable  Speech Logical/coherent  Interaction Assertive  Motor Activity Other (Comment) (WDL)  Appearance/Hygiene Unremarkable  Behavior Characteristics Irritable  Mood Irritable  Thought Process  Coherency WDL  Content WDL  Delusions None reported or observed  Perception WDL  Hallucination None reported or observed  Judgment Poor  Confusion None  Danger to Self  Current suicidal ideation? Denies  Danger to Others  Danger to Others None reported or observed

## 2023-09-10 NOTE — Plan of Care (Signed)
  Problem: Education: Goal: Emotional status will improve Outcome: Progressing Goal: Mental status will improve Outcome: Progressing Goal: Verbalization of understanding the information provided will improve Outcome: Progressing   Problem: Activity: Goal: Interest or engagement in activities will improve Outcome: Progressing   Problem: Coping: Goal: Ability to verbalize frustrations and anger appropriately will improve Outcome: Not Progressing

## 2023-09-10 NOTE — Progress Notes (Signed)
 Pt refused bedtime medication. Pt stated " [name]'t want to take meds and be in that room burning up all night." Pt was verbalizing how upset he is that his roommate has had the air conditioner above 80 degrees on "Heat" since he got admitted. He stated he has told several staff but the problem had not been addressed. He was irritable during bedtime, and sat on the bedroom floor.   I was able to speak to the patient's roommate and he agreed to change the temperature to cool.

## 2023-09-10 NOTE — Group Note (Signed)
 Date:  09/10/2023 Time:  2:07 PM  Group Topic/Focus:  Wellness Toolbox:   The focus of this group is to discuss various aspects of wellness, balancing those aspects and exploring ways to increase the ability to experience wellness.  Patients will create a wellness toolbox for use upon discharge.    Participation Level:  Active  Participation Quality:  Appropriate  Affect:  Appropriate  Lee Bridges 09/10/2023, 2:07 PM

## 2023-09-10 NOTE — Group Note (Signed)
 Date:  09/10/2023 Time:  9:12 PM  Group Topic/Focus:  Wrap-Up Group:   The focus of this group is to help patients review their daily goal of treatment and discuss progress on daily workbooks.    Participation Level:  Active  Participation Quality:  Appropriate  Affect:  Appropriate  Cognitive:  Appropriate  Insight: Appropriate  Engagement in Group:  Engaged  Modes of Intervention:  Socialization  Additional Comments:  Pt attended the evening wrap-up group. Tech introduced the staff for the evening, reminded group of the evening schedule and reminded them to ask for anything they need. PT played a game to support memory and socialization. Pt contributed great answers while encouraging other pts during game.  Carmon Christen 09/10/2023, 9:12 PM

## 2023-09-10 NOTE — Plan of Care (Signed)
   Problem: Education: Goal: Emotional status will improve Outcome: Progressing Goal: Mental status will improve Outcome: Progressing Goal: Verbalization of understanding the information provided will improve Outcome: Progressing

## 2023-09-10 NOTE — Progress Notes (Addendum)
 [facility] MD Progress Note  09/10/2023 2:55 PM Lee Bridges  MRN:  [id number] Subjective:    Patient had verbal outburst this morning again threatening to kill his roommate due to his roommmate turning up the temperature to 90 degrees the last 3 days (this was indeed true when I had followed up the past 3 days and we had both requested with nursing staff if there is a way to accommodate a room change. Patient was moved to 500 hall due to threatening patient and at time of my evaluation is calm, cooperative and denies SI or HI. Per staff patient additionally refused Seroquel last night and was irritable.    Patient is future oriented on inpatient rehab or sober living and has made multiple calls yesterday and today. Reports AVH however has not appeared to be responding to internal stimuli throughout admission, additionally very organized, linear and goal directed which is incongruent to schizophrenia. Patient was paranoid and irritable on initial presentaiton to hospital which was consistent with his recent cocaine abuse. Denies any side effects from current medications.  Principal Problem: Substance-induced psychotic disorder (HCC) Diagnosis: Principal Problem:   Substance-induced psychotic disorder (HCC) Active Problems:   MDD (major depressive disorder), recurrent episode, moderate (HCC)  Total Time spent with patient: 30 minutes  Past Psychiatric History:   Past Medical History:  Past Medical History:  Diagnosis Date   Depression    HIV (human immunodeficiency virus infection) (HCC)    Psychosis (HCC)    Substance abuse (HCC)    History reviewed. No pertinent surgical history. Family History:  Family History  Problem Relation Age of Onset   Arthritis Mother    Diabetes Father    Hypertension Father    Family Psychiatric  History: denies Social History:  Social History   Substance and Sexual Activity  Alcohol Use Not Currently   Comment: socially     Social History    Substance and Sexual Activity  Drug Use Yes   Types: Cocaine, Marijuana, Methamphetamines   Comment: weekly    Social History   Socioeconomic History   Marital status: Single    Spouse name: Not on file   Number of children: 0   Years of education: Not on file   Highest education level: Associate degree: academic program  Occupational History   Not on file  Tobacco Use   Smoking status: Every Day    Current packs/day: 0.50    Types: Cigarettes   Smokeless tobacco: Never   Tobacco comments:    1 PPD  Substance and Sexual Activity   Alcohol use: Not Currently    Comment: socially   Drug use: Yes    Types: Cocaine, Marijuana, Methamphetamines    Comment: weekly   Sexual activity: Not Currently  Other Topics Concern   Not on file  Social History Narrative   Homeless x 2 yrs.    Social Drivers of Corporate investment banker Strain: Not on file  Food Insecurity: Food Insecurity Present (09/06/2023)   Hunger Vital Sign    Worried About Running Out of Food in the Last Year: Often true    Ran Out of Food in the Last Year: Often true  Transportation Needs: Unmet Transportation Needs (09/06/2023)   PRAPARE - Administrator, Civil Service (Medical): Yes    Lack of Transportation (Non-Medical): Yes  Physical Activity: Not on file  Stress: Not on file  Social Connections: Socially Isolated (08/09/2023)   Social Connection and Isolation Panel [  NHANES]    Frequency of Communication with Friends and Family: Never    Frequency of Social Gatherings with Friends and Family: Never    Attends Religious Services: Never    Database administrator or Organizations: No    Attends Engineer, structural: Never    Marital Status: Never married   Additional Social History:                         Sleep: Fair  Appetite:  Fair  Current Medications: Current Facility-Administered Medications  Medication Dose Route Frequency Provider Last Rate Last Admin    acetaminophen (TYLENOL) tablet 650 mg  650 mg Oral Q6H PRN Roise Cleaver, NP   650 mg at 09/08/23 1302   alum & mag hydroxide-simeth (MAALOX/MYLANTA) 200-200-20 MG/5ML suspension 30 mL  30 mL Oral Q4H PRN Roise Cleaver, NP   30 mL at 09/09/23 1114   atovaquone (MEPRON) 750 MG/5ML suspension 1,500 mg  1,500 mg Oral Q breakfast Christpher Stogsdill, MD   1,500 mg at 09/10/23 0813   bictegravir-emtricitabine-tenofovir AF (BIKTARVY) 50-200-25 MG per tablet 1 tablet  1 tablet Oral Daily Orson Blalock, NP   1 tablet at 09/10/23 0813   haloperidol (HALDOL) tablet 5 mg  5 mg Oral TID PRN Roise Cleaver, NP   5 mg at 09/06/23 2015   And   diphenhydrAMINE (BENADRYL) capsule 50 mg  50 mg Oral TID PRN Roise Cleaver, NP       haloperidol lactate (HALDOL) injection 5 mg  5 mg Intramuscular TID PRN Roise Cleaver, NP       And   diphenhydrAMINE (BENADRYL) injection 50 mg  50 mg Intramuscular TID PRN Roise Cleaver, NP       And   LORazepam (ATIVAN) injection 2 mg  2 mg Intramuscular TID PRN Roise Cleaver, NP       haloperidol lactate (HALDOL) injection 10 mg  10 mg Intramuscular TID PRN Roise Cleaver, NP       And   diphenhydrAMINE (BENADRYL) injection 50 mg  50 mg Intramuscular TID PRN Roise Cleaver, NP       And   LORazepam (ATIVAN) injection 2 mg  2 mg Intramuscular TID PRN Roise Cleaver, NP       hydrOXYzine (ATARAX) tablet 25 mg  25 mg Oral TID PRN Roise Cleaver, NP       magnesium hydroxide (MILK OF MAGNESIA) suspension 30 mL  30 mL Oral Daily PRN Roise Cleaver, NP       methocarbamol (ROBAXIN) tablet 500 mg  500 mg Oral Q6H PRN Jaidin Ugarte, MD   500 mg at 09/08/23 2059   nicotine (NICODERM CQ - dosed in mg/24 hours) patch 14 mg  14 mg Transdermal Daily Sunshine Mackowski, MD   14 mg at 09/10/23 3086   QUEtiapine (SEROQUEL XR) 24 hr tablet 300 mg  300 mg Oral QHS Indica Marcott, MD       vortioxetine HBr (TRINTELLIX) tablet 10 mg  10 mg Oral Daily Darrielle Pflieger, MD   10 mg at  09/10/23 0813    Lab Results:  Results for orders placed or performed during the hospital encounter of 09/06/23 (from the past 48 hours)  T-helper cells (CD4) count (not at [facility])     Status: Abnormal   Collection Time: 09/08/23  6:28 PM  Result Value Ref Range   CD4 T Cell Abs 258 (L) 400 - 1,790 /uL   CD4 % Helper T  Cell 12 (L) 33 - 65 %    Comment: Performed at [facility], [address] [place], [place] [address]  RPR     Status: Abnormal   Collection Time: 09/09/23  6:28 PM  Result Value Ref Range   RPR Ser Ql Reactive (A) NON REACTIVE    Comment: SENT FOR CONFIRMATION   RPR Titer 1:32     Comment: Performed at [facility], [address] [place], [place] [address]    Blood Alcohol level:  Lab Results  Component Value Date   [facility] <10 09/05/2023   ETH <10 08/08/2023    Metabolic Disorder Labs: Lab Results  Component Value Date   HGBA1C 4.6 (L) 08/12/2023   MPG 85.32 08/12/2023   No results found for: "PROLACTIN" Lab Results  Component Value Date   CHOL 167 08/12/2023   TRIG 239 (H) 08/12/2023   HDL 30 (L) 08/12/2023   CHOLHDL 5.6 08/12/2023   VLDL 48 (H) 08/12/2023   LDLCALC 89 08/12/2023     Musculoskeletal: Strength & Muscle Tone: within normal limits Gait & Station: normal Patient leans: N/A  Psychiatric Specialty Exam:  Presentation  General Appearance:  Appropriate for Environment  Eye Contact: Fair  Speech: Clear and Coherent  Speech Volume: Normal  Handedness: Right   Mood and Affect  Mood: Dysphoric; Irritable  Affect: Appropriate   Thought Process  Thought Processes: Coherent  Descriptions of Associations:Intact  Orientation:Full (Time, Place and Person)  Thought Content:Logical  History of Schizophrenia/Schizoaffective disorder:No  Duration of Psychotic Symptoms:Greater than six months  Hallucinations:patient reports AH of multiple voices noncommand in nature but has not been observed  to be RTIS  Ideas of Reference:None  Suicidal Thoughts:SI without a plan  Homicidal Thoughts:voice HI towards his roommate due to him turning up the heat this mornign but patient now denies   Sensorium  Memory: Immediate Fair  Judgment: Fair  Insight: Fair   Art therapist  Concentration: Fair  Attention Span: Fair  Recall: [employer] of Knowledge: Fair  Language: Fair   Psychomotor Activity  Psychomotor Activity: No data recorded   Assets  Assets: Communication Skills   Sleep  Sleep: fair    Physical Exam: Physical exam: Please see exam on admit note. General: Well developed, well nourished.  Pupils: Normal at 3mm Respiratory: Breathing is unlabored.  Cardiovascular: No edema.  Language: No anomia, no aphasia Muscle strength and tone-pt moving all extremities.  Gait not assessed as pt remained in bed.  Neuro: Facial muscles are symmetric. Pt without tremor, no evidence of hyperarousal.  Review of Systems  Constitutional: Negative.   HENT: Negative.    Eyes: Negative.   Respiratory: Negative.    Cardiovascular: Negative.   Gastrointestinal: Negative.   Genitourinary: Negative.   Musculoskeletal: Negative.   Skin: Negative.   Neurological:  Positive for tingling.  Endo/Heme/Allergies: Negative.   Psychiatric/Behavioral:  Positive for depression and suicidal ideas.    Blood pressure (!) 144/92, pulse 88, temperature 98 F (36.7 C), temperature source Oral, resp. rate 16, height 5\' 2"  (1.575 m), weight 57.2 kg, SpO2 99%. Body mass index is 23.05 kg/m.   Treatment Plan Summary: 33 y.o. male  never married African-American male with a self-reported history of  self reported " schizophrenia, bipolar with psychotic features", polysubstance abuse (methamphetamine, cocaine, marijuana), and documented history of MDD and substance induced mood disorder and psychosis as well as medical history of AIDS, neurosyphilis, noncompliance with  medications.   4/9: Patient  has been abusing cocaine and cannabis with poor sleep and states he hears multiple voices non command in nature today.  Vague historian as to whether he has been taking medications recently or not but states he has nowhere to stay and no transportation so has not been able to followup with outpatient services and has not been taking his medications consistently. Patient claims that he has tried "every antipsychotic and none of them work." This is inconsistent with prior presentation to consult service where patient is documented as having had a positive respond to Seroquel. Patient also appears clear, linear and goal directed and does not appear to be responding to internal stimuli but does appear depressed. Patient does appear dysphoric and reports SI with plan to overdose but denies intent.  Patient recently treated for neurosyphilis and has been poorly compliant with HIV medications. I contacted ARNP Dixon his outpatient provider and will restart Atovaquone (patient refusing bactrim due to recent AKI where consult ID advised against) as well as Bictarvy.  Patient reports prior benefit from Rexuli although overall poor and vague historian stating "I've tried every medication" but can only name a few. I reached out to his outpatient provider Chapman Commodore who reported benefit from Seroquel.   4/10: Patient reports SI with plan however is future oriented asking for assistance with sober living, inpatient rehab or ALF. Patient has been unable to followup regularly to manage his HIV, syphilis and has been abusing cocaine and cannabis. Expresses interest in sobriety. Does not appear psychotic as previously documented and is not responding to internal stimuli.  4/11: patient with behavioral outburst in the morning and was threatening to kill himself if he doesn't get help with rehab services. Did appear motivated later in afternoon.  4/12: patient continues to have mood lability and  had to be moved to 500 hall due to threatening to kill another patient; when I saw patient he is calm and cooperative and states he was upset about his room being hot, Patient additionally future oriented on getting a job and finding sober living. Patient has been upset about his roommate turning up the temperature to 90 degrees in the room the last 2 days. ON evaluation, patient irritable although easily redirectable. Claims he is having AVH however does not appear to be responding to internal stimuli, and patient linear and goal directed. Patient has been very demanding the last several days stating case management needs to find him a place to stay. Patient intermittently making suicidal statements although denies SI or HI at time of m y evaluation. Symptom are more consistent with MDD, substance induced mood disorder. Patient's thoughts, speech and organization as well as lack of delusions or RTIS are not consistent with schizophrenia or schizoaffective disorder although he does often present wot the hospital initially paranoid and irritable after abusing cocaine. Patient does seem motivated this time to maintain sobriety and find a sober living house and has been making calls even over the weekend to find sober living or inpatient rehab.   Patient was later observed in afternoon, placing basketball, interacting with peers and laughing and denied SI or HI today. States he did not take Seroquel last night due to being upset.   Daily contact with patient to assess and evaluate symptoms and progress in treatment, Medication management, and Plan:  -increase Seroquel XR to 300 mg at bedtime which patient has historically had a positive response to per documentation  -cont Rexulti 10 mg qdaily for MDD as patient reports possible  prior benefit Observation Level/Precautions:  15 minute checks  Laboratory:  CBC Chemistry Profile UDS UA Vitamin B-12  Psychotherapy:  will attend group therapy  Medications:   Seroquel and Rexulti  Consultations:  curbside consult with ID (ordered CD4 counts as well as HIV testing)  Discharge Concerns:  needs residential rehab or sober living and stable living environment as well as likely ACTS services  Estimated LOS:5-7 days  Other:       Medical Consult: curbside consult with outpatient provider Patient had CD4 drawn but not RPR or HIV-RNA so I reordered labs and will followup Vital signs stable  Reached out to [facility] regarding patient's lab results and will followup: CD4:258, RPR (reactive, titer 1:32); The HIV RNA lab looks like is still pending  See noted from 4/10 with following recs: "In review of his chart and records I Gorman't feel strongly he needs an additional dose of the bicillin as his infection appears to be early between July 2024 and October 2024.    I will repeat a RPR, CD4 and HIV RNA level (last one was 196,000 copies in February).    Continue biktarvy once daily, regarding pneumocystis prophylaxis he can continue either Bactrim 1 SS daily or Atovaquone 1500 mg daily. His creatinine is normal but Emeka does not want to "shut down his kidneys" per what he recalls from previous [facility] ID team."  4/12: curbsided ARNP dixon who notes: "the syphilis titer has already shown a > 4 fold reduction following the treatment he has already had so even more comfortable with no further bicillin. That was a bit of an overkill recommendation - which is OK to be over cautious sometimes.  His CD4 is better - I would say he should continue the atovaquone another 3 months then we can stop it IF he stays on his Biktarvy more consistently - which he needs to continue of course lifelong.  I will take a peak at the viral load Monday "     Physician Treatment Plan for Primary Diagnosis: Substance-induced psychotic disorder [facility]) Long Term Goal(s): Improvement in symptoms so as ready for discharge   Short Term Goals: Ability to identify changes in lifestyle to reduce  recurrence of condition will improve, Ability to verbalize feelings will improve, Ability to disclose and discuss suicidal ideas, Ability to demonstrate self-control will improve, Ability to identify and develop effective coping behaviors will improve, Ability to maintain clinical measurements within normal limits will improve, Compliance with prescribed medications will improve, and Ability to identify triggers associated with substance abuse/mental health issues will improve   Physician Treatment Plan for Secondary Diagnosis: Principal Problem:   Substance-induced psychotic disorder (HCC) Active Problems:   MDD (major depressive disorder), recurrent episode, moderate (HCC)   Long Term Goal(s): Improvement in symptoms so as ready for discharge   Short Term Goals: Ability to identify changes in lifestyle to reduce recurrence of condition will improve, Ability to verbalize feelings will improve, Ability to disclose and discuss suicidal ideas, Ability to demonstrate self-control will improve, Ability to identify and develop effective coping behaviors will improve, Ability to maintain clinical measurements within normal limits will improve, and Compliance with prescribed medications will improve   I certify that inpatient services furnished can reasonably be expected to improve the patient's condition.       Kaid Seeberger, MD 09/10/2023, 2:55 PM

## 2023-09-10 NOTE — Group Note (Signed)
 Date:  09/10/2023 Time:  9:28 AM  Group Topic/Focus:  Overcoming Stress:   The focus of this group is to define stress and help patients assess their triggers.    Participation Level:  Active  Participation Quality:  Appropriate  Affect:  Appropriate   Lee Bridges 09/10/2023, 9:28 AM

## 2023-09-10 NOTE — Progress Notes (Signed)
 Patient noted to be angry and irritable this AM cursing and threatening his roommate. Pt stated that he wanted the temperature cooler and that his roommate had the room temperature too warm. Pt asked and agreed to sit on [address] hall Dayroom until his roommate is discharged. Pt apologized to his roommate prior to him being discharged. No further outburst observed on shift. Safety maintained.  09/10/23 0930  Psych Admission Type (Psych Patients Only)  Admission Status Voluntary  Psychosocial Assessment  Patient Complaints Anger;Irritability  Eye Contact Fair  Facial Expression Angry  Affect Angry;Irritable  Speech Argumentative;Loud  Interaction Arrogant;Hostile  Motor Activity Other (Comment) (WNL)  Appearance/Hygiene Unremarkable  Behavior Characteristics Agitated;Irritable  Mood Angry;Irritable;Preoccupied  Thought Process  Coherency WDL  Content WDL  Delusions None reported or observed  Perception WDL  Hallucination None reported or observed  Judgment Poor  Confusion None  Danger to Self  Current suicidal ideation? Denies  Agreement Not to Harm Self Yes  Description of Agreement Verbal  Danger to Others  Danger to Others None reported or observed

## 2023-09-10 NOTE — Plan of Care (Signed)
  Problem: Activity: Goal: Interest or engagement in activities will improve Outcome: Progressing Goal: Sleeping patterns will improve Outcome: Progressing   Problem: Physical Regulation: Goal: Ability to maintain clinical measurements within normal limits will improve Outcome: Progressing   Problem: Safety: Goal: Periods of time without injury will increase Outcome: Progressing

## 2023-09-11 LAB — HIV-1 RNA QUANT-NO REFLEX-BLD
HIV 1 RNA Quant: 910 {copies}/mL
LOG10 HIV-1 RNA: 2.959 {Log_copies}/mL

## 2023-09-11 MED ORDER — NYSTATIN 100000 UNIT/GM EX CREA
TOPICAL_CREAM | Freq: Two times a day (BID) | CUTANEOUS | Status: DC
  Administered 2023-09-13 – 2023-09-19 (×5): 1 via TOPICAL
  Filled 2023-09-11: qty 30

## 2023-09-11 NOTE — Progress Notes (Signed)
   09/11/23 0900  Psychosocial Assessment  Patient Complaints Agitation  Eye Contact Fair  Facial Expression Animated  Affect Appropriate to circumstance  Speech Logical/coherent  Interaction Attention-seeking  Motor Activity Other (Comment) (WDL)  Appearance/Hygiene Unremarkable  Behavior Characteristics Cooperative  Mood Preoccupied  Thought Process  Coherency WDL  Content WDL  Delusions None reported or observed  Perception WDL  Hallucination None reported or observed  Judgment WDL  Confusion None  Danger to Self  Current suicidal ideation? Denies  Agreement Not to Harm Self Yes  Description of Agreement Verbal  Danger to Others  Danger to Others None reported or observed

## 2023-09-11 NOTE — Group Note (Signed)
 Date:  09/11/2023 Time:  9:01 AM  Group Topic/Focus:  Goals Group:   The focus of this group is to help patients establish daily goals to achieve during treatment and discuss how the patient can incorporate goal setting into their daily lives to aide in recovery. Orientation:   The focus of this group is to educate the patient on the purpose and policies of crisis stabilization and provide a format to answer questions about their admission.  The group details unit policies and expectations of patients while admitted.    Participation Level:  Did Not Attend

## 2023-09-11 NOTE — Plan of Care (Signed)
?  Problem: Education: ?Goal: Emotional status will improve ?Outcome: Progressing ?  ?Problem: Coping: ?Goal: Ability to verbalize frustrations and anger appropriately will improve ?Outcome: Progressing ?  ?Problem: Safety: ?Goal: Periods of time without injury will increase ?Outcome: Progressing ?  ?

## 2023-09-11 NOTE — Group Note (Signed)
 [facility] LCSW Group Therapy Note  @TD @  1:00-2:00  Type of Therapy and Topic:  Group Therapy:  Taking Good Care of Yourself  Participation Level:  Active   Description of Group:  Patients in this group were introduced to the idea of adding a variety of healthy supports to address the various needs in their lives.  Different types of support were defined and described, and each type was acted out.  Patients discussed what additional healthy supports could be helpful in their recovery and wellness after discharge in order to prevent future hospitalizations.   An emphasis was placed on following up with the discharge plan when they leave the hospital in order to continue becoming healthier and happier.    Therapeutic Goals: 1)  demonstrate the importance of self-care  2)  What the patient dose to take care of themself  3)  Identify the benefit of selfcare  4)  Understand that asking for help is okay...   Summary of Patient Progress:  The patient expressed his comprehension of the concepts presented, and stated that there is a need to add more supports.  Current supports include God.  The patient indicated one healthy support that could be helpful if added would be having the right staffs that are here to help and to not just listen.  He participated by engaging in group conversation and expressing his opinion about how he feels.  Therapeutic Modalities:   Psychoeducation Brief Solution-Focused Therapy

## 2023-09-11 NOTE — Progress Notes (Signed)
 [facility] MD Progress Note  09/11/2023 5:24 PM Lee Bridges  MRN:  [id number] Subjective:   Patient tolerating Seroquel dosing increase to 300 mg at bedtime and reports good sleep and appetite. Denies side effects. Patient future oriented on inpatient rehab versus sober living and has been making calls. Patient denies AVH today. Denies SI or HI. Previously endorsing vague AVH however has not appeared to be responding to internal stimuli throughout admission, additionally very organized, linear and goal directed which is incongruent to schizophrenia; patient has been observed to be interacting with peers and laughing and playing sports. I agree with him that sober living environment as well as management of his HIV are very important and he has not been able to do these things while homeless and unemployed. Patient was paranoid and irritable on initial presentaiton to hospital which was consistent with his recent cocaine abuse however does not appear paranoid on evaluation today. Denies any side effects from current medications.  Principal Problem: Substance-induced psychotic disorder (HCC) Diagnosis: Principal Problem:   Substance-induced psychotic disorder (HCC) Active Problems:   MDD (major depressive disorder), recurrent episode, moderate (HCC)  Total Time spent with patient: 30 minutes  Past Psychiatric History:   Past Medical History:  Past Medical History:  Diagnosis Date   Depression    HIV (human immunodeficiency virus infection) (HCC)    Psychosis (HCC)    Substance abuse (HCC)    History reviewed. No pertinent surgical history. Family History:  Family History  Problem Relation Age of Onset   Arthritis Mother    Diabetes Father    Hypertension Father    Family Psychiatric  History: denies Social History:  Social History   Substance and Sexual Activity  Alcohol Use Not Currently   Comment: socially     Social History   Substance and Sexual Activity  Drug Use Yes    Types: Cocaine, Marijuana, Methamphetamines   Comment: weekly    Social History   Socioeconomic History   Marital status: Single    Spouse name: Not on file   Number of children: 0   Years of education: Not on file   Highest education level: Associate degree: academic program  Occupational History   Not on file  Tobacco Use   Smoking status: Every Day    Current packs/day: 0.50    Types: Cigarettes   Smokeless tobacco: Never   Tobacco comments:    1 PPD  Substance and Sexual Activity   Alcohol use: Not Currently    Comment: socially   Drug use: Yes    Types: Cocaine, Marijuana, Methamphetamines    Comment: weekly   Sexual activity: Not Currently  Other Topics Concern   Not on file  Social History Narrative   Homeless x 2 yrs.    Social Drivers of Corporate investment banker Strain: Not on file  Food Insecurity: Food Insecurity Present (09/06/2023)   Hunger Vital Sign    Worried About Running Out of Food in the Last Year: Often true    Ran Out of Food in the Last Year: Often true  Transportation Needs: Unmet Transportation Needs (09/06/2023)   PRAPARE - Administrator, Civil Service (Medical): Yes    Lack of Transportation (Non-Medical): Yes  Physical Activity: Not on file  Stress: Not on file  Social Connections: Socially Isolated (08/09/2023)   Social Connection and Isolation Panel [NHANES]    Frequency of Communication with Friends and Family: Never    Frequency of Social  Gatherings with Friends and Family: Never    Attends Religious Services: Never    Database administrator or Organizations: No    Attends Engineer, structural: Never    Marital Status: Never married   Additional Social History:                         Sleep: Fair  Appetite:  Fair  Current Medications: Current Facility-Administered Medications  Medication Dose Route Frequency Provider Last Rate Last Admin   acetaminophen (TYLENOL) tablet 650 mg  650 mg Oral Q6H  PRN Roise Cleaver, NP   650 mg at 09/08/23 1302   alum & mag hydroxide-simeth (MAALOX/MYLANTA) 200-200-20 MG/5ML suspension 30 mL  30 mL Oral Q4H PRN Roise Cleaver, NP   30 mL at 09/09/23 1114   atovaquone (MEPRON) 750 MG/5ML suspension 1,500 mg  1,500 mg Oral Q breakfast Sheri Gatchel, MD   1,500 mg at 09/11/23 0841   bictegravir-emtricitabine-tenofovir AF (BIKTARVY) 50-200-25 MG per tablet 1 tablet  1 tablet Oral Daily Orson Blalock, NP   1 tablet at 09/11/23 0841   haloperidol (HALDOL) tablet 5 mg  5 mg Oral TID PRN Roise Cleaver, NP   5 mg at 09/06/23 2015   And   diphenhydrAMINE (BENADRYL) capsule 50 mg  50 mg Oral TID PRN Roise Cleaver, NP   50 mg at 09/10/23 2151   haloperidol lactate (HALDOL) injection 5 mg  5 mg Intramuscular TID PRN Roise Cleaver, NP       And   diphenhydrAMINE (BENADRYL) injection 50 mg  50 mg Intramuscular TID PRN Roise Cleaver, NP       And   LORazepam (ATIVAN) injection 2 mg  2 mg Intramuscular TID PRN Roise Cleaver, NP       haloperidol lactate (HALDOL) injection 10 mg  10 mg Intramuscular TID PRN Roise Cleaver, NP       And   diphenhydrAMINE (BENADRYL) injection 50 mg  50 mg Intramuscular TID PRN Roise Cleaver, NP       And   LORazepam (ATIVAN) injection 2 mg  2 mg Intramuscular TID PRN Roise Cleaver, NP       diphenhydrAMINE-zinc acetate (BENADRYL) 2-0.1 % cream   Topical BID PRN Ajibola, Ene A, NP       hydrOXYzine (ATARAX) tablet 25 mg  25 mg Oral TID PRN Roise Cleaver, NP       magnesium hydroxide (MILK OF MAGNESIA) suspension 30 mL  30 mL Oral Daily PRN Roise Cleaver, NP       methocarbamol (ROBAXIN) tablet 500 mg  500 mg Oral Q6H PRN Riannon Mukherjee, MD   500 mg at 09/10/23 2150   nicotine (NICODERM CQ - dosed in mg/24 hours) patch 14 mg  14 mg Transdermal Daily Dalton Mille, MD   14 mg at 09/11/23 0842   nystatin cream (MYCOSTATIN)   Topical BID Latysha Thackston, MD   Given at 09/11/23 1635   QUEtiapine (SEROQUEL XR)  24 hr tablet 300 mg  300 mg Oral QHS Maryland Stell, MD   300 mg at 09/10/23 2151   vortioxetine HBr (TRINTELLIX) tablet 10 mg  10 mg Oral Daily Willadean Guyton, MD   10 mg at 09/11/23 5366    Lab Results:  Results for orders placed or performed during the hospital encounter of 09/06/23 (from the past 48 hours)  RPR     Status: Abnormal   Collection Time: 09/09/23  6:28 PM  Result  Value Ref Range   RPR Ser Ql Reactive (A) NON REACTIVE    Comment: SENT FOR CONFIRMATION   RPR Titer 1:32     Comment: Performed at [facility], [address] [place], [place] [address]  HIV-1 RNA quant-no reflex-bld     Status: None   Collection Time: 09/10/23  6:23 AM  Result Value Ref Range   HIV 1 RNA Quant 910 copies/mL    Comment: (NOTE) The reportable range for this assay is 20 to 10,000,000 copies HIV-1 RNA/mL.    LOG10 HIV-1 RNA 2.959 log10copy/mL    Comment: (NOTE) Performed At: [facility] [address] [place], [place] [id number] Pearlean Botts MD YQ:[id number]     Blood Alcohol level:  Lab Results  Component Value Date   [facility] <10 09/05/2023   ETH <10 08/08/2023    Metabolic Disorder Labs: Lab Results  Component Value Date   HGBA1C 4.6 (L) 08/12/2023   MPG 85.32 08/12/2023   No results found for: "PROLACTIN" Lab Results  Component Value Date   CHOL 167 08/12/2023   TRIG 239 (H) 08/12/2023   HDL 30 (L) 08/12/2023   CHOLHDL 5.6 08/12/2023   VLDL 48 (H) 08/12/2023   LDLCALC 89 08/12/2023     Musculoskeletal: Strength & Muscle Tone: within normal limits Gait & Station: normal Patient leans: N/A  Psychiatric Specialty Exam:  Presentation  General Appearance:  Appropriate for Environment  Eye Contact: Fair  Speech: Clear and Coherent  Speech Volume: Normal  Handedness: Right   Mood and Affect  Mood: euthymic  Affect: Appropriate   Thought Process  Thought Processes: Coherent  Descriptions of Associations:Intact  Orientation:Full  (Time, Place and Person)  Thought Content:Logical  History of Schizophrenia/Schizoaffective disorder:No  Duration of Psychotic Symptoms:Greater than six months  Hallucinations:patient reports AH of multiple voices noncommand in nature but has not been observed to be RTIS  Ideas of Reference:None  Suicidal Thoughts:denies SI  Homicidal Thoughts:denies HI   Sensorium  Memory: Immediate Fair  Judgment: Fair  Insight: Fair   Art therapist  Concentration: Fair  Attention Span: Fair  Recall: [employer] of Knowledge: Fair  Language: Fair   Psychomotor Activity  Psychomotor Activity: No data recorded   Assets  Assets: Communication Skills   Sleep  Sleep: fair    Physical Exam: Physical exam: Please see exam on admit note. General: Well developed, well nourished.  Pupils: Normal at 3mm Respiratory: Breathing is unlabored.  Cardiovascular: No edema.  Language: No anomia, no aphasia Muscle strength and tone-pt moving all extremities.  Gait not assessed as pt remained in bed.  Neuro: Facial muscles are symmetric. Pt without tremor, no evidence of hyperarousal.  Review of Systems  Constitutional: Negative.   HENT: Negative.    Eyes: Negative.   Respiratory: Negative.    Cardiovascular: Negative.   Gastrointestinal: Negative.   Genitourinary: Negative.   Musculoskeletal: Negative.   Skin: Negative.   Neurological:  Positive for tingling.  Endo/Heme/Allergies: Negative.   Psychiatric/Behavioral:  Positive for depression and suicidal ideas.    Blood pressure 135/84, pulse 94, temperature 97.7 F (36.5 C), temperature source Oral, resp. rate 16, height 5\' 2"  (1.575 m), weight 57.2 kg, SpO2 99%. Body mass index is 23.05 kg/m.   Treatment Plan Summary: 33 y.o. male  never married African-American male with a self-reported history of  self reported " schizophrenia, bipolar with psychotic features", polysubstance abuse (methamphetamine,  cocaine, marijuana), and documented history of MDD and substance induced mood disorder  and psychosis as well as medical history of AIDS, neurosyphilis, noncompliance with medications.   4/9: Patient has been abusing cocaine and cannabis with poor sleep and states he hears multiple voices non command in nature today.  Vague historian as to whether he has been taking medications recently or not but states he has nowhere to stay and no transportation so has not been able to followup with outpatient services and has not been taking his medications consistently. Patient claims that he has tried "every antipsychotic and none of them work." This is inconsistent with prior presentation to consult service where patient is documented as having had a positive respond to Seroquel. Patient also appears clear, linear and goal directed and does not appear to be responding to internal stimuli but does appear depressed. Patient does appear dysphoric and reports SI with plan to overdose but denies intent.  Patient recently treated for neurosyphilis and has been poorly compliant with HIV medications. I contacted ARNP Dixon his outpatient provider and will restart Atovaquone (patient refusing bactrim due to recent AKI where consult ID advised against) as well as Bictarvy.  Patient reports prior benefit from Rexuli although overall poor and vague historian stating "I've tried every medication" but can only name a few. I reached out to his outpatient provider Chapman Commodore who reported benefit from Seroquel.   4/10: Patient reports SI with plan however is future oriented asking for assistance with sober living, inpatient rehab or ALF. Patient has been unable to followup regularly to manage his HIV, syphilis and has been abusing cocaine and cannabis. Expresses interest in sobriety. Does not appear psychotic as previously documented and is not responding to internal stimuli.  4/11: patient with behavioral outburst in the morning and  was threatening to kill himself if he doesn't get help with rehab services. Did appear motivated later in afternoon.  4/12: patient continues to have mood lability and had to be moved to [address] hall due to threatening to kill another patient; when I saw patient he is calm and cooperative and states he was upset about his room being hot, Patient additionally future oriented on getting a job and finding sober living. Patient has been upset about his roommate turning up the temperature to 90 degrees in the room the last 2 days. ON evaluation, patient irritable although easily redirectable. Claims he is having AVH however does not appear to be responding to internal stimuli, and patient linear and goal directed. Patient has been very demanding the last several days stating case management needs to find him a place to stay. Patient intermittently making suicidal statements although denies SI or HI at time of m y evaluation. Symptom are more consistent with MDD, substance induced mood disorder. Patient's thoughts, speech and organization as well as lack of delusions or RTIS are not consistent with schizophrenia or schizoaffective disorder although he does often present wot the hospital initially paranoid and irritable after abusing cocaine. Patient does seem motivated this time to maintain sobriety and find a sober living house and has been making calls even over the weekend to find sober living or inpatient rehab.   Patient was later observed in afternoon, placing basketball, interacting with peers and laughing and denied SI or HI today. States he did not take Seroquel last night due to being upset.  4/13: patient denies SI or HI today, tolerating increased Seroquel dosing and Trintellix. Has had behavioral outbursts including threatening to kill himself and his roommate the day before yesterday and yesterday,respectively. Denies today.  Future oriented on rehab or sober living   Daily contact with patient to assess  and evaluate symptoms and progress in treatment, Medication management, and Plan:  -cont Seroquel XR 300 mg at bedtime which patient has historically had a positive response to per documentation  -cont Rexulti 10 mg qdaily for MDD as patient reports possible prior benefit Observation Level/Precautions:  15 minute checks  Laboratory:  CBC Chemistry Profile UDS UA Vitamin B-12  Psychotherapy:  will attend group therapy  Medications:  Seroquel and Rexulti  Consultations:  curbside consult with ID (ordered CD4 counts as well as HIV testing)  Discharge Concerns:  needs residential rehab or sober living and stable living environment as well as likely ACTS services  Estimated LOS:5-7 days  Other:       Medical Consult: curbside consult with outpatient provider Patient had CD4 drawn but not RPR or HIV-RNA so I reordered labs and will followup Vital signs stable  Reached out to [facility] regarding patient's lab results and will followup: CD4:258, RPR (reactive, titer 1:32); The HIV RNA lab looks like is still pending  See noted from 4/10 with following recs: "In review of his chart and records I Almon't feel strongly he needs an additional dose of the bicillin as his infection appears to be early between July 2024 and October 2024.    I will repeat a RPR, CD4 and HIV RNA level (last one was 196,000 copies in February).    Continue biktarvy once daily, regarding pneumocystis prophylaxis he can continue either Bactrim 1 SS daily or Atovaquone 1500 mg daily. His creatinine is normal but Leah does not want to "shut down his kidneys" per what he recalls from previous [facility] ID team."  4/12: curbsided ARNP dixon who notes: "the syphilis titer has already shown a > 4 fold reduction following the treatment he has already had so even more comfortable with no further bicillin. That was a bit of an overkill recommendation - which is OK to be over cautious sometimes.  His CD4 is better - I would say he should  continue the atovaquone another 3 months then we can stop it IF he stays on his Biktarvy more consistently - which he needs to continue of course lifelong.  I will take a peak at the viral load Monday "     Physician Treatment Plan for Primary Diagnosis: Substance-induced psychotic disorder [place]) Long Term Goal(s): Improvement in symptoms so as ready for discharge   Short Term Goals: Ability to identify changes in lifestyle to reduce recurrence of condition will improve, Ability to verbalize feelings will improve, Ability to disclose and discuss suicidal ideas, Ability to demonstrate self-control will improve, Ability to identify and develop effective coping behaviors will improve, Ability to maintain clinical measurements within normal limits will improve, Compliance with prescribed medications will improve, and Ability to identify triggers associated with substance abuse/mental health issues will improve   Physician Treatment Plan for Secondary Diagnosis: Principal Problem:   Substance-induced psychotic disorder (HCC) Active Problems:   MDD (major depressive disorder), recurrent episode, moderate (HCC)   Long Term Goal(s): Improvement in symptoms so as ready for discharge   Short Term Goals: Ability to identify changes in lifestyle to reduce recurrence of condition will improve, Ability to verbalize feelings will improve, Ability to disclose and discuss suicidal ideas, Ability to demonstrate self-control will improve, Ability to identify and develop effective coping behaviors will improve, Ability to maintain clinical measurements within normal limits will improve, and Compliance with prescribed  medications will improve   I certify that inpatient services furnished can reasonably be expected to improve the patient's condition.       Mechel Schutter, MD 09/11/2023, 5:24 PM

## 2023-09-11 NOTE — Plan of Care (Signed)
   Problem: Education: Goal: Emotional status will improve Outcome: Progressing Goal: Mental status will improve Outcome: Progressing Goal: Verbalization of understanding the information provided will improve Outcome: Progressing

## 2023-09-11 NOTE — [facility] Group Notes (Signed)
 [facility] Group Notes:  (Nursing/MHT/Case Management/Adjunct)  Date:  09/11/2023  Time:  9:17 PM  Type of Therapy:   Wrap-up group  Participation Level:  None  Participation Quality:  Resistant  Affect:  Resistant  Cognitive:  Lacking  Insight:  None  Engagement in Group:  None  Modes of Intervention:  Education  Summary of Progress/Problems: Attended group, didn't participate.   Lee Bridges 09/11/2023, 9:17 PM

## 2023-09-11 NOTE — BH Assessment (Signed)
 Patient was alert and oriented at beginning of shift. Pt was resting in bedroom prior to group. He c/o a rash that was burning under his arm pits. Redness noted on outer portion of lt armpit. States that it started a few days ago and he did not use any different deodorant. NP notified, no new orders received. Pt later stated it was itching. Pt did get up and went to group and had snack with other patients. Took nighttime meds without difficulty and went to bed without further complaints. Denies SI/HI and AVH. 15 min checks maintained

## 2023-09-11 NOTE — Progress Notes (Signed)
 Patient yelling at peer, verbally redirected, contracted for safety. PRN hydroxyzine 25mg  PO offered and administered.

## 2023-09-12 ENCOUNTER — Encounter (HOSPITAL_COMMUNITY): Payer: Self-pay

## 2023-09-12 LAB — T.PALLIDUM AB, TOTAL: T Pallidum Abs: REACTIVE — AB

## 2023-09-12 MED ORDER — QUETIAPINE FUMARATE ER 400 MG PO TB24
400.0000 mg | ORAL_TABLET | Freq: Every day | ORAL | Status: DC
  Administered 2023-09-13 – 2023-09-19 (×7): 400 mg via ORAL
  Filled 2023-09-12 (×8): qty 1

## 2023-09-12 MED ORDER — QUETIAPINE FUMARATE ER 50 MG PO TB24
100.0000 mg | ORAL_TABLET | Freq: Once | ORAL | Status: AC
  Administered 2023-09-12: 100 mg via ORAL
  Filled 2023-09-12 (×2): qty 2

## 2023-09-12 NOTE — BH Assessment (Signed)
 Patient was in room at beginning of shift. He did get up and go to group this last evening and had snack with the rest of patients. Pt denies SI/HI, and AVH. Pt has been pleasant and cooperative this shift without problems

## 2023-09-12 NOTE — Group Note (Signed)
 Date:  09/12/2023 Time:  9:03 PM  Group Topic/Focus:  Alcoholics Anonymous (AA) Meeting    Participation Level:  Active  Participation Quality:  Appropriate  Affect:  Appropriate  Cognitive:  Appropriate  Insight: Appropriate  Engagement in Group:  Engaged  Modes of Intervention:  Socialization and Support  Additional Comments:  Patient attended AA  Dillard Frame 09/12/2023, 9:03 PM

## 2023-09-12 NOTE — Group Note (Signed)
 Recreation Therapy Group Note   Group Topic:Stress Management  Group Date: 09/12/2023 Start Time: 0931 End Time: 0954 Facilitators: Rubylee Zamarripa-McCall, LRT,CTRS Location: 300 Hall Dayroom   Group Topic: Stress Management  Goal Area(s) Addresses:  Patient will identify positive stress management techniques. Patient will identify benefits of using stress management post d/c.  Intervention: Insight Timer App  Activity: Meditation. LRT played a meditation for patients that focused on morning energy and focus. Patients were to listen and follow along as the meditation took them on a journey to prepare them for the day ahead.    Education:  Stress Management, Discharge Planning.   Education Outcome: Acknowledges Education   Affect/Mood: N/A   Participation Level: Did not attend    Clinical Observations/Individualized Feedback:     Plan: Continue to engage patient in RT group sessions 2-3x/week.   Allyn Bertoni-McCall, LRT,CTRS 09/12/2023 12:49 PM

## 2023-09-12 NOTE — Group Note (Signed)
 Occupational Therapy Group Note  Group Topic: Sleep Hygiene  Group Date: 09/12/2023 Start Time: 1422 End Time: 1558 Facilitators: Lynnda Sas, OT   Group Description: Group encouraged increased participation and engagement through topic focused on sleep hygiene. Patients reflected on the quality of sleep they typically receive and identified areas that need improvement. Group was given background information on sleep and sleep hygiene, including common sleep disorders. Group members also received information on how to improve one's sleep and introduced a sleep diary as a tool that can be utilized to track sleep quality over a length of time. Group session ended with patients identifying one or more strategies they could utilize or implement into their sleep routine in order to improve overall sleep quality.        Therapeutic Goal(s):  Identify one or more strategies to improve overall sleep hygiene  Identify one or more areas of sleep that are negatively impacted (sleep too much, too little, etc)     Participation Level: Engaged   Participation Quality: Independent   Behavior: Hyperverbal   Speech/Thought Process: Loose association  and Tangential    Affect/Mood: Appropriate   Insight: Limited   Judgement: Fair      Modes of Intervention: Education  Patient Response to Interventions:  Attentive   Plan: Continue to engage patient in OT groups 2 - 3x/week.  09/12/2023  Lynnda Sas, OT   Chantale Leugers, OT

## 2023-09-12 NOTE — Discharge Instructions (Addendum)
 Marland Kitchen

## 2023-09-12 NOTE — Plan of Care (Signed)
 Nurse discussed anxiety, depression and coping skills with patient.

## 2023-09-12 NOTE — Group Note (Signed)
 Date:  09/12/2023 Time:  10:07 AM  Group Topic/Focus:  Goals Group:   The focus of this group is to help patients establish daily goals to achieve during treatment and discuss how the patient can incorporate goal setting into their daily lives to aide in recovery.    Participation Level:  Active  Participation Quality:  Appropriate  Affect:  Appropriate  Lee Bridges 09/12/2023, 10:07 AM

## 2023-09-12 NOTE — Progress Notes (Signed)
 [facility] MD Progress Note  09/12/2023 8:39 PM Green Quin[place]  MRN:  [id number]  Per nursing documentation: "Poor appetite, low energy level, good concentration. Rated depression 8, hopeless 9, anxiety 10. "  Subjective:   Patient reports good sleep and appetite.  Patient notes continued depression and anxiety. Reports benefit from Seroquel increase and states she talked with his mother and "I'd like to try a higher dose of the Seroquel to keep me from flipping out." Of note patient has been significantly calmer and less agitated with mood regulation since increase of Seroquel. Denies SI or HI today. Denies side effects. Patient future oriented on inpatient rehab versus sober living and has been making calls. Patient denies AVH today.   Principal Problem: Substance-induced psychotic disorder (HCC) Diagnosis: Principal Problem:   Substance-induced psychotic disorder (HCC) Active Problems:   MDD (major depressive disorder), recurrent episode, moderate (HCC)  Total Time spent with patient: 30 minutes  Past Psychiatric History:   Past Medical History:  Past Medical History:  Diagnosis Date   Depression    HIV (human immunodeficiency virus infection) (HCC)    Psychosis (HCC)    Substance abuse (HCC)    History reviewed. No pertinent surgical history. Family History:  Family History  Problem Relation Age of Onset   Arthritis Mother    Diabetes Father    Hypertension Father    Family Psychiatric  History: denies Social History:  Social History   Substance and Sexual Activity  Alcohol Use Not Currently   Comment: socially     Social History   Substance and Sexual Activity  Drug Use Yes   Types: Cocaine, Marijuana, Methamphetamines   Comment: weekly    Social History   Socioeconomic History   Marital status: Single    Spouse name: Not on file   Number of children: 0   Years of education: Not on file   Highest education level: Associate degree: academic program   Occupational History   Not on file  Tobacco Use   Smoking status: Every Day    Current packs/day: 0.50    Types: Cigarettes   Smokeless tobacco: Never   Tobacco comments:    1 PPD  Substance and Sexual Activity   Alcohol use: Not Currently    Comment: socially   Drug use: Yes    Types: Cocaine, Marijuana, Methamphetamines    Comment: weekly   Sexual activity: Not Currently  Other Topics Concern   Not on file  Social History Narrative   Homeless x 2 yrs.    Social Drivers of Corporate investment banker Strain: Not on file  Food Insecurity: Food Insecurity Present (09/06/2023)   Hunger Vital Sign    Worried About Running Out of Food in the Last Year: Often true    Ran Out of Food in the Last Year: Often true  Transportation Needs: Unmet Transportation Needs (09/06/2023)   PRAPARE - Administrator, Civil Service (Medical): Yes    Lack of Transportation (Non-Medical): Yes  Physical Activity: Not on file  Stress: Not on file  Social Connections: Socially Isolated (08/09/2023)   Social Connection and Isolation Panel [NHANES]    Frequency of Communication with Friends and Family: Never    Frequency of Social Gatherings with Friends and Family: Never    Attends Religious Services: Never    Database administrator or Organizations: No    Attends Banker Meetings: Never    Marital Status: Never married   Additional  Social History:                         Sleep: Fair  Appetite:  Fair  Current Medications: Current Facility-Administered Medications  Medication Dose Route Frequency Provider Last Rate Last Admin   acetaminophen (TYLENOL) tablet 650 mg  650 mg Oral Q6H PRN Eligha Bridegroom, NP   650 mg at 09/12/23 1958   alum & mag hydroxide-simeth (MAALOX/MYLANTA) 200-200-20 MG/5ML suspension 30 mL  30 mL Oral Q4H PRN Eligha Bridegroom, NP   30 mL at 09/09/23 1114   atovaquone (MEPRON) 750 MG/5ML suspension 1,500 mg  1,500 mg Oral Q breakfast  Miguel Rota, MD   1,500 mg at 09/12/23 0854   bictegravir-emtricitabine-tenofovir AF (BIKTARVY) 50-200-25 MG per tablet 1 tablet  1 tablet Oral Daily Blanchard Kelch, NP   1 tablet at 09/12/23 0854   haloperidol (HALDOL) tablet 5 mg  5 mg Oral TID PRN Eligha Bridegroom, NP   5 mg at 09/06/23 2015   And   diphenhydrAMINE (BENADRYL) capsule 50 mg  50 mg Oral TID PRN Eligha Bridegroom, NP   50 mg at 09/10/23 2151   haloperidol lactate (HALDOL) injection 5 mg  5 mg Intramuscular TID PRN Eligha Bridegroom, NP       And   diphenhydrAMINE (BENADRYL) injection 50 mg  50 mg Intramuscular TID PRN Eligha Bridegroom, NP       And   LORazepam (ATIVAN) injection 2 mg  2 mg Intramuscular TID PRN Eligha Bridegroom, NP       haloperidol lactate (HALDOL) injection 10 mg  10 mg Intramuscular TID PRN Eligha Bridegroom, NP       And   diphenhydrAMINE (BENADRYL) injection 50 mg  50 mg Intramuscular TID PRN Eligha Bridegroom, NP       And   LORazepam (ATIVAN) injection 2 mg  2 mg Intramuscular TID PRN Eligha Bridegroom, NP       diphenhydrAMINE-zinc acetate (BENADRYL) 2-0.1 % cream   Topical BID PRN Ajibola, Ene A, NP       hydrOXYzine (ATARAX) tablet 25 mg  25 mg Oral TID PRN Eligha Bridegroom, NP   25 mg at 09/11/23 1835   magnesium hydroxide (MILK OF MAGNESIA) suspension 30 mL  30 mL Oral Daily PRN Eligha Bridegroom, NP       methocarbamol (ROBAXIN) tablet 500 mg  500 mg Oral Q6H PRN Zakayla Martinec, MD   500 mg at 09/12/23 2026   nicotine (NICODERM CQ - dosed in mg/24 hours) patch 14 mg  14 mg Transdermal Daily Cherae Marton, MD   14 mg at 09/12/23 8657   nystatin cream (MYCOSTATIN)   Topical BID Miguel Rota, MD   Given at 09/12/23 1650   QUEtiapine (SEROQUEL XR) 24 hr tablet 100 mg  100 mg Oral Once Miguel Rota, MD       [START ON 09/13/2023] QUEtiapine (SEROQUEL XR) 24 hr tablet 400 mg  400 mg Oral QHS Orli Degrave, MD       vortioxetine HBr (TRINTELLIX) tablet 10 mg  10 mg Oral Daily Miguel Rota, MD   10 mg at  09/12/23 0856    Lab Results:  No results found for this or any previous visit (from the past 48 hours).   Blood Alcohol level:  Lab Results  Component Value Date   [facility] <10 09/05/2023   ETH <10 08/08/2023    Metabolic Disorder Labs: Lab Results  Component Value Date   HGBA1C 4.6 (  L) 08/12/2023   MPG 85.32 08/12/2023   No results found for: "PROLACTIN" Lab Results  Component Value Date   CHOL 167 08/12/2023   TRIG 239 (H) 08/12/2023   HDL 30 (L) 08/12/2023   CHOLHDL 5.6 08/12/2023   VLDL 48 (H) 08/12/2023   LDLCALC 89 08/12/2023     Musculoskeletal: Strength & Muscle Tone: within normal limits Gait & Station: normal Patient leans: N/A  Psychiatric Specialty Exam:  Presentation  General Appearance:  Appropriate for Environment  Eye Contact: Fair  Speech: Clear and Coherent  Speech Volume: Normal  Handedness: Right   Mood and Affect  Mood: Euthymic, "better"  Affect: Appropriate   Thought Process  Thought Processes: Coherent  Descriptions of Associations:Intact  Orientation:Full (Time, Place and Person)  Thought Content:Logical  History of Schizophrenia/Schizoaffective disorder:No  Duration of Psychotic Symptoms:Greater than six months  Hallucinations:patient reports AH of multiple voices noncommand in nature but has not been observed to be RTIS  Ideas of Reference:None  Suicidal Thoughts:denies SI  Homicidal Thoughts:denies HI   Sensorium  Memory: Immediate Fair  Judgment: Fair  Insight: Fair   Art therapist  Concentration: Fair  Attention Span: Fair  Recall: [employer] of Knowledge: Fair  Language: Fair   Psychomotor Activity  Psychomotor Activity: No data recorded   Assets  Assets: Communication Skills   Sleep  Sleep: fair    Physical Exam: Physical exam: Please see exam on admit note. General: Well developed, well nourished.  Pupils: Normal at 3mm Respiratory: Breathing is  unlabored.  Cardiovascular: No edema.  Language: No anomia, no aphasia Muscle strength and tone-pt moving all extremities.  Gait not assessed as pt remained in bed.  Neuro: Facial muscles are symmetric. Pt without tremor, no evidence of hyperarousal.  Review of Systems  Constitutional: Negative.   HENT: Negative.    Eyes: Negative.   Respiratory: Negative.    Cardiovascular: Negative.   Gastrointestinal: Negative.   Genitourinary: Negative.   Musculoskeletal: Negative.   Skin: Negative.   Neurological:  Positive for tingling.  Endo/Heme/Allergies: Negative.   Psychiatric/Behavioral:  Positive for depression and suicidal ideas.    Blood pressure 130/85, pulse (!) 110, temperature 97.7 F (36.5 C), temperature source Oral, resp. rate 16, height 5\' 2"  (1.575 m), weight 57.2 kg, SpO2 99%. Body mass index is 23.05 kg/m.   Treatment Plan Summary: 33 y.o. male  never married African-American male with a self-reported history of  self reported " schizophrenia, bipolar with psychotic features", polysubstance abuse (methamphetamine, cocaine, marijuana), and documented history of MDD and substance induced mood disorder and psychosis as well as medical history of AIDS, neurosyphilis, noncompliance with medications.   4/9: Patient has been abusing cocaine and cannabis with poor sleep and states he hears multiple voices non command in nature today.  Vague historian as to whether he has been taking medications recently or not but states he has nowhere to stay and no transportation so has not been able to followup with outpatient services and has not been taking his medications consistently. Patient claims that he has tried "every antipsychotic and none of them work." This is inconsistent with prior presentation to consult service where patient is documented as having had a positive respond to Seroquel. Patient also appears clear, linear and goal directed and does not appear to be responding to internal  stimuli but does appear depressed. Patient does appear dysphoric and reports SI with plan to overdose but denies intent.  Patient recently treated for neurosyphilis and has been  poorly compliant with HIV medications. I contacted ARNP Dixon his outpatient provider and will restart Atovaquone (patient refusing bactrim due to recent AKI where consult ID advised against) as well as Bictarvy.  Patient reports prior benefit from Rexuli although overall poor and vague historian stating "I've tried every medication" but can only name a few. I reached out to his outpatient provider Chapman Commodore who reported benefit from Seroquel.   4/10: Patient reports SI with plan however is future oriented asking for assistance with sober living, inpatient rehab or ALF. Patient has been unable to followup regularly to manage his HIV, syphilis and has been abusing cocaine and cannabis. Expresses interest in sobriety. Does not appear psychotic as previously documented and is not responding to internal stimuli.  4/11: patient with behavioral outburst in the morning and was threatening to kill himself if he doesn't get help with rehab services. Did appear motivated later in afternoon.  4/12: patient continues to have mood lability and had to be moved to 500 hall due to threatening to kill another patient; when I saw patient he is calm and cooperative and states he was upset about his room being hot, Patient additionally future oriented on getting a job and finding sober living. Patient has been upset about his roommate turning up the temperature to 90 degrees in the room the last 2 days. ON evaluation, patient irritable although easily redirectable. Claims he is having AVH however does not appear to be responding to internal stimuli, and patient linear and goal directed. Patient has been very demanding the last several days stating case management needs to find him a place to stay. Patient intermittently making suicidal statements  although denies SI or HI at time of m y evaluation. Symptom are more consistent with MDD, substance induced mood disorder. Patient's thoughts, speech and organization as well as lack of delusions or RTIS are not consistent with schizophrenia or schizoaffective disorder although he does often present wot the hospital initially paranoid and irritable after abusing cocaine. Patient does seem motivated this time to maintain sobriety and find a sober living house and has been making calls even over the weekend to find sober living or inpatient rehab.   Patient was later observed in afternoon, placing basketball, interacting with peers and laughing and denied SI or HI today. States he did not take Seroquel last night due to being upset.  4/13: patient denies SI or HI today, tolerating increased Seroquel dosing and Trintellix. Has had behavioral outbursts including threatening to kill himself and his roommate the day before yesterday and yesterday,respectively. Denies today. Future oriented on rehab or sober living  4/14: patient denies SI, HI or AVH today. Reporting benefit from Seroquel increase but states "I still feel like flipping out" and requesting further increase. Patient working with social work for sober living versus inpatient rehab placement   Daily contact with patient to assess and evaluate symptoms and progress in treatment, Medication management, and Plan:  -increase Seroquel XR to 400  mg at bedtime which patient has historically had a positive response to per documentation and has been less agitated with increase with less mood lability, no longer threatening to kill himself or kill his roommate and reports benefit from medication -cont Rexulti 10 mg qdaily for MDD as patient reports possible prior benefit Observation Level/Precautions:  15 minute checks  Laboratory:  CBC Chemistry Profile UDS UA Vitamin B-12  Psychotherapy:  will attend group therapy  Medications:  Seroquel and Rexulti   Consultations:  curbside  consult with ID (ordered CD4 counts as well as HIV testing)  Discharge Concerns:  needs residential rehab or sober living and stable living environment as well as likely ACTS services  Estimated LOS:3-5 days  Other:       Medical Consult: curbside consult with outpatient provider Patient had CD4 drawn but not RPR or HIV-RNA so I reordered labs and will followup Vital signs stable  Reached out to [facility] regarding patient's lab results and will followup: CD4:258, RPR (reactive, titer 1:32); The HIV RNA lab looks like is still pending  See noted from 4/10 with following recs: "In review of his chart and records I Hussein't feel strongly he needs an additional dose of the bicillin as his infection appears to be early between July 2024 and October 2024.    I will repeat a RPR, CD4 and HIV RNA level (last one was 196,000 copies in February).    Continue biktarvy once daily, regarding pneumocystis prophylaxis he can continue either Bactrim 1 SS daily or Atovaquone 1500 mg daily. His creatinine is normal but Mariano does not want to "shut down his kidneys" per what he recalls from previous [facility] ID team."  4/12: curbsided ARNP dixon who notes: "the syphilis titer has already shown a > 4 fold reduction following the treatment he has already had so even more comfortable with no further bicillin. That was a bit of an overkill recommendation - which is OK to be over cautious sometimes.  His CD4 is better - I would say he should continue the atovaquone another 3 months then we can stop it IF he stays on his Biktarvy more consistently - which he needs to continue of course lifelong.  I will take a peak at the viral load Monday "     Physician Treatment Plan for Primary Diagnosis: Substance-induced psychotic disorder [facility]) Long Term Goal(s): Improvement in symptoms so as ready for discharge   Short Term Goals: Ability to identify changes in lifestyle to reduce recurrence of  condition will improve, Ability to verbalize feelings will improve, Ability to disclose and discuss suicidal ideas, Ability to demonstrate self-control will improve, Ability to identify and develop effective coping behaviors will improve, Ability to maintain clinical measurements within normal limits will improve, Compliance with prescribed medications will improve, and Ability to identify triggers associated with substance abuse/mental health issues will improve   Physician Treatment Plan for Secondary Diagnosis: Principal Problem:   Substance-induced psychotic disorder (HCC) Active Problems:   MDD (major depressive disorder), recurrent episode, moderate (HCC)   Long Term Goal(s): Improvement in symptoms so as ready for discharge   Short Term Goals: Ability to identify changes in lifestyle to reduce recurrence of condition will improve, Ability to verbalize feelings will improve, Ability to disclose and discuss suicidal ideas, Ability to demonstrate self-control will improve, Ability to identify and develop effective coping behaviors will improve, Ability to maintain clinical measurements within normal limits will improve, and Compliance with prescribed medications will improve   I certify that inpatient services furnished can reasonably be expected to improve the patient's condition.       Alabama Doig, MD 09/12/2023, 8:39 PM

## 2023-09-12 NOTE — [facility] Group Notes (Signed)
 Spiritual care group on grief and loss facilitated by Chaplain Nick Barman, B[facility]  Group Goal: Support / Education around grief and loss  Members engage in facilitated group support and psycho-social education.  Group Description:  Following introductions and group rules, group members engaged in facilitated group dialogue and support around topic of loss, with particular support around experiences of loss in their lives. Group Identified types of loss (relationships / self / things) and identified patterns, circumstances, and changes that precipitate losses. Reflected on thoughts / feelings around loss, normalized grief responses, and recognized variety in grief experience. Group encouraged individual reflection on safe space and on the coping skills that they are already utilizing.  Group drew on Adlerian / Rogerian and narrative framework  Patient Progress: Lee Bridges attended group and actively engaged and participated in group conversation and activities.

## 2023-09-12 NOTE — Progress Notes (Signed)
 D:  Patient's self inventory sheet, patient sleeps good, no sleep medication.  Poor appetite, low energy level, good concentration.  Rated depression 8, hopeless 9, anxiety 10.  Withdrawals, chilling, cramping, nausea, irritability  SI, contracts for safety.  Physical problems, pain, dizzy, headaches, rash.  Physical pain.  Goal is understanding what going on.  Plans to talk more.  Stated he wants a roommate, does not want to be alone.  No discharge plans. A:  Medications administered per MD orders.  Emotional support and encouragement given patient. R:  Denied SI and HI this morning, contracts for safety.  Denied A/V hallucinations.  Safety maintained with 15 minute checks.

## 2023-09-13 DIAGNOSIS — F19959 Other psychoactive substance use, unspecified with psychoactive substance-induced psychotic disorder, unspecified: Secondary | ICD-10-CM | POA: Diagnosis not present

## 2023-09-13 LAB — BASIC METABOLIC PANEL WITH GFR
Anion gap: 13 (ref 5–15)
BUN: 10 mg/dL (ref 6–20)
CO2: 23 mmol/L (ref 22–32)
Calcium: 9.1 mg/dL (ref 8.9–10.3)
Chloride: 101 mmol/L (ref 98–111)
Creatinine, Ser: 1.11 mg/dL (ref 0.61–1.24)
GFR, Estimated: >60 mL/min (ref >60)
Glucose, Bld: 97 mg/dL (ref 70–99)
Potassium: 4 mmol/L (ref 3.5–5.1)
Sodium: 137 mmol/L (ref 135–145)

## 2023-09-13 LAB — CBC WITH DIFFERENTIAL/PLATELET
Abs Immature Granulocytes: 0.06 10*3/uL (ref 0.00–0.07)
Basophils Absolute: 0 10*3/uL (ref 0.0–0.1)
Basophils Relative: 1 %
Eosinophils Absolute: 0.6 10*3/uL — ABNORMAL HIGH (ref 0.0–0.5)
Eosinophils Relative: 15 %
HCT: 35.9 % — ABNORMAL LOW (ref 39.0–52.0)
Hemoglobin: 11.9 g/dL — ABNORMAL LOW (ref 13.0–17.0)
Immature Granulocytes: 2 %
Lymphocytes Relative: 50 %
Lymphs Abs: 2.1 10*3/uL (ref 0.7–4.0)
MCH: 35.1 pg — ABNORMAL HIGH (ref 26.0–34.0)
MCHC: 33.1 g/dL (ref 30.0–36.0)
MCV: 105.9 fL — ABNORMAL HIGH (ref 80.0–100.0)
Monocytes Absolute: 0.5 10*3/uL (ref 0.1–1.0)
Monocytes Relative: 12 %
Neutro Abs: 0.8 10*3/uL — ABNORMAL LOW (ref 1.7–7.7)
Neutrophils Relative %: 20 %
Platelets: 254 10*3/uL (ref 150–400)
RBC: 3.39 MIL/uL — ABNORMAL LOW (ref 4.22–5.81)
RDW: 12.7 % (ref 11.5–15.5)
Smear Review: NORMAL
WBC: 4 10*3/uL (ref 4.0–10.5)
nRBC: 0 % (ref 0.0–0.2)

## 2023-09-13 MED ORDER — VORTIOXETINE HBR 20 MG PO TABS
20.0000 mg | ORAL_TABLET | Freq: Every day | ORAL | Status: DC
  Administered 2023-09-14 – 2023-09-15 (×2): 20 mg via ORAL
  Filled 2023-09-13 (×4): qty 1

## 2023-09-13 NOTE — Progress Notes (Signed)
   09/13/23 1700  Psych Admission Type (Psych Patients Only)  Admission Status Voluntary  Psychosocial Assessment  Patient Complaints Depression;Agitation;Anxiety  Eye Contact Fair  Facial Expression Animated  Affect Labile  Speech Aggressive;Logical/coherent  Interaction Attention-seeking  Motor Activity Other (Comment) (wnl)  Appearance/Hygiene Unremarkable  Behavior Characteristics Cooperative  Mood Irritable;Labile  Thought Process  Coherency WDL  Content WDL  Delusions None reported or observed  Perception WDL  Hallucination None reported or observed  Judgment WDL  Confusion None  Danger to Self  Current suicidal ideation? Denies  Description of Suicide Plan none  Self-Injurious Behavior No self-injurious ideation or behavior indicators observed or expressed   Description of Agreement verbal contract  Danger to Others  Danger to Others None reported or observed   Pt returned from cafeteria upset because " fish is being served in there and I can not even be around it ".  Pt returned to his room and was offered taco.  Pt stated that due to cross contamination he could not eat it.  Pt was upset and was noted to slam door.  He was offered agitation medication but refused. Pt was able to control self and displayed no  further outbursts.

## 2023-09-13 NOTE — [facility] Group Notes (Signed)
 [facility] Group Notes:  (Nursing/MHT/Case Management/Adjunct)  Date:  09/13/2023  Time:  9:16 PM  Type of Therapy:   Wrap-up group  Participation Level:  Active  Participation Quality:  Appropriate  Affect:  Appropriate  Cognitive:  Appropriate  Insight:  Appropriate  Engagement in Group:  Engaged  Modes of Intervention:  Education  Summary of Progress/Problems: Goal to get referrals to rehab. Not met. Rated day 6/10.   Alvaro Augusta 09/13/2023, 9:16 PM

## 2023-09-13 NOTE — Group Note (Signed)
 LCSW Group Therapy Note   Group Date: 09/13/2023 Start Time: 1100 End Time: 1200  Participation:  patient was present for half of the group session.  He actively participated in the conversation.    Type of Therapy:  Group Therapy  Topic:  Stronger Together:  Building Healthy Relationships   Objective:  To explore loneliness, boundaries, and safe ways to build relationships.  Goals: Recognize healthy vs. unhealthy relationships. Learn safe ways to connect with others. Strengthen communication and [employer].  Summary:  Participants discussed loneliness, healthy connections, and setting boundaries. They explored safe ways to meet people and shared personal experiences. Key insights were reinforced through discussion and quotes.  Therapeutic Modalities Used: Cognitive Behavioral Therapy (CBT) Elements - Identifying unhealthy relationship patterns, challenging negative thoughts about connection. Dialectical Behavior Therapy (DBT) Elements - Interpersonal effectiveness, setting and maintaining boundaries. Supportive Group Therapy - Peer discussion, shared experiences, and emotional validation.   Crissy Mccreadie O Rigoberto Repass, LCSWA 09/13/2023  5:55 PM

## 2023-09-13 NOTE — Group Note (Signed)
 Date:  09/13/2023 Time:  9:42 AM  Group Topic/Focus:  Goals Group:   The focus of this group is to help patients establish daily goals to achieve during treatment and discuss how the patient can incorporate goal setting into their daily lives to aide in recovery. Orientation:   The focus of this group is to educate the patient on the purpose and policies of crisis stabilization and provide a format to answer questions about their admission.  The group details unit policies and expectations of patients while admitted.    Participation Level:  Did Not Attend   Additional Comments:  Did not attend  Lee Bridges 09/13/2023, 9:42 AM

## 2023-09-13 NOTE — Progress Notes (Signed)
 1347 CSW reached out to [facility] to get an update on referral. Moira Andrews out the office. CSW to follow-up on tomorrow, 4/16.  1400 CSW spoke with Pt on the unit for purposes of discharge planning. He confirms completion of phone assessment with [facility] and is agreeable to referral being sent. He reports wanting call made to [facility] however it is unclear whether he has an active application with their program.   1600 CSW faxed referral information to [facility]   CSW team will continue to follow.   Gracemarie Skeet N Brigham Cobbins, LCSW 09/13/23 6:31 PM

## 2023-09-13 NOTE — BH IP Treatment Plan (Signed)
 Interdisciplinary Treatment and Diagnostic Plan Update  09/13/2023 Time of Session: 1515 - UPDATE Lee Bridges MRN: [id number]  Principal Diagnosis: Substance-induced psychotic disorder [facility])  Secondary Diagnoses: Principal Problem:   Substance-induced psychotic disorder (HCC) Active Problems:   MDD (major depressive disorder), recurrent episode, moderate (HCC)   Current Medications:  Current Facility-Administered Medications  Medication Dose Route Frequency Provider Last Rate Last Admin   acetaminophen (TYLENOL) tablet 650 mg  650 mg Oral Q6H PRN Roise Cleaver, NP   650 mg at 09/12/23 1958   alum & mag hydroxide-simeth (MAALOX/MYLANTA) 200-200-20 MG/5ML suspension 30 mL  30 mL Oral Q4H PRN Roise Cleaver, NP   30 mL at 09/09/23 1114   atovaquone (MEPRON) 750 MG/5ML suspension 1,500 mg  1,500 mg Oral Q breakfast Zouev, Dmitri, MD   1,500 mg at 09/13/23 0733   bictegravir-emtricitabine-tenofovir AF (BIKTARVY) 50-200-25 MG per tablet 1 tablet  1 tablet Oral Daily Orson Blalock, NP   1 tablet at 09/13/23 0734   haloperidol (HALDOL) tablet 5 mg  5 mg Oral TID PRN Roise Cleaver, NP   5 mg at 09/06/23 2015   And   diphenhydrAMINE (BENADRYL) capsule 50 mg  50 mg Oral TID PRN Roise Cleaver, NP   50 mg at 09/10/23 2151   haloperidol lactate (HALDOL) injection 5 mg  5 mg Intramuscular TID PRN Roise Cleaver, NP       And   diphenhydrAMINE (BENADRYL) injection 50 mg  50 mg Intramuscular TID PRN Roise Cleaver, NP       And   LORazepam (ATIVAN) injection 2 mg  2 mg Intramuscular TID PRN Roise Cleaver, NP       haloperidol lactate (HALDOL) injection 10 mg  10 mg Intramuscular TID PRN Roise Cleaver, NP       And   diphenhydrAMINE (BENADRYL) injection 50 mg  50 mg Intramuscular TID PRN Roise Cleaver, NP       And   LORazepam (ATIVAN) injection 2 mg  2 mg Intramuscular TID PRN Roise Cleaver, NP       diphenhydrAMINE-zinc acetate (BENADRYL) 2-0.1 % cream   Topical  BID PRN Ajibola, Ene A, NP       hydrOXYzine (ATARAX) tablet 25 mg  25 mg Oral TID PRN Roise Cleaver, NP   25 mg at 09/11/23 1835   magnesium hydroxide (MILK OF MAGNESIA) suspension 30 mL  30 mL Oral Daily PRN Roise Cleaver, NP       methocarbamol (ROBAXIN) tablet 500 mg  500 mg Oral Q6H PRN Zouev, Dmitri, MD   500 mg at 09/12/23 2026   nicotine (NICODERM CQ - dosed in mg/24 hours) patch 14 mg  14 mg Transdermal Daily Zouev, Dmitri, MD   14 mg at 09/13/23 0734   nystatin cream (MYCOSTATIN)   Topical BID Zouev, Dmitri, MD   1 Application at 09/13/23 0734   QUEtiapine (SEROQUEL XR) 24 hr tablet 400 mg  400 mg Oral QHS Zouev, Dmitri, MD       vortioxetine HBr (TRINTELLIX) tablet 10 mg  10 mg Oral Daily Zouev, Dmitri, MD   10 mg at 09/13/23 0734   PTA Medications: Medications Prior to Admission  Medication Sig Dispense Refill Last Dose/Taking   atovaquone (MEPRON) 750 MG/5ML suspension Take 10 mLs (1,500 mg total) by mouth daily with breakfast. 300 mL 0    methocarbamol (ROBAXIN) 500 MG tablet Take 1 tablet (500 mg total) by mouth every 6 (six) hours as needed for muscle spasms. 20 tablet 0  QUEtiapine (SEROQUEL) 100 MG tablet Take 1 tablet (100 mg total) by mouth at bedtime. 30 tablet 0    sertraline (ZOLOFT) 50 MG tablet Take 1 tablet (50 mg total) by mouth daily. 30 tablet 0     Patient Stressors:    Patient Strengths:    Treatment Modalities: Medication Management, Group therapy, Case management,  1 to 1 session with clinician, Psychoeducation, Recreational therapy.   Physician Treatment Plan for Primary Diagnosis: Substance-induced psychotic disorder (HCC) Long Term Goal(s): Improvement in symptoms so as ready for discharge   Short Term Goals: Ability to identify changes in lifestyle to reduce recurrence of condition will improve Ability to verbalize feelings will improve Ability to disclose and discuss suicidal ideas Ability to demonstrate self-control will improve Ability  to identify and develop effective coping behaviors will improve Ability to maintain clinical measurements within normal limits will improve Compliance with prescribed medications will improve Ability to identify triggers associated with substance abuse/mental health issues will improve  Medication Management: Evaluate patient's response, side effects, and tolerance of medication regimen.  Therapeutic Interventions: 1 to 1 sessions, Unit Group sessions and Medication administration.  Evaluation of Outcomes: Progressing  Physician Treatment Plan for Secondary Diagnosis: Principal Problem:   Substance-induced psychotic disorder (HCC) Active Problems:   MDD (major depressive disorder), recurrent episode, moderate (HCC)  Long Term Goal(s): Improvement in symptoms so as ready for discharge   Short Term Goals: Ability to identify changes in lifestyle to reduce recurrence of condition will improve Ability to verbalize feelings will improve Ability to disclose and discuss suicidal ideas Ability to demonstrate self-control will improve Ability to identify and develop effective coping behaviors will improve Ability to maintain clinical measurements within normal limits will improve Compliance with prescribed medications will improve Ability to identify triggers associated with substance abuse/mental health issues will improve     Medication Management: Evaluate patient's response, side effects, and tolerance of medication regimen.  Therapeutic Interventions: 1 to 1 sessions, Unit Group sessions and Medication administration.  Evaluation of Outcomes: Progressing   RN Treatment Plan for Primary Diagnosis: Substance-induced psychotic disorder (HCC) Long Term Goal(s): Knowledge of disease and therapeutic regimen to maintain health will improve  Short Term Goals: Ability to demonstrate self-control, Ability to verbalize feelings will improve, Ability to disclose and discuss suicidal ideas, and  Compliance with prescribed medications will improve  Medication Management: RN will administer medications as ordered by provider, will assess and evaluate patient's response and provide education to patient for prescribed medication. RN will report any adverse and/or side effects to prescribing provider.  Therapeutic Interventions: 1 on 1 counseling sessions, Psychoeducation, Medication administration, Evaluate responses to treatment, Monitor vital signs and CBGs as ordered, Perform/monitor CIWA, COWS, AIMS and Fall Risk screenings as ordered, Perform wound care treatments as ordered.  Evaluation of Outcomes: Progressing   LCSW Treatment Plan for Primary Diagnosis: Substance-induced psychotic disorder [facility]) Long Term Goal(s): Safe transition to appropriate next level of care at discharge, Engage patient in therapeutic group addressing interpersonal concerns.  Short Term Goals: Engage patient in aftercare planning with referrals and resources, Increase social support, Increase ability to appropriately verbalize feelings, and Increase emotional regulation  Therapeutic Interventions: Assess for all discharge needs, 1 to 1 time with Social worker, Explore available resources and support systems, Assess for adequacy in community support network, Educate family and significant other(s) on suicide prevention, Complete Psychosocial Assessment, Interpersonal group therapy.  Evaluation of Outcomes: Progressing   Progress in Treatment: Attending groups: No. Participating in groups: No.  Taking medication as prescribed: Yes. Toleration medication: Yes. Family/Significant other contact made: No, will contact: Pt declined consents Patient understands diagnosis: Yes. Discussing patient identified problems/goals with staff: Yes. Medical problems stabilized or resolved: Yes. Denies suicidal/homicidal ideation: No Issues/concerns per patient self-inventory: No.   New problem(s) identified: No, Describe:   none   New Short Term/Long Term Goal(s): medication stabilization, elimination of SI thoughts, development of comprehensive mental wellness plan.      Patient Goals:  "Lower my depression"   Discharge Plan or Barriers: Patient recently admitted. CSW will continue to follow and assess for appropriate referrals and possible discharge planning.      Reason for Continuation of Hospitalization: Depression Hallucinations Medication stabilization Suicidal ideation   Estimated Length of Stay: 5-7 days    Last 3 [place] Suicide Severity Risk Score: Flowsheet Row [facility]/2023 [facility] Admission (Discharged) from 08/11/2023 in [facility] ([facility])  C-SSRS RISK CATEGORY High Risk High Risk Error: Q7 should not be populated when Q6 is No       Last PHQ 2/9 Scores:    03/29/2023    8:45 AM 12/28/2022   10:49 AM 12/23/2022   10:38 AM  Depression screen PHQ 2/9  Decreased Interest 2 2 0  Down, Depressed, Hopeless 0 3 0  PHQ - 2 Score 2 5 0  Altered sleeping 3 2   Tired, decreased energy 0 3   Change in appetite 0 3   Feeling bad or failure about yourself  0 3   Trouble concentrating 0 3   Moving slowly or fidgety/restless 0 3   Suicidal thoughts 2 2   PHQ-9 Score 7 24   Difficult doing work/chores Somewhat difficult Somewhat difficult     Scribe for Treatment Team: Vernestine Gondola, LCSW 09/13/2023 9:45 AM

## 2023-09-13 NOTE — Progress Notes (Signed)
 Acknowledging chaplain consult for Lee Bridges.  I was unable to see him today, but spoke with his nurse and will plan to see him later this week before his discharge.  If he is being discharged sooner than expected, please page the chaplain on-call and we will come to speak to him prior to that.    [address] Bcc Pager, [phone]

## 2023-09-13 NOTE — Plan of Care (Signed)
   Problem: Activity: Goal: Interest or engagement in activities will improve Outcome: Progressing

## 2023-09-13 NOTE — Plan of Care (Signed)
  Problem: Education: Goal: Mental status will improve Outcome: Progressing Goal: Verbalization of understanding the information provided will improve Outcome: Progressing   Problem: Activity: Goal: Sleeping patterns will improve Outcome: Progressing   Problem: Health Behavior/Discharge Planning: Goal: Identification of resources available to assist in meeting health care needs will improve Outcome: Progressing   Problem: Safety: Goal: Periods of time without injury will increase Outcome: Progressing

## 2023-09-13 NOTE — Progress Notes (Signed)
   09/13/23 2115  Psych Admission Type (Psych Patients Only)  Admission Status Voluntary  Psychosocial Assessment  Patient Complaints Depression;Anxiety  Eye Contact Fair  Facial Expression Animated  Affect Labile  Speech Aggressive;Logical/coherent  Interaction Attention-seeking  Motor Activity Other (Comment) (WDL)  Appearance/Hygiene Unremarkable  Behavior Characteristics Cooperative  Mood Anxious  Thought Process  Coherency WDL  Content WDL  Delusions None reported or observed  Perception WDL  Hallucination None reported or observed  Judgment WDL  Confusion None  Danger to Self  Current suicidal ideation? Denies  Danger to Others  Danger to Others None reported or observed

## 2023-09-13 NOTE — Group Note (Unsigned)
 Date:  09/13/2023 Time:  9:46 AM  Group Topic/Focus:  Goals Group:   The focus of this group is to help patients establish daily goals to achieve during treatment and discuss how the patient can incorporate goal setting into their daily lives to aide in recovery. Orientation:   The focus of this group is to educate the patient on the purpose and policies of crisis stabilization and provide a format to answer questions about their admission.  The group details unit policies and expectations of patients while admitted.     Participation Level:  {BHH PARTICIPATION WUJWJ:19147}  Participation Quality:  {BHH PARTICIPATION QUALITY:22265}  Affect:  {BHH AFFECT:22266}  Cognitive:  {BHH COGNITIVE:22267}  Insight: {BHH Insight2:20797}  Engagement in Group:  {BHH ENGAGEMENT IN WGNFA:21308}  Modes of Intervention:  {BHH MODES OF INTERVENTION:22269}  Additional Comments:  ***  Lee Bridges 09/13/2023, 9:46 AM

## 2023-09-13 NOTE — Plan of Care (Signed)
   Problem: Education: Goal: Emotional status will improve Outcome: Progressing Goal: Mental status will improve Outcome: Progressing

## 2023-09-13 NOTE — Progress Notes (Signed)
 [facility] MD Progress Note  09/13/2023 9:55 AM Lee Bridges  MRN:  [id number]  Per MAR review patient is compliant with medications on the unit PR on Robaxin being used average once to twice daily, as needed Atarax last used 4/13.  Subjective:   Patient reports admission was triggered by having SI "because I am homeless and using cocaine to cope" he reports SI since March with some worsening led to this admission he continues to report passive SI wishing self dead feeling hopeless and worthless he is unable to contract for safety outside the hospital "if I leave here without a place to go you will see me on the news" he is interested in referral to inpatient residential rehab but unfortunately he was rejected from [facility], [place] and [facility].  He reports noncompliance with HIV medications mainly secondary to homelessness and having no transportation for outpatient follow-up visits.  He is compliant with medications on the unit Patient denies HI, reports vague VH seeing the curtain moving last night but denies AH since in the hospital.  He reports poor social support in the area having no family members or friends except "family members who use drugs" he tells me he attempted suicide twice before first time in 2022 by putting a trash bag on his head while he was residing at a hotel, last time 1 month ago when held a razor blade to his neck but his sister heard him talking about it and took him to the emergency room for help per his report. Discussed with patient importance to attend groups during his admission to address coping skills with stressors.  Unfortunately at this time patient unable to contract for safety outside the hospital and continues to report active SI if discharged.  Will follow-up with social worker regard discharge planning and rehab treatment. He is compliant with his medications on the unit including Trintellix and denies side effects, discussed with patient titrating Trintellix to 20 mg daily  starting tomorrow, he agrees.  Principal Problem: Substance-induced psychotic disorder (HCC) Diagnosis: Principal Problem:   Substance-induced psychotic disorder (HCC) Active Problems:   MDD (major depressive disorder), recurrent episode, moderate (HCC)  Total Time spent with patient: 30 minutes  Past Psychiatric History:   Past Medical History:  Past Medical History:  Diagnosis Date   Depression    HIV (human immunodeficiency virus infection) (HCC)    Psychosis (HCC)    Substance abuse (HCC)    History reviewed. No pertinent surgical history. Family History:  Family History  Problem Relation Age of Onset   Arthritis Mother    Diabetes Father    Hypertension Father    Family Psychiatric  History: denies Social History:  Social History   Substance and Sexual Activity  Alcohol Use Not Currently   Comment: socially     Social History   Substance and Sexual Activity  Drug Use Yes   Types: Cocaine, Marijuana, Methamphetamines   Comment: weekly    Social History   Socioeconomic History   Marital status: Single    Spouse name: Not on file   Number of children: 0   Years of education: Not on file   Highest education level: Associate degree: academic program  Occupational History   Not on file  Tobacco Use   Smoking status: Every Day    Current packs/day: 0.50    Types: Cigarettes   Smokeless tobacco: Never   Tobacco comments:    1 PPD  Substance and Sexual Activity   Alcohol use:  Not Currently    Comment: socially   Drug use: Yes    Types: Cocaine, Marijuana, Methamphetamines    Comment: weekly   Sexual activity: Not Currently  Other Topics Concern   Not on file  Social History Narrative   Homeless x 2 yrs.    Social Drivers of Corporate investment banker Strain: Not on file  Food Insecurity: Food Insecurity Present (09/06/2023)   Hunger Vital Sign    Worried About Running Out of Food in the Last Year: Often true    Ran Out of Food in the Last Year:  Often true  Transportation Needs: Unmet Transportation Needs (09/06/2023)   PRAPARE - Administrator, Civil Service (Medical): Yes    Lack of Transportation (Non-Medical): Yes  Physical Activity: Not on file  Stress: Not on file  Social Connections: Socially Isolated (08/09/2023)   Social Connection and Isolation Panel [NHANES]    Frequency of Communication with Friends and Family: Never    Frequency of Social Gatherings with Friends and Family: Never    Attends Religious Services: Never    Database administrator or Organizations: No    Attends Engineer, structural: Never    Marital Status: Never married   Additional Social History:                         Sleep: Fair  Appetite:  Fair  Current Medications: Current Facility-Administered Medications  Medication Dose Route Frequency Provider Last Rate Last Admin   acetaminophen (TYLENOL) tablet 650 mg  650 mg Oral Q6H PRN Eligha Bridegroom, NP   650 mg at 09/12/23 1958   alum & mag hydroxide-simeth (MAALOX/MYLANTA) 200-200-20 MG/5ML suspension 30 mL  30 mL Oral Q4H PRN Eligha Bridegroom, NP   30 mL at 09/09/23 1114   atovaquone (MEPRON) 750 MG/5ML suspension 1,500 mg  1,500 mg Oral Q breakfast Zouev, Dmitri, MD   1,500 mg at 09/13/23 0733   bictegravir-emtricitabine-tenofovir AF (BIKTARVY) 50-200-25 MG per tablet 1 tablet  1 tablet Oral Daily Blanchard Kelch, NP   1 tablet at 09/13/23 0734   haloperidol (HALDOL) tablet 5 mg  5 mg Oral TID PRN Eligha Bridegroom, NP   5 mg at 09/06/23 2015   And   diphenhydrAMINE (BENADRYL) capsule 50 mg  50 mg Oral TID PRN Eligha Bridegroom, NP   50 mg at 09/10/23 2151   haloperidol lactate (HALDOL) injection 5 mg  5 mg Intramuscular TID PRN Eligha Bridegroom, NP       And   diphenhydrAMINE (BENADRYL) injection 50 mg  50 mg Intramuscular TID PRN Eligha Bridegroom, NP       And   LORazepam (ATIVAN) injection 2 mg  2 mg Intramuscular TID PRN Eligha Bridegroom, NP        haloperidol lactate (HALDOL) injection 10 mg  10 mg Intramuscular TID PRN Eligha Bridegroom, NP       And   diphenhydrAMINE (BENADRYL) injection 50 mg  50 mg Intramuscular TID PRN Eligha Bridegroom, NP       And   LORazepam (ATIVAN) injection 2 mg  2 mg Intramuscular TID PRN Eligha Bridegroom, NP       diphenhydrAMINE-zinc acetate (BENADRYL) 2-0.1 % cream   Topical BID PRN Ajibola, Ene A, NP       hydrOXYzine (ATARAX) tablet 25 mg  25 mg Oral TID PRN Eligha Bridegroom, NP   25 mg at 09/11/23 1835   magnesium hydroxide (  MILK OF MAGNESIA) suspension 30 mL  30 mL Oral Daily PRN Eligha Bridegroom, NP       methocarbamol (ROBAXIN) tablet 500 mg  500 mg Oral Q6H PRN Miguel Rota, MD   500 mg at 09/12/23 2026   nicotine (NICODERM CQ - dosed in mg/24 hours) patch 14 mg  14 mg Transdermal Daily Miguel Rota, MD   14 mg at 09/13/23 1610   nystatin cream (MYCOSTATIN)   Topical BID Miguel Rota, MD   1 Application at 09/13/23 0734   QUEtiapine (SEROQUEL XR) 24 hr tablet 400 mg  400 mg Oral QHS Zouev, Dmitri, MD       vortioxetine HBr (TRINTELLIX) tablet 10 mg  10 mg Oral Daily Miguel Rota, MD   10 mg at 09/13/23 0734    Lab Results:  Results for orders placed or performed during the hospital encounter of 09/06/23 (from the past 48 hours)  Basic metabolic panel     Status: None   Collection Time: 09/13/23  6:23 AM  Result Value Ref Range   Sodium 137 135 - 145 mmol/L   Potassium 4.0 3.5 - 5.1 mmol/L   Chloride 101 98 - 111 mmol/L   CO2 23 22 - 32 mmol/L   Glucose, Bld 97 70 - 99 mg/dL    Comment: Glucose reference range applies only to samples taken after fasting for at least 8 hours.   BUN 10 6 - 20 mg/dL   Creatinine, Ser 9.60 0.61 - 1.24 mg/dL   Calcium 9.1 8.9 - 45.4 mg/dL   GFR, Estimated >09 >81 mL/min    Comment: (NOTE) Calculated using the CKD-EPI Creatinine Equation (2021)    Anion gap 13 5 - 15    Comment: Performed at [facility], [address], [place],  [place] [address]  CBC with Differential/Platelet     Status: Abnormal   Collection Time: 09/13/23  6:23 AM  Result Value Ref Range   WBC 4.0 4.0 - 10.5 K/uL   RBC 3.39 (L) 4.22 - 5.81 MIL/uL   Hemoglobin 11.9 (L) 13.0 - 17.0 g/dL   HCT 82.9 (L) 56.2 - 13.0 %   MCV 105.9 (H) 80.0 - 100.0 fL   MCH 35.1 (H) 26.0 - 34.0 pg   MCHC 33.1 30.0 - 36.0 g/dL   RDW 86.5 78.4 - 69.6 %   Platelets 254 150 - 400 K/uL   nRBC 0.0 0.0 - 0.2 %   Neutrophils Relative % 20 %   Neutro Abs 0.8 (L) 1.7 - 7.7 K/uL   Lymphocytes Relative 50 %   Lymphs Abs 2.1 0.7 - 4.0 K/uL   Monocytes Relative 12 %   Monocytes Absolute 0.5 0.1 - 1.0 K/uL   Eosinophils Relative 15 %   Eosinophils Absolute 0.6 (H) 0.0 - 0.5 K/uL   Basophils Relative 1 %   Basophils Absolute 0.0 0.0 - 0.1 K/uL   WBC Morphology Mild Left Shift (1-5% metas, occ myelo)    Smear Review Normal platelet morphology    Immature Granulocytes 2 %   Abs Immature Granulocytes 0.06 0.00 - 0.07 K/uL   Reactive, Benign Lymphocytes PRESENT     Comment: Performed at [facility], [address] [place], [place] [address]     Blood Alcohol level:  Lab Results  Component Value Date   [facility] <10 09/05/2023   ETH <10 08/08/2023    Metabolic Disorder Labs: Lab Results  Component Value Date   HGBA1C 4.6 (L) 08/12/2023  MPG 85.32 08/12/2023   No results found for: "PROLACTIN" Lab Results  Component Value Date   CHOL 167 08/12/2023   TRIG 239 (H) 08/12/2023   HDL 30 (L) 08/12/2023   CHOLHDL 5.6 08/12/2023   VLDL 48 (H) 08/12/2023   LDLCALC 89 08/12/2023     Musculoskeletal: Strength & Muscle Tone: within normal limits Gait & Station: normal Patient leans: N/A  Psychiatric Specialty Exam:  Presentation  General Appearance:  Appropriate for Environment  Eye Contact: Fair  Speech: Clear and Coherent  Speech Volume: Normal  Handedness: Right   Mood and Affect  Mood: Euthymic,  "better"  Affect: Appropriate   Thought Process  Thought Processes: Coherent  Descriptions of Associations:Intact  Orientation:Full (Time, Place and Person)  Thought Content:Logical  History of Schizophrenia/Schizoaffective disorder:No  Duration of Psychotic Symptoms:Greater than six months  Hallucinations:patient reports AH of multiple voices noncommand in nature but has not been observed to be RTIS  Ideas of Reference:None  Suicidal Thoughts:denies SI  Homicidal Thoughts:denies HI   Sensorium  Memory: Immediate Fair  Judgment: Fair  Insight: Fair   Art therapist  Concentration: Fair  Attention Span: Fair  Recall: [employer] of Knowledge: Fair  Language: Fair   Psychomotor Activity  Psychomotor Activity: No data recorded   Assets  Assets: Communication Skills   Sleep  Sleep: fair    Physical Exam: Physical exam: Please see exam on admit note. General: Well developed, well nourished.  Pupils: Normal at 3mm Respiratory: Breathing is unlabored.  Cardiovascular: No edema.  Language: No anomia, no aphasia Muscle strength and tone-pt moving all extremities.  Gait not assessed as pt remained in bed.  Neuro: Facial muscles are symmetric. Pt without tremor, no evidence of hyperarousal.  Review of Systems  Constitutional: Negative.   HENT: Negative.    Eyes: Negative.   Respiratory: Negative.    Cardiovascular: Negative.   Gastrointestinal: Negative.   Genitourinary: Negative.   Musculoskeletal: Negative.   Skin: Negative.   Neurological:  Positive for tingling.  Endo/Heme/Allergies: Negative.   Psychiatric/Behavioral:  Positive for depression and suicidal ideas.   All other systems reviewed and are negative.  Blood pressure 128/86, pulse (!) 105, temperature 98 F (36.7 C), temperature source Oral, resp. rate 14, height 5\' 2"  (1.575 m), weight 57.2 kg, SpO2 100%. Body mass index is 23.05 kg/m.   Treatment Plan  Summary:   Daily contact with patient to assess and evaluate symptoms and progress in treatment, Medication management, and Plan:  - Continue Seroquel XR 400  mg at bedtime which patient has historically had a positive response to per documentation and has been less agitated with increase with less mood lability, no longer threatening to kill himself or kill his roommate and reports benefit from medication - Titrate Trintellix from 10 to 20 mg daily for MDD as patient reports possible prior benefit  Observation Level/Precautions:  15 minute checks  Laboratory:  CBC Chemistry Profile UDS UA Vitamin B-12  Psychotherapy:  will attend group therapy  Medications:  Seroquel and Rexulti  Consultations:  curbside consult with ID (ordered CD4 counts as well as HIV testing)  Discharge Concerns:  needs residential rehab or sober living and stable living environment as well as likely ACTS services  Estimated LOS:3-5 days  Other:       Medical Consult: curbside consult with outpatient provider Patient had CD4 drawn but not RPR or HIV-RNA so I reordered labs and will followup Vital signs stable  Reached out to [employer]  regarding patient's lab results and will followup: CD4:258, RPR (reactive, titer 1:32); The HIV RNA lab looks like is still pending  See noted from 4/10 with following recs: "In review of his chart and records I Adriane't feel strongly he needs an additional dose of the bicillin as his infection appears to be early between July 2024 and October 2024.    I will repeat a RPR, CD4 and HIV RNA level (last one was 196,000 copies in February).    Continue biktarvy once daily, regarding pneumocystis prophylaxis he can continue either Bactrim 1 SS daily or Atovaquone 1500 mg daily. 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 That was a bit of an overkill recommendation - which is OK to be over cautious sometimes.  His CD4 is better - I would say he should continue the atovaquone another 3 months then we can stop it IF he stays on his Biktarvy more consistently - which he needs to continue of course lifelong.  I will take a peak at the viral load Monday "     Physician Treatment Plan for Primary Diagnosis: Substance-induced psychotic disorder [facility]) Long Term Goal(s): Improvement in symptoms so as ready for discharge   Short Term Goals: Ability to identify changes in lifestyle to reduce recurrence of condition will improve, Ability to verbalize feelings will improve, Ability to disclose and discuss suicidal ideas, Ability to demonstrate self-control will improve, Ability to identify and develop effective coping behaviors will improve, Ability to maintain clinical measurements within normal limits will improve, Compliance with prescribed medications will improve, and Ability to identify triggers associated with substance abuse/mental health issues will improve   Physician Treatment Plan for Secondary Diagnosis: Principal Problem:   Substance-induced psychotic disorder (HCC) Active Problems:   MDD (major depressive disorder), recurrent episode, moderate (HCC)   Long Term Goal(s): Improvement in symptoms so as ready for discharge   Short Term Goals: Ability to identify changes in lifestyle to reduce recurrence of condition will improve, Ability to verbalize feelings will improve, Ability to disclose and discuss suicidal ideas, Ability to demonstrate self-control will improve, Ability to identify and develop effective coping behaviors will improve, Ability to maintain clinical measurements within normal limits will improve, and Compliance with prescribed medications will improve   I certify that inpatient services  furnished can reasonably be expected to improve the patient's condition.       Arna Luis Linnie Riches, MD 09/13/2023, 9:55 AM

## 2023-09-13 NOTE — Progress Notes (Signed)
   09/12/23 2015  Psych Admission Type (Psych Patients Only)  Admission Status Voluntary  Psychosocial Assessment  Patient Complaints Anxiety;Depression  Eye Contact Fair  Facial Expression Animated  Affect Appropriate to circumstance  Speech Logical/coherent  Interaction Attention-seeking  Motor Activity Other (Comment) (WNL)  Appearance/Hygiene Unremarkable  Behavior Characteristics Cooperative  Mood Pleasant  Thought Process  Coherency WDL  Content WDL  Delusions None reported or observed  Perception WDL  Hallucination None reported or observed  Judgment WDL  Confusion None  Danger to Self  Current suicidal ideation? Denies  Description of Suicide Plan None  Self-Injurious Behavior No self-injurious ideation or behavior indicators observed or expressed   Agreement Not to Harm Self Yes  Description of Agreement Verbal Contract  Danger to Others  Danger to Others None reported or observed

## 2023-09-14 DIAGNOSIS — F19959 Other psychoactive substance use, unspecified with psychoactive substance-induced psychotic disorder, unspecified: Secondary | ICD-10-CM | POA: Diagnosis not present

## 2023-09-14 NOTE — Plan of Care (Signed)
 Pt presents with animated expression and labile affect. Denies SI, HI, AVH, and pain. Cooperative and assertive in interactions with staff. Pt was observed in the dayroom participating in afternoon group. Medication compliant with no adverse reactions. Safety checks maintained at q 15 minutes. Support, encouragement, and reassurance offered to the pt.   Problem: Education: Goal: Emotional status will improve Outcome: Progressing Goal: Mental status will improve Outcome: Progressing Goal: Verbalization of understanding the information provided will improve Outcome: Progressing   Problem: Safety: Goal: Periods of time without injury will increase Outcome: Progressing

## 2023-09-14 NOTE — Progress Notes (Signed)
 During my follow up assessment of the patient, the patient presented with moderate anxiety and was tearful. Patient was offered nourishment and hydration.  Administered PRN Hydroxyzine per MAR\ per patient request.  Patient denies SI/HI/AVH.  Patient is safe on this unit with q15 minute safety checks.

## 2023-09-14 NOTE — Progress Notes (Signed)
 [facility] MD Progress Note  09/14/2023 10:39 AM Lee Bridges  MRN:  [id number]  Per MAR review patient is compliant with medications on the unit PRN Robaxin being used average once to twice daily, as needed Atarax last used 4/15.  Subjective:   Upon assessment today patient reports had a very good day yesterday, reports attending groups and interacting in the milieu.  He was reported by staff to be occasionally irritable and assaultive to staff but redirectable.  Patient reports fair sleep and appetite, denies passive or active SI intention or plan today but continues to report fear of relapse and decompensation after discharge given he is homeless and has no place to go to.  Unfortunately patient has been declined from multiple rehab facilities and we are awaiting responses from some.  He was encouraged to continue to contact [facility] programs and will follow.  He denies side effect to medications and agrees to comply after discharge.  He denies HI or AVH.  He does report on and off depressed mood and anxiety mainly related to his social situation being homeless but he presents linear fluent and pleasant.    Principal Problem: Substance-induced psychotic disorder (HCC) Diagnosis: Principal Problem:   Substance-induced psychotic disorder (HCC) Active Problems:   MDD (major depressive disorder), recurrent episode, moderate (HCC)  Total Time spent with patient: 30 minutes  Past Psychiatric History:   Past Medical History:  Past Medical History:  Diagnosis Date   Depression    HIV (human immunodeficiency virus infection) (HCC)    Psychosis (HCC)    Substance abuse (HCC)    History reviewed. No pertinent surgical history. Family History:  Family History  Problem Relation Age of Onset   Arthritis Mother    Diabetes Father    Hypertension Father    Family Psychiatric  History: denies Social History:  Social History   Substance and Sexual Activity  Alcohol Use Not Currently   Comment:  socially     Social History   Substance and Sexual Activity  Drug Use Yes   Types: Cocaine, Marijuana, Methamphetamines   Comment: weekly    Social History   Socioeconomic History   Marital status: Single    Spouse name: Not on file   Number of children: 0   Years of education: Not on file   Highest education level: Associate degree: academic program  Occupational History   Not on file  Tobacco Use   Smoking status: Every Day    Current packs/day: 0.50    Types: Cigarettes   Smokeless tobacco: Never   Tobacco comments:    1 PPD  Substance and Sexual Activity   Alcohol use: Not Currently    Comment: socially   Drug use: Yes    Types: Cocaine, Marijuana, Methamphetamines    Comment: weekly   Sexual activity: Not Currently  Other Topics Concern   Not on file  Social History Narrative   Homeless x 2 yrs.    Social Drivers of Corporate investment banker Strain: Not on file  Food Insecurity: Food Insecurity Present (09/06/2023)   Hunger Vital Sign    Worried About Running Out of Food in the Last Year: Often true    Ran Out of Food in the Last Year: Often true  Transportation Needs: Unmet Transportation Needs (09/06/2023)   PRAPARE - Administrator, Civil Service (Medical): Yes    Lack of Transportation (Non-Medical): Yes  Physical Activity: Not on file  Stress: Not on file  Social Connections: Socially Isolated (08/09/2023)   Social Connection and Isolation Panel [NHANES]    Frequency of Communication with Friends and Family: Never    Frequency of Social Gatherings with Friends and Family: Never    Attends Religious Services: Never    Diplomatic Services operational officer: No    Attends Engineer, structural: Never    Marital Status: Never married   Additional Social History:                         Sleep: Fair  Appetite:  Fair  Current Medications: Current Facility-Administered Medications  Medication Dose Route Frequency  Provider Last Rate Last Admin   acetaminophen (TYLENOL) tablet 650 mg  650 mg Oral Q6H PRN Roise Cleaver, NP   650 mg at 09/13/23 1837   alum & mag hydroxide-simeth (MAALOX/MYLANTA) 200-200-20 MG/5ML suspension 30 mL  30 mL Oral Q4H PRN Roise Cleaver, NP   30 mL at 09/09/23 1114   atovaquone (MEPRON) 750 MG/5ML suspension 1,500 mg  1,500 mg Oral Q breakfast Zouev, Dmitri, MD   1,500 mg at 09/14/23 0756   bictegravir-emtricitabine-tenofovir AF (BIKTARVY) 50-200-25 MG per tablet 1 tablet  1 tablet Oral Daily Orson Blalock, NP   1 tablet at 09/14/23 0759   haloperidol (HALDOL) tablet 5 mg  5 mg Oral TID PRN Roise Cleaver, NP   5 mg at 09/06/23 2015   And   diphenhydrAMINE (BENADRYL) capsule 50 mg  50 mg Oral TID PRN Roise Cleaver, NP   50 mg at 09/10/23 2151   haloperidol lactate (HALDOL) injection 5 mg  5 mg Intramuscular TID PRN Roise Cleaver, NP       And   diphenhydrAMINE (BENADRYL) injection 50 mg  50 mg Intramuscular TID PRN Roise Cleaver, NP       And   LORazepam (ATIVAN) injection 2 mg  2 mg Intramuscular TID PRN Roise Cleaver, NP       haloperidol lactate (HALDOL) injection 10 mg  10 mg Intramuscular TID PRN Roise Cleaver, NP       And   diphenhydrAMINE (BENADRYL) injection 50 mg  50 mg Intramuscular TID PRN Roise Cleaver, NP       And   LORazepam (ATIVAN) injection 2 mg  2 mg Intramuscular TID PRN Roise Cleaver, NP       diphenhydrAMINE-zinc acetate (BENADRYL) 2-0.1 % cream   Topical BID PRN Ajibola, Ene A, NP       hydrOXYzine (ATARAX) tablet 25 mg  25 mg Oral TID PRN Roise Cleaver, NP   25 mg at 09/13/23 1837   magnesium hydroxide (MILK OF MAGNESIA) suspension 30 mL  30 mL Oral Daily PRN Roise Cleaver, NP       methocarbamol (ROBAXIN) tablet 500 mg  500 mg Oral Q6H PRN Zouev, Dmitri, MD   500 mg at 09/13/23 1837   nicotine (NICODERM CQ - dosed in mg/24 hours) patch 14 mg  14 mg Transdermal Daily Zouev, Dmitri, MD   14 mg at 09/13/23 0734    nystatin cream (MYCOSTATIN)   Topical BID Zouev, Dmitri, MD   1 Application at 09/14/23 0759   QUEtiapine (SEROQUEL XR) 24 hr tablet 400 mg  400 mg Oral QHS Zouev, Dmitri, MD   400 mg at 09/13/23 2115   vortioxetine HBr (TRINTELLIX) tablet 20 mg  20 mg Oral Daily Linnie Riches, Leighanne Adolph, MD   20 mg at 09/14/23 0759    Lab Results:  Results  for orders placed or performed during the hospital encounter of 09/06/23 (from the past 48 hours)  Basic metabolic panel     Status: None   Collection Time: 09/13/23  6:23 AM  Result Value Ref Range   Sodium 137 135 - 145 mmol/L   Potassium 4.0 3.5 - 5.1 mmol/L   Chloride 101 98 - 111 mmol/L   CO2 23 22 - 32 mmol/L   Glucose, Bld 97 70 - 99 mg/dL    Comment: Glucose reference range applies only to samples taken after fasting for at least 8 hours.   BUN 10 6 - 20 mg/dL   Creatinine, Ser 2.84 0.61 - 1.24 mg/dL   Calcium 9.1 8.9 - 13.2 mg/dL   GFR, Estimated >44 >01 mL/min    Comment: (NOTE) Calculated using the CKD-EPI Creatinine Equation (2021)    Anion gap 13 5 - 15    Comment: Performed at [facility], [address] [place], [place] [address]  CBC with Differential/Platelet     Status: Abnormal   Collection Time: 09/13/23  6:23 AM  Result Value Ref Range   WBC 4.0 4.0 - 10.5 K/uL   RBC 3.39 (L) 4.22 - 5.81 MIL/uL   Hemoglobin 11.9 (L) 13.0 - 17.0 g/dL   HCT 36.6 (L) 44.0 - 34.7 %   MCV 105.9 (H) 80.0 - 100.0 fL   MCH 35.1 (H) 26.0 - 34.0 pg   MCHC 33.1 30.0 - 36.0 g/dL   RDW 42.5 95.6 - 38.7 %   Platelets 254 150 - 400 K/uL   nRBC 0.0 0.0 - 0.2 %   Neutrophils Relative % 20 %   Neutro Abs 0.8 (L) 1.7 - 7.7 K/uL   Lymphocytes Relative 50 %   Lymphs Abs 2.1 0.7 - 4.0 K/uL   Monocytes Relative 12 %   Monocytes Absolute 0.5 0.1 - 1.0 K/uL   Eosinophils Relative 15 %   Eosinophils Absolute 0.6 (H) 0.0 - 0.5 K/uL   Basophils Relative 1 %   Basophils Absolute 0.0 0.0 - 0.1 K/uL   WBC Morphology Mild Left Shift (1-5% metas, occ  myelo)    Smear Review Normal platelet morphology    Immature Granulocytes 2 %   Abs Immature Granulocytes 0.06 0.00 - 0.07 K/uL   Reactive, Benign Lymphocytes PRESENT     Comment: Performed at [facility], [address], [place], [place] [address]     Blood Alcohol level:  Lab Results  Component Value Date   [facility] <10 09/05/2023   ETH <10 08/08/2023    Metabolic Disorder Labs: Lab Results  Component Value Date   HGBA1C 4.6 (L) 08/12/2023   MPG 85.32 08/12/2023   No results found for: "PROLACTIN" Lab Results  Component Value Date   CHOL 167 08/12/2023   TRIG 239 (H) 08/12/2023   HDL 30 (L) 08/12/2023   CHOLHDL 5.6 08/12/2023   VLDL 48 (H) 08/12/2023   LDLCALC 89 08/12/2023     Musculoskeletal: Strength & Muscle Tone: within normal limits Gait & Station: normal Patient leans: N/A  Psychiatric Specialty Exam:  Presentation  General Appearance:  Appropriate for Environment  Eye Contact: Fair  Speech: Clear and Coherent  Speech Volume: Normal  Handedness: Right   Mood and Affect  Mood: Euthymic, "better"  Affect: Appropriate   Thought Process  Thought Processes: Coherent  Descriptions of Associations:Intact  Orientation:Full (Time, Place and Person)  Thought Content:Logical  History of Schizophrenia/Schizoaffective disorder:No  Duration of Psychotic Symptoms:Greater  than six months  Hallucinations:patient reports AH of multiple voices noncommand in nature but has not been observed to be RTIS  Ideas of Reference:None  Suicidal Thoughts:denies SI  Homicidal Thoughts:denies HI   Sensorium  Memory: Immediate Fair  Judgment: Fair  Insight: Fair   Chartered certified accountant: Fair  Attention Span: Fair  Recall: [employer] of Knowledge: Fair  Language: Fair   Psychomotor Activity  Psychomotor Activity: No data recorded   Assets  Assets: Communication Skills   Sleep   Sleep: fair    Physical Exam: Physical exam: Please see exam on admit note. General: Well developed, well nourished.  Pupils: Normal at 3mm Respiratory: Breathing is unlabored.  Cardiovascular: No edema.  Language: No anomia, no aphasia Muscle strength and tone-pt moving all extremities.  Gait not assessed as pt remained in bed.  Neuro: Facial muscles are symmetric. Pt without tremor, no evidence of hyperarousal.  Review of Systems  Constitutional: Negative.   HENT: Negative.    Eyes: Negative.   Respiratory: Negative.    Cardiovascular: Negative.   Gastrointestinal: Negative.   Genitourinary: Negative.   Musculoskeletal: Negative.   Skin: Negative.   Neurological: Negative.   Endo/Heme/Allergies: Negative.   Psychiatric/Behavioral: Negative.    All other systems reviewed and are negative.  Blood pressure 135/79, pulse (!) 114, temperature 98 F (36.7 C), temperature source Oral, resp. rate 14, height 5\' 2"  (1.575 m), weight 57.2 kg, SpO2 100%. Body mass index is 23.05 kg/m.   Treatment Plan Summary:   Daily contact with patient to assess and evaluate symptoms and progress in treatment, Medication management, and Plan:  - Continue Seroquel XR 400  mg at bedtime which patient has historically had a positive response to per documentation and has been less agitated with increase with less mood lability, no longer threatening to kill himself or kill his roommate and reports benefit from medication - Continue Trintellix 20 mg daily for MDD as patient reports possible prior benefit  Observation Level/Precautions:  15 minute checks  Laboratory:  CBC Chemistry Profile UDS UA Vitamin B-12  Psychotherapy:  will attend group therapy  Medications:  Seroquel and Trintellix  Consultations:  curbside consult with ID (ordered CD4 counts as well as HIV testing)  Discharge Concerns: Will continue to attempt referral to sober living or rehab treatment, will need outpatient follow-up  with infectious disease provider for HIV  Estimated LOS:3-5 days  Other:       Medical Consult: curbside consult with outpatient provider Patient had CD4 drawn but not RPR or HIV-RNA so I reordered labs and will followup Vital signs stable  Reached out to [facility] regarding patient's lab results and will followup: CD4:258, RPR (reactive, titer 1:32); The HIV RNA lab looks like is still pending  See noted from 4/10 with following recs: "In review of his chart and records I Chiron't feel strongly he needs an additional dose of the bicillin as his infection appears to be early between July 2024 and October 2024.    I will repeat a RPR, CD4 and HIV RNA level (last one was 196,000 copies in February).    Continue biktarvy once daily, regarding pneumocystis prophylaxis he can continue either Bactrim 1 SS daily or Atovaquone 1500 mg daily. His creatinine is normal but Lee Bridges does not want to "shut down his kidneys" per what he recalls from previous [facility] ID team."  4/12: curbsided ARNP dixon who notes: "the syphilis titer has already shown a > 4 fold reduction following  the treatment he has already had so even more comfortable with no further bicillin. That was a bit of an overkill recommendation - which is OK to be over cautious sometimes.  His CD4 is better - I would say he should continue the atovaquone another 3 months then we can stop it IF he stays on his Biktarvy more consistently - which he needs to continue of course lifelong.  I will take a peak at the viral load Monday "     Physician Treatment Plan for Primary Diagnosis: Substance-induced psychotic disorder [facility]) Long Term Goal(s): Improvement in symptoms so as ready for discharge   Short Term Goals: Ability to identify changes in lifestyle to reduce recurrence of condition will improve, Ability to verbalize feelings will improve, Ability to disclose and discuss suicidal ideas, Ability to demonstrate self-control will improve, Ability to  identify and develop effective coping behaviors will improve, Ability to maintain clinical measurements within normal limits will improve, Compliance with prescribed medications will improve, and Ability to identify triggers associated with substance abuse/mental health issues will improve   Physician Treatment Plan for Secondary Diagnosis: Principal Problem:   Substance-induced psychotic disorder (HCC) Active Problems:   MDD (major depressive disorder), recurrent episode, moderate (HCC)   Long Term Goal(s): Improvement in symptoms so as ready for discharge   Short Term Goals: Ability to identify changes in lifestyle to reduce recurrence of condition will improve, Ability to verbalize feelings will improve, Ability to disclose and discuss suicidal ideas, Ability to demonstrate self-control will improve, Ability to identify and develop effective coping behaviors will improve, Ability to maintain clinical measurements within normal limits will improve, and Compliance with prescribed medications will improve   I certify that inpatient services furnished can reasonably be expected to improve the patient's condition.       Sherrine Salberg Linnie Riches, MD 09/14/2023, 10:39 AM

## 2023-09-14 NOTE — Progress Notes (Signed)
 Markanthony Gedney was talking two different patients Mark room 404 and Frank 403 standing on the 300 hall talking. Writer explain you can not be on this hall if you are not assign to a room on 300 hall. Kartel Gotti begain to yell,  name calling using profanity,  slamming doors. RN talk to [employer].

## 2023-09-14 NOTE — Group Note (Signed)
 Date:  09/14/2023 Time:  9:42 AM  Group Topic/Focus:  Goals Group:   The focus of this group is to help patients establish daily goals to achieve during treatment and discuss how the patient can incorporate goal setting into their daily lives to aide in recovery.    Participation Level:  Did Not Attend   Lee Bridges 09/14/2023, 9:42 AM

## 2023-09-14 NOTE — Progress Notes (Signed)
 During this shift, patient presented with anxiety and depression.  Patient expressed desire to increase his dosage of Trintellix, reporting it to be effective at lasting throughout the day longer than other medications.  Medication administered.  Patient denies SI/HI/AVH.  The patient is safe on this unit with q15 minute safety checks.

## 2023-09-14 NOTE — Progress Notes (Signed)
   09/14/23 2209  Psych Admission Type (Psych Patients Only)  Admission Status Voluntary  Psychosocial Assessment  Patient Complaints Anger;Anxiety;Crying spells;Depression  Eye Contact Fair  Facial Expression Animated  Affect Labile  Speech Aggressive;Logical/coherent  Interaction Hostile  Motor Activity Other (Comment) (WDL)  Appearance/Hygiene Unremarkable  Behavior Characteristics Agressive verbally;Agitated  Mood Angry;Labile  Thought Process  Coherency WDL  Content WDL  Delusions None reported or observed  Perception WDL  Hallucination None reported or observed  Judgment WDL  Confusion None  Danger to Self  Current suicidal ideation? Denies  Danger to Others  Danger to Others Reported or observed  Danger to Others Abnormal  Harmful Behavior to others Threats of violence towards other people observed or expressed   Description of Harmful Behavior pt stated, "I'm gone slap that Bitch"  Destructive Behavior No threats or harm toward property

## 2023-09-14 NOTE — Progress Notes (Signed)
 Lee Bridges came to the nursing station began to talk about the day shift staff. Writer explain to [employer] we are not here to talk about the staff. Let's work on Pharmacologist and I can give you a journal to write.Lee Bridges said he has no glasses to read.Writer explain he can still work on Pharmacologist.

## 2023-09-14 NOTE — Group Note (Signed)
 Recreation Therapy Group Note   Group Topic:Other  Group Date: 09/14/2023 Start Time: 1400 End Time: 1440 Facilitators: Lawrencia Mauney-McCall, LRT,CTRS Location: 300 Hall Dayroom   Activity Description/Intervention: Therapeutic Drumming. Patients with peers and staff were given the opportunity to engage in a leader facilitated [facility] Empowerment Drumming Circle with staff from the [employer], in partnership with [employer]. Teaching laboratory technician and trained [employer], Kathlyne Parchment leading with LRT observing and documenting intervention and pt response. This evidenced-based practice targets 7 areas of health and wellbeing in the human experience including: stress-reduction, exercise, self-expression, camaraderie/support, nurturing, spirituality, and music-making (leisure).   Goal Area(s) Addresses:  Patient will engage in pro-social way in music group.  Patient will follow directions of drum leader on the first prompt. Patient will demonstrate no behavioral issues during group.  Patient will identify if a reduction in stress level occurs as a result of participation in therapeutic drum circle.    Education: Leisure exposure, Pharmacologist, Musical expression, Discharge Planning   Affect/Mood: Appropriate   Participation Level: Engaged   Participation Quality: Independent   Behavior: Appropriate   Speech/Thought Process: Focused   Insight: Good   Judgement: Good   Modes of Intervention: Teaching laboratory technician   Patient Response to Interventions:  Engaged   Education Outcome:  In group clarification offered    Clinical Observations/Individualized Feedback: Josian actively engaged in therapeutic drumming exercise and discussions. Pt was appropriate with peers, staff, and musical equipment for duration of programming.  Pt identified "depressed" as their feeling after participation in music-based programming. Pt affect congruent/incongruent with  verbalized emotion.    Plan: Continue to engage patient in RT group sessions 2-3x/week.   Earon Rivest-McCall, LRT,CTRS 09/14/2023 3:37 PM

## 2023-09-14 NOTE — Plan of Care (Signed)
   Problem: Education: Goal: Emotional status will improve Outcome: Not Progressing Goal: Mental status will improve Outcome: Not Progressing

## 2023-09-14 NOTE — Progress Notes (Signed)
   09/14/23 1425  Spiritual Encounters  Type of Visit Follow up  Care provided to: Patient  Referral source Patient request  Reason for visit Routine spiritual support  Spiritual Framework  Presenting Themes Meaning/purpose/sources of inspiration;Goals in life/care   Per patient request, I provided spiritual care support for Mr. Ethyl Hering.  Mr. Cleary engaged in some life review including some significant events that formed sense of self and meaning, including graduating from high school while unhoused and how this earned recognition. He discussed faith outlook and how Lynder Sanger faith is a source of meaning and coping.  I provided compassionate presence and active listening. I affirmed Dewight's faith and explored with him how he can make meaning out of his current experience. I invited reflection on boundary setting and around future goals. I offered words of encouragement and also offered support to help locate readers. Will continue to follow.  Chevon Laufer L. Minetta Aly, M.Div [phone]

## 2023-09-14 NOTE — Progress Notes (Signed)
 Patient was witnessed in hallway shouting, angry, and yelling profanity as other patients began trying to encourage him to calm down.  Writer approached the patient in the hallway to assess the situation.  The patient continued to shout and yell while being guided into his room.  The patient complained that "he is not going to be treated like a child."  The patient reportedly was upset that a patient belonging on another hall was asked to return to their appropriate hall by the M[facility].  Patient also complained saying, I haven't spoken to a doctor, I'm not leaving until my meds are right, I pay for yall to work here, I'm contacting the news, [employer] tomorrow and Human Resources with names and people will be fired."  Patient was especially angry towards Doris, MHT, stating "I'm gone slap that Bitch" several times.  Security was called to the unit.  Writer continued to verbally de-escalate the situation by attempting to re-direct the patient.  Patient was advised that we can only start from this shift but we are willing to offer help now.  Patient was offered his medication; refused.  Patient was also informed that we cannot allow him to be a danger to himself or anyone else including staff.  Writer excused herself from patient's room.  Will continue to monitor.

## 2023-09-15 ENCOUNTER — Telehealth (HOSPITAL_COMMUNITY): Payer: Self-pay | Admitting: Pharmacy Technician

## 2023-09-15 ENCOUNTER — Other Ambulatory Visit: Payer: Self-pay

## 2023-09-15 ENCOUNTER — Other Ambulatory Visit: Payer: Self-pay | Admitting: Pharmacist

## 2023-09-15 ENCOUNTER — Other Ambulatory Visit (HOSPITAL_COMMUNITY): Payer: Self-pay

## 2023-09-15 ENCOUNTER — Telehealth: Payer: Self-pay

## 2023-09-15 DIAGNOSIS — B20 Human immunodeficiency virus [HIV] disease: Secondary | ICD-10-CM

## 2023-09-15 DIAGNOSIS — F19959 Other psychoactive substance use, unspecified with psychoactive substance-induced psychotic disorder, unspecified: Secondary | ICD-10-CM | POA: Diagnosis not present

## 2023-09-15 MED ORDER — QUETIAPINE FUMARATE 400 MG PO TABS
400.0000 mg | ORAL_TABLET | Freq: Every day | ORAL | 0 refills | Status: DC
  Filled 2023-09-15: qty 30, 30d supply, fill #0

## 2023-09-15 MED ORDER — HYDROXYZINE HCL 25 MG PO TABS
25.0000 mg | ORAL_TABLET | Freq: Three times a day (TID) | ORAL | 0 refills | Status: DC | PRN
  Filled 2023-09-15: qty 30, 10d supply, fill #0

## 2023-09-15 MED ORDER — BICTEGRAVIR-EMTRICITAB-TENOFOV 50-200-25 MG PO TABS
1.0000 | ORAL_TABLET | Freq: Every day | ORAL | 0 refills | Status: DC
  Filled 2023-09-15: qty 30, 30d supply, fill #0

## 2023-09-15 MED ORDER — ATOVAQUONE 750 MG/5ML PO SUSP
1500.0000 mg | Freq: Every day | ORAL | 0 refills | Status: DC
  Filled 2023-09-15: qty 185, 18d supply, fill #0

## 2023-09-15 MED ORDER — NICOTINE 14 MG/24HR TD PT24
14.0000 mg | MEDICATED_PATCH | Freq: Every day | TRANSDERMAL | 0 refills | Status: AC
  Filled 2023-09-15: qty 28, 28d supply, fill #0

## 2023-09-15 MED ORDER — VORTIOXETINE HBR 20 MG PO TABS
20.0000 mg | ORAL_TABLET | Freq: Every day | ORAL | 0 refills | Status: DC
Start: 2023-09-16 — End: 2023-09-19
  Filled 2023-09-15: qty 30, 30d supply, fill #0

## 2023-09-15 MED ORDER — VENLAFAXINE HCL ER 75 MG PO CP24
75.0000 mg | ORAL_CAPSULE | Freq: Every day | ORAL | 0 refills | Status: DC
Start: 2023-09-15 — End: 2023-10-16
  Filled 2023-09-15: qty 30, 30d supply, fill #0

## 2023-09-15 MED ORDER — METHOCARBAMOL 500 MG PO TABS
500.0000 mg | ORAL_TABLET | Freq: Three times a day (TID) | ORAL | 0 refills | Status: AC | PRN
  Filled 2023-09-15: qty 90, 30d supply, fill #0

## 2023-09-15 MED ORDER — VENLAFAXINE HCL ER 37.5 MG PO CP24
37.5000 mg | ORAL_CAPSULE | Freq: Every day | ORAL | Status: AC
  Administered 2023-09-15: 37.5 mg via ORAL
  Filled 2023-09-15: qty 1

## 2023-09-15 MED ORDER — VENLAFAXINE HCL ER 75 MG PO CP24
75.0000 mg | ORAL_CAPSULE | Freq: Every day | ORAL | Status: DC
  Administered 2023-09-16 – 2023-09-17 (×2): 75 mg via ORAL
  Filled 2023-09-15 (×3): qty 1

## 2023-09-15 NOTE — Progress Notes (Signed)
 Specialty Pharmacy Initial Fill Coordination Note  Lee Bridges is a 33 y.o. male contacted today regarding initial fill of specialty medication(s) Bictegravir-Emtricitab-Tenofov [name])   Patient requested [name] at [facility] at [place] date: 09/16/23   Medication will be filled on 09/16/23.   Patient is aware of $0 copayment.

## 2023-09-15 NOTE — Progress Notes (Addendum)
 Pt denies SI/HI/AVH this morning. Pt presents with an irritable affect this morning. Pt approached writer this morning stating "I need to get the hell out of this place. I Conrado't wanna go until they get me into a rehab that's what I need, that's what I came here for". Pt continues to be attention-seeking, Psychologist, prison and probation services later in the morning reporting that he is having excruciating pain in his ankles and can barely walk. PRN Tylenol administered per [facility], pt returned to bedroom following medication administration with no s/s of distress.    09/15/23 0906  Psych Admission Type (Psych Patients Only)  Admission Status Voluntary  Psychosocial Assessment  Patient Complaints Anxiety;Depression;Irritability;Substance abuse  Eye Contact Brief  Facial Expression Flat  Affect Irritable  Speech Loud;Argumentative  Interaction Hostile  Motor Activity Fidgety  Appearance/Hygiene Unremarkable  Behavior Characteristics Agressive verbally;Agitated  Mood Labile;Irritable  Thought Process  Coherency WDL  Content Blaming others  Delusions None reported or observed  Perception WDL  Hallucination None reported or observed  Judgment Impaired  Confusion None  Danger to Self  Current suicidal ideation? Denies  Description of Suicide Plan n/a  Self-Injurious Behavior No self-injurious ideation or behavior indicators observed or expressed   Agreement Not to Harm Self Yes  Description of Agreement Pt verbally contracts for safety  Danger to Others  Danger to Others Reported or observed  Danger to Others Abnormal  Harmful Behavior to others Threats of violence towards other people observed or expressed

## 2023-09-15 NOTE — Progress Notes (Signed)
 Specialty Pharmacy Initiation Note   Lee Bridges is a 33 y.o. male who will be followed by the specialty pharmacy service for RxSp HIV    Review of administration, indication, effectiveness, safety, potential side effects, storage/disposable, and missed dose instructions occurred today for patient's specialty medication(s) Bictegravir-Emtricitab-Tenofov [facility])     Patient/Caregiver did not have any additional questions or concerns.   Patient's therapy is appropriate to: Continue    Goals Addressed             This Visit's Progress    Achieve Undetectable HIV Viral Load < 20       Patient is not on track and improving. Patient will work on increased adherence      Maintain optimal adherence to therapy       Patient is not on track and improving. Patient will work on increased adherence         Sonya Duster Specialty Pharmacist

## 2023-09-15 NOTE — Progress Notes (Signed)
 Patient ID: Lee Bridges, male   DOB: 11/29/1990, 33 y.o.   MRN: [id number] Spoke with Pt on the unit. Mood much more pleasant than days prior. He was made aware that CSW reached out to [facility] for update regarding referral and at this time it remains under review.    CSW team will continue to follow.   Hermenia Fritcher N Maxden Naji, LCSW 09/15/23 8:16 PM

## 2023-09-15 NOTE — Progress Notes (Signed)
In error/duplicate

## 2023-09-15 NOTE — Telephone Encounter (Signed)
 Can we print snapshot for [facility] bridge referral?   I placed an order for the community referral already to help with coordinating his needs.   TY

## 2023-09-15 NOTE — Telephone Encounter (Signed)
 Snapshot placed in [facility] folder.

## 2023-09-15 NOTE — Group Note (Signed)
 Date:  09/15/2023 Time:  9:38 AM  Group Topic/Focus:  Goals Group:   The focus of this group is to help patients establish daily goals to achieve during treatment and discuss how the patient can incorporate goal setting into their daily lives to aide in recovery.    Participation Level:  Did Not Attend  Participation Quality:  Did Not Attend  Affect:  Did Not Attend  Cognitive:  Did Not Attend  Insight: None  Engagement in Group:  Did Not Attend  Modes of Intervention:  Did Not Attend  Additional Comments:  Did Not Attend  Hughie Mae 09/15/2023, 9:38 AM

## 2023-09-15 NOTE — Group Note (Signed)
 LCSW Group Therapy Note   Group Date: 09/15/2023 Start Time: 1100 End Time: 1200  Participation:  did not attend  Type of Therapy:  Group Therapy  Title: Lifestyle:  from "One Day" to "Today is Day One"  Objective:  To promote mental and physical well-being through lifestyle changes in routine, nutrition, sleep, and movement.  Goals: Increase awareness of how lifestyle habits impact mental health. Encourage one small, achievable wellness goal. Support group sharing and accountability.  Summary:  Group members explored how daily habits influence mental health and discussed the importance of starting with small, manageable changes. Participants identified personal goals and shared reflections on improving structure, sleep, diet, and physical activity.  Therapeutic Modalities: CBT - Identifying and challenging all-or-nothing thinking; promoting realistic, helpful thoughts about change. Psychoeducation - Teaching about the impact of sleep, nutrition, movement, and routine on mental health. Motivational Interviewing - Eliciting personal motivation and exploring readiness for change. Goal-Setting - Supporting SMART goals to build self-efficacy and encourage follow-through.   Ioan Landini O Brittaney Beaulieu, LCSWA 09/15/2023  12:13 PM

## 2023-09-15 NOTE — Telephone Encounter (Signed)
 Pharmacy Patient Advocate Encounter   Received notification from Inpatient Request that prior authorization for Trintellix (formerly Brintellix) 20MG  tablets is required/requested.   Insurance verification completed.   The patient is insured through [facility] .   Per test claim: PA required; PA submitted to above mentioned insurance via CoverMyMeds Key/confirmation #/EOC [id number] Status is pending

## 2023-09-15 NOTE — Telephone Encounter (Signed)
 Spoke with Child psychotherapist from [facility] to coordinate care and schedule f/u with Gibson Kurtz due to previous missed appt's. Pt was hospitalized, but does have a f/u set for Stephanie's next available for 10/05/2023 at 1pm.  She did ask briefly if we do offer case management services with our office, and I did confirm that we do have [facility]P and [facility] for patient's.   I do see that he has previously worked with [facility], but she asked if we could assist to have him see or coordinate to have them work with him again once he arrives.

## 2023-09-15 NOTE — Group Note (Signed)
 Date:  09/15/2023 Time:  3:20 PM  Group Topic/Focus:  Emotional Education:   The focus of this group is to discuss what feelings/emotions are, and how they are experienced.    Participation Level:  Active  Participation Quality:  Attentive  Affect:  Appropriate  Cognitive:  Alert  Insight: Good  Engagement in Group:  Engaged  Modes of Intervention:  Education  Additional Comments:  Lee Bridges express his emotions and learned nre technics to manage his emotions.   Lee Bridges 09/15/2023, 3:20 PM

## 2023-09-15 NOTE — Plan of Care (Signed)
   Problem: Education: Goal: Emotional status will improve Outcome: Progressing Goal: Mental status will improve Outcome: Progressing

## 2023-09-15 NOTE — Addendum Note (Signed)
 Addended by: Orson Blalock on: 09/15/2023 02:27 PM   Modules accepted: Orders

## 2023-09-15 NOTE — Progress Notes (Addendum)
 [facility] MD Progress Note  09/15/2023 10:40 AM Lee Bridges  MRN:  [id number]  Per MAR review patient is compliant with medications on the unit PRN Robaxin being used average once to twice daily, as needed Atarax used average once daily for anxiety.  Chart review and staff report indicates that yesterday patient became irritable insulting staff verbally, yelling profanity, threatening to slap MHT, he was then redirectable and went to his room, this happened after staff was appropriately trying to redirect him from a standing in the wrong hallway, notes from staff RN indicates that patient has been denying when asked any SI HI or AVH  Subjective:   Upon assessment today patient is lying down in bed, when encouraged him to answer questions and participate he tells me that he is upset because he feels that nobody from the staff cares about him and he has been sitting in his room all morning "acting asleep but nobody came to tell me to take my medications" patient was encouraged to get up and go to medication window and asked for his medication as he is supposed to participate in his care given he does not have any obstacles to do that.  He presents anxious and upset as noted but does not display any self injures or physically aggressive behavior, he denies active SI intention or plan, denies HI or AVH, does not appear responding to stimuli.  Patient presents dramatic with attention seeking behavior, he continues to report distress given he does not have a place to go to and he claims that staff have not referred him to any rehab places, patient was redirected that staff have attempted several times to refer him to multiple rehab treatment but he was declined.  Patient reportedly slept fairly last night and was reported to have fair appetite. Patient was observed by this provider as well as by other staff to be interacting very appropriately and pleasantly with peers, occasionally smiling and laughing when  interacting in the milieu with no sign of severe depression or ongoing psychosis.  Addendum: Given patient never tried any of the following medications Wellbutrin Pristiq Effexor Viibryd Cymbalta or Remeron he would not qualify to get Trintellix approved by his insurance.  Patient has side effect to trazodone in the past.  After discussion with the patient I will switch to Trintellix to trial of Effexor will start with Effexor XR 37.5 mg daily to titrate after 1 dose to 75 mg daily, will follow.  Principal Problem: Substance-induced psychotic disorder (HCC) Diagnosis: Principal Problem:   Substance-induced psychotic disorder (HCC) Active Problems:   MDD (major depressive disorder), recurrent episode, moderate (HCC)  Total Time spent with patient: 30 minutes  Past Psychiatric History: See H&P  Past Medical History:  Past Medical History:  Diagnosis Date   Depression    HIV (human immunodeficiency virus infection) (HCC)    Psychosis (HCC)    Substance abuse (HCC)    History reviewed. No pertinent surgical history. Family History:  Family History  Problem Relation Age of Onset   Arthritis Mother    Diabetes Father    Hypertension Father    Family Psychiatric  History: denies Social History:  Social History   Substance and Sexual Activity  Alcohol Use Not Currently   Comment: socially     Social History   Substance and Sexual Activity  Drug Use Yes   Types: Cocaine, Marijuana, Methamphetamines   Comment: weekly    Social History   Socioeconomic History   Marital  status: Single    Spouse name: Not on file   Number of children: 0   Years of education: Not on file   Highest education level: Associate degree: academic program  Occupational History   Not on file  Tobacco Use   Smoking status: Every Day    Current packs/day: 0.50    Types: Cigarettes   Smokeless tobacco: Never   Tobacco comments:    1 PPD  Substance and Sexual Activity   Alcohol use: Not Currently     Comment: socially   Drug use: Yes    Types: Cocaine, Marijuana, Methamphetamines    Comment: weekly   Sexual activity: Not Currently  Other Topics Concern   Not on file  Social History Narrative   Homeless x 2 yrs.    Social Drivers of Corporate investment banker Strain: Not on file  Food Insecurity: Food Insecurity Present (09/06/2023)   Hunger Vital Sign    Worried About Running Out of Food in the Last Year: Often true    Ran Out of Food in the Last Year: Often true  Transportation Needs: Unmet Transportation Needs (09/06/2023)   PRAPARE - Administrator, Civil Service (Medical): Yes    Lack of Transportation (Non-Medical): Yes  Physical Activity: Not on file  Stress: Not on file  Social Connections: Socially Isolated (08/09/2023)   Social Connection and Isolation Panel [NHANES]    Frequency of Communication with Friends and Family: Never    Frequency of Social Gatherings with Friends and Family: Never    Attends Religious Services: Never    Database administrator or Organizations: No    Attends Engineer, structural: Never    Marital Status: Never married   Additional Social History:                         Sleep: Fair  Appetite:  Fair  Current Medications: Current Facility-Administered Medications  Medication Dose Route Frequency Provider Last Rate Last Admin   acetaminophen (TYLENOL) tablet 650 mg  650 mg Oral Q6H PRN Roise Cleaver, NP   650 mg at 09/15/23 0912   alum & mag hydroxide-simeth (MAALOX/MYLANTA) 200-200-20 MG/5ML suspension 30 mL  30 mL Oral Q4H PRN Roise Cleaver, NP   30 mL at 09/09/23 1114   atovaquone (MEPRON) 750 MG/5ML suspension 1,500 mg  1,500 mg Oral Q breakfast Zouev, Dmitri, MD   1,500 mg at 09/14/23 0756   bictegravir-emtricitabine-tenofovir AF (BIKTARVY) 50-200-25 MG per tablet 1 tablet  1 tablet Oral Daily Orson Blalock, NP   1 tablet at 09/15/23 0901   haloperidol (HALDOL) tablet 5 mg  5 mg Oral TID  PRN Roise Cleaver, NP   5 mg at 09/06/23 2015   And   diphenhydrAMINE (BENADRYL) capsule 50 mg  50 mg Oral TID PRN Roise Cleaver, NP   50 mg at 09/10/23 2151   haloperidol lactate (HALDOL) injection 5 mg  5 mg Intramuscular TID PRN Roise Cleaver, NP       And   diphenhydrAMINE (BENADRYL) injection 50 mg  50 mg Intramuscular TID PRN Roise Cleaver, NP       And   LORazepam (ATIVAN) injection 2 mg  2 mg Intramuscular TID PRN Roise Cleaver, NP       haloperidol lactate (HALDOL) injection 10 mg  10 mg Intramuscular TID PRN Roise Cleaver, NP       And   diphenhydrAMINE (BENADRYL) injection 50 mg  50 mg Intramuscular TID PRN Eligha Bridegroom, NP       And   LORazepam (ATIVAN) injection 2 mg  2 mg Intramuscular TID PRN Eligha Bridegroom, NP       diphenhydrAMINE-zinc acetate (BENADRYL) 2-0.1 % cream   Topical BID PRN Ajibola, Ene A, NP       hydrOXYzine (ATARAX) tablet 25 mg  25 mg Oral TID PRN Eligha Bridegroom, NP   25 mg at 09/14/23 2208   magnesium hydroxide (MILK OF MAGNESIA) suspension 30 mL  30 mL Oral Daily PRN Eligha Bridegroom, NP       methocarbamol (ROBAXIN) tablet 500 mg  500 mg Oral Q6H PRN Zouev, Dmitri, MD   500 mg at 09/13/23 1837   nicotine (NICODERM CQ - dosed in mg/24 hours) patch 14 mg  14 mg Transdermal Daily Miguel Rota, MD   14 mg at 09/13/23 1610   nystatin cream (MYCOSTATIN)   Topical BID Miguel Rota, MD   1 Application at 09/14/23 1711   QUEtiapine (SEROQUEL XR) 24 hr tablet 400 mg  400 mg Oral QHS Zouev, Dmitri, MD   400 mg at 09/14/23 2208   vortioxetine HBr (TRINTELLIX) tablet 20 mg  20 mg Oral Daily Ancil Dewan, MD   20 mg at 09/15/23 0901    Lab Results:  No results found for this or any previous visit (from the past 48 hours).    Blood Alcohol level:  Lab Results  Component Value Date   ETH <10 09/05/2023   ETH <10 08/08/2023    Metabolic Disorder Labs: Lab Results  Component Value Date   HGBA1C 4.6 (L) 08/12/2023   MPG 85.32  08/12/2023   No results found for: "PROLACTIN" Lab Results  Component Value Date   CHOL 167 08/12/2023   TRIG 239 (H) 08/12/2023   HDL 30 (L) 08/12/2023   CHOLHDL 5.6 08/12/2023   VLDL 48 (H) 08/12/2023   LDLCALC 89 08/12/2023     Musculoskeletal: Strength & Muscle Tone: within normal limits Gait & Station: normal Patient leans: N/A  Psychiatric Specialty Exam:  Presentation  General Appearance:  Appropriate for Environment  Eye Contact: Fair  Speech: Clear and Coherent  Speech Volume: Normal  Handedness: Right   Mood and Affect  Mood: Upset and irritable  Affect: Congruent   Thought Process  Thought Processes: Coherent  Descriptions of Associations:Intact  Orientation:Full (Time, Place and Person)  Thought Content:Logical  History of Schizophrenia/Schizoaffective disorder:No  Duration of Psychotic Symptoms:Greater than six months  Hallucinations:patient reports AH of multiple voices noncommand in nature but has not been observed to be RTIS  Ideas of Reference:None  Suicidal Thoughts:denies SI  Homicidal Thoughts:denies HI   Sensorium  Memory: Immediate Fair  Judgment: Fair  Insight: Fair   Art therapist  Concentration: Fair  Attention Span: Fair  Recall: [employer] of Knowledge: Fair  Language: Fair   Psychomotor Activity  Psychomotor Activity: No data recorded   Assets  Assets: Communication Skills   Sleep  Sleep: fair    Physical Exam: Physical exam: Please see exam on admit note. General: Well developed, well nourished.  Pupils: Normal at 3mm Respiratory: Breathing is unlabored.  Cardiovascular: No edema.  Language: No anomia, no aphasia Muscle strength and tone-pt moving all extremities.  Gait not assessed as pt remained in bed.  Neuro: Facial muscles are symmetric. Pt without tremor, no evidence of hyperarousal.  Review of Systems  Constitutional: Negative.   HENT: Negative.     Eyes: Negative.  Respiratory: Negative.    Cardiovascular: Negative.   Gastrointestinal: Negative.   Genitourinary: Negative.   Musculoskeletal: Negative.   Skin: Negative.   Neurological: Negative.   Endo/Heme/Allergies: Negative.   Psychiatric/Behavioral: Negative.    All other systems reviewed and are negative.  Blood pressure 134/77, pulse (!) 112, temperature 98.4 F (36.9 C), temperature source Oral, resp. rate 16, height 5\' 2"  (1.575 m), weight 57.2 kg, SpO2 98%. Body mass index is 23.05 kg/m.   Treatment Plan Summary:   Daily contact with patient to assess and evaluate symptoms and progress in treatment, Medication management, and Plan:  - Continue Seroquel XR 400  mg at bedtime which patient has historically had a positive response to per documentation and has been less agitated with increase with less mood lability, no longer threatening to kill himself or kill his roommate and reports benefit from medication  Addendum: Given patient never tried any of the following medications Wellbutrin Pristiq Effexor Viibryd Cymbalta or Remeron he would not qualify to get Trintellix approved by his insurance.  Patient has side effect to trazodone in the past.  After discussion with the patient I will switch to Trintellix to trial of Effexor will start with Effexor XR 37.5 mg daily to titrate after 1 dose to 75 mg daily, will follow.  Will have patient scheduled for follow-up with infectious disease after discharge as well as primary care provider.  Will also have patient referred for outpatient treatment including medication management and counseling.  Noted to patient that he has access to transportation services through [facility] to help him comply with outpatient follow-up scheduled.  Observation Level/Precautions:  15 minute checks  Laboratory:  CBC Chemistry Profile UDS UA Vitamin B-12  Psychotherapy:  will attend group therapy  Medications:  Seroquel and Trintellix   Consultations:  curbside consult with ID (ordered CD4 counts as well as HIV testing)  Discharge Concerns: Will continue to attempt referral to sober living or rehab treatment, will need outpatient follow-up with infectious disease provider for HIV  Estimated LOS:3-5 days  Other:       Medical Consult: curbside consult with outpatient provider Patient had CD4 drawn but not RPR or HIV-RNA so I reordered labs and will followup Vital signs stable  Reached out to [facility] regarding patient's lab results and will followup: CD4:258, RPR (reactive, titer 1:32); The HIV RNA lab looks like is still pending  See noted from 4/10 with following recs: "In review of his chart and records I Khadar't feel strongly he needs an additional dose of the bicillin as his infection appears to be early between July 2024 and October 2024.    I will repeat a RPR, CD4 and HIV RNA level (last one was 196,000 copies in February).    Continue biktarvy once daily, regarding pneumocystis prophylaxis he can continue either Bactrim 1 SS daily or Atovaquone 1500 mg daily. His creatinine is normal but Patsy does not want to "shut down his kidneys" per what he recalls from previous [facility] ID team."  4/12: curbsided ARNP dixon who notes: "the syphilis titer has already shown a > 4 fold reduction following the treatment he has already had so even more comfortable with no further bicillin. That was a bit of an overkill recommendation - which is OK to be over cautious sometimes.  His CD4 is better - I would say he should continue the atovaquone another 3 months then we can stop it IF he stays on his Biktarvy more consistently - which he  needs to continue of course lifelong.  I will take a peak at the viral load Monday "     Physician Treatment Plan for Primary Diagnosis: Substance-induced psychotic disorder [facility]) Long Term Goal(s): Improvement in symptoms so as ready for discharge   Short Term Goals: Ability to identify changes in  lifestyle to reduce recurrence of condition will improve, Ability to verbalize feelings will improve, Ability to disclose and discuss suicidal ideas, Ability to demonstrate self-control will improve, Ability to identify and develop effective coping behaviors will improve, Ability to maintain clinical measurements within normal limits will improve, Compliance with prescribed medications will improve, and Ability to identify triggers associated with substance abuse/mental health issues will improve   Physician Treatment Plan for Secondary Diagnosis: Principal Problem:   Substance-induced psychotic disorder (HCC) Active Problems:   MDD (major depressive disorder), recurrent episode, moderate (HCC)   Long Term Goal(s): Improvement in symptoms so as ready for discharge   Short Term Goals: Ability to identify changes in lifestyle to reduce recurrence of condition will improve, Ability to verbalize feelings will improve, Ability to disclose and discuss suicidal ideas, Ability to demonstrate self-control will improve, Ability to identify and develop effective coping behaviors will improve, Ability to maintain clinical measurements within normal limits will improve, and Compliance with prescribed medications will improve   I certify that inpatient services furnished can reasonably be expected to improve the patient's condition.       Jenissa Tyrell Linnie Riches, MD 09/15/2023, 10:40 AM

## 2023-09-16 ENCOUNTER — Encounter (HOSPITAL_COMMUNITY): Payer: Self-pay

## 2023-09-16 DIAGNOSIS — F19959 Other psychoactive substance use, unspecified with psychoactive substance-induced psychotic disorder, unspecified: Secondary | ICD-10-CM | POA: Diagnosis not present

## 2023-09-16 NOTE — Group Note (Signed)
 Date:  09/16/2023 Time:  4:57 PM  Setbacks in Recovery: The purpose of the group was to utilize concepts from CBT therapy to promote emotional wellness. More specifically, we identified and defined unhelpful thought patterns that could be the first step in changing the way participants think while in recovery from a mental health crisis.    Participation Level:  Did Not Attend   Lee Bridges 09/16/2023, 4:57 PM

## 2023-09-16 NOTE — Group Note (Signed)
 Date:  09/16/2023 Time:  4:48 PM  Group Topic/Focus:  Goals Group:   The focus of this group is to help patients establish daily goals to achieve during treatment and discuss how the patient can incorporate goal setting into their daily lives to aide in recovery. Orientation:   The focus of this group is to educate the patient on the purpose and policies of crisis stabilization and provide a format to answer questions about their admission.  The group details unit policies and expectations of patients while admitted.    Participation Level:  Did Not Attend   Lee Bridges 09/16/2023, 4:48 PM

## 2023-09-16 NOTE — Group Note (Signed)
 Occupational Therapy Group Note  Group Topic:Coping Skills  Group Date: 09/15/2023 Start Time: 1400 End Time: 1430 Facilitators: Lee Bridges, OT   Group Description: Group encouraged increased engagement and participation through discussion and activity focused on "Coping Ahead." Patients were split up into teams and selected a card from a stack of positive coping strategies. Patients were instructed to act out/charade the coping skill for other peers to guess and receive points for their team. Discussion followed with a focus on identifying additional positive coping strategies and patients shared how they were going to cope ahead over the weekend while continuing hospitalization stay.  Therapeutic Goal(s): Identify positive vs negative coping strategies. Identify coping skills to be used during hospitalization vs coping skills outside of hospital/at home Increase participation in therapeutic group environment and promote engagement in treatment   Participation Level: Did not attend                              Plan: Continue to engage patient in OT groups 2 - 3x/week.  09/16/2023  Lee Bridges, OT  Lee Bridges, OT

## 2023-09-16 NOTE — Progress Notes (Signed)
   09/16/23 1050  Psych Admission Type (Psych Patients Only)  Admission Status Voluntary  Psychosocial Assessment  Patient Complaints Anger;Anxiety  Eye Contact Fair  Facial Expression Angry  Affect Angry  Speech Rapid;Pressured  Interaction Assertive  Motor Activity Restless  Appearance/Hygiene Improved  Behavior Characteristics Agitated  Mood Angry  Thought Process  Coherency Flight of ideas  Content Blaming others  Delusions None reported or observed  Perception WDL  Hallucination None reported or observed  Judgment Impaired  Confusion None  Danger to Self  Current suicidal ideation? Denies  Self-Injurious Behavior No self-injurious ideation or behavior indicators observed or expressed   Agreement Not to Harm Self Yes  Description of Agreement Verbal  Danger to Others  Danger to Others None reported or observed

## 2023-09-16 NOTE — Group Note (Signed)
 Date:  09/16/2023 Time:  8:47 PM  Group Topic/Focus:  Wrap-Up Group:   The focus of this group is to help patients review their daily goal of treatment and discuss progress on daily workbooks.    Participation Level:  Active  Participation Quality:  Appropriate and Sharing  Affect:  Appropriate  Cognitive:  Appropriate  Insight: Appropriate  Engagement in Group:  Engaged  Modes of Intervention:  Activity and Socialization  Additional Comments:  Patient attended wrap up group.  Dillard Frame 09/16/2023, 8:47 PM

## 2023-09-16 NOTE — BH IP Treatment Plan (Signed)
 Interdisciplinary Treatment and Diagnostic Plan Update  09/16/2023 Time of Session: 11:50 AM - UPDATE Lee Bridges MRN: [id number]  Principal Diagnosis: Substance-induced psychotic disorder (HCC)  Secondary Diagnoses: Principal Problem:   Substance-induced psychotic disorder (HCC) Active Problems:   MDD (major depressive disorder), recurrent episode, moderate (HCC)   Current Medications:  Current Facility-Administered Medications  Medication Dose Route Frequency Provider Last Rate Last Admin   acetaminophen  (TYLENOL ) tablet 650 mg  650 mg Oral Q6H PRN Roise Cleaver, NP   650 mg at 09/15/23 0912   alum & mag hydroxide-simeth (MAALOX/MYLANTA) 200-200-20 MG/5ML suspension 30 mL  30 mL Oral Q4H PRN Roise Cleaver, NP   30 mL at 09/09/23 1114   atovaquone  (MEPRON ) 750 MG/5ML suspension 1,500 mg  1,500 mg Oral Q breakfast Zouev, Dmitri, MD   1,500 mg at 09/16/23 0946   bictegravir-emtricitabine -tenofovir  AF (BIKTARVY ) 50-200-25 MG per tablet 1 tablet  1 tablet Oral Daily Orson Blalock, NP   1 tablet at 09/16/23 0949   haloperidol  (HALDOL ) tablet 5 mg  5 mg Oral TID PRN Roise Cleaver, NP   5 mg at 09/06/23 2015   And   diphenhydrAMINE  (BENADRYL ) capsule 50 mg  50 mg Oral TID PRN Roise Cleaver, NP   50 mg at 09/10/23 2151   haloperidol  lactate (HALDOL ) injection 5 mg  5 mg Intramuscular TID PRN Roise Cleaver, NP       And   diphenhydrAMINE  (BENADRYL ) injection 50 mg  50 mg Intramuscular TID PRN Roise Cleaver, NP       And   LORazepam  (ATIVAN ) injection 2 mg  2 mg Intramuscular TID PRN Roise Cleaver, NP       haloperidol  lactate (HALDOL ) injection 10 mg  10 mg Intramuscular TID PRN Roise Cleaver, NP       And   diphenhydrAMINE  (BENADRYL ) injection 50 mg  50 mg Intramuscular TID PRN Roise Cleaver, NP       And   LORazepam  (ATIVAN ) injection 2 mg  2 mg Intramuscular TID PRN Roise Cleaver, NP       diphenhydrAMINE -zinc  acetate (BENADRYL ) 2-0.1 % cream    Topical BID PRN Ajibola, Ene A, NP       hydrOXYzine  (ATARAX ) tablet 25 mg  25 mg Oral TID PRN Roise Cleaver, NP   25 mg at 09/15/23 2102   magnesium  hydroxide (MILK OF MAGNESIA) suspension 30 mL  30 mL Oral Daily PRN Roise Cleaver, NP       methocarbamol  (ROBAXIN ) tablet 500 mg  500 mg Oral Q6H PRN Zouev, Dmitri, MD   500 mg at 09/13/23 1837   nicotine  (NICODERM CQ  - dosed in mg/24 hours) patch 14 mg  14 mg Transdermal Daily Zouev, Dmitri, MD   14 mg at 09/13/23 0734   nystatin  cream (MYCOSTATIN )   Topical BID Zouev, Dmitri, MD   Given at 09/16/23 1624   QUEtiapine  (SEROQUEL  XR) 24 hr tablet 400 mg  400 mg Oral QHS Zouev, Dmitri, MD   400 mg at 09/15/23 2146   venlafaxine  XR (EFFEXOR -XR) 24 hr capsule 75 mg  75 mg Oral Daily Linnie Riches, Nadir, MD   75 mg at 09/16/23 0947   PTA Medications: Medications Prior to Admission  Medication Sig Dispense Refill Last Dose/Taking   atovaquone  (MEPRON ) 750 MG/5ML suspension Take 10 mLs (1,500 mg total) by mouth daily with breakfast. 300 mL 0    QUEtiapine  (SEROQUEL ) 100 MG tablet Take 1 tablet (100 mg total) by mouth at bedtime. 30 tablet 0  sertraline  (ZOLOFT ) 50 MG tablet Take 1 tablet (50 mg total) by mouth daily. 30 tablet 0    [DISCONTINUED] methocarbamol  (ROBAXIN ) 500 MG tablet Take 1 tablet (500 mg total) by mouth every 6 (six) hours as needed for muscle spasms. 20 tablet 0     Patient Stressors:    Patient Strengths:    Treatment Modalities: Medication Management, Group therapy, Case management,  1 to 1 session with clinician, Psychoeducation, Recreational therapy.   Physician Treatment Plan for Primary Diagnosis: Substance-induced psychotic disorder (HCC) Long Term Goal(s): Improvement in symptoms so as ready for discharge   Short Term Goals: Ability to identify changes in lifestyle to reduce recurrence of condition will improve Ability to verbalize feelings will improve Ability to disclose and discuss suicidal ideas Ability to  demonstrate self-control will improve Ability to identify and develop effective coping behaviors will improve Ability to maintain clinical measurements within normal limits will improve Compliance with prescribed medications will improve Ability to identify triggers associated with substance abuse/mental health issues will improve  Medication Management: Evaluate patient's response, side effects, and tolerance of medication regimen.  Therapeutic Interventions: 1 to 1 sessions, Unit Group sessions and Medication administration.  Evaluation of Outcomes: Progressing  Physician Treatment Plan for Secondary Diagnosis: Principal Problem:   Substance-induced psychotic disorder (HCC) Active Problems:   MDD (major depressive disorder), recurrent episode, moderate (HCC)  Long Term Goal(s): Improvement in symptoms so as ready for discharge   Short Term Goals: Ability to identify changes in lifestyle to reduce recurrence of condition will improve Ability to verbalize feelings will improve Ability to disclose and discuss suicidal ideas Ability to demonstrate self-control will improve Ability to identify and develop effective coping behaviors will improve Ability to maintain clinical measurements within normal limits will improve Compliance with prescribed medications will improve Ability to identify triggers associated with substance abuse/mental health issues will improve     Medication Management: Evaluate patient's response, side effects, and tolerance of medication regimen.  Therapeutic Interventions: 1 to 1 sessions, Unit Group sessions and Medication administration.  Evaluation of Outcomes: Progressing   RN Treatment Plan for Primary Diagnosis: Substance-induced psychotic disorder (HCC) Long Term Goal(s): Knowledge of disease and therapeutic regimen to maintain health will improve  Short Term Goals: Ability to remain free from injury will improve, Ability to verbalize frustration and  anger appropriately will improve, Ability to participate in decision making will improve, Ability to verbalize feelings will improve, Ability to identify and develop effective coping behaviors will improve, and Compliance with prescribed medications will improve  Medication Management: RN will administer medications as ordered by provider, will assess and evaluate patient's response and provide education to patient for prescribed medication. RN will report any adverse and/or side effects to prescribing provider.  Therapeutic Interventions: 1 on 1 counseling sessions, Psychoeducation, Medication administration, Evaluate responses to treatment, Monitor vital signs and CBGs as ordered, Perform/monitor CIWA, COWS, AIMS and Fall Risk screenings as ordered, Perform wound care treatments as ordered.  Evaluation of Outcomes: Progressing   LCSW Treatment Plan for Primary Diagnosis: Substance-induced psychotic disorder [facility]) Long Term Goal(s): Safe transition to appropriate next level of care at discharge, Engage patient in therapeutic group addressing interpersonal concerns.  Short Term Goals: Engage patient in aftercare planning with referrals and resources, Increase ability to appropriately verbalize feelings, Facilitate acceptance of mental health diagnosis and concerns, and Identify triggers associated with mental health/substance abuse issues  Therapeutic Interventions: Assess for all discharge needs, 1 to 1 time with Social worker, Explore available  resources and support systems, Assess for adequacy in community support network, Educate family and significant other(s) on suicide prevention, Complete Psychosocial Assessment, Interpersonal group therapy.  Evaluation of Outcomes: Progressing   PProgress in Treatment: Attending groups;  Attended some groups Participating in groups: Yes Taking medication as prescribed: Yes. Toleration medication: Yes. Family/Significant other contact made: Pt  declined consents Patient understands diagnosis: Yes. Discussing patient identified problems/goals with staff: Yes. Medical problems stabilized or resolved: Yes. Denies suicidal/homicidal ideation: No Issues/concerns per patient self-inventory: No.   New problem(s) identified: No, Describe:  none   New Short Term/Long Term Goal(s): medication stabilization, elimination of SI thoughts, development of comprehensive mental wellness plan.      Patient Goals:  "Lower my depression"   Discharge Plan or Barriers: Patient recently admitted. CSW will continue to follow and assess for appropriate referrals and possible discharge planning.      Reason for Continuation of Hospitalization: Depression Hallucinations Medication stabilization Suicidal ideation   Estimated Length of Stay: 1 - 2 days  Last 3 [place] Suicide Severity Risk Score: Flowsheet Row [facility] 09[facility]05/2023 in [facility] Admission (Discharged) from 08/11/2023 [facility] ([facility])  C-SSRS RISK CATEGORY High Risk High Risk Error: Q7 should not be populated when Q6 is No       Last PHQ 2/9 Scores:    03/29/2023    8:45 AM 12/28/2022   10:49 AM 12/23/2022   10:38 AM  Depression screen PHQ 2/9  Decreased Interest 2 2 0  Down, Depressed, Hopeless 0 3 0  PHQ - 2 Score 2 5 0  Altered sleeping 3 2   Tired, decreased energy 0 3   Change in appetite 0 3   Feeling bad or failure about yourself  0 3   Trouble concentrating 0 3   Moving slowly or fidgety/restless 0 3   Suicidal thoughts 2 2   PHQ-9 Score 7 24   Difficult doing work/chores Somewhat difficult Somewhat difficult     Scribe for Treatment Team: Jinnifer Montejano O Rhyatt Muska, LCSWA 09/16/2023 4:25 PM

## 2023-09-16 NOTE — Group Note (Signed)
 Recreation Therapy Group Note   Group Topic:Leisure Education  Group Date: 09/16/2023 Start Time: 0930 End Time: 1030 Facilitators: Chasta Deshpande-McCall, LRT,CTRS Location: [address] Dayroom   Group Topic: Leisure Education   Goal Area(s) Addresses:  Patient will successfully identify positive leisure and recreation activities.  Patient will acknowledge benefits of participation in healthy leisure activities post discharge.  Patient will actively work with peers toward a shared goal.   Intervention: Competitive Group Game   Activity: Guess The Lyric. The game consisted of 6 categories ([address] [place], Indie, [employer], Hip Hop and Dance). In groups of 3-4, patients took turns flicking the spinner. Which every category, the team landed on, they had to finish the lyric to a song from that category. If they guessed the lyric correctly, the team keeps the card. There were also spaces on the spinner where a group could randomly pick a category of their choosing or steal a card from another team. The team with the most cards, wins the game.   Education:  Teacher, English as a foreign language, Stress Management, Discharge Planning  Education Outcome: Acknowledges education/In group clarification offered/Needs additional education   Affect/Mood: Appropriate   Participation Level: Engaged   Participation Quality: Independent   Behavior: Appropriate   Speech/Thought Process: Focused   Insight: Good   Judgement: Good   Modes of Intervention: Competitive Play   Patient Response to Interventions:  Engaged   Education Outcome:  In group clarification offered    Clinical Observations/Individualized Feedback: Pt was bright and engaged in group session. Pt was able to make some correct guesses with some of the lyrics he had. Pt was called out but later returned.     Plan: Continue to engage patient in RT group sessions 2-3x/week.   Manar Smalling-McCall, LRT,CTRS  09/16/2023 12:41 PM

## 2023-09-16 NOTE — Plan of Care (Signed)
   Problem: Education: Goal: Emotional status will improve Outcome: Progressing Goal: Mental status will improve Outcome: Progressing Goal: Verbalization of understanding the information provided will improve Outcome: Progressing   Problem: Activity: Goal: Interest or engagement in activities will improve Outcome: Progressing

## 2023-09-16 NOTE — Progress Notes (Signed)
   09/15/23 2128  Psych Admission Type (Psych Patients Only)  Admission Status Voluntary  Psychosocial Assessment  Patient Complaints Anxiety;Depression  Eye Contact Fair  Facial Expression Anxious  Affect Anxious;Appropriate to circumstance  Speech Logical/coherent  Interaction Assertive;Needy  Motor Activity Fidgety  Appearance/Hygiene Unremarkable  Behavior Characteristics Cooperative  Mood Anxious  Thought Process  Coherency WDL  Content WDL  Delusions None reported or observed  Perception WDL  Hallucination None reported or observed  Judgment Impaired  Confusion None  Danger to Self  Current suicidal ideation? Denies  Agreement Not to Harm Self Yes  Description of Agreement verbal  Danger to Others  Danger to Others None reported or observed

## 2023-09-16 NOTE — Telephone Encounter (Signed)
 Pharmacy Patient Advocate Encounter  Received notification from [facility] that Prior Authorization for Trintellix  (formerly Brintellix) 20MG  tablets  has been DENIED.  Full denial letter will be uploaded to the media tab. See denial reason below.   PA #/Case ID/Reference #: [id number]

## 2023-09-16 NOTE — Progress Notes (Signed)
 [facility] MD Progress Note  09/16/2023 10:50 AM Lee Bridges  MRN:  [id number]  Per MAR review patient is compliant with medications on the unit PRN Robaxin  being used average once to twice daily, as needed Atarax  used average once daily for anxiety.  Chart review indicates patient is compliant with meds on the unit, prn atarax  being used daily for anxiety, last prn haldol  for agitation was given on 4/8. Patient cont to have some incidents of irritability with staff related to being dramatic and acting irritable when redirectable, also attention seeking at times.  Subjective:   Upon assessment today patient presents pleasant and interactive denies SI HI or AVH, continues to report wanting to go for rehab place after he leaves here noting that if he leaves without going to rehab being homeless will cause him to decompensate, chart review referral update was sent to [facility] and still awaiting response.  With further discussion patient reports if I am going to go to the shelter I am just going to go ahead and jump off a bridge I have nothing to live for I will kill myself he notes that while being tearful with sad affect.  Trintellix  was switched for Effexor  yesterday and adjusted to 75 mg daily to address depression, switch was completed given Trintellix  was not approved by insurance. Patient denies HI or AVH, denies side effect of medications and agrees to continue to comply.  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  Will hold on discharge plan and await for response from [facility] regarding rehab treatment.   Principal Problem: Substance-induced psychotic disorder (HCC) Diagnosis: Principal Problem:   Substance-induced psychotic disorder (HCC) Active Problems:   MDD  (major depressive disorder), recurrent episode, moderate (HCC)  Total Time spent with patient: 30 minutes  Past Psychiatric History: See H&P  Past Medical History:  Past Medical History:  Diagnosis Date   Depression    HIV (human immunodeficiency virus infection) (HCC)    Psychosis (HCC)    Substance abuse (HCC)    History reviewed. No pertinent surgical history. Family History:  Family History  Problem Relation Age of Onset   Arthritis Mother    Diabetes Father    Hypertension Father    Family Psychiatric  History: denies Social History:  Social History   Substance and Sexual Activity  Alcohol Use Not Currently   Comment: socially     Social History   Substance and Sexual Activity  Drug Use Yes   Types: Cocaine, Marijuana, Methamphetamines   Comment: weekly    Social History   Socioeconomic History   Marital status: Single    Spouse name: Not on file   Number of children: 0   Years of education: Not on file   Highest education level: Associate degree: academic program  Occupational History   Not on file  Tobacco Use   Smoking status: Every Day    Current packs/day: 0.50    Types: Cigarettes   Smokeless tobacco: Never   Tobacco comments:    1 PPD  Substance and Sexual Activity   Alcohol use: Not Currently    Comment: socially   Drug use: Yes    Types: Cocaine, Marijuana, Methamphetamines    Comment: weekly   Sexual activity: Not Currently  Other Topics Concern  Not on file  Social History Narrative   Homeless x 2 yrs.    Social Drivers of Corporate Investment Banker Strain: Not on file  Food Insecurity: Food Insecurity Present (09/06/2023)   Hunger Vital Sign    Worried About Running Out of Food in the Last Year: Often true    Ran Out of Food in the Last Year: Often true  Transportation Needs: Unmet Transportation Needs (09/06/2023)   PRAPARE - Administrator, Civil Service (Medical): Yes    Lack of Transportation (Non-Medical): Yes   Physical Activity: Not on file  Stress: Not on file  Social Connections: Socially Isolated (08/09/2023)   Social Connection and Isolation Panel [NHANES]    Frequency of Communication with Friends and Family: Never    Frequency of Social Gatherings with Friends and Family: Never    Attends Religious Services: Never    Database Administrator or Organizations: No    Attends Engineer, Structural: Never    Marital Status: Never married   Additional Social History:                         Sleep: Fair  Appetite:  Fair  Current Medications: Current Facility-Administered Medications  Medication Dose Route Frequency Provider Last Rate Last Admin   acetaminophen  (TYLENOL ) tablet 650 mg  650 mg Oral Q6H PRN Mardy Legacy, NP   650 mg at 09/15/23 0912   alum & mag hydroxide-simeth (MAALOX/MYLANTA) 200-200-20 MG/5ML suspension 30 mL  30 mL Oral Q4H PRN Mardy Legacy, NP   30 mL at 09/09/23 1114   atovaquone  (MEPRON ) 750 MG/5ML suspension 1,500 mg  1,500 mg Oral Q breakfast Zouev, Dmitri, MD   1,500 mg at 09/16/23 0946   bictegravir-emtricitabine -tenofovir  AF (BIKTARVY ) 50-200-25 MG per tablet 1 tablet  1 tablet Oral Daily Melvenia [name] SAILOR, NP   1 tablet at 09/16/23 0949   haloperidol  (HALDOL ) tablet 5 mg  5 mg Oral TID PRN Mardy Legacy, NP   5 mg at 09/06/23 2015   And   diphenhydrAMINE  (BENADRYL ) capsule 50 mg  50 mg Oral TID PRN Mardy Legacy, NP   50 mg at 09/10/23 2151   haloperidol  lactate (HALDOL ) injection 5 mg  5 mg Intramuscular TID PRN Mardy Legacy, NP       And   diphenhydrAMINE  (BENADRYL ) injection 50 mg  50 mg Intramuscular TID PRN Mardy Legacy, NP       And   LORazepam  (ATIVAN ) injection 2 mg  2 mg Intramuscular TID PRN Mardy Legacy, NP       haloperidol  lactate (HALDOL ) injection 10 mg  10 mg Intramuscular TID PRN Mardy Legacy, NP       And   diphenhydrAMINE  (BENADRYL ) injection 50 mg  50 mg Intramuscular TID PRN Mardy Legacy,  NP       And   LORazepam  (ATIVAN ) injection 2 mg  2 mg Intramuscular TID PRN Mardy Legacy, NP       diphenhydrAMINE -zinc  acetate (BENADRYL ) 2-0.1 % cream   Topical BID PRN Ajibola, Ene A, NP       hydrOXYzine  (ATARAX ) tablet 25 mg  25 mg Oral TID PRN Mardy Legacy, NP   25 mg at 09/15/23 2102   magnesium  hydroxide (MILK OF MAGNESIA) suspension 30 mL  30 mL Oral Daily PRN Mardy Legacy, NP       methocarbamol  (ROBAXIN ) tablet 500 mg  500 mg Oral Q6H PRN Zouev, Dmitri, MD  500 mg at 09/13/23 1837   nicotine  (NICODERM CQ  - dosed in mg/24 hours) patch 14 mg  14 mg Transdermal Daily Zouev, Dmitri, MD   14 mg at 09/13/23 0734   nystatin  cream (MYCOSTATIN )   Topical BID Zouev, Dmitri, MD   1 Application at 09/14/23 1711   QUEtiapine  (SEROQUEL  XR) 24 hr tablet 400 mg  400 mg Oral QHS Zouev, Dmitri, MD   400 mg at 09/15/23 2146   venlafaxine  XR (EFFEXOR -XR) 24 hr capsule 75 mg  75 mg Oral Daily Jakolby Sedivy, MD   75 mg at 09/16/23 0947    Lab Results:  No results found for this or any previous visit (from the past 48 hours).    Blood Alcohol level:  Lab Results  Component Value Date   ETH <10 09/05/2023   ETH <10 08/08/2023    Metabolic Disorder Labs: Lab Results  Component Value Date   HGBA1C 4.6 (L) 08/12/2023   MPG 85.32 08/12/2023   No results found for: PROLACTIN Lab Results  Component Value Date   CHOL 167 08/12/2023   TRIG 239 (H) 08/12/2023   HDL 30 (L) 08/12/2023   CHOLHDL 5.6 08/12/2023   VLDL 48 (H) 08/12/2023   LDLCALC 89 08/12/2023     Musculoskeletal: Strength & Muscle Tone: within normal limits Gait & Station: normal Patient leans: N/A  Psychiatric Specialty Exam:  Presentation  General Appearance:  Appropriate for Environment  Eye Contact: Fair  Speech: Clear and Coherent  Speech Volume: Normal  Handedness: Right   Mood and Affect  Mood: Upset and irritable  Affect: Congruent   Thought Process  Thought  Processes: Coherent  Descriptions of Associations:Intact  Orientation:Full (Time, Place and Person)  Thought Content:Logical  History of Schizophrenia/Schizoaffective disorder:No  Duration of Psychotic Symptoms:Greater than six months  Hallucinations:patient reports AH of multiple voices noncommand in nature but has not been observed to be RTIS  Ideas of Reference:None  Suicidal Thoughts: Active SI with plan to harm self as noted above, unable to contract for safety outside the hospital  Homicidal Thoughts:denies HI   Sensorium  Memory: Immediate Fair  Judgment: Fair  Insight: Fair   Art Therapist  Concentration: Fair  Attention Span: Fair  Recall: [employer] of Knowledge: Fair  Language: Fair   Psychomotor Activity  Psychomotor Activity: No data recorded   Assets  Assets: Communication Skills   Sleep  Sleep: fair    Physical Exam: Physical exam: Please see exam on admit note. General: Well developed, well nourished.  Pupils: Normal at 3mm Respiratory: Breathing is unlabored.  Cardiovascular: No edema.  Language: No anomia, no aphasia Muscle strength and tone-pt moving all extremities.  Gait not assessed as pt remained in bed.  Neuro: Facial muscles are symmetric. Pt without tremor, no evidence of hyperarousal.  Review of Systems  Constitutional: Negative.   HENT: Negative.    Eyes: Negative.   Respiratory: Negative.    Cardiovascular: Negative.   Gastrointestinal: Negative.   Genitourinary: Negative.   Musculoskeletal: Negative.   Skin: Negative.   Neurological: Negative.   Endo/Heme/Allergies: Negative.   Psychiatric/Behavioral: Negative.    All other systems reviewed and are negative.  Blood pressure (!) 142/85, pulse 100, temperature 97.9 F (36.6 C), temperature source Oral, resp. rate 16, height 5' 2 (1.575 m), weight 57.2 kg, SpO2 100%. Body mass index is 23.05 kg/m.   Treatment Plan Summary:   Daily  contact with patient to assess and evaluate symptoms and progress in treatment, Medication  management, and Plan:  - Continue Seroquel  XR 400  mg at bedtime which patient has historically had a positive response to per documentation and has been less agitated with increase with less mood lability, no longer threatening to kill himself or kill his roommate and reports benefit from medication  Continue Effexor  XR 75 mg daily for depression, consider titrating up if needed and well-tolerated.  Will follow regarding referral to [facility] for inpatient rehab treatment.   Will have patient scheduled for follow-up with infectious disease after discharge as well as primary care provider.  Will also have patient referred for outpatient treatment including medication management and counseling.  Noted to patient that he has access to transportation services through [facility] to help him comply with outpatient follow-up scheduled.  Observation Level/Precautions:  15 minute checks  Laboratory:  CBC Chemistry Profile UDS UA Vitamin B-12  Psychotherapy:  will attend group therapy  Medications:  Seroquel  and Trintellix   Consultations:  curbside consult with ID (ordered CD4 counts as well as HIV testing)  Discharge Concerns: Will continue to attempt referral to sober living or rehab treatment, will need outpatient follow-up with infectious disease provider for HIV  Estimated LOS:3-5 days  Other:       Medical Consult: curbside consult with outpatient provider Patient had CD4 drawn but not RPR or HIV-RNA so I reordered labs and will followup Vital signs stable  Reached out to [facility] regarding patient's lab results and will followup: CD4:258, RPR (reactive, titer 1:32); The HIV RNA lab looks like is still pending  See noted from 4/10 with following recs: In review of his chart and records I Esten't feel strongly he needs an additional dose of the bicillin  as his infection appears to be early between July  2024 and October 2024.    I will repeat a RPR, CD4 and HIV RNA level (last one was 196,000 copies in February).    Continue biktarvy  once daily, regarding pneumocystis prophylaxis he can continue either Bactrim  1 SS daily or Atovaquone  1500 mg daily. His creatinine is normal but Quadir does not want to shut down his kidneys per what he recalls from previous [facility] ID team.  4/12: curbsided ARNP dixon who notes: the syphilis titer has already shown a > 4 fold reduction following the treatment he has already had so even more comfortable with no further bicillin . That was a bit of an overkill recommendation - which is OK to be over cautious sometimes.  His CD4 is better - I would say he should continue the atovaquone  another 3 months then we can stop it IF he stays on his Biktarvy  more consistently - which he needs to continue of course lifelong.  I will take a peak at the viral load Monday      Physician Treatment Plan for Primary Diagnosis: Substance-induced psychotic disorder (HCC) Long Term Goal(s): Improvement in symptoms so as ready for discharge   Short Term Goals: Ability to identify changes in lifestyle to reduce recurrence of condition will improve, Ability to verbalize feelings will improve, Ability to disclose and discuss suicidal ideas, Ability to demonstrate self-control will improve, Ability to identify and develop effective coping behaviors will improve, Ability to maintain clinical measurements within normal limits will improve, Compliance with prescribed medications will improve, and Ability to identify triggers associated with substance abuse/mental health issues will improve   Physician Treatment Plan for Secondary Diagnosis: Principal Problem:   Substance-induced psychotic disorder (HCC) Active Problems:   MDD (major depressive disorder), recurrent episode,  moderate (HCC)   Long Term Goal(s): Improvement in symptoms so as ready for discharge   Short Term Goals: Ability to  identify changes in lifestyle to reduce recurrence of condition will improve, Ability to verbalize feelings will improve, Ability to disclose and discuss suicidal ideas, Ability to demonstrate self-control will improve, Ability to identify and develop effective coping behaviors will improve, Ability to maintain clinical measurements within normal limits will improve, and Compliance with prescribed medications will improve   I certify that inpatient services furnished can reasonably be expected to improve the patient's condition.     Kinslei Labine Evelena, MD 09/16/2023, 10:50 AM

## 2023-09-16 NOTE — Progress Notes (Signed)
 [name] is feeling exhaustion and emotional fatigue around his homelessness as well as the burdens he carries medically.  He wants help and resources and feels like he is not getting what he needs.  He stated that if he were to be discharged without getting resources he would lose his will to live. I provided listening as well as emotional support and am in consultation with the medical and support team to see what resources might be available for him.

## 2023-09-16 NOTE — [facility] Group Notes (Signed)
 [facility] Group Notes:  (Nursing/MHT/Case Management/Adjunct)  Date:  09/16/2023  Time:  8:16 PM  Type of Therapy:   Wrap Up Group  Participation Level:  Active  Participation Quality:  Appropriate  Affect:  Appropriate  Cognitive:  Appropriate  Insight:  Appropriate and Improving  Engagement in Group:  Engaged  Modes of Intervention:  Discussion and Support  Summary of Progress/Problems: Patient participated in group appropriately.  Sukaina Toothaker 09/16/2023, 8:16 PM

## 2023-09-16 NOTE — Plan of Care (Signed)
   Problem: Education: Goal: Emotional status will improve Outcome: Progressing Goal: Mental status will improve Outcome: Progressing   Problem: Activity: Goal: Interest or engagement in activities will improve Outcome: Progressing Goal: Sleeping patterns will improve Outcome: Progressing   Problem: Safety: Goal: Periods of time without injury will increase Outcome: Progressing

## 2023-09-17 DIAGNOSIS — F19959 Other psychoactive substance use, unspecified with psychoactive substance-induced psychotic disorder, unspecified: Secondary | ICD-10-CM | POA: Diagnosis not present

## 2023-09-17 MED ORDER — HYDROXYZINE HCL 25 MG PO TABS
25.0000 mg | ORAL_TABLET | Freq: Three times a day (TID) | ORAL | Status: DC
  Administered 2023-09-17 – 2023-09-20 (×9): 25 mg via ORAL
  Filled 2023-09-17: qty 1
  Filled 2023-09-17 (×2): qty 42
  Filled 2023-09-17 (×11): qty 1
  Filled 2023-09-17: qty 42
  Filled 2023-09-17: qty 1

## 2023-09-17 MED ORDER — DULOXETINE HCL 60 MG PO CPEP
60.0000 mg | ORAL_CAPSULE | Freq: Every day | ORAL | Status: DC
  Administered 2023-09-18 – 2023-09-20 (×3): 60 mg via ORAL
  Filled 2023-09-17 (×4): qty 1

## 2023-09-17 NOTE — Group Note (Signed)
  LCSW Group Therapy Note  Group Date: 09/17/2023 Start Time: 10:00am End Time: 11:10am  Group Therapy:   Anger Management Skills  Participation Level:  Active  Description of Group:   In this group, patients learned how to recognize the physical, cognitive, emotional, and behavioral responses they have to anger-provoking situations.  They identified a recent time they became angry and how they reacted.  They analyzed how their reaction was possibly beneficial and how it was possibly unhelpful.  The group discussed a variety of healthier coping skills that could help with such a situation in the future.  Focus was placed on how helpful it is to recognize the underlying emotions to our anger, because working on those can lead to a more permanent solution as well as our ability to focus on the important rather than the urgent.  The group explored the issue of boundaries and long-term resentments as related to their anger.  CSW taught briefly about states of mind and reviewed TIPP skills, practicing a couple of these with the group as a whole.   Therapeutic Goals: Patients will identify someone/something that typically triggers them to anger as well as their typical coping skill, whether healthy or unhealthy. Patients will learn that anger is a secondary emotion and will see how working on the primary emotion is beneficial in the long-term to deal with angry reactions. Patients will learn basic CBT concept of Thought-Feeling-Action sequence and how helpful it is to challenge the thought in order to have a different feeling and behavior. Patients will be introduced to DBT concept of Emotional/Rational/Wise Mind and how to use TIPP skills to rapidly exit emotional state. Patients will explore possible new behaviors to use in future anger situations.   Summary of Patient Progress:  Patient was active during the group once he arrived, although he missed about the first half of group. He shared that he  often completely closes down when he becomes angry, will stop talking to people and such.  He demonstrated fair insight into the subject matter, was respectful of peers, and participated fully and enthusiastically once he arrived throughout the remainder of the session.  At the conclusion of group, patient shared a willingness to try prayer in the future to improve his anger management.  Therapeutic Modalities:   Cognitive Behavioral Therapy Dialectical Behavioral Therapy Processing  Lee Kass, LCSW 09/17/2023  12:55 PM

## 2023-09-17 NOTE — Plan of Care (Signed)
  Problem: Education: Goal: Emotional status will improve Outcome: Completed/Met Goal: Mental status will improve Outcome: Adequate for Discharge Goal: Verbalization of understanding the information provided will improve Outcome: Completed/Met   Problem: Activity: Goal: Interest or engagement in activities will improve Outcome: Progressing Goal: Sleeping patterns will improve Outcome: Progressing

## 2023-09-17 NOTE — Progress Notes (Addendum)
 [facility] MD Progress Note  09/17/2023 10:19 AM Lee Bridges  MRN:  [id number]  Per MAR review patient is compliant with medications on the unit PRN Robaxin  being used average once to twice daily, as needed Atarax  used average once daily for anxiety.  Chart review indicates patient is compliant with meds on the unit, prn atarax  being used daily for anxiety, last prn haldol  for agitation was given on 4/8. Patient was noted to have less incidence of irritability with staff, more pleasant.  Subjective:   Upon assessment today patient reports doing fairly in general but continues to report depressed mood and anxiety mainly related to his homelessness.  Despite the fact that he denies any passive or active SI intention or plan while in the hospital but he continues to be stressed about having no place to go and feeling suicidal if leaves without a place to go and without rehab treatment program in place.  He denies HI or AVH.  Denies craving to illicit drugs.  Reports motivation to attempt to contact [facility] today for potential place to go to, will follow.  Were also waiting on response for referral from [facility] program Monday morning.  He reports fair sleep and appetite.  He denies side effect of medications and agrees to comply.  He reports anxiety responding to Atarax  as needed but worsening when it wears off, discussed with patient scheduling Atarax  3 times daily, will follow.   Aadendum: patient reports SE of decreased appetite and drowsiness since started effexor , refuses to switch to hs using and wants to try another medicine, will change to trial of cymbalta  and monitor efficacy and safety.  Principal Problem: Substance-induced psychotic disorder (HCC) Diagnosis: Principal Problem:   Substance-induced psychotic disorder (HCC) Active Problems:   MDD (major depressive disorder), recurrent episode, moderate (HCC)  Total Time spent with patient: 30 minutes  Past Psychiatric History: See  H&P  Past Medical History:  Past Medical History:  Diagnosis Date   Depression    HIV (human immunodeficiency virus infection) (HCC)    Psychosis (HCC)    Substance abuse (HCC)    History reviewed. No pertinent surgical history. Family History:  Family History  Problem Relation Age of Onset   Arthritis Mother    Diabetes Father    Hypertension Father    Family Psychiatric  History: denies Social History:  Social History   Substance and Sexual Activity  Alcohol Use Not Currently   Comment: socially     Social History   Substance and Sexual Activity  Drug Use Yes   Types: Cocaine, Marijuana, Methamphetamines   Comment: weekly    Social History   Socioeconomic History   Marital status: Single    Spouse name: Not on file   Number of children: 0   Years of education: Not on file   Highest education level: Associate degree: academic program  Occupational History   Not on file  Tobacco Use   Smoking status: Every Day    Current packs/day: 0.50    Types: Cigarettes   Smokeless tobacco: Never   Tobacco comments:    1 PPD  Substance and Sexual Activity   Alcohol use: Not Currently    Comment: socially   Drug use: Yes    Types: Cocaine, Marijuana, Methamphetamines    Comment: weekly   Sexual activity: Not Currently  Other Topics Concern   Not on file  Social History Narrative   Homeless x 2 yrs.    Social Drivers of Health  Financial Resource Strain: Not on file  Food Insecurity: Food Insecurity Present (09/06/2023)   Hunger Vital Sign    Worried About Running Out of Food in the Last Year: Often true    Ran Out of Food in the Last Year: Often true  Transportation Needs: Unmet Transportation Needs (09/06/2023)   PRAPARE - Administrator, Civil Service (Medical): Yes    Lack of Transportation (Non-Medical): Yes  Physical Activity: Not on file  Stress: Not on file  Social Connections: Socially Isolated (08/09/2023)   Social Connection and Isolation  Panel [NHANES]    Frequency of Communication with Friends and Family: Never    Frequency of Social Gatherings with Friends and Family: Never    Attends Religious Services: Never    Database administrator or Organizations: No    Attends Engineer, structural: Never    Marital Status: Never married   Additional Social History:                         Sleep: Fair  Appetite:  Fair  Current Medications: Current Facility-Administered Medications  Medication Dose Route Frequency Provider Last Rate Last Admin   acetaminophen  (TYLENOL ) tablet 650 mg  650 mg Oral Q6H PRN Roise Cleaver, NP   650 mg at 09/15/23 0912   alum & mag hydroxide-simeth (MAALOX/MYLANTA) 200-200-20 MG/5ML suspension 30 mL  30 mL Oral Q4H PRN Roise Cleaver, NP   30 mL at 09/09/23 1114   atovaquone  (MEPRON ) 750 MG/5ML suspension 1,500 mg  1,500 mg Oral Q breakfast Zouev, Dmitri, MD   1,500 mg at 09/17/23 0745   bictegravir-emtricitabine -tenofovir  AF (BIKTARVY ) 50-200-25 MG per tablet 1 tablet  1 tablet Oral Daily Orson Blalock, NP   1 tablet at 09/17/23 0745   haloperidol  (HALDOL ) tablet 5 mg  5 mg Oral TID PRN Roise Cleaver, NP   5 mg at 09/06/23 2015   And   diphenhydrAMINE  (BENADRYL ) capsule 50 mg  50 mg Oral TID PRN Roise Cleaver, NP   50 mg at 09/10/23 2151   haloperidol  lactate (HALDOL ) injection 5 mg  5 mg Intramuscular TID PRN Roise Cleaver, NP       And   diphenhydrAMINE  (BENADRYL ) injection 50 mg  50 mg Intramuscular TID PRN Roise Cleaver, NP       And   LORazepam  (ATIVAN ) injection 2 mg  2 mg Intramuscular TID PRN Roise Cleaver, NP       haloperidol  lactate (HALDOL ) injection 10 mg  10 mg Intramuscular TID PRN Roise Cleaver, NP       And   diphenhydrAMINE  (BENADRYL ) injection 50 mg  50 mg Intramuscular TID PRN Roise Cleaver, NP       And   LORazepam  (ATIVAN ) injection 2 mg  2 mg Intramuscular TID PRN Roise Cleaver, NP       diphenhydrAMINE -zinc  acetate  (BENADRYL ) 2-0.1 % cream   Topical BID PRN Ajibola, Ene A, NP       hydrOXYzine  (ATARAX ) tablet 25 mg  25 mg Oral TID PRN Roise Cleaver, NP   25 mg at 09/16/23 2126   magnesium  hydroxide (MILK OF MAGNESIA) suspension 30 mL  30 mL Oral Daily PRN Roise Cleaver, NP       methocarbamol  (ROBAXIN ) tablet 500 mg  500 mg Oral Q6H PRN Zouev, Dmitri, MD   500 mg at 09/16/23 2126   nicotine  (NICODERM CQ  - dosed in mg/24 hours) patch 14 mg  14 mg  Transdermal Daily Zouev, Dmitri, MD   14 mg at 09/17/23 0746   nystatin  cream (MYCOSTATIN )   Topical BID Zouev, Dmitri, MD   Given at 09/17/23 0747   QUEtiapine  (SEROQUEL  XR) 24 hr tablet 400 mg  400 mg Oral QHS Zouev, Dmitri, MD   400 mg at 09/16/23 2126   venlafaxine  XR (EFFEXOR -XR) 24 hr capsule 75 mg  75 mg Oral Daily Emslee Lopezmartinez, MD   75 mg at 09/17/23 0745    Lab Results:  No results found for this or any previous visit (from the past 48 hours).    Blood Alcohol level:  Lab Results  Component Value Date   ETH <10 09/05/2023   ETH <10 08/08/2023    Metabolic Disorder Labs: Lab Results  Component Value Date   HGBA1C 4.6 (L) 08/12/2023   MPG 85.32 08/12/2023   No results found for: "PROLACTIN" Lab Results  Component Value Date   CHOL 167 08/12/2023   TRIG 239 (H) 08/12/2023   HDL 30 (L) 08/12/2023   CHOLHDL 5.6 08/12/2023   VLDL 48 (H) 08/12/2023   LDLCALC 89 08/12/2023     Musculoskeletal: Strength & Muscle Tone: within normal limits Gait & Station: normal Patient leans: N/A  Psychiatric Specialty Exam:  Presentation  General Appearance:  Appropriate for Environment  Eye Contact: Fair  Speech: Clear and Coherent  Speech Volume: Normal  Handedness: Right   Mood and Affect  Mood: Pleasant and calm  Affect: Congruent   Thought Process  Thought Processes: Coherent  Descriptions of Associations:Intact  Orientation:Full (Time, Place and Person)  Thought Content:Logical  History of  Schizophrenia/Schizoaffective disorder:No  Duration of Psychotic Symptoms:Greater than six months  Hallucinations:patient reports AH of multiple voices noncommand in nature but has not been observed to be RTIS  Ideas of Reference:None  Suicidal Thoughts: Denies passive or active SI intention or plan but unable to contract for safety outside the hospital  Homicidal Thoughts:denies HI   Sensorium  Memory: Immediate Fair  Judgment: Fair  Insight: Fair   Art therapist  Concentration: Fair  Attention Span: Fair  Recall: [employer] of Knowledge: Fair  Language: Fair   Psychomotor Activity  Psychomotor Activity: No data recorded   Assets  Assets: Communication Skills   Sleep  Sleep: fair    Physical Exam: Physical exam: Please see exam on admit note. General: Well developed, well nourished.  Pupils: Normal at 3mm Respiratory: Breathing is unlabored.  Cardiovascular: No edema.  Language: No anomia, no aphasia Muscle strength and tone-pt moving all extremities.  Gait not assessed as pt remained in bed.  Neuro: Facial muscles are symmetric. Pt without tremor, no evidence of hyperarousal.  Review of Systems  Constitutional: Negative.   HENT: Negative.    Eyes: Negative.   Respiratory: Negative.    Cardiovascular: Negative.   Gastrointestinal: Negative.   Genitourinary: Negative.   Musculoskeletal: Negative.   Skin: Negative.   Neurological: Negative.  Tremors: discontinue effexorstart cymbalta  trial 60 mg daily for depression.  Endo/Heme/Allergies: Negative.   Psychiatric/Behavioral: Negative.    All other systems reviewed and are negative.  Blood pressure 128/83, pulse 99, temperature 97.8 F (36.6 C), temperature source Oral, resp. rate 17, height 5\' 2"  (1.575 m), weight 57.2 kg, SpO2 99%. Body mass index is 23.05 kg/m.   Treatment Plan Summary:   Daily contact with patient to assess and evaluate symptoms and progress in treatment,  Medication management, and Plan:   - Continue Seroquel  XR 400  mg at bedtime  which patient has historically had a positive response to per documentation and has been less agitated with increase with less mood lability, no longer threatening to kill himself or kill his roommate and reports benefit from medication  Discontinue effexor  for Se reported Start trial cymbalta  60 mg daily for depression and monitor efficacy and safety Start vistaril  25 mg tid scheduled in addition to Prn for anxiety  Will follow regarding referral to [facility] for inpatient rehab treatment or [facility].   Will have patient scheduled for follow-up with infectious disease after discharge as well as primary care provider.  Will also have patient referred for outpatient treatment including medication management and counseling.  Noted to patient that he has access to transportation services through [facility] to help him comply with outpatient follow-up scheduled.  Observation Level/Precautions:  15 minute checks  Laboratory:  CBC Chemistry Profile UDS UA Vitamin B-12  Psychotherapy:  will attend group therapy  Medications:  Seroquel  and Trintellix   Consultations:  curbside consult with ID (ordered CD4 counts as well as HIV testing)  Discharge Concerns: Will continue to attempt referral to sober living or rehab treatment, will need outpatient follow-up with infectious disease provider for HIV  Estimated LOS:3-5 days  Other:       Medical Consult: curbside consult with outpatient provider Patient had CD4 drawn but not RPR or HIV-RNA so I reordered labs and will followup Vital signs stable  Reached out to [facility] regarding patient's lab results and will followup: CD4:258, RPR (reactive, titer 1:32); The HIV RNA lab looks like is still pending  See noted from 4/10 with following recs: "In review of his chart and records I Kiante't feel strongly he needs an additional dose of the bicillin  as his infection appears to  be early between July 2024 and October 2024.    I will repeat a RPR, CD4 and HIV RNA level (last one was 196,000 copies in February).    Continue biktarvy  once daily, regarding pneumocystis prophylaxis he can continue either Bactrim  1 SS daily or Atovaquone  1500 mg daily. 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 That was a bit of an overkill recommendation - which is OK to be over cautious sometimes.  His CD4 is better - I would say he should continue the atovaquone  another 3 months then we can stop it IF he stays on his Biktarvy  more consistently - which he needs to continue of course lifelong.  I will take a peak at the viral load Monday "     Physician Treatment Plan for Primary Diagnosis: Substance-induced psychotic disorder (HCC) Long Term Goal(s): Improvement in symptoms so as ready for discharge   Short Term Goals: Ability to identify changes in lifestyle to reduce recurrence of condition will improve, Ability to verbalize feelings will improve, Ability to disclose and discuss suicidal ideas, Ability to demonstrate self-control will improve, Ability to identify and develop effective coping behaviors will improve, Ability to maintain clinical measurements within normal limits will improve, Compliance with prescribed medications will improve, and Ability to identify triggers associated with substance abuse/mental health issues will improve   Physician Treatment Plan for Secondary Diagnosis: Principal Problem:   Substance-induced psychotic disorder (HCC) Active Problems:   MDD (  major depressive disorder), recurrent episode, moderate (HCC)   Long Term Goal(s): Improvement in symptoms so as ready for discharge   Short  Term Goals: Ability to identify changes in lifestyle to reduce recurrence of condition will improve, Ability to verbalize feelings will improve, Ability to disclose and discuss suicidal ideas, Ability to demonstrate self-control will improve, Ability to identify and develop effective coping behaviors will improve, Ability to maintain clinical measurements within normal limits will improve, and Compliance with prescribed medications will improve   I certify that inpatient services furnished can reasonably be expected to improve the patient's condition.     Aamiyah Derrick Linnie Riches, MD 09/17/2023, 10:19 AM

## 2023-09-17 NOTE — Progress Notes (Signed)
   09/16/23 2200  Psych Admission Type (Psych Patients Only)  Admission Status Voluntary  Psychosocial Assessment  Patient Complaints Anxiety;Depression (anxiety 10/10, depression 10/10)  Eye Contact Fair  Facial Expression Animated  Affect Appropriate to circumstance  Speech Rapid  Interaction Assertive  Motor Activity Restless  Appearance/Hygiene Unremarkable  Behavior Characteristics Cooperative  Mood Pleasant  Thought Process  Coherency WDL  Content WDL  Delusions None reported or observed  Perception WDL  Hallucination None reported or observed  Judgment Impaired  Confusion None  Danger to Self  Current suicidal ideation? Denies  Self-Injurious Behavior No self-injurious ideation or behavior indicators observed or expressed   Agreement Not to Harm Self Yes  Description of Agreement verbal  Danger to Others  Danger to Others None reported or observed

## 2023-09-17 NOTE — Progress Notes (Signed)
   09/17/23 0800  Psych Admission Type (Psych Patients Only)  Admission Status Voluntary  Psychosocial Assessment  Patient Complaints Anxiety;Depression (Rates anxiety 10/10 and depression 10/10)  Eye Contact Fair  Facial Expression Animated  Affect Appropriate to circumstance  Speech Logical/coherent  Interaction Assertive  Motor Activity Restless  Appearance/Hygiene Unremarkable  Behavior Characteristics Cooperative  Mood Pleasant  Thought Process  Coherency WDL  Content WDL  Delusions None reported or observed  Perception WDL  Hallucination None reported or observed  Judgment Poor  Confusion None  Danger to Self  Current suicidal ideation? Denies  Agreement Not to Harm Self Yes  Description of Agreement verbal  Danger to Others  Danger to Others None reported or observed

## 2023-09-18 DIAGNOSIS — F19959 Other psychoactive substance use, unspecified with psychoactive substance-induced psychotic disorder, unspecified: Secondary | ICD-10-CM | POA: Diagnosis not present

## 2023-09-18 NOTE — Plan of Care (Signed)
  Problem: Education: Goal: Mental status will improve Outcome: Progressing   Problem: Activity: Goal: Interest or engagement in activities will improve Outcome: Progressing Goal: Sleeping patterns will improve Outcome: Progressing   Problem: Coping: Goal: Ability to verbalize frustrations and anger appropriately will improve Outcome: Progressing Goal: Ability to demonstrate self-control will improve Outcome: Progressing   Problem: Health Behavior/Discharge Planning: Goal: Identification of resources available to assist in meeting health care needs will improve Outcome: Progressing Goal: Compliance with treatment plan for underlying cause of condition will improve Outcome: Progressing

## 2023-09-18 NOTE — Progress Notes (Signed)
 Around  2010 patient became irritable and agitated while in the Day Room. Patient stated "everyone is sitting around talking about going home and there plans for discharge, no one is talking to me about my plans. Social Work came in today and said she would talk to me in a few minutes and never came back." Patient began posturing and making verbal threats towards other patients. Staff was able to remove patient from day room as he continued to  make verbal threats and gestures. Initial verbal de-escalation attempts were unsuccessful. Patient was asked to take scheduled HS meds and po mild agitation meds but refused. Verbal de-escalation attempts were continued. Patient agreed to take all medications and requested that he be put in a room by himself. PO Benadryl  50mg  and Haldol  5mg  given in addition to scheduled HS medications. Decision was made to move patient from [address] to [address] for patient and staff safety. This decision was approved by Press photographer and AC. Transfer to [address]1 was made without incident, report given to [facility] [employer].

## 2023-09-18 NOTE — Progress Notes (Signed)
 [facility] MD Progress Note  09/18/2023 9:48 AM Lee Bridges  MRN:  [id number]  Per MAR review patient is compliant with medications on the unit PRN Robaxin  being used average once to twice daily, as needed Atarax  used average once daily for anxiety.  Chart review indicates patient is compliant with meds on the unit, prn atarax  being used daily for anxiety, per chart review and staff report last night patient had an episode of irritability and some verbal agitation towards staff and other peers was not redirectable verbally, was transferred to 500 hall and was given Haldol  and Benadryl  as needed.  Subjective:   Upon assessment today patient reports he got upset last night while in the dayroom "because everybody was talking about their discharge plan and I do not have a discharge plan it made me upset" he denies feeling irritable now but continues to report feeling depressed and anxious mainly related to homelessness, he reports feeling hopelessness about his situation being homeless and having no place to go to.  We are still awaiting an update from [facility] for the referral submitted late last week, he also plans to call [facility] house today, will follow.  He denies passive or active SI intention or plan while in the hospital but continues to be unable to contract for safety outside the hospital given his stress about homelessness situation.  He denies side effect of medications, Effexor  was switched to Cymbalta  starting today, he presents call and pleasant but appears depressed and flat.   Principal Problem: Substance-induced psychotic disorder (HCC) Diagnosis: Principal Problem:   Substance-induced psychotic disorder (HCC) Active Problems:   MDD (major depressive disorder), recurrent episode, moderate (HCC)  Total Time spent with patient: 30 minutes  Past Psychiatric History: See H&P  Past Medical History:  Past Medical History:  Diagnosis Date   Depression    HIV (human immunodeficiency  virus infection) (HCC)    Psychosis (HCC)    Substance abuse (HCC)    History reviewed. No pertinent surgical history. Family History:  Family History  Problem Relation Age of Onset   Arthritis Mother    Diabetes Father    Hypertension Father    Family Psychiatric  History: denies Social History:  Social History   Substance and Sexual Activity  Alcohol Use Not Currently   Comment: socially     Social History   Substance and Sexual Activity  Drug Use Yes   Types: Cocaine, Marijuana, Methamphetamines   Comment: weekly    Social History   Socioeconomic History   Marital status: Single    Spouse name: Not on file   Number of children: 0   Years of education: Not on file   Highest education level: Associate degree: academic program  Occupational History   Not on file  Tobacco Use   Smoking status: Every Day    Current packs/day: 0.50    Types: Cigarettes   Smokeless tobacco: Never   Tobacco comments:    1 PPD  Substance and Sexual Activity   Alcohol use: Not Currently    Comment: socially   Drug use: Yes    Types: Cocaine, Marijuana, Methamphetamines    Comment: weekly   Sexual activity: Not Currently  Other Topics Concern   Not on file  Social History Narrative   Homeless x 2 yrs.    Social Drivers of Corporate investment banker Strain: Not on file  Food Insecurity: Food Insecurity Present (09/06/2023)   Hunger Vital Sign    Worried About  Running Out of Food in the Last Year: Often true    Ran Out of Food in the Last Year: Often true  Transportation Needs: Unmet Transportation Needs (09/06/2023)   PRAPARE - Administrator, Civil Service (Medical): Yes    Lack of Transportation (Non-Medical): Yes  Physical Activity: Not on file  Stress: Not on file  Social Connections: Socially Isolated (08/09/2023)   Social Connection and Isolation Panel [NHANES]    Frequency of Communication with Friends and Family: Never    Frequency of Social Gatherings with  Friends and Family: Never    Attends Religious Services: Never    Database administrator or Organizations: No    Attends Engineer, structural: Never    Marital Status: Never married   Additional Social History:                         Sleep: Fair  Appetite:  Fair  Current Medications: Current Facility-Administered Medications  Medication Dose Route Frequency Provider Last Rate Last Admin   acetaminophen  (TYLENOL ) tablet 650 mg  650 mg Oral Q6H PRN Roise Cleaver, NP   650 mg at 09/15/23 0912   alum & mag hydroxide-simeth (MAALOX/MYLANTA) 200-200-20 MG/5ML suspension 30 mL  30 mL Oral Q4H PRN Roise Cleaver, NP   30 mL at 09/09/23 1114   atovaquone  (MEPRON ) 750 MG/5ML suspension 1,500 mg  1,500 mg Oral Q breakfast Zouev, Dmitri, MD   1,500 mg at 09/18/23 0824   bictegravir-emtricitabine -tenofovir  AF (BIKTARVY ) 50-200-25 MG per tablet 1 tablet  1 tablet Oral Daily Orson Blalock, NP   1 tablet at 09/18/23 4010   haloperidol  (HALDOL ) tablet 5 mg  5 mg Oral TID PRN Roise Cleaver, NP   5 mg at 09/17/23 2028   And   diphenhydrAMINE  (BENADRYL ) capsule 50 mg  50 mg Oral TID PRN Roise Cleaver, NP   50 mg at 09/17/23 2028   haloperidol  lactate (HALDOL ) injection 5 mg  5 mg Intramuscular TID PRN Roise Cleaver, NP       And   diphenhydrAMINE  (BENADRYL ) injection 50 mg  50 mg Intramuscular TID PRN Roise Cleaver, NP       And   LORazepam  (ATIVAN ) injection 2 mg  2 mg Intramuscular TID PRN Roise Cleaver, NP       haloperidol  lactate (HALDOL ) injection 10 mg  10 mg Intramuscular TID PRN Roise Cleaver, NP       And   diphenhydrAMINE  (BENADRYL ) injection 50 mg  50 mg Intramuscular TID PRN Roise Cleaver, NP       And   LORazepam  (ATIVAN ) injection 2 mg  2 mg Intramuscular TID PRN Roise Cleaver, NP       diphenhydrAMINE -zinc  acetate (BENADRYL ) 2-0.1 % cream   Topical BID PRN Ajibola, Ene A, NP       DULoxetine  (CYMBALTA ) DR capsule 60 mg  60 mg Oral  Daily Stephen Baruch, MD   60 mg at 09/18/23 0824   hydrOXYzine  (ATARAX ) tablet 25 mg  25 mg Oral TID PRN Alver Jobs, MD   25 mg at 09/16/23 2126   hydrOXYzine  (ATARAX ) tablet 25 mg  25 mg Oral TID Alver Jobs, MD   25 mg at 09/18/23 2725   magnesium  hydroxide (MILK OF MAGNESIA) suspension 30 mL  30 mL Oral Daily PRN Roise Cleaver, NP       methocarbamol  (ROBAXIN ) tablet 500 mg  500 mg Oral Q6H PRN Zouev, Dmitri, MD  500 mg at 09/17/23 2137   nicotine  (NICODERM CQ  - dosed in mg/24 hours) patch 14 mg  14 mg Transdermal Daily Zouev, Dmitri, MD   14 mg at 09/18/23 0825   nystatin  cream (MYCOSTATIN )   Topical BID Zouev, Dmitri, MD   Given at 09/18/23 6045   QUEtiapine  (SEROQUEL  XR) 24 hr tablet 400 mg  400 mg Oral QHS Zouev, Dmitri, MD   400 mg at 09/17/23 2028    Lab Results:  No results found for this or any previous visit (from the past 48 hours).    Blood Alcohol level:  Lab Results  Component Value Date   ETH <10 09/05/2023   ETH <10 08/08/2023    Metabolic Disorder Labs: Lab Results  Component Value Date   HGBA1C 4.6 (L) 08/12/2023   MPG 85.32 08/12/2023   No results found for: "PROLACTIN" Lab Results  Component Value Date   CHOL 167 08/12/2023   TRIG 239 (H) 08/12/2023   HDL 30 (L) 08/12/2023   CHOLHDL 5.6 08/12/2023   VLDL 48 (H) 08/12/2023   LDLCALC 89 08/12/2023     Musculoskeletal: Strength & Muscle Tone: within normal limits Gait & Station: normal Patient leans: N/A  Psychiatric Specialty Exam:  Presentation  General Appearance:  Appropriate for Environment  Eye Contact: Fair  Speech: Clear and Coherent  Speech Volume: Normal  Handedness: Right   Mood and Affect  Mood: Dysphoric, calm  Affect: Congruent, restricted sad affect   Thought Process  Thought Processes: Coherent  Descriptions of Associations:Intact  Orientation:Full (Time, Place and Person)  Thought Content:Logical  History of Schizophrenia/Schizoaffective  disorder:No  Duration of Psychotic Symptoms:Greater than six months  Hallucinations:patient reports AH of multiple voices noncommand in nature but has not been observed to be RTIS  Ideas of Reference:None  Suicidal Thoughts: Denies passive or active SI intention or plan but unable to contract for safety outside the hospital  Homicidal Thoughts:denies HI   Sensorium  Memory: Immediate Fair  Judgment: Limited  Insight: Limited   Art therapist  Concentration: Fair  Attention Span: Fair  Recall: [employer] of Knowledge: Fair  Language: Fair   Psychomotor Activity  Psychomotor Activity: No data recorded   Assets  Assets: Communication Skills   Sleep  Sleep: fair    Physical Exam: Physical exam: Please see exam on admit note. General: Well developed, well nourished.  Pupils: Normal at 3mm Respiratory: Breathing is unlabored.  Cardiovascular: No edema.  Language: No anomia, no aphasia Muscle strength and tone-pt moving all extremities.  Gait not assessed as pt remained in bed.  Neuro: Facial muscles are symmetric. Pt without tremor, no evidence of hyperarousal.  Review of Systems  Constitutional: Negative.   HENT: Negative.    Eyes: Negative.   Respiratory: Negative.    Cardiovascular: Negative.   Gastrointestinal: Negative.   Genitourinary: Negative.   Musculoskeletal: Negative.   Skin: Negative.   Neurological: Negative.  Tremors: discontinue effexorstart cymbalta  trial 60 mg daily for depression.  Endo/Heme/Allergies: Negative.   Psychiatric/Behavioral: Negative.    All other systems reviewed and are negative.  Blood pressure 122/79, pulse 89, temperature 98.1 F (36.7 C), resp. rate 17, height 5\' 2"  (1.575 m), weight 57.2 kg, SpO2 100%. Body mass index is 23.05 kg/m.   Treatment Plan Summary:   Daily contact with patient to assess and evaluate symptoms and progress in treatment, Medication management, and Plan:   - Continue  Seroquel  XR 400  mg at bedtime for mood and depression, also  helps with sleep and anxiety   Start trial cymbalta  60 mg daily on 4/20 for depression and monitor efficacy and safety Continue vistaril  25 mg tid scheduled in addition to Prn for anxiety  Will follow regarding referral to [facility] for inpatient rehab treatment or [facility].   Will have patient scheduled for follow-up with infectious disease after discharge as well as primary care provider.  Will also have patient referred for outpatient treatment including medication management and counseling.  Noted to patient that he has access to transportation services through [facility] to help him comply with outpatient follow-up scheduled.  Observation Level/Precautions:  15 minute checks  Laboratory:  CBC Chemistry Profile UDS UA Vitamin B-12  Psychotherapy:  will attend group therapy  Medications:  Seroquel  and Trintellix   Consultations:  curbside consult with ID (ordered CD4 counts as well as HIV testing)  Discharge Concerns: Will continue to attempt referral to sober living or rehab treatment, will need outpatient follow-up with infectious disease provider for HIV  Estimated LOS:3-5 days  Other:       Medical Consult: curbside consult with outpatient provider Patient had CD4 drawn but not RPR or HIV-RNA so I reordered labs and will followup Vital signs stable  Reached out to [facility] regarding patient's lab results and will followup: CD4:258, RPR (reactive, titer 1:32); The HIV RNA lab looks like is still pending  See noted from 4/10 with following recs: "In review of his chart and records I Jabin't feel strongly he needs an additional dose of the bicillin  as his infection appears to be early between July 2024 and October 2024.    I will repeat a RPR, CD4 and HIV RNA level (last one was 196,000 copies in February).    Continue biktarvy  once daily, regarding pneumocystis prophylaxis he can continue either Bactrim  1 SS daily  or Atovaquone  1500 mg daily. 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 That was a bit of an overkill recommendation - which is OK to be over cautious sometimes.  His CD4 is better - I would say he should continue the atovaquone  another 3 months then we can stop it IF he stays on his Biktarvy  more consistently - which he needs to continue of course lifelong.  I will take a peak at the viral load Monday "     Physician Treatment Plan for Primary Diagnosis: Substance-induced psychotic disorder (HCC) Long Term Goal(s): Improvement in symptoms so as ready for discharge   Short Term Goals: Ability to identify changes in lifestyle to reduce recurrence of condition will improve, Ability to verbalize feelings will improve, Ability to disclose and discuss suicidal ideas, Ability to demonstrate self-control will improve, Ability to identify and develop effective coping behaviors will improve, Ability to maintain clinical measurements within normal limits will improve, Compliance with prescribed medications will improve, and Ability to identify triggers associated with substance abuse/mental health issues will improve   Physician Treatment Plan for Secondary Diagnosis: Principal Problem:   Substance-induced psychotic disorder (HCC) Active Problems:   MDD (major depressive disorder), recurrent episode, moderate (HCC)   Long Term Goal(s): Improvement in symptoms so as ready for discharge   Short Term Goals: Ability to identify changes in lifestyle to reduce recurrence  of condition will improve, Ability to verbalize feelings will improve, Ability to disclose and discuss suicidal ideas, Ability to demonstrate self-control will improve, Ability  to identify and develop effective coping behaviors will improve, Ability to maintain clinical measurements within normal limits will improve, and Compliance with prescribed medications will improve   I certify that inpatient services furnished can reasonably be expected to improve the patient's condition.     Damonie Ellenwood Linnie Riches, MD 09/18/2023, 9:48 AM

## 2023-09-18 NOTE — Plan of Care (Signed)
  Problem: Education: Goal: Mental status will improve Outcome: Progressing   Problem: Activity: Goal: Interest or engagement in activities will improve Outcome: Progressing Goal: Sleeping patterns will improve Outcome: Progressing   Problem: Coping: Goal: Ability to verbalize frustrations and anger appropriately will improve Outcome: Progressing Goal: Ability to demonstrate self-control will improve Outcome: Progressing   

## 2023-09-18 NOTE — Progress Notes (Signed)
 Patient ID: Lee Bridges, male   DOB: 09-07-1990, 33 y.o.   MRN: [id number] Assumed care of pt at 1300. Pt visible on the unit, able to make his needs known. He has voiced no concerns and appears logical and goal oriented in his requests. Has been provided new hygiene items and scrubs/toiletries. Pt is safe. Will con't to monitor.

## 2023-09-18 NOTE — Group Note (Signed)
 Date:  09/18/2023 Time:  8:43 PM  Group Topic/Focus:  Wrap-Up Group:   The focus of this group is to help patients review their daily goal of treatment and discuss progress on daily workbooks.    Participation Level:  Active  Participation Quality:  Appropriate  Affect:  Appropriate  Cognitive:  Appropriate  Insight: Appropriate  Engagement in Group:  Engaged  Modes of Intervention:  Discussion  Additional Comments:   Pt states that he had a better day today than yesterday and is adjusting well to his new environment. Pt has been asleep for majority of the day so he hasn't participated much. Pt has no serious concerns for doctor beyond d/c  Adalei Novell A Nonie Lochner 09/18/2023, 8:43 PM

## 2023-09-18 NOTE — Progress Notes (Signed)
   09/17/23 2000  Psych Admission Type (Psych Patients Only)  Admission Status Voluntary  Psychosocial Assessment  Patient Complaints Anxiety;Depression;Irritability  Eye Contact Fair  Facial Expression Sad  Affect Appropriate to circumstance  Speech Rapid  Interaction Assertive  Motor Activity Restless  Appearance/Hygiene Unremarkable  Behavior Characteristics Cooperative;Irritable  Mood Irritable  Thought Process  Coherency WDL  Content WDL  Delusions None reported or observed  Perception WDL  Hallucination None reported or observed  Judgment Impaired  Confusion None  Danger to Self  Current suicidal ideation? Passive  Description of Suicide Plan Hang himself after discharge, or find a gun.  Self-Injurious Behavior No self-injurious ideation or behavior indicators observed or expressed   Agreement Not to Harm Self Yes  Description of Agreement verbal  Danger to Others  Danger to Others None reported or observed  Danger to Others Abnormal  Harmful Behavior to others No threats or harm toward other people

## 2023-09-18 NOTE — Progress Notes (Signed)
 Pt transferred from 300 unit and received on 500 unit about 2100. Pt engaged in pleasant conversation and was calm/cooperative.  Pt reported 7/10 pain and received Robaxin  at 2137. Pt then rested in bed for the night.

## 2023-09-19 ENCOUNTER — Other Ambulatory Visit (HOSPITAL_COMMUNITY): Payer: Self-pay

## 2023-09-19 DIAGNOSIS — F19959 Other psychoactive substance use, unspecified with psychoactive substance-induced psychotic disorder, unspecified: Secondary | ICD-10-CM | POA: Diagnosis not present

## 2023-09-19 MED ORDER — HYDROXYZINE HCL 25 MG PO TABS
25.0000 mg | ORAL_TABLET | Freq: Three times a day (TID) | ORAL | 0 refills | Status: DC
  Filled 2023-09-19: qty 30, 10d supply, fill #0

## 2023-09-19 MED ORDER — ATOVAQUONE 750 MG/5ML PO SUSP: 1500.0000 mg | Freq: Every day | ORAL | 0 refills | Status: DC

## 2023-09-19 MED ORDER — HYDROXYZINE HCL 25 MG PO TABS: 25.0000 mg | ORAL_TABLET | Freq: Three times a day (TID) | ORAL | 0 refills | Status: DC

## 2023-09-19 MED ORDER — DULOXETINE HCL 60 MG PO CPEP
60.0000 mg | ORAL_CAPSULE | Freq: Every day | ORAL | 0 refills | Status: DC
  Filled 2023-09-19: qty 30, 30d supply, fill #0

## 2023-09-19 NOTE — Progress Notes (Signed)
  [facility] Adult Case Management Discharge Plan :  Will you be returning to the same living situation after discharge:  No. Pt is discharging to [facility] At discharge, do you have transportation home?: Yes,  CSW arranged [employer] taxi for 815AM Do you have the ability to pay for your medications: Yes,  pt has active health insurance, pt is also getting medications and scripts at discharge  Release of information consent forms completed and in the chart;  Patient's signature needed at discharge.  Patient to Follow up at:  Follow-up Information     [facility] Go on 09/15/2023.   Specialty: Professional Counselor Why: Please go to this provider after completing [facility] for therapy services.  You may go during new patient walk in hours, Monday through Friday, from 9:00 am to 1:00 pm. Contact information: [address]  [address] [place] [address] [phone]         [facility]. Call.   Specialty: Behavioral Health Why: Please follow up with Dr. Melisa Spray after completing [facility] programming for medication management. Contact information: [address] [place]  [phone]        [facility] [place]. [facility]. Go on 10/05/2023.   Specialty: Infectious Diseases Why: You have an appointment with your provider, Gibson Kurtz NP on Wednesday, May 7th at [facility] information: [address] [place], [address] [place]  [address] [phone]        [facility]. Go on 09/20/2023.   Why: You have been accepted to [facility] for substance use treatment. You will receive therapy and medication management while there. You will arrive there by 9:00AM. Contact information: [address] [place] [address] [phone]                 Next level of care provider has access to  [facility] Link:no  Safety Planning and Suicide Prevention discussed: Yes,  information reviewed with patient, consents were declined     Has patient been referred to the Quitline?: Patient refused referral for treatment  Patient has been referred for addiction treatment: Yes, patient is discharging to substance use treatment at [facility].  Vonzell Guerin, LCSWA 09/19/2023, 1:47 PM

## 2023-09-19 NOTE — Plan of Care (Addendum)
 Pt A & O X4. Denies SI, HI, AVH and pain when assessed. Remains medication compliant without adverse drug reactions. Visible in milieu without outburst. Attended scheduled groups, engaged in activities off unit. Safety checks maintained at Q 15 minutes intervals without issues. Tolerated all meals and fluids well.   Problem: Activity: Goal: Interest or engagement in activities will improve Outcome: Progressing   Problem: Physical Regulation: Goal: Ability to maintain clinical measurements within normal limits will improve Outcome: Progressing   Problem: Safety: Goal: Periods of time without injury will increase Outcome: Progressing

## 2023-09-19 NOTE — [facility] Suicide Risk Assessment (Addendum)
 [facility] Discharge Suicide Risk Assessment   Principal Problem: Substance-induced psychotic disorder [facility]) Discharge Diagnoses: Principal Problem:   Substance-induced psychotic disorder (HCC) Active Problems:   MDD (major depressive disorder), recurrent episode, moderate (HCC)   Total Time spent with patient: 45 minutes  Reason for admission: "33 year old male presents emergency department with complaints of auditory/visual sedation as well as suicidal ideation.  States he has been having symptoms for the past couple of weeks.  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  States that he had some of his behavioral health medications adjusted at that time with increasing his dose of Seroquel  and addition of another medication that he cannot exactly remember.  States that while he is in the hospital, began to see people that were not there as well as hear people talking that were not there.  States that the symptoms have become more regular ever since being discharged over the past week or 2.  States that the voices are telling him to harm himself.  States that earlier today, had a desire to "blow my brains out" or "stab myself."  Patient denies any direct plan.  Denies any access to a firearm.  States that he feels like this is related to his medication that he was started on.  Denies any visual disturbance, gait abnormality, slurred speech, facial droop, weakness with history of reasonable extremities.  Denies any substance use.  States that he does have a history of polysubstance use with most recent use 3 weeks ago.   PTA Medications:  Medications Prior to Admission  Medication Sig Dispense Refill Last Dose/Taking   atovaquone  (MEPRON ) 750 MG/5ML suspension Take 10 mLs (1,500 mg total) by mouth daily with breakfast. 300 mL 0     methocarbamol  (ROBAXIN ) 500 MG tablet Take 1 tablet (500 mg total) by mouth every 6 (six)  hours as needed for muscle spasms. 20 tablet 0     QUEtiapine  (SEROQUEL ) 100 MG tablet Take 1 tablet (100 mg total) by mouth at bedtime. 30 tablet 0     sertraline  (ZOLOFT ) 50 MG tablet Take 1 tablet (50 mg total) by mouth daily. 30 tablet 0      Hospital Course:   During the patient's hospitalization, patient had extensive initial psychiatric evaluation, and follow-up psychiatric evaluations every day.  Psychiatric diagnoses provided upon initial assessment: Principal Problem:   Substance-induced psychotic disorder (HCC) Active Problems:   MDD (major depressive disorder), recurrent episode, moderate (HCC)  Patient's psychiatric medications were adjusted on admission: Seroquel  XR was started 400 mg at bedtime for mood and sleep, Trintellix  was restarted 10 mg daily for depression  During the hospitalization, other adjustments were made to the patient's psychiatric medication regimen: Trintellix  was titrated to 20 mg daily for depression but later discontinued secondary to lack of coverage with insurance, Effexor  was started for depression but patient reported side effect of dry mouth and decreased appetite Effexor  was discontinued and Cymbalta  was started at 60 mg daily with good efficacy and safety reported.  Seroquel  XR was continued 400 mg at bedtime to augment antidepressant effect and help with mood and sleep.  Atarax  was continued 3 times daily scheduled for anxiety in addition to as needed use.  Patient was continued on his HIV medications with no changes.  Patient's care was discussed during the interdisciplinary team meeting every day during the hospitalization.  The patient denied having side effects to prescribed psychiatric medication except what  was noted above. During hospital stay patient did report mainly stress related to homelessness and having no place to go to for that reason at 1 point he was unable to contract for safety outside the hospital several attempts to refer him to  inpatient residential rehab or sober living of [place] failed and he was declined from multiple places.  Unfortunately this is also contributed to by by him being a sex offender.  He does report 2 previous suicide attempt but when asked about details he describes suicide gesture of putting a bag on his head and putting a razor close to his neck in an attempt to seek help this attempt led to him going to emergency room and receiving care. During his hospital stay patient was noted to be interacting in the milieu presenting well in general with irritability triggered by discussing discharge plan with mania stress related to him being homeless.  Gradually, patient started adjusting to milieu. The patient was evaluated each day by a clinical provider to ascertain response to treatment. Improvement was noted by the patient's report of decreasing symptoms, improved sleep and appetite, affect, medication tolerance, behavior, and participation in unit programming.  Patient was asked each day to complete a self inventory noting mood, mental status, pain, new symptoms, anxiety and concerns.    Symptoms were reported as significantly decreased or resolved completely by discharge.   On day of discharge, patient was evaluated on 4/21 for discharge to [facility] on Tuesday morning 4/22 patient denies any passive or active SI intention or plan agrees with plan of discharge to [facility] and able to discuss with me crisis plan if recurring SI to report to emergency room or call 911.  The patient reports that their mood is stable in general except for stress related to being homeless. The patient denied having suicidal thoughts for more than 48 hours prior to discharge.  Patient denies having homicidal thoughts.  Patient denies having auditory hallucinations.  Patient denies any visual hallucinations or other symptoms of psychosis. The patient was motivated to continue taking medication with a goal of continued improvement in mental  health.  Discussed with patient importance to comply with medications after discharge as well as outpatient follow-up appointment for infectious disease and psychiatric medication management.  The patient reports their target psychiatric symptoms of depression and active SI responded well to the psychiatric medications, and the patient reports overall benefit other psychiatric hospitalization. Supportive psychotherapy was provided to the patient. The patient also participated in regular group therapy while hospitalized. Coping skills, problem solving as well as relaxation therapies were also part of the unit programming.  Labs were reviewed with the patient, and abnormal results were discussed with the patient.  The patient is able to verbalize their individual safety plan to this provider.  Addendum: Around 1 PM on 4/21 received a message from [facility] that the patient got accepted there to be transferred there on Tuesday morning at 8 AM.  Patient was evaluated on Monday 4/21 cleared for discharge with orders given for discharge on Tuesday morning 4/22 today [facility]  Behavioral Events: Patient had episodes of being irritable with the staff and disrespectful at times occasionally requiring as needed medications but these incidents were noted to be mainly behavioral and not related to any active psychosis.  Restraints: None  Groups: Attended  Medications Changes: As above  Sleep  Fair, improved during hospital stay  Musculoskeletal: Strength & Muscle Tone: within normal limits Gait & Station: normal Patient leans:  N/A  Psychiatric Specialty Exam  General Appearance: appears at stated age, fairly dressed and groomed  Behavior: pleasant and cooperative  Psychomotor Activity:No psychomotor agitation or retardation noted   Eye Contact: good Speech: normal amount, tone, volume and latency   Mood: euthymic Affect: congruent, pleasant and interactive  Thought Process:  linear, goal directed, no circumstantial or tangential thought process noted, no racing thoughts or flight of ideas Descriptions of Associations: intact Thought Content: Hallucinations: denies AH, VH , does not appear responding to stimuli Delusions: No paranoia or other delusions noted Suicidal Thoughts: denies SI, intention, plan  Homicidal Thoughts: denies HI, intention, plan   Alertness/Orientation: alert and fully oriented  Insight: fair, improved Judgment: fair, improved  Memory: intact  Executive Functions  Concentration: intact  Attention Span: Fair Recall: intact Fund of Knowledge: fair   Art therapist  Concentration: intact Attention Span: Fair Recall: intact Fund of Knowledge: fair   Assets  Assets: Manufacturing systems engineer   Physical Exam: Physical Exam ROS Blood pressure 127/87, pulse 99, temperature 98 F (36.7 C), resp. rate 17, height 5\' 2"  (1.575 m), weight 57.2 kg, SpO2 100%. Body mass index is 23.05 kg/m.  Mental Status Per Nursing Assessment::   On Admission:  Suicidal ideation indicated by patient  Demographic Factors:  Male, Gay, lesbian, or bisexual orientation, Low socioeconomic status, and Unemployed  Loss Factors: Decline in physical health  Historical Factors: Impulsivity  Risk Reduction Factors:   NA  Continued Clinical Symptoms: Symptoms improved during hospital stay Depression:   Anhedonia Impulsivity Alcohol/Substance Abuse/Dependencies  Cognitive Features That Contribute To Risk:  Thought constriction (tunnel vision)    Suicide Risk: Patient acute suicide risk is minor during evaluation on day of discharge, please note patient's chronic suicide risk remains moderate given ongoing medical problems/HIV, poor social support, homelessness and being a registered sex offender Minimal: No identifiable suicidal ideation.  Patients presenting with no risk factors but with morbid ruminations; may be classified as minimal risk  based on the severity of the depressive symptoms   Follow-up Information     [facility] Go on 09/15/2023.   Specialty: Professional Counselor Why: Please go to this provider on 09/15/23 at 9:00 am for therapy services.  You may also go Monday through Friday, from 9:00 am to 1:00 pm. Contact information: [address]  [address] [place] [address] [phone]         [facility]. Go on 09/19/2023.   Specialty: Behavioral Health Why: You have a hospital follow-up appointment with Dr. Blondie Burke on Monday, April 21st at [facility] information: [address] [place]  [address] [phone]        [facility] [place]. Go on 10/05/2023.   Specialty: Infectious Diseases Why: You have an appointment with your provider, Gibson Kurtz NP on Wednesday, May 7th at [facility] information: [address] [place], [address] [place]  [address] [phone]                Plan Of Care/Follow-up recommendations:   Discharge recommendations:    Activity: as tolerated  Diet: heart healthy  # It is recommended to the patient to continue psychiatric medications as prescribed, after discharge from the hospital.     # It is recommended to the patient to follow up with your outpatient psychiatric provider and PCP.   # It was discussed with the patient, the impact of alcohol, drugs,  tobacco have been there overall psychiatric and medical wellbeing, and total abstinence from substance use was recommended the patient.ed.   # Prescriptions provided or sent directly to preferred pharmacy at discharge. Patient agreeable to plan. Given opportunity to ask questions. Appears to feel comfortable with discharge.    # In the event of worsening symptoms, the patient is instructed to call the crisis hotline, 911 and or go to the nearest ED for appropriate evaluation  and treatment of symptoms. To follow-up with primary care provider for other medical issues, concerns and or health care needs   # Patient was discharged home with a plan to follow up as noted above.  -Follow-up with outpatient primary care doctor and other specialists -for management of chronic medical disease, including: Outpatient follow-up appointment was scheduled with infectious disease clinic, patient was recommended to follow, he agrees.  Patient was provided 30 days of his medications after his medications were filled from community pharmacy, to encourage compliance.  Patient agrees with D/C instructions and plan.  The patient received suicide prevention pamphlet:  Yes Belongings returned:  Clothing and Valuables  Total Time Spent in Direct Patient Care:  I personally spent 45 minutes on the unit in direct patient care. The direct patient care time included face-to-face time with the patient, reviewing the patient's chart, communicating with other professionals, and coordinating care. Greater than 50% of this time was spent in counseling or coordinating care with the patient regarding goals of hospitalization, psycho-education, and discharge planning needs.   Mertie Haslem 09/19/2023, 10:03 AM   Johnluke Haugen Linnie Riches, MD 09/19/2023, 10:03 AM

## 2023-09-19 NOTE — Group Note (Signed)
 Recreation Therapy Group Note   Group Topic:Personal Development  Group Date: 09/19/2023 Start Time: 1020 End Time: 1046 Facilitators: Delante Karapetyan-McCall, LRT,CTRS Location: [address] Dayroom   Group Topic: Personal Development  Goal Area(s) Addresses:  Patient will define what a trigger is. Patient will identify what triggers them. Patient will successfully identify how they can face their triggers.   Intervention: Worksheet  Activity: LRT and patients discussed what triggers were and how they can affect them. Patients were then given a worksheet were they identified the problems their triggers contribute to. Patients then identified triggers for different categories such as people, places, thoughts,etc. Patients would then identify their biggest triggers, how to avoid/reduce exposure to them and how they can face triggers head on.  Education: Geophysicist/field seismologist, Discharge Planning  Education Outcome: Acknowledges education, In group clarification offered, Needs additional education   Affect/Mood: Flat   Participation Level: Minimal   Participation Quality: Independent   Behavior: Distracted   Speech/Thought Process: Relevant   Insight: Moderate   Judgement: Moderate   Modes of Intervention: Worksheet   Patient Response to Interventions:  Receptive   Education Outcome:  In group clarification offered    Clinical Observations/Individualized Feedback: Pt answered the questions on the front of the worksheet. Pt was distracted and expressed frustration about being discharged but still not having anywhere to go. Pt identified stress and depression as problems caused by his triggers. Pt identified his childhood as and emotional trigger, old friends as people who trigger him, childhood house as the place that triggers him, drugs is also a trigger and thoughts about his past are triggers as well.     Plan: Continue to engage patient in RT group sessions  2-3x/week.   Swara Donze-McCall, LRT,CTRS  09/19/2023 1:38 PM

## 2023-09-19 NOTE — Discharge Summary (Addendum)
 Physician Discharge Summary Note  Patient:  Lee Bridges is an 33 y.o., male MRN:  [id number] DOB:  [birth year] Patient phone:  [phone] (home)  Patient address:   [address] [place] [address],  Total Time spent with patient: 45 minutes  Date of Admission:  09/06/2023 Date of Discharge: 09/20/23  Reason for Admission:  "33 year old male presents emergency department with complaints of auditory/visual sedation as well as suicidal ideation.  States he has been having symptoms for the past couple of weeks.  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  States that he had some of his behavioral health medications adjusted at that time with increasing his dose of Seroquel  and addition of another medication that he cannot exactly remember.  States that while he is in the hospital, began to see people that were not there as well as hear people talking that were not there.  States that the symptoms have become more regular ever since being discharged over the past week or 2.  States that the voices are telling him to harm himself.  States that earlier today, had a desire to "blow my brains out" or "stab myself."  Patient denies any direct plan.  Denies any access to a firearm.  States that he feels like this is related to his medication that he was started on.  Denies any visual disturbance, gait abnormality, slurred speech, facial droop, weakness with history of reasonable extremities.  Denies any substance use.  States that he does have a history of polysubstance use with most recent use 3 weeks ago.   Principal Problem: Substance-induced psychotic disorder [facility]) Discharge Diagnoses: Principal Problem:   Substance-induced psychotic disorder (HCC) Active Problems:   MDD (major depressive disorder), recurrent episode, moderate (HCC)   Past Psychiatric History:  See H&P Patient reports he had 2  previous suicide attempts but with further questioning he describes 1 incident when put plastic bag on his head while family with their so they took him to emergency room.  In the second incident he had a razor to his neck and told his sister so she took him to urgent care for help.  History of noncompliance with medications outside the hospital.  Past Medical History:  Past Medical History:  Diagnosis Date   Depression    HIV (human immunodeficiency virus infection) (HCC)    Psychosis (HCC)    Substance abuse (HCC)    History reviewed. No pertinent surgical history.  Family History:  Family History  Problem Relation Age of Onset   Arthritis Mother    Diabetes Father    Hypertension Father    Family Psychiatric  History:  Denies Social History:  Social History   Substance and Sexual Activity  Alcohol Use Not Currently   Comment: socially     Social History   Substance and Sexual Activity  Drug Use Yes   Types: Cocaine, Marijuana, Methamphetamines   Comment: weekly    Social History   Socioeconomic History   Marital status: Single    Spouse name: Not on file   Number of children: 0   Years of education: Not on file   Highest education level: Associate degree: academic program  Occupational History   Not on file  Tobacco Use   Smoking status: Every Day    Current packs/day: 0.50    Types: Cigarettes   Smokeless tobacco: Never   Tobacco comments:    1 PPD  Substance and Sexual Activity   Alcohol use: Not Currently    Comment: socially   Drug use: Yes    Types: Cocaine, Marijuana, Methamphetamines    Comment: weekly   Sexual activity: Not Currently  Other Topics Concern   Not on file  Social History Narrative   Homeless x 2 yrs.    Social Drivers of Corporate investment banker Strain: Not on file  Food Insecurity: Food Insecurity Present (09/06/2023)   Hunger Vital Sign    Worried About Running Out of Food in the Last Year: Often true    Ran Out of Food  in the Last Year: Often true  Transportation Needs: Unmet Transportation Needs (09/06/2023)   PRAPARE - Administrator, Civil Service (Medical): Yes    Lack of Transportation (Non-Medical): Yes  Physical Activity: Not on file  Stress: Not on file  Social Connections: Socially Isolated (08/09/2023)   Social Connection and Isolation Panel [NHANES]    Frequency of Communication with Friends and Family: Never    Frequency of Social Gatherings with Friends and Family: Never    Attends Religious Services: Never    Database administrator or Organizations: No    Attends Engineer, structural: Never    Marital Status: Never married   Homeless, registered sex offender, he denies having pending legal charges or court dates.  Limited social support  Substance Use History:  Significant history of cocaine and marijuana use with frequent use, history of rehab treatment in the past and multiple relapses.  Hospital Course:   During the patient's hospitalization, patient had extensive initial psychiatric evaluation, and follow-up psychiatric evaluations every day.   Psychiatric diagnoses provided upon initial assessment: Principal Problem:   Substance-induced psychotic disorder (HCC) Active Problems:   MDD (major depressive disorder), recurrent episode, moderate (HCC)   Patient's psychiatric medications were adjusted on admission: Seroquel  XR was started 400 mg at bedtime for mood and sleep, Trintellix  was restarted 10 mg daily for depression   During the hospitalization, other adjustments were made to the patient's psychiatric medication regimen: Trintellix  was titrated to 20 mg daily for depression but later discontinued secondary to lack of coverage with insurance, Effexor  was started for depression but patient reported side effect of dry mouth and decreased appetite Effexor  was discontinued and Cymbalta  was started at 60 mg daily with good efficacy and safety reported.  Seroquel  XR  was continued 400 mg at bedtime to augment antidepressant effect and help with mood and sleep.  Atarax  was continued 3 times daily scheduled for anxiety in addition to as needed use.  Patient was continued on his HIV medications with no changes.   Patient's care was discussed during the interdisciplinary team meeting every day during the hospitalization.   The patient denied having side effects to prescribed psychiatric medication except what was noted above. During hospital stay patient did report mainly stress related to homelessness and having no place to go to for that reason at 1 point he was unable to contract for safety outside the hospital several attempts to refer him to inpatient residential rehab or sober living of [place] failed and he was declined from multiple places.  Unfortunately this is also contributed to by by him being a sex offender.  He does report 2 previous suicide attempt but when asked about details he describes suicide gesture of putting a bag on his head and putting a razor close to his neck in an attempt to seek help this  attempt led to him going to emergency room and receiving care. During his hospital stay patient was noted to be interacting in the milieu presenting well in general with irritability triggered by discussing discharge plan with mania stress related to him being homeless.   Gradually, patient started adjusting to milieu. The patient was evaluated each day by a clinical provider to ascertain response to treatment. Improvement was noted by the patient's report of decreasing symptoms, improved sleep and appetite, affect, medication tolerance, behavior, and participation in unit programming.  Patient was asked each day to complete a self inventory noting mood, mental status, pain, new symptoms, anxiety and concerns.     Symptoms were reported as significantly decreased or resolved completely by discharge.    On day of discharge, patient was evaluated on 4/21 for  discharge Tuesday morning today [facility] inpatient rehab facility, patient denies any passive or active SI intention or plan agrees with plan of discharge to [facility] and able to discuss with me crisis plan if recurring SI to report to emergency room or call 911.  The patient reports that their mood is stable in general except for stress related to being homeless. The patient denied having suicidal thoughts for more than 48 hours prior to discharge.  Patient denies having homicidal thoughts.  Patient denies having auditory hallucinations.  Patient denies any visual hallucinations or other symptoms of psychosis. The patient was motivated to continue taking medication with a goal of continued improvement in mental health.  Discussed with patient importance to comply with medications after discharge as well as outpatient follow-up appointment for infectious disease and psychiatric medication management.   The patient reports their target psychiatric symptoms of depression and active SI responded well to the psychiatric medications, and the patient reports overall benefit other psychiatric hospitalization. Supportive psychotherapy was provided to the patient. The patient also participated in regular group therapy while hospitalized. Coping skills, problem solving as well as relaxation therapies were also part of the unit programming.   Labs were reviewed with the patient, and abnormal results were discussed with the patient.   The patient is able to verbalize their individual safety plan to this provider.   Addendum: Around 1 PM on 4/21 received a message from [facility] that the patient got accepted there to be transferred there on Tuesday morning at 8 AM.  Patient was evaluated on Monday 4/21 cleared for discharge with orders given for discharge on Tuesday morning 4/22 today [facility]   Behavioral Events: Patient had episodes of being irritable with the staff and disrespectful at times occasionally requiring  as needed medications but these incidents were noted to be mainly behavioral and not related to any active psychosis.   Restraints: None   Groups: Attended   Medications Changes: As above   Sleep  Fair, improved during hospital stay  Physical Findings: AIMS: Facial and Oral Movements Muscles of Facial Expression: None Lips and Perioral Area: None Jaw: None Tongue: None,Extremity Movements Upper (arms, wrists, hands, fingers): None Lower (legs, knees, ankles, toes): None, Trunk Movements Neck, shoulders, hips: None, Global Judgements Severity of abnormal movements overall : None Incapacitation due to abnormal movements: None Patient's awareness of abnormal movements: No Awareness, Dental Status Current problems with teeth and/or dentures?: No Does patient usually wear dentures?: No Edentia?: No  CIWA:    COWS:  COWS Total Score: 1  Musculoskeletal: Strength & Muscle Tone: within normal limits Gait & Station: normal Patient leans: N/A   Psychiatric Specialty Exam:  General  Appearance: appears at stated age, fairly dressed and groomed  Behavior: pleasant and cooperative  Psychomotor Activity:No psychomotor agitation or retardation noted   Eye Contact: good Speech: normal amount, tone, volume and latency   Mood: euthymic Affect: congruent, pleasant and interactive  Thought Process: linear, goal directed, no circumstantial or tangential thought process noted, no racing thoughts or flight of ideas Descriptions of Associations: intact Thought Content: Hallucinations: denies AH, VH , does not appear responding to stimuli Delusions: No paranoia or other delusions noted Suicidal Thoughts: denies SI, intention, plan  Homicidal Thoughts: denies HI, intention, plan   Alertness/Orientation: alert and fully oriented  Insight: fair, improved Judgment: fair, improved  Memory: intact  Executive Functions  Concentration: intact  Attention Span: Fair Recall:  intact Fund of Knowledge: fair   Assets  Assets: Communication Skills     Physical Exam:  Physical Exam Vitals and nursing note reviewed.  Constitutional:      Appearance: Normal appearance. He is normal weight.  HENT:     Head: Normocephalic and atraumatic.     Nose: Nose normal.  Eyes:     Extraocular Movements: Extraocular movements intact.  Pulmonary:     Effort: Pulmonary effort is normal.  Musculoskeletal:        General: Normal range of motion.     Cervical back: Normal range of motion.  Neurological:     General: No focal deficit present.     Mental Status: He is alert and oriented to person, place, and time. Mental status is at baseline.    Review of Systems  All other systems reviewed and are negative.  Blood pressure 127/87, pulse 99, temperature 98 F (36.7 C), resp. rate 17, height 5\' 2"  (1.575 m), weight 57.2 kg, SpO2 100%. Body mass index is 23.05 kg/m.   Social History   Tobacco Use  Smoking Status Every Day   Current packs/day: 0.50   Types: Cigarettes  Smokeless Tobacco Never  Tobacco Comments   1 PPD   Tobacco Cessation:  A prescription for an FDA-approved tobacco cessation medication was offered at discharge and the patient refused   Blood Alcohol level:  Lab Results  Component Value Date   [facility] <10 09/05/2023   ETH <10 08/08/2023    Metabolic Disorder Labs:  Lab Results  Component Value Date   HGBA1C 4.6 (L) 08/12/2023   MPG 85.32 08/12/2023   No results found for: "PROLACTIN" Lab Results  Component Value Date   CHOL 167 08/12/2023   TRIG 239 (H) 08/12/2023   HDL 30 (L) 08/12/2023   CHOLHDL 5.6 08/12/2023   VLDL 48 (H) 08/12/2023   LDLCALC 89 08/12/2023    See Psychiatric Specialty Exam and Suicide Risk Assessment completed by Attending Physician prior to discharge.  Discharge destination: [facility] residential treatment  Is patient on multiple antipsychotic therapies at discharge:  No   Has Patient had three or more  failed trials of antipsychotic monotherapy by history:  No  Recommended Plan for Multiple Antipsychotic Therapies: NA  Discharge Instructions     Diet - low sodium heart healthy   Complete by: As directed    Increase activity slowly   Complete by: As directed       Allergies as of 09/19/2023       Reactions   Trazodone  And Nefazodone    severe   Fish Allergy Hives, Nausea And Vomiting   Shellfish Allergy Hives, Nausea And Vomiting        Medication List  STOP taking these medications    sertraline  50 MG tablet Commonly known as: ZOLOFT        TAKE these medications      Indication  atovaquone  750 MG/5ML suspension Commonly known as: MEPRON  Take 10 mLs (1,500 mg total) by mouth daily with breakfast.  Indication: HIV   Biktarvy  50-200-25 MG Tabs tablet Generic drug: bictegravir-emtricitabine -tenofovir  AF Take 1 tablet by mouth daily.  Indication: HIV Disease   DULoxetine  60 MG capsule Commonly known as: CYMBALTA  Take 1 capsule (60 mg total) by mouth daily. Start taking on: September 20, 2023  Indication: Major Depressive Disorder   hydrOXYzine  25 MG tablet Commonly known as: ATARAX  Take 1 tablet (25 mg total) by mouth 3 (three) times daily as needed for anxiety.  Indication: Feeling Anxious   hydrOXYzine  25 MG tablet Commonly known as: ATARAX  Take 1 tablet (25 mg total) by mouth 3 (three) times daily.  Indication: Feeling Anxious   methocarbamol  500 MG tablet Commonly known as: ROBAXIN  Take 1 tablet (500 mg total) by mouth every 8 (eight) hours as needed for muscle spasms. What changed: when to take this  Indication: Musculoskeletal Pain   nicotine  14 mg/24hr patch Commonly known as: NICODERM CQ  - dosed in mg/24 hours Place 1 patch (14 mg total) onto the skin daily.  Indication: Nicotine  Addiction   QUEtiapine  400 MG tablet Commonly known as: SEROquel  Take 1 tablet (400 mg total) by mouth at bedtime. What changed:  medication strength how  much to take  Indication: Major Depressive Disorder   vortioxetine  HBr 20 MG Tabs tablet Commonly known as: TRINTELLIX  Take 1 tablet (20 mg total) by mouth daily.  Indication: Major Depressive Disorder        Follow-up Information     [place] Go on 09/15/2023.   Specialty: Professional Counselor Why: Please go to this provider on 09/15/23 at 9:00 am for therapy services.  You may also go Monday through Friday, from 9:00 am to 1:00 pm. Contact information: [address]  [address] [place] [address] [phone]         [facility]. Go on 09/19/2023.   Specialty: Behavioral Health Why: You have a hospital follow-up appointment with Dr. Blondie Burke on Monday, April 21st at [facility] information: [phone] [place]  [address] [phone]        [facility] [place]. Go on 10/05/2023.   Specialty: Infectious Diseases Why: You have an appointment with your provider, Gibson Kurtz NP on Wednesday, May 7th at [facility] information: [address] [place], [address] [place]  [address] [phone]                Discharge recommendations:    Activity: as tolerated  Diet: heart healthy  # It is recommended to the patient to continue psychiatric medications as prescribed, after discharge from the hospital.     # It is recommended to the patient to follow up with your outpatient psychiatric provider and PCP.   # It was discussed with the patient, the impact of alcohol, drugs, tobacco have been there overall psychiatric and medical wellbeing, and total abstinence from substance use was recommended the patient.ed.   # Prescriptions provided or sent directly to preferred pharmacy at discharge. Patient agreeable to plan. Given opportunity to ask questions. Appears to feel comfortable with discharge.    # In the event of worsening  symptoms, the patient  is instructed to call the crisis hotline, 911 and or go to the nearest ED for appropriate evaluation and treatment of symptoms. To follow-up with primary care provider for other medical issues, concerns and or health care needs   # Patient was discharged home with a plan to follow up as noted above.  -Follow-up with outpatient primary care doctor and other specialists -for management of chronic medical disease, including: Outpatient follow-up appointment was scheduled with infectious disease clinic, patient was recommended to follow, he agrees.   Patient was provided 30 days of his medications after his medications were filled from community pharmacy, to encourage compliance.   Patient agrees with D/C instructions and plan.   The patient received suicide prevention pamphlet:  Yes Belongings returned:  Clothing and Valuables  Total Time Spent in Direct Patient Care:  I personally spent 45 minutes on the unit in direct patient care. The direct patient care time included face-to-face time with the patient, reviewing the patient's chart, communicating with other professionals, and coordinating care. Greater than 50% of this time was spent in counseling or coordinating care with the patient regarding goals of hospitalization, psycho-education, and discharge planning needs.    SignedAlver Jobs, MD 09/19/2023, 10:16 AM

## 2023-09-19 NOTE — Group Note (Signed)
 LCSW Group Therapy Note  Group Date: 09/19/2023 Start Time: 1300 End Time: 1400   Type of Therapy:  Group Therapy  Topic:  Coping Skills for Depression and Anxiety  Due to the acuity and complex discharge plans, group was not held. Patient was provided therapeutic worksheets and asked to meet with CSW as needed.   Trany Chernick O Laken Lobato, LCSWA 09/19/2023  3:47 PM

## 2023-09-19 NOTE — Progress Notes (Signed)
   09/19/23 1450  Spiritual Encounters  Type of Visit Follow up  Care provided to: Patient  Referral source Chaplain assessment  Reason for visit Routine spiritual support   While on unit I saw that Mr. Perazzo had not yet discharged , so I inquired about how he was doing and his plans.  Mr. Reetz shared that he had been accepted to [facility]. He expressed feeling that he had received an answered prayer and inspite of uncertainty he was feeling hopeful. He engaged in some life review and reflected on choices that did not serve him well, reiterated coping skills he has learned, and desire for a fresh start. He cited his faith as significant in how he makes meaning and finds strength.  I provided supportive presence and affirmed his potential. We continued to build relationship. At patient's request I offered prayer. I wished him well going forward.  Zerina Hallinan L. Minetta Aly, M.Div [phone]

## 2023-09-19 NOTE — Progress Notes (Signed)
  Lee Bridges   Type of Note: [facility]   Received call back from [place] with [facility]. Patient was accepted to their facility and will admit there tomorrow by 9:00AM. Discharge to be completed this afternoon.  Tx team updated.  Signed:  Akhila Mahnken, LCSW-A 09/19/2023  12:49 PM

## 2023-09-19 NOTE — Progress Notes (Signed)
 Pt appeared bright and calm, readily engaging in discussion.   Pt shared that his father was a Education officer, environmental and his family was the choir. He shared that he can sing and play instruments illustrating with a few stories.  Pt denied SI/HI/AVH. Continued to c/o pain in left shoulder/arm and received prn meds (see MAR).   09/18/23 2100  Psych Admission Type (Psych Patients Only)  Admission Status Voluntary  Psychosocial Assessment  Patient Complaints Anxiety;Depression  Eye Contact Fair  Facial Expression Animated  Affect Appropriate to circumstance  Speech Slow  Interaction Assertive;Other (Comment) (talkative)  Appearance/Hygiene Unremarkable;In scrubs  Behavior Characteristics Cooperative;Appropriate to situation;Calm  Mood Anxious  Thought Process  Coherency WDL  Content WDL  Delusions None reported or observed  Perception WDL  Hallucination None reported or observed  Judgment Limited  Confusion None  Danger to Self  Current suicidal ideation? Denies  Self-Injurious Behavior No self-injurious ideation or behavior indicators observed or expressed   Agreement Not to Harm Self Yes  Description of Agreement Verbal  Danger to Others  Danger to Others None reported or observed  Danger to Others Abnormal  Harmful Behavior to others No threats or harm toward other people  Destructive Behavior No threats or harm toward property

## 2023-09-19 NOTE — Progress Notes (Addendum)
 [facility] MD Progress Note  09/19/2023 8:23 AM Lee Bridges  MRN:  [id number]  Per MAR review patient is compliant with medications on the unit PRN Robaxin  being used average once to twice daily, as needed Atarax  used average once daily for anxiety.  Chart review indicates patient is compliant with meds on the unit, prn atarax  being used daily for anxiety, last as needed agitation protocol was given on 4/19 in the evening.  Subjective:   Upon assessment today patient reports had a good day yesterday, reports feeling good in general this morning.  Reports better depression in general as well as better anxiety.  He was started on Cymbalta  couple days ago and he denies side effect to current medication regimen.  He reports fair sleep and appetite.  He continues to deny passive or active SI intention or plan, denies HI or AVH.  He continues to report feeling unsafe if discharged given he is homeless and has no place to go, he continues to be hopeful to be able to go to inpatient residential rehab program, we are still awaiting response from [facility] expected this morning.  He also attempted to contact [facility] yesterday and left him a voicemail and he plans to call them again this morning, will follow.  Addendum: I came back to reassess the patient, he reports he spoke with [facility] house and was denied from there because he is a sex offender.  He is requesting discharge and notes after he leaves here he agrees to go to [facility], he also able to discuss crisis plan if recurring SI after discharge to go to emergency room or urgent care.  During my evaluation he continues to deny passive or active SI intention or plan.  I informed him that he has follow-up appointments with infectious disease as well as for psychiatric medication management.  Addendum: Around 1 PM on 4/21 received a message from [facility] that the patient got accepted there to be transferred there on Tuesday morning at 8 AM.  Patient was evaluated  on Monday 4/21 cleared for discharge with orders given for discharge on Tuesday morning 4/22 today [facility]    Principal Problem: Substance-induced psychotic disorder [facility]) Diagnosis: Principal Problem:   Substance-induced psychotic disorder (HCC) Active Problems:   MDD (major depressive disorder), recurrent episode, moderate (HCC)  Total Time spent with patient: 30 minutes  Past Psychiatric History: See H&P  Past Medical History:  Past Medical History:  Diagnosis Date   Depression    HIV (human immunodeficiency virus infection) (HCC)    Psychosis (HCC)    Substance abuse (HCC)    History reviewed. No pertinent surgical history. Family History:  Family History  Problem Relation Age of Onset   Arthritis Mother    Diabetes Father    Hypertension Father    Family Psychiatric  History: denies Social History:  Social History   Substance and Sexual Activity  Alcohol Use Not Currently   Comment: socially     Social History   Substance and Sexual Activity  Drug Use Yes   Types: Cocaine, Marijuana, Methamphetamines   Comment: weekly    Social History   Socioeconomic History   Marital status: Single    Spouse name: Not on file   Number of children: 0   Years of education: Not on file   Highest education level: Associate degree: academic program  Occupational History   Not on file  Tobacco Use   Smoking status: Every Day    Current  packs/day: 0.50    Types: Cigarettes   Smokeless tobacco: Never   Tobacco comments:    1 PPD  Substance and Sexual Activity   Alcohol use: Not Currently    Comment: socially   Drug use: Yes    Types: Cocaine, Marijuana, Methamphetamines    Comment: weekly   Sexual activity: Not Currently  Other Topics Concern   Not on file  Social History Narrative   Homeless x 2 yrs.    Social Drivers of Corporate investment banker Strain: Not on file  Food Insecurity: Food Insecurity Present (09/06/2023)   Hunger Vital Sign     Worried About Running Out of Food in the Last Year: Often true    Ran Out of Food in the Last Year: Often true  Transportation Needs: Unmet Transportation Needs (09/06/2023)   PRAPARE - Administrator, Civil Service (Medical): Yes    Lack of Transportation (Non-Medical): Yes  Physical Activity: Not on file  Stress: Not on file  Social Connections: Socially Isolated (08/09/2023)   Social Connection and Isolation Panel [NHANES]    Frequency of Communication with Friends and Family: Never    Frequency of Social Gatherings with Friends and Family: Never    Attends Religious Services: Never    Database administrator or Organizations: No    Attends Engineer, structural: Never    Marital Status: Never married   Additional Social History:                         Sleep: Fair  Appetite:  Fair  Current Medications: Current Facility-Administered Medications  Medication Dose Route Frequency Provider Last Rate Last Admin   acetaminophen  (TYLENOL ) tablet 650 mg  650 mg Oral Q6H PRN Roise Cleaver, NP   650 mg at 09/15/23 0912   alum & mag hydroxide-simeth (MAALOX/MYLANTA) 200-200-20 MG/5ML suspension 30 mL  30 mL Oral Q4H PRN Roise Cleaver, NP   30 mL at 09/09/23 1114   atovaquone  (MEPRON ) 750 MG/5ML suspension 1,500 mg  1,500 mg Oral Q breakfast Zouev, Dmitri, MD   1,500 mg at 09/19/23 0981   bictegravir-emtricitabine -tenofovir  AF (BIKTARVY ) 50-200-25 MG per tablet 1 tablet  1 tablet Oral Daily Orson Blalock, NP   1 tablet at 09/19/23 0821   haloperidol  (HALDOL ) tablet 5 mg  5 mg Oral TID PRN Roise Cleaver, NP   5 mg at 09/17/23 2028   And   diphenhydrAMINE  (BENADRYL ) capsule 50 mg  50 mg Oral TID PRN Roise Cleaver, NP   50 mg at 09/17/23 2028   haloperidol  lactate (HALDOL ) injection 5 mg  5 mg Intramuscular TID PRN Roise Cleaver, NP       And   diphenhydrAMINE  (BENADRYL ) injection 50 mg  50 mg Intramuscular TID PRN Roise Cleaver, NP       And    LORazepam  (ATIVAN ) injection 2 mg  2 mg Intramuscular TID PRN Roise Cleaver, NP       haloperidol  lactate (HALDOL ) injection 10 mg  10 mg Intramuscular TID PRN Roise Cleaver, NP       And   diphenhydrAMINE  (BENADRYL ) injection 50 mg  50 mg Intramuscular TID PRN Roise Cleaver, NP       And   LORazepam  (ATIVAN ) injection 2 mg  2 mg Intramuscular TID PRN Roise Cleaver, NP       diphenhydrAMINE -zinc  acetate (BENADRYL ) 2-0.1 % cream   Topical BID PRN Ajibola, Ene A, NP  DULoxetine  (CYMBALTA ) DR capsule 60 mg  60 mg Oral Daily Denecia Brunette, MD   60 mg at 09/19/23 0821   hydrOXYzine  (ATARAX ) tablet 25 mg  25 mg Oral TID PRN Alver Jobs, MD   25 mg at 09/16/23 2126   hydrOXYzine  (ATARAX ) tablet 25 mg  25 mg Oral TID Alver Jobs, MD   25 mg at 09/19/23 0821   magnesium  hydroxide (MILK OF MAGNESIA) suspension 30 mL  30 mL Oral Daily PRN Roise Cleaver, NP       methocarbamol  (ROBAXIN ) tablet 500 mg  500 mg Oral Q6H PRN Zouev, Dmitri, MD   500 mg at 09/19/23 8657   nicotine  (NICODERM CQ  - dosed in mg/24 hours) patch 14 mg  14 mg Transdermal Daily Zouev, Dmitri, MD   14 mg at 09/19/23 8469   nystatin  cream (MYCOSTATIN )   Topical BID Zouev, Dmitri, MD   1 Application at 09/19/23 6295   QUEtiapine  (SEROQUEL  XR) 24 hr tablet 400 mg  400 mg Oral QHS Zouev, Dmitri, MD   400 mg at 09/18/23 2021    Lab Results:  No results found for this or any previous visit (from the past 48 hours).    Blood Alcohol level:  Lab Results  Component Value Date   ETH <10 09/05/2023   ETH <10 08/08/2023    Metabolic Disorder Labs: Lab Results  Component Value Date   HGBA1C 4.6 (L) 08/12/2023   MPG 85.32 08/12/2023   No results found for: "PROLACTIN" Lab Results  Component Value Date   CHOL 167 08/12/2023   TRIG 239 (H) 08/12/2023   HDL 30 (L) 08/12/2023   CHOLHDL 5.6 08/12/2023   VLDL 48 (H) 08/12/2023   LDLCALC 89 08/12/2023     Musculoskeletal: Strength & Muscle Tone: within  normal limits Gait & Station: normal Patient leans: N/A  Psychiatric Specialty Exam:  Presentation  General Appearance:  Appropriate for Environment  Eye Contact: Fair  Speech: Clear and Coherent  Speech Volume: Normal  Handedness: Right   Mood and Affect  Mood: Dysphoric, calm  Affect: Congruent, restricted sad affect   Thought Process  Thought Processes: Coherent  Descriptions of Associations:Intact  Orientation:Full (Time, Place and Person)  Thought Content:Logical  History of Schizophrenia/Schizoaffective disorder:No  Duration of Psychotic Symptoms:Greater than six months  Hallucinations:patient reports AH of multiple voices noncommand in nature but has not been observed to be RTIS  Ideas of Reference:None  Suicidal Thoughts: Denies passive or active SI intention or plan, able to discuss crisis plan if recurring SI after discharge  Homicidal Thoughts:denies HI   Sensorium  Memory: Immediate Fair  Judgment: Limited  Insight: Limited   Executive Functions  Concentration: Fair  Attention Span: Fair  Recall: [employer] of Knowledge: Fair  Language: Fair   Psychomotor Activity  Psychomotor Activity: No data recorded   Assets  Assets: Communication Skills   Sleep  Sleep: fair    Physical Exam: Physical exam: Please see exam on admit note. General: Well developed, well nourished.  Pupils: Normal at 3mm Respiratory: Breathing is unlabored.  Cardiovascular: No edema.  Language: No anomia, no aphasia Muscle strength and tone-pt moving all extremities.  Gait not assessed as pt remained in bed.  Neuro: Facial muscles are symmetric. Pt without tremor, no evidence of hyperarousal.  Review of Systems  Constitutional: Negative.   HENT: Negative.    Eyes: Negative.   Respiratory: Negative.    Cardiovascular: Negative.   Gastrointestinal: Negative.   Genitourinary: Negative.  Musculoskeletal: Negative.   Skin:  Negative.   Neurological: Negative.  Tremors: discontinue effexorstart cymbalta  trial 60 mg daily for depression.  Endo/Heme/Allergies: Negative.   Psychiatric/Behavioral: Negative.    All other systems reviewed and are negative.  Blood pressure 127/87, pulse 99, temperature 98 F (36.7 C), resp. rate 17, height 5\' 2"  (1.575 m), weight 57.2 kg, SpO2 100%. Body mass index is 23.05 kg/m.   Treatment Plan Summary:   Daily contact with patient to assess and evaluate symptoms and progress in treatment, Medication management, and Plan:   - Continue Seroquel  XR 400  mg at bedtime for mood and depression, also helps with sleep and anxiety   Continue trial cymbalta  60 mg daily, was started on 4/20 for depression and monitor efficacy and safety Continue vistaril  25 mg tid scheduled in addition to Prn for anxiety  Will follow regarding referral to [facility] for inpatient rehab treatment or [facility].   Will have patient scheduled for follow-up with infectious disease after discharge as well as primary care provider.  Will also have patient referred for outpatient treatment including medication management and counseling.  Noted to patient that he has access to transportation services through [facility] to help him comply with outpatient follow-up scheduled.  Observation Level/Precautions:  15 minute checks  Laboratory:  CBC Chemistry Profile UDS UA Vitamin B-12  Psychotherapy:  will attend group therapy  Medications:  Seroquel  and Trintellix   Consultations:  curbside consult with ID (ordered CD4 counts as well as HIV testing)  Discharge Concerns: Will continue to attempt referral to sober living or rehab treatment, will need outpatient follow-up with infectious disease provider for HIV  Estimated LOS:3-5 days  Other:       Medical Consult: curbside consult with outpatient provider Patient had CD4 drawn but not RPR or HIV-RNA so I reordered labs and will followup Vital signs  stable  Reached out to [facility] regarding patient's lab results and will followup: CD4:258, RPR (reactive, titer 1:32); The HIV RNA lab looks like is still pending  See noted from 4/10 with following recs: "In review of his chart and records I Kyian't feel strongly he needs an additional dose of the bicillin  as his infection appears to be early between July 2024 and October 2024.    I will repeat a RPR, CD4 and HIV RNA level (last one was 196,000 copies in February).    Continue biktarvy  once daily, regarding pneumocystis prophylaxis he can continue either Bactrim  1 SS daily or Atovaquone  1500 mg daily. 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 That was a bit of an overkill recommendation - which is OK to be over cautious sometimes.  His CD4 is better - I would say he should continue the atovaquone  another 3 months then we can stop it IF he stays on his Biktarvy  more consistently - which he needs to continue of course lifelong.  I will take a peak at the viral load Monday "     Physician Treatment Plan for Primary Diagnosis: Substance-induced psychotic disorder [facility]) Long Term Goal(s): Improvement in symptoms so as ready for discharge   Short Term Goals: Ability to identify changes in lifestyle to reduce recurrence of condition will improve, Ability to  verbalize feelings will improve, Ability to disclose and discuss suicidal ideas, Ability to demonstrate self-control will improve, Ability to identify and develop effective coping behaviors will improve, Ability to maintain clinical measurements within normal limits will improve, Compliance with prescribed medications will improve, and Ability to identify triggers associated with  substance abuse/mental health issues will improve   Physician Treatment Plan for Secondary Diagnosis: Principal Problem:   Substance-induced psychotic disorder (HCC) Active Problems:   MDD (major depressive disorder), recurrent episode, moderate (HCC)   Long Term Goal(s): Improvement in symptoms so as ready for discharge   Short Term Goals: Ability to identify changes in lifestyle to reduce recurrence of condition will improve, Ability to verbalize feelings will improve, Ability to disclose and discuss suicidal ideas, Ability to demonstrate self-control will improve, Ability to identify and develop effective coping behaviors will improve, Ability to maintain clinical measurements within normal limits will improve, and Compliance with prescribed medications will improve   I certify that inpatient services furnished can reasonably be expected to improve the patient's condition.     Refugio Vandevoorde Linnie Riches, MD 09/19/2023, 8:23 AM

## 2023-09-19 NOTE — Progress Notes (Signed)
 09/19/2023  Lee Bridges DOB: 07/06/1990 MRN: [id number]   RIDER WAIVER AND RELEASE OF LIABILITY  For the purposes of helping with transportation needs, [facility] partners with outside transportation providers (taxi companies, [place], Catering manager.) to give [facility] patients or other approved people the choice of on-demand rides [employer]") to our buildings for non-emergency visits.  By using [employer], I, the person signing this document, on behalf of myself and/or any legal minors (in my care using the [employer]), agree:  Science writer given to me are supplied by independent, outside transportation providers who do not work for, or have any affiliation with, [employer]. [facility] is not a transportation company. [facility] has no control over the quality or safety of the rides I get using [employer]. [facility] has no control over whether any outside ride will happen on time or not. [facility] gives no guarantee on the reliability, quality, safety, or availability on any rides, or that no mistakes will happen. I know and accept that traveling by vehicle (car, truck, SVU, [place], bus, taxi, etc.) has risks of serious injuries such as disability, being paralyzed, and death. I know and agree the risk of using [employer] is mine alone, and not [employer]. Transport Services are provided "as is" and as are available. The transportation providers are in charge for all inspections and care of the vehicles used to provide these rides. I agree not to take legal action against [employer], its agents, employees, officers, directors, representatives, insurers, attorneys, assigns, successors, subsidiaries, and affiliates at any time for any reasons related directly or indirectly to using [employer]. I also agree not to take legal action against [place] or its affiliates for any injury, death, or damage to property caused by or related to using  [employer]. I have read this Waiver and Release of Liability, and I understand the terms used in it and their legal meaning. This Waiver is freely and voluntarily given with the understanding that my right (or any legal minors) to legal action against [place] relating to [employer] is knowingly given up to use these services.   I attest that I read the Ride Waiver and Release of Liability to Lee Bridges, gave Mr. Dorfman the opportunity to ask questions and answered the questions asked (if any). I affirm that Lee Bridges then provided consent for assistance with transportation.

## 2023-09-20 ENCOUNTER — Ambulatory Visit (HOSPITAL_COMMUNITY): Admitting: Psychiatry

## 2023-09-20 NOTE — Plan of Care (Signed)
  Problem: Education: Goal: Mental status will improve Outcome: Adequate for Discharge   Problem: Activity: Goal: Interest or engagement in activities will improve Outcome: Adequate for Discharge Goal: Sleeping patterns will improve Outcome: Adequate for Discharge   Problem: Coping: Goal: Ability to verbalize frustrations and anger appropriately will improve Outcome: Adequate for Discharge Goal: Ability to demonstrate self-control will improve Outcome: Adequate for Discharge   Problem: Health Behavior/Discharge Planning: Goal: Identification of resources available to assist in meeting health care needs will improve Outcome: Adequate for Discharge Goal: Compliance with treatment plan for underlying cause of condition will improve Outcome: Adequate for Discharge   Problem: Physical Regulation: Goal: Ability to maintain clinical measurements within normal limits will improve Outcome: Adequate for Discharge   Problem: Safety: Goal: Periods of time without injury will increase Outcome: Adequate for Discharge

## 2023-09-20 NOTE — Progress Notes (Signed)
   09/19/23 2200  Psych Admission Type (Psych Patients Only)  Admission Status Voluntary  Psychosocial Assessment  Patient Complaints Depression  Eye Contact Fair  Facial Expression Animated  Affect Appropriate to circumstance  Speech Slow  Interaction Assertive  Appearance/Hygiene Unremarkable  Behavior Characteristics Cooperative;Calm;Appropriate to situation  Mood Euthymic;Pleasant;Other (Comment) (Pt shared he is looking forward to discharge to [facility] and is in "good spirits." He rated depression 2/10 and denied anxiety. Denied SI/HI/AVH. Joined last AA group last night without incident on adjacent unit.)  Thought Process  Coherency WDL  Content WDL  Delusions None reported or observed  Perception WDL  Hallucination None reported or observed  Judgment WDL  Confusion None  Danger to Self  Current suicidal ideation? Denies  Self-Injurious Behavior No self-injurious ideation or behavior indicators observed or expressed   Agreement Not to Harm Self Yes  Description of Agreement Verbal  Danger to Others  Danger to Others None reported or observed  Danger to Others Abnormal  Harmful Behavior to others No threats or harm toward other people  Destructive Behavior No threats or harm toward property

## 2023-09-20 NOTE — Progress Notes (Signed)
 Pt discharged to lobby. Pt was stable and appreciative at that time. All papers, samples and prescriptions were given and valuables returned. Verbal understanding expressed. Denies SI/HI and A/VH. Pt given opportunity to express concerns and ask questions.

## 2023-09-21 ENCOUNTER — Telehealth: Payer: Self-pay

## 2023-09-21 NOTE — Telephone Encounter (Signed)
 Received call from [facility] with [facility] inquiring if they should be following any precautions regarding patient's status. Would also like to ask provider if patient would be okay being in a community setting.  Per provider staff should follow universal precautions, ensure patient is receiving Biktarvy / Atovaquone , refrain from sexual activity, and avoid raw food.  Confirmed that there should be no issues for patient to get medication daily. No additional questions at this time. Julien Odor, RMA

## 2023-10-05 ENCOUNTER — Encounter: Payer: Self-pay | Admitting: Infectious Diseases

## 2023-10-05 ENCOUNTER — Ambulatory Visit (INDEPENDENT_AMBULATORY_CARE_PROVIDER_SITE_OTHER): Payer: MEDICAID | Admitting: Infectious Diseases

## 2023-10-05 ENCOUNTER — Other Ambulatory Visit: Payer: Self-pay

## 2023-10-05 VITALS — BP 118/79 | HR 107 | Temp 99.4°F | Ht 62.0 in | Wt 148.0 lb

## 2023-10-05 DIAGNOSIS — B20 Human immunodeficiency virus [HIV] disease: Secondary | ICD-10-CM | POA: Diagnosis not present

## 2023-10-05 DIAGNOSIS — F1991 Other psychoactive substance use, unspecified, in remission: Secondary | ICD-10-CM | POA: Diagnosis not present

## 2023-10-05 DIAGNOSIS — Z21 Asymptomatic human immunodeficiency virus [HIV] infection status: Secondary | ICD-10-CM

## 2023-10-05 NOTE — Progress Notes (Signed)
 Name: Lee Bridges  DOB: 03-07-1991 MRN: [id number] PCP: Pcp, No    Brief Narrative:  Lee Bridges is a 33 y.o. male with HIV disease, Dx 10/17/2014. Never on medications.  CD4 nadir 180 VL unknown HIV Risk: sexual  History of OIs: none Intake Labs 03/20/21: Hep B sAg (-), sAb (-), cAb (-); Hep A (-), Hep C (-) Quantiferon () HLA B*5701 () G6PD: ()   Previous Regimens: Biktarvy     Genotypes: 02-2021 - Wildtype    Subjective:   Chief Complaint  Patient presents with   Follow-up      Discussed the use of AI scribe software for clinical note transcription with the patient, who gave verbal consent to proceed.  History of Present Illness   Lee Bridges is a 33 year old male with HIV who presents for a follow-up visit after acute [facility] stay.   Lee Bridges arrives here from [facility] for drug rehab. He has gained weight, now weighing 148 pounds, which is the highest he has been in recent years. No desire for cigarettes, and he feels that staying off drugs has improved his mental health.   Recent lab work from September 10, 2023, showed a significant decrease in viral load from 196,000 to 910. His CD4 count has increased to 260. He has been taking his medication, Biktarvy , consistently every day. He has previously taken Bactrim  and Atovaquone  but has stopped these as his CD4 count has improved.  He has completed a rehabilitation program and is considering aftercare and job opportunities. His family, including his mother and sister, are supportive and proud of his progress.           03/29/2023    8:45 AM  Depression screen PHQ 2/9  Decreased Interest   Down, Depressed, Hopeless   PHQ - 2 Score   Altered sleeping   Tired, decreased energy   Change in appetite   Feeling bad or failure about yourself    Trouble concentrating   Moving slowly or fidgety/restless   Suicidal thoughts   PHQ-9 Score   Difficult doing work/chores      Information is  confidential and restricted. Go to Review Flowsheets to unlock data.    Review of Systems  Constitutional:  Negative for chills, fever, malaise/fatigue and weight loss.  All other systems reviewed and are negative.   Past Medical History:  Diagnosis Date   Depression    HIV (human immunodeficiency virus infection) (HCC)    Psychosis (HCC)    Substance abuse (HCC)     Outpatient Medications Prior to Visit  Medication Sig Dispense Refill   atovaquone  (MEPRON ) 750 MG/5ML suspension Take 10 mLs (1,500 mg total) by mouth daily with breakfast. 300 mL 0   bictegravir-emtricitabine -tenofovir  AF (BIKTARVY ) 50-200-25 MG TABS tablet Take 1 tablet by mouth daily. 30 tablet 0   DULoxetine  (CYMBALTA ) 60 MG capsule Take 1 capsule (60 mg total) by mouth daily. 30 capsule 0   hydrOXYzine  (ATARAX ) 25 MG tablet Take 1 tablet (25 mg total) by mouth 3 (three) times daily. 90 tablet 0   methocarbamol  (ROBAXIN ) 500 MG tablet Take 1 tablet (500 mg total) by mouth every 8 (eight) hours as needed for muscle spasms. 90 tablet 0   QUEtiapine  (SEROQUEL ) 400 MG tablet Take 1 tablet (400 mg total) by mouth at bedtime. 30 tablet 0   nicotine  (NICODERM CQ  - DOSED IN MG/24 HOURS) 14 mg/24hr patch Place 1 patch (14 mg total) onto the skin daily. (  Patient not taking: Reported on 10/05/2023) 28 patch 0   No facility-administered medications prior to visit.     Allergies  Allergen Reactions   Trazodone  And Nefazodone     severe   Fish Allergy Hives and Nausea And Vomiting   Shellfish Allergy Hives and Nausea And Vomiting    Social History   Tobacco Use   Smoking status: Former    Current packs/day: 0.50    Types: Cigarettes   Smokeless tobacco: Never  Substance Use Topics   Alcohol use: Not Currently    Comment: socially   Drug use: Not Currently    Types: Cocaine, Marijuana, Methamphetamines    Comment: 45 days sober as of 10/05/23    Social History   Substance and Sexual Activity  Sexual Activity Not  Currently     Objective:   Vitals:   10/05/23 1314  BP: 118/79  Pulse: (!) 107  Temp: 99.4 F (37.4 C)  TempSrc: Temporal  SpO2: 96%  Weight: 148 lb (67.1 kg)  Height: 5\' 2"  (1.575 m)   Body mass index is 27.07 kg/m.  Physical Exam Constitutional:      Appearance: Normal appearance. He is not ill-appearing.  HENT:     Head: Normocephalic.     Mouth/Throat:     Mouth: Mucous membranes are moist.     Pharynx: Oropharynx is clear.  Eyes:     General: No scleral icterus. Cardiovascular:     Rate and Rhythm: Normal rate and regular rhythm.  Pulmonary:     Effort: Pulmonary effort is normal.  Musculoskeletal:        General: Normal range of motion.     Cervical back: Normal range of motion.  Skin:    Capillary Refill: Capillary refill takes less than 2 seconds.     Coloration: Skin is not jaundiced or pale.  Neurological:     Mental Status: He is alert and oriented to person, place, and time.  Psychiatric:        Mood and Affect: Mood normal.        Judgment: Judgment normal.     Lab Results Lab Results  Component Value Date   WBC 4.0 09/13/2023   HGB 11.9 (L) 09/13/2023   HCT 35.9 (L) 09/13/2023   MCV 105.9 (H) 09/13/2023   PLT 254 09/13/2023    Lab Results  Component Value Date   CREATININE 1.11 09/13/2023   BUN 10 09/13/2023   NA 137 09/13/2023   K 4.0 09/13/2023   CL 101 09/13/2023   CO2 23 09/13/2023    Lab Results  Component Value Date   ALT 16 09/08/2023   AST 17 09/08/2023   ALKPHOS 46 09/08/2023   BILITOT 0.4 09/08/2023    Lab Results  Component Value Date   CHOL 167 08/12/2023   HDL 30 (L) 08/12/2023   LDLCALC 89 08/12/2023   TRIG 239 (H) 08/12/2023   CHOLHDL 5.6 08/12/2023   HIV 1 RNA Quant (copies/mL)  Date Value  09/10/2023 910  09/08/2023 6,410  07/29/2023 196,000   CD4 T Cell Abs (/uL)  Date Value  09/08/2023 258 (L)  07/29/2023 171 (L)  03/11/2023 205 (L)     Assessment & Plan:     HIV infection -  VL 910, CD4  258 -  HIV infection with significant improvement. Viral load decreased from 196,000 to 910 in two months. CD4 count increased to 260. Biktarvy  is effectively managing the infection. No longer requires prophylactic medications like Bactrim   or atovaquone  due to improved immune status. Will write a note to [facility] to stop this formally. I think he will need repeated hep b vaccination at upcoming visit - will offer then.  - Continue Biktarvy  daily to maintain viral suppression. - HIV RNA level today to ensure undetectable  - Discontinue atovaquone  suspension as immune system is sufficiently strong. 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 Syphilis titers indicate successful treatment. and have reduced > 4 fold. Previously serofast around 1:8. Will continue to monitor closely Q3-4 months. We discussed today and answered all questions he had.  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 He is in rehabilitation through [facility]. No current desire for cigarettes or drugs, contributing to better adherence to HIV treatment. He is working with their team on transition plan vs extension of stay.  - Continue rehabilitation program and consider extension for aftercare and job placement. -Written letter provided to [facility] confirming the ability to stop atovaquone .  Recording duration: 14 minutes      No orders of the defined types were placed in this encounter.  Orders Placed This Encounter  Procedures   HIV 1 RNA quant-no reflex-bld   Return in about 3 months (around 01/05/2024).   Gibson Kurtz, MSN, NP-C [facility] Pager: [phone] Office: [phone]  10/05/23  1:23 PM

## 2023-10-05 NOTE — Patient Instructions (Signed)
 STOP the atovaquone  syrup   CONTINUE the biktarvy  everyday   Will have you back in 2-3 months.

## 2023-10-07 ENCOUNTER — Other Ambulatory Visit: Payer: Self-pay

## 2023-10-07 ENCOUNTER — Telehealth: Payer: Self-pay

## 2023-10-07 LAB — HIV-1 RNA QUANT-NO REFLEX-BLD
HIV 1 RNA Quant: <20 {copies}/mL — AB
HIV-1 RNA Quant, Log: <1.3 {Log_copies}/mL — AB

## 2023-10-07 NOTE — Telephone Encounter (Signed)
-----   Message from Dodge sent at 10/07/2023  7:17 AM EDT ----- Would you mind calling Masaaki to let him know his excellent lab results :)

## 2023-10-07 NOTE — Telephone Encounter (Signed)
 Patient aware of results.

## 2023-10-07 NOTE — Telephone Encounter (Signed)
 Called [facility] to relay results to [facility], had to leave a voicemail for counselor Edison Gore requesting that Jonell Neptune give our office a call back.   Mannat Benedetti, BSN, RN

## 2023-10-09 ENCOUNTER — Emergency Department (HOSPITAL_COMMUNITY): Payer: MEDICAID

## 2023-10-09 ENCOUNTER — Encounter (HOSPITAL_COMMUNITY): Payer: Self-pay

## 2023-10-09 ENCOUNTER — Other Ambulatory Visit: Payer: Self-pay

## 2023-10-09 ENCOUNTER — Emergency Department (HOSPITAL_COMMUNITY)
Admission: EM | Admit: 2023-10-09 | Discharge: 2023-10-09 | Disposition: A | Discharge status: Home / Self Care | Payer: MEDICAID | Attending: Emergency Medicine | Admitting: Emergency Medicine

## 2023-10-09 DIAGNOSIS — R079 Chest pain, unspecified: Secondary | ICD-10-CM | POA: Diagnosis not present

## 2023-10-09 DIAGNOSIS — N289 Disorder of kidney and ureter, unspecified: Secondary | ICD-10-CM | POA: Insufficient documentation

## 2023-10-09 DIAGNOSIS — D539 Nutritional anemia, unspecified: Secondary | ICD-10-CM

## 2023-10-09 DIAGNOSIS — R7309 Other abnormal glucose: Secondary | ICD-10-CM | POA: Diagnosis not present

## 2023-10-09 DIAGNOSIS — R1012 Left upper quadrant pain: Secondary | ICD-10-CM | POA: Insufficient documentation

## 2023-10-09 DIAGNOSIS — R7401 Elevation of levels of liver transaminase levels: Secondary | ICD-10-CM | POA: Diagnosis not present

## 2023-10-09 DIAGNOSIS — Z21 Asymptomatic human immunodeficiency virus [HIV] infection status: Secondary | ICD-10-CM | POA: Insufficient documentation

## 2023-10-09 DIAGNOSIS — D649 Anemia, unspecified: Secondary | ICD-10-CM | POA: Diagnosis not present

## 2023-10-09 DIAGNOSIS — R042 Hemoptysis: Secondary | ICD-10-CM | POA: Insufficient documentation

## 2023-10-09 DIAGNOSIS — R739 Hyperglycemia, unspecified: Secondary | ICD-10-CM

## 2023-10-09 LAB — BASIC METABOLIC PANEL WITH GFR
Anion gap: 9 (ref 5–15)
BUN: 18 mg/dL (ref 6–20)
CO2: 25 mmol/L (ref 22–32)
Calcium: 9.3 mg/dL (ref 8.9–10.3)
Chloride: 103 mmol/L (ref 98–111)
Creatinine, Ser: 1.3 mg/dL — ABNORMAL HIGH (ref 0.61–1.24)
GFR, Estimated: >60 mL/min (ref >60)
Glucose, Bld: 106 mg/dL — ABNORMAL HIGH (ref 70–99)
Potassium: 4.2 mmol/L (ref 3.5–5.1)
Sodium: 137 mmol/L (ref 135–145)

## 2023-10-09 LAB — CBC
HCT: 34.2 % — ABNORMAL LOW (ref 39.0–52.0)
Hemoglobin: 11.4 g/dL — ABNORMAL LOW (ref 13.0–17.0)
MCH: 34.7 pg — ABNORMAL HIGH (ref 26.0–34.0)
MCHC: 33.3 g/dL (ref 30.0–36.0)
MCV: 104 fL — ABNORMAL HIGH (ref 80.0–100.0)
Platelets: 223 10*3/uL (ref 150–400)
RBC: 3.29 MIL/uL — ABNORMAL LOW (ref 4.22–5.81)
RDW: 13.9 % (ref 11.5–15.5)
WBC: 5.1 10*3/uL (ref 4.0–10.5)
nRBC: 0 % (ref 0.0–0.2)

## 2023-10-09 LAB — HEPATIC FUNCTION PANEL
ALT: 51 U/L — ABNORMAL HIGH (ref 0–44)
AST: 48 U/L — ABNORMAL HIGH (ref 15–41)
Albumin: 3.5 g/dL (ref 3.5–5.0)
Alkaline Phosphatase: 50 U/L (ref 38–126)
Bilirubin, Direct: 0.3 mg/dL — ABNORMAL HIGH (ref 0.0–0.2)
Indirect Bilirubin: 0.7 mg/dL (ref 0.3–0.9)
Total Bilirubin: 1 mg/dL (ref 0.0–1.2)
Total Protein: 6.7 g/dL (ref 6.5–8.1)

## 2023-10-09 LAB — TROPONIN I (HIGH SENSITIVITY)
Troponin I (High Sensitivity): 8 ng/L (ref <18)
Troponin I (High Sensitivity): 6 ng/L (ref <18)

## 2023-10-09 LAB — LIPASE, BLOOD: Lipase: 33 U/L (ref 11–51)

## 2023-10-09 MED ORDER — IOHEXOL 350 MG/ML SOLN
75.0000 mL | Freq: Once | INTRAVENOUS | Status: AC | PRN
  Administered 2023-10-09: 75 mL via INTRAVENOUS

## 2023-10-09 NOTE — Discharge Instructions (Addendum)
 Your evaluation did not show any serious problem in your chest or abdomen.  I suspect the pain is related to some pulled muscles.  Apply ice for 30 minutes at a time, 4 times a day.  You may take acetaminophen  and/or ibuprofen  as needed for pain.  Please be aware that if you combine ibuprofen  and acetaminophen , he will get better pain relief  and you get from taking either medication by itself.  Return to the emergency department if you have any new or concerning symptoms.

## 2023-10-09 NOTE — ED Notes (Signed)
 Patient verbalizes understanding of discharge instructions. Opportunity for questioning and answers were provided. Pt discharged from ED.

## 2023-10-09 NOTE — ED Triage Notes (Signed)
 Pt arrived POV from home c/o left sided CP x2 weeks that has been constant. Pt states she has been dizzy has bilateral feet pain, has trouble walking and it hurts to lean to the left.

## 2023-10-09 NOTE — ED Notes (Signed)
 Patient transported to CT

## 2023-10-09 NOTE — ED Notes (Signed)
 Pt resting with eyes closed; respirations spontaneous, even, unlabored

## 2023-10-09 NOTE — ED Provider Notes (Signed)
 [facility] Provider Note   CSN: [id number] Arrival date & time: 10/09/23  0300     History  Chief Complaint  Patient presents with   Chest Pain    Lee Bridges is a 33 y.o. male.  The history is provided by the patient.  Chest Pain He has history of HIV disease and comes in complaining of pain in the left anterolateral rib cage for the last 2 weeks.  Pain is intermittent.  It is worse with a deep breath, worse if he leans to the left, worse if he lays on his left side.  Nothing seems to make it better.  He denies dyspnea, nausea, diaphoresis.  He denies any urinary symptoms.  He denies fever or chills.  He has had not some coughing and has had some hemoptysis.  He tells me that he quit smoking but he had it cigarettes a day.  There is no history of hypertension, diabetes, hyperlipidemia and there is no family history of premature coronary atherosclerosis.   Home Medications Prior to Admission medications   Medication Sig Start Date End Date Taking? Authorizing Provider  bictegravir-emtricitabine -tenofovir  AF (BIKTARVY ) 50-200-25 MG TABS tablet Take 1 tablet by mouth daily. 09/16/23   Alver Jobs, MD  DULoxetine  (CYMBALTA ) 60 MG capsule Take 1 capsule (60 mg total) by mouth daily. 09/20/23   Alver Jobs, MD  hydrOXYzine  (ATARAX ) 25 MG tablet Take 1 tablet (25 mg total) by mouth 3 (three) times daily. 09/19/23   Alver Jobs, MD  methocarbamol  (ROBAXIN ) 500 MG tablet Take 1 tablet (500 mg total) by mouth every 8 (eight) hours as needed for muscle spasms. 09/15/23   Alver Jobs, MD  nicotine  (NICODERM CQ  - DOSED IN MG/24 HOURS) 14 mg/24hr patch Place 1 patch (14 mg total) onto the skin daily. Patient not taking: Reported on 10/05/2023 09/16/23   Alver Jobs, MD  QUEtiapine  (SEROQUEL ) 400 MG tablet Take 1 tablet (400 mg total) by mouth at bedtime. 09/15/23   Alver Jobs, MD      Allergies    Trazodone  and nefazodone, Fish allergy, and  Shellfish allergy    Review of Systems   Review of Systems  Cardiovascular:  Positive for chest pain.  All other systems reviewed and are negative.   Physical Exam Updated Vital Signs BP 121/79   Pulse 97   Temp 98 F (36.7 C) (Oral)   Resp 16   Ht 5\' 2"  (1.575 m)   Wt 72.6 kg   SpO2 99%   BMI 29.26 kg/m  Physical Exam Vitals and nursing note reviewed.   33 year old male, resting comfortably and in no acute distress. Vital signs are significant for borderline elevated heart rate. Oxygen saturation is 98%, which is normal. Head is normocephalic and atraumatic. PERRLA, EOMI.  Back is nontender and there is no CVA tenderness. Lungs are clear without rales, wheezes, or rhonchi. Chest is nontender. Heart has regular rate and rhythm without murmur. Abdomen is soft, flat, with marked tenderness in the left subcostal region.  Tenderness is well localized.  There is no rebound or guarding. Extremities have no cyanosis or edema, full range of motion is present. Skin is warm and dry without rash. Neurologic: Mental status is normal, cranial nerves are intact, there are no motor or sensory deficits.  ED Results / Procedures / Treatments   Labs (all labs ordered are listed, but only abnormal results are displayed) Labs Reviewed  BASIC METABOLIC PANEL WITH GFR -  Abnormal; Notable for the following components:      Result Value   Glucose, Bld 106 (*)    Creatinine, Ser 1.30 (*)    All other components within normal limits  CBC - Abnormal; Notable for the following components:   RBC 3.29 (*)    Hemoglobin 11.4 (*)    HCT 34.2 (*)    MCV 104.0 (*)    MCH 34.7 (*)    All other components within normal limits  HEPATIC FUNCTION PANEL - Abnormal; Notable for the following components:   AST 48 (*)    ALT 51 (*)    Bilirubin, Direct 0.3 (*)    All other components within normal limits  LIPASE, BLOOD  TROPONIN I (HIGH SENSITIVITY)  TROPONIN I (HIGH SENSITIVITY)    EKG EKG  Interpretation Date/Time:  Sunday Oct 09 2023 03:09:39 EDT Ventricular Rate:  105 PR Interval:  122 QRS Duration:  86 QT Interval:  334 QTC Calculation: 441 R Axis:   88  Text Interpretation: Sinus tachycardia ST & T wave abnormality, consider inferior ischemia ST & T wave abnormality, consider anterolateral ischemia Left ventricular hypertrophy with secondary REPOLARIZATION ABNORMALITY Abnormal ECG When compared with ECG of 05-Sep-2023 15:10, No significant change was found Confirmed by Alissa April ([id number]) on 10/09/2023 4:15:14 AM  Radiology CT Angio Chest PE W and/or Wo Contrast Result Date: 10/09/2023 CLINICAL DATA:  Left upper quadrant pain. Pulmonary M below some is suspected. High probability. EXAM: CT ANGIOGRAPHY CHEST CT ABDOMEN AND PELVIS WITH CONTRAST TECHNIQUE: Multidetector CT imaging of the chest was performed using the standard protocol during bolus administration of intravenous contrast. Multiplanar CT image reconstructions and MIPs were obtained to evaluate the vascular anatomy. Multidetector CT imaging of the abdomen and pelvis was performed using the standard protocol during bolus administration of intravenous contrast. RADIATION DOSE REDUCTION: This exam was performed according to the departmental dose-optimization program which includes automated exposure control, adjustment of the mA and/or kV according to patient size and/or use of iterative reconstruction technique. CONTRAST:  75mL OMNIPAQUE  IOHEXOL  350 MG/ML SOLN COMPARISON:  CT AP from 10/09/2020 FINDINGS: CTA CHEST FINDINGS Cardiovascular: Suboptimal opacification of the pulmonary arteries to the segmental level. Within this limitation, no signs of central obstructing pulmonary embolus. The heart size appears normal. No pericardial effusion. Mediastinum/Nodes: No enlarged mediastinal, hilar, or axillary lymph nodes. Thyroid  gland, trachea, and esophagus demonstrate no significant findings. Lungs/Pleura: No pleural effusion,  airspace consolidation, atelectasis or pneumothorax. Mild diffuse bronchial wall thickening identified. A few patchy areas of ground-glass attenuation noted within the dependent portions of the lungs. No suspicious pulmonary nodule or mass identified. Musculoskeletal: No chest wall abnormality. No acute or significant osseous findings. Review of the MIP images confirms the above findings. CT ABDOMEN and PELVIS FINDINGS Hepatobiliary: No focal liver abnormality. Decompressed gallbladder. No acute abnormality. No bile duct dilatation. Pancreas: Unremarkable. No pancreatic ductal dilatation or surrounding inflammatory changes. Spleen: Normal in size without focal abnormality. Adrenals/Urinary Tract: Normal adrenal glands. No nephrolithiasis, hydronephrosis or mass. Urinary bladder appears normal. Stomach/Bowel: Stomach appears normal. There is no pathologic dilatation of the large or small bowel loops to suggest obstruction. The appendix is visualized and appears normal. No bowel wall thickening or inflammatory change identified. Mild retained stool identified within the rectum. Vascular/Lymphatic: Normal appearance of the abdominal aorta. The upper abdominal vascularity is patent. No abdominal or pelvic adenopathy. Reproductive: Prostate is unremarkable. Other: No free fluid or fluid collections. No signs of pneumoperitoneum. Musculoskeletal: Choose 1 Review of the  MIP images confirms the above findings. IMPRESSION: 1. Suboptimal opacification of the pulmonary arteries to the segmental level. Within this limitation, no signs of central obstructing pulmonary embolus. Repeat imaging as clinically indicated. 2. Mild diffuse bronchial wall thickening identified. A few patchy areas of ground-glass attenuation noted within the dependent portions of the lungs. Findings are nonspecific but may reflect sequelae of bronchitis. 3. No acute findings within the abdomen or pelvis. Electronically Signed   By: Kimberley Penman M.D.    On: 10/09/2023 06:21   CT ABDOMEN PELVIS W CONTRAST Result Date: 10/09/2023 CLINICAL DATA:  Left upper quadrant pain. Pulmonary M below some is suspected. High probability. EXAM: CT ANGIOGRAPHY CHEST CT ABDOMEN AND PELVIS WITH CONTRAST TECHNIQUE: Multidetector CT imaging of the chest was performed using the standard protocol during bolus administration of intravenous contrast. Multiplanar CT image reconstructions and MIPs were obtained to evaluate the vascular anatomy. Multidetector CT imaging of the abdomen and pelvis was performed using the standard protocol during bolus administration of intravenous contrast. RADIATION DOSE REDUCTION: This exam was performed according to the departmental dose-optimization program which includes automated exposure control, adjustment of the mA and/or kV according to patient size and/or use of iterative reconstruction technique. CONTRAST:  75mL OMNIPAQUE  IOHEXOL  350 MG/ML SOLN COMPARISON:  CT AP from 10/09/2020 FINDINGS: CTA CHEST FINDINGS Cardiovascular: Suboptimal opacification of the pulmonary arteries to the segmental level. Within this limitation, no signs of central obstructing pulmonary embolus. The heart size appears normal. No pericardial effusion. Mediastinum/Nodes: No enlarged mediastinal, hilar, or axillary lymph nodes. Thyroid  gland, trachea, and esophagus demonstrate no significant findings. Lungs/Pleura: No pleural effusion, airspace consolidation, atelectasis or pneumothorax. Mild diffuse bronchial wall thickening identified. A few patchy areas of ground-glass attenuation noted within the dependent portions of the lungs. No suspicious pulmonary nodule or mass identified. Musculoskeletal: No chest wall abnormality. No acute or significant osseous findings. Review of the MIP images confirms the above findings. CT ABDOMEN and PELVIS FINDINGS Hepatobiliary: No focal liver abnormality. Decompressed gallbladder. No acute abnormality. No bile duct dilatation. Pancreas:  Unremarkable. No pancreatic ductal dilatation or surrounding inflammatory changes. Spleen: Normal in size without focal abnormality. Adrenals/Urinary Tract: Normal adrenal glands. No nephrolithiasis, hydronephrosis or mass. Urinary bladder appears normal. Stomach/Bowel: Stomach appears normal. There is no pathologic dilatation of the large or small bowel loops to suggest obstruction. The appendix is visualized and appears normal. No bowel wall thickening or inflammatory change identified. Mild retained stool identified within the rectum. Vascular/Lymphatic: Normal appearance of the abdominal aorta. The upper abdominal vascularity is patent. No abdominal or pelvic adenopathy. Reproductive: Prostate is unremarkable. Other: No free fluid or fluid collections. No signs of pneumoperitoneum. Musculoskeletal: Choose 1 Review of the MIP images confirms the above findings. IMPRESSION: 1. Suboptimal opacification of the pulmonary arteries to the segmental level. Within this limitation, no signs of central obstructing pulmonary embolus. Repeat imaging as clinically indicated. 2. Mild diffuse bronchial wall thickening identified. A few patchy areas of ground-glass attenuation noted within the dependent portions of the lungs. Findings are nonspecific but may reflect sequelae of bronchitis. 3. No acute findings within the abdomen or pelvis. Electronically Signed   By: Kimberley Penman M.D.   On: 10/09/2023 06:21   DG Chest 2 View Result Date: 10/09/2023 CLINICAL DATA:  Chest pain EXAM: CHEST - 2 VIEW COMPARISON:  07/29/2023 FINDINGS: Heart size and mediastinal contours appear normal. There is no pleural fluid, interstitial edema or airspace consolidation. Rounded nodular opacity overlying the perihilar left upper lobe is felt  to likely represent cardiac lead attachment. Visualized osseous structures are unremarkable. IMPRESSION: No acute cardiopulmonary abnormalities. Rounded nodular opacity in the perihilar left upper lobe is  felt to represent artifact from cardiac lead attachment. Correlation with physical exam findings advised. Electronically Signed   By: Kimberley Penman M.D.   On: 10/09/2023 04:50    Procedures Procedures  Cardiac monitor shows normal sinus rhythm, per my interpretation.  Medications Ordered in ED Medications  iohexol  (OMNIPAQUE ) 350 MG/ML injection 75 mL (75 mLs Intravenous Contrast Given 10/09/23 0540)    ED Course/ Medical Decision Making/ A&P                                 Medical Decision Making Amount and/or Complexity of Data Reviewed Labs: ordered. Radiology: ordered.  Risk Prescription drug management.   Chest pain which on exam appears to actually be left upper quadrant pain.  This presentation and has wide range of treatment since then his associated with high risk of morbidity and complications.  Differential diagnoses include, but are not limited to, unstable angina, pneumonia, musculoskeletal pain, gastritis, peptic ulcer disease, diverticulitis, pulmonary embolism.  I have reviewed his electrocardiogram, my interpretation is left ventricular hypertrophy with secondary repolarization changes unchanged from prior.  In addition to laboratory workup, I have ordered CT angiogram of the chest and CT of abdomen and pelvis.  I have reviewed his laboratory tests, and my interpretation is stable renal insufficiency, stable anemia, borderline elevated random glucose which will need to be followed as an outpatient, normal troponin x 2.  Hepatic function panel and lipase are still pending.  CT angiogram of the chest shows no acute findings, CT of abdomen and pelvis shows no acute findings.  At this point, I feel his pain is musculoskeletal, no good explanation for hemoptysis but that seems to have been self-limited.  I am discharging him with instructions to apply ice, use over-the-counter NSAIDs and acetaminophen  as needed for pain.  Return for worsening symptoms.  Transaminases have  come back minimally elevated - of doubtful clinical significance.  Final Clinical Impression(s) / ED Diagnoses Final diagnoses:  LUQ abdominal pain  Renal insufficiency  Macrocytic anemia  Elevated random blood glucose level  Elevated transaminase level    Rx / DC Orders ED Discharge Orders     None         Alissa April, MD 10/09/23 2254

## 2023-10-10 ENCOUNTER — Other Ambulatory Visit: Payer: Self-pay

## 2023-10-11 ENCOUNTER — Other Ambulatory Visit (HOSPITAL_COMMUNITY): Payer: Self-pay

## 2023-10-20 ENCOUNTER — Other Ambulatory Visit (HOSPITAL_COMMUNITY): Payer: Self-pay

## 2023-10-20 ENCOUNTER — Other Ambulatory Visit: Payer: Self-pay | Admitting: Infectious Diseases

## 2023-10-20 ENCOUNTER — Other Ambulatory Visit: Payer: Self-pay

## 2023-10-21 ENCOUNTER — Other Ambulatory Visit: Payer: Self-pay

## 2023-10-21 MED FILL — Bictegravir-Emtricitabine-Tenofovir AF Tab 50-200-25 MG: ORAL | 30 days supply | Qty: 30 | Fill #0 | Status: CN

## 2023-10-26 ENCOUNTER — Other Ambulatory Visit: Payer: Self-pay

## 2023-12-01 ENCOUNTER — Other Ambulatory Visit: Payer: Self-pay

## 2023-12-28 ENCOUNTER — Ambulatory Visit: Payer: MEDICAID | Admitting: Infectious Diseases

## 2024-01-25 ENCOUNTER — Other Ambulatory Visit: Payer: Self-pay

## 2024-02-07 ENCOUNTER — Other Ambulatory Visit (HOSPITAL_COMMUNITY)
Admission: RE | Admit: 2024-02-07 | Discharge: 2024-02-07 | Disposition: A | Discharge status: Home / Self Care | Payer: MEDICAID | Source: Ambulatory Visit | Attending: Infectious Diseases | Admitting: Infectious Diseases

## 2024-02-07 ENCOUNTER — Other Ambulatory Visit (HOSPITAL_COMMUNITY): Payer: Self-pay

## 2024-02-07 ENCOUNTER — Other Ambulatory Visit: Payer: Self-pay

## 2024-02-07 ENCOUNTER — Ambulatory Visit: Payer: MEDICAID | Admitting: Pharmacist

## 2024-02-07 DIAGNOSIS — Z21 Asymptomatic human immunodeficiency virus [HIV] infection status: Secondary | ICD-10-CM | POA: Insufficient documentation

## 2024-02-07 DIAGNOSIS — Z113 Encounter for screening for infections with a predominantly sexual mode of transmission: Secondary | ICD-10-CM | POA: Insufficient documentation

## 2024-02-07 DIAGNOSIS — Z23 Encounter for immunization: Secondary | ICD-10-CM

## 2024-02-07 MED ORDER — QUETIAPINE FUMARATE 300 MG PO TABS: 300.0000 mg | ORAL_TABLET | Freq: Every day | ORAL | 0 refills | Status: DC

## 2024-02-07 MED ORDER — BIKTARVY 50-200-25 MG PO TABS: 1.0000 | ORAL_TABLET | Freq: Every day | ORAL | 5 refills | Status: AC

## 2024-02-07 NOTE — Progress Notes (Signed)
 HPI: Lee Bridges is a 33 y.o. male who presents to the [facility] pharmacy clinic for HIV follow-up.  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   DULOXETINE  (CYMBALTA ) 60 MG CAPSULE    Take 1 capsule (60 mg total) by mouth daily.   HYDROXYZINE  (ATARAX ) 25 MG TABLET    Take 1 tablet (25 mg total) by mouth 3 (three) times daily.   METHOCARBAMOL  (ROBAXIN ) 500 MG TABLET    Take 1 tablet (500 mg total) by mouth every 8 (eight) hours as needed for muscle spasms.   NICOTINE  (NICODERM CQ  - DOSED IN MG/24 HOURS) 14 MG/24HR PATCH    Place 1 patch (14 mg total) onto the skin daily.   QUETIAPINE  (SEROQUEL ) 400 MG TABLET    Take 1 tablet (400 mg total) by mouth at bedtime.  Modified Medications   No medications on file  Discontinued Medications   No medications on file    Labs: Lab Results  Component Value Date   HIV1RNAQUANT <20 DETECTED (A) 10/05/2023   HIV1RNAQUANT 910 09/10/2023   HIV1RNAQUANT 6,410 09/08/2023   CD4TABS 258 (L) 09/08/2023   CD4TABS  171 (L) 07/29/2023   CD4TABS 205 (L) 03/11/2023    RPR and STI Lab Results  Component Value Date   LABRPR Reactive (A) 09/09/2023   LABRPR Reactive (A) 07/29/2023   LABRPR Reactive (A) 03/11/2023   LABRPR REACTIVE (A) 12/23/2022   LABRPR REACTIVE (A) 07/02/2021   RPRTITER 1:8 (H) 12/23/2022   RPRTITER 1:8 (H) 07/02/2021   RPRTITER 1:16 (H) 03/20/2021    STI Results GC CT  07/28/2023 12:17 PM Negative  Negative   12/23/2022 11:05 AM Negative  Negative   12/23/2022 11:04 AM Negative  Negative   03/20/2021 10:08 AM Negative    Negative  Negative    Negative   03/20/2021  9:34 AM Negative  Negative     Hepatitis B Lab Results  Component Value Date   HEPBSAB NON-REACTIVE 03/20/2021   HEPBSAG NON-REACTIVE 03/20/2021   Hepatitis C Lab Results  Component Value Date   HEPCAB NON-REACTIVE 03/20/2021   Hepatitis A Lab Results  Component Value Date   HAV NON-REACTIVE 03/20/2021   Lipids: Lab Results  Component Value Date   CHOL 167 08/12/2023   TRIG 239 (H) 08/12/2023   HDL 30 (L) 08/12/2023   CHOLHDL 5.6 08/12/2023   VLDL 48 (H) 08/12/2023   LDLCALC 89 08/12/2023    Current HIV Regimen: Biktarvy   Assessment: Lee Bridges comes in today to follow up for his HIV. He walked into clinic this morning and stated that he was unable to get his Biktarvy  filled at [facility] due  to the cost. I had an opening on my schedule luckily and was able to see him. He has missed several appointments recently and last saw Lee Bridges in May. His viral load was undetectable at that visit. Last CD4 count was 258 in April 2025.   His Biktarvy  is $0 through his insurance, so I think the issue is that he ran out of refills. His Biktarvy  was sent to [facility] at [facility], but he prefers to fill at [facility]. Fill records indicate that he hasn't filled Biktarvy  since April, but he states that he had a stash and has been taking it daily with no missed doses at all. Will send  refills to [facility] today for him. Requesting STI testing today but states that he has only had oral sex recently and is only requesting an oral screen.  He is also requesting a 30 day supply of his Seroquel  to last until he can get back into care with his mental health providers. I agreed to a one time refill and will send that to [facility] as well.  Due for 2nd HPV and annual flu vaccines; he accepts both. Will check labs today and schedule him to see Lee Bridges in 4-6 weeks.  Plan: - HIV RNA with reflex, CD4 count, RPR, and oral cytology today - Refill Biktarvy ; sent to [facility] - One time fill of Seroquel ; sent to [facility] - HPV #1 of 3; last one due in 12 weeks - Administered 2025-2026 influenza vaccine  - Follow up with Lee Bridges on 03/20/24  Lee Bridges, PharmD, BCIDP, AAHIVP, CPP Clinical Pharmacist Practitioner - Infectious Diseases Clinical Pharmacist Lead - Specialty Pharmacy [facility]

## 2024-02-07 NOTE — Progress Notes (Signed)
 PATIENT IS NOW FILLING [facility] PER REQUEST

## 2024-02-08 LAB — CYTOLOGY, (ORAL, ANAL, URETHRAL) ANCILLARY ONLY
Chlamydia: NEGATIVE
Comment: NEGATIVE
Comment: NORMAL
Neisseria Gonorrhea: NEGATIVE

## 2024-02-08 LAB — T-HELPER CELLS (CD4) COUNT (NOT AT ARMC)
CD4 % Helper T Cell: 18 % — ABNORMAL LOW (ref 33–65)
CD4 T Cell Abs: 389 /uL — ABNORMAL LOW (ref 400–1790)

## 2024-02-11 LAB — HIV RNA, RTPCR W/R GT (RTI, PI,INT)
HIV 1 RNA Quant: 47 {copies}/mL — ABNORMAL HIGH
HIV-1 RNA Quant, Log: 1.67 {Log_copies}/mL — ABNORMAL HIGH

## 2024-02-11 LAB — RPR: RPR Ser Ql: REACTIVE — AB

## 2024-02-11 LAB — RPR TITER: RPR Titer: 1:16 {titer} — ABNORMAL HIGH

## 2024-02-11 LAB — T PALLIDUM AB: T Pallidum Abs: POSITIVE — AB

## 2024-03-20 ENCOUNTER — Ambulatory Visit: Payer: MEDICAID | Admitting: Infectious Diseases

## 2024-04-12 ENCOUNTER — Ambulatory Visit: Payer: MEDICAID | Admitting: Infectious Diseases

## 2024-04-25 ENCOUNTER — Telehealth (HOSPITAL_COMMUNITY): Payer: Self-pay | Admitting: *Deleted

## 2024-04-25 NOTE — Telephone Encounter (Signed)
 Fax request for olanzapine . Reveiwed his record. Not seen here in many months, has cancelled multiple appts or they have been cancelled. His pharmacy making the request is in [place] which [place] not in [facility] and about 1.5 hours away. Due to him being out of our area and not being seen here in over 7 months declining this request.

## 2024-05-12 ENCOUNTER — Ambulatory Visit (HOSPITAL_COMMUNITY)
Admission: EM | Admit: 2024-05-12 | Discharge: 2024-05-12 | Disposition: A | Discharge status: Home / Self Care | Payer: MEDICAID | Attending: Psychiatry | Admitting: Psychiatry

## 2024-05-12 DIAGNOSIS — Z21 Asymptomatic human immunodeficiency virus [HIV] infection status: Secondary | ICD-10-CM

## 2024-05-12 DIAGNOSIS — F330 Major depressive disorder, recurrent, mild: Secondary | ICD-10-CM

## 2024-05-12 MED ORDER — QUETIAPINE FUMARATE 300 MG PO TABS: 300.0000 mg | ORAL_TABLET | Freq: Every day | ORAL | 0 refills | Status: AC

## 2024-05-12 MED ORDER — DULOXETINE HCL 20 MG PO CPEP: 20.0000 mg | ORAL_CAPSULE | Freq: Every day | ORAL | 0 refills | Status: AC

## 2024-05-12 NOTE — ED Provider Notes (Signed)
 Behavioral Health Urgent Care Medical Screening Exam  Patient Name: Lee Bridges MRN: [id number] Date of Evaluation: 05/12/2024 Chief Complaint:  Depression  Diagnosis:  Final diagnoses:  Mild episode of recurrent major depressive disorder    History of Present illness: Lee Bridges 33 y.o., male patient presented to [facility] as a voluntary walk in unaccompanied with complaints of depressive symptoms due to stressors and requesting medication refill. Lee Bridges, is seen face to face by this provider and chart reviewed on 05/12/2024. Per chart review, pt has a psychiatric history of MDD, GAD, polysubstance use, and substance-induced psychosis.  Patient has a past medical history of neurosyphilis and HIV.  Last inpatient hospitalization was May 2025 at [facility].  Patient did not follow-up with outpatient resources at that time but did receive medication refill by his infectious disease doctor.  On evaluation Lee Bridges reports that he is in need of refills on Cymbalta  and Seroquel , which he was discharged on from [facility] in May.  Patient states that he did not follow-up with outpatient resources and has been out of his medications.  He reports getting a refill of Seroquel  from his infectious disease doctor for a few days supply.  Patient reports that his psychotic symptoms are resolved and stabilized on Seroquel  300 mg nightly.  He reports intermittent auditory hallucinations when he is alone at home that say positive things.  He denies command auditory donations and visual hallucinations.  Patient reports depressive symptoms including anhedonia, poor concentration and overall feelings of sadness.  He denies any suicidal ideations or thoughts of self-harm.  He denies homicidal ideations and aggressive behaviors.  Patient does report having a history of having suicidal ideations in the past and does not want his current mood to escalate to having those thoughts.  Patient reports that he was on  Cymbalta  60 mg and would like to restart that for depression as it was previously very effective for both his anxiety and depression.  Patient was previously being seen outpatient at [facility] and plans to return there this week as a walk-in to follow-up with medication management.  Patient denies current substance use and is open about history of cocaine and methamphetamine use months ago.  During evaluation Lee Bridges is sitting in assessment room, in no acute distress.  He is alert & oriented x 4, calm, cooperative and attentive for this assessment.  His mood is somewhat depressed with congruent affect.  He has normal speech, and behavior.  Objectively there is no evidence of psychosis/mania or delusional thinking. Pt does not appear to be responding to internal or external stimuli.  Patient is able to converse coherently, goal directed thoughts, no distractibility, or pre-occupation.  He denies current suicidal/self-harm/homicidal ideation, psychosis, and paranoia.  Patient answered assessment questions appropriately.      Flowsheet Row ED from 10/09/2023 in [facility] Department at [facility] Admission (Discharged) [facility] from 09/05/2023 in [facility] Emergency Department at [facility]  C-SSRS RISK CATEGORY No Risk High Risk High Risk    Psychiatric Specialty Exam  Presentation  General Appearance:Appropriate for Environment  Eye Contact:Good  Speech:Clear and Coherent  Speech Volume:Normal  Handedness:Right   Mood and Affect  Mood: Depressed  Affect: Appropriate   Thought Process  Thought Processes: Coherent; Linear  Descriptions of Associations:Intact  Orientation:Full (Time, Place and Person)  Thought Content:WDL  Diagnosis of Schizophrenia or Schizoaffective disorder in past:  No  Duration of Psychotic Symptoms: Greater than six months   Hallucinations:Auditory Reports AH at times when alone that say positive things. Deneis active AH.  Ideas of Reference:None  Suicidal Thoughts:No With Plan  Homicidal Thoughts:No   Sensorium  Memory: Immediate Fair; Recent Fair  Judgment: Fair  Insight: Fair   Chartered Certified Accountant: Fair  Attention Span: Fair  Recall: [employer] of Knowledge: Fair  Language: Good   Psychomotor Activity  Psychomotor Activity: Normal   Assets  Assets: Desire for Improvement; Housing; Social Support; Manufacturing Systems Engineer; Intimacy; Leisure Time; Resilience   Sleep  Sleep: Fair  Number of hours:  6   Physical Exam: Physical Exam Vitals and nursing note reviewed.  Constitutional:      Appearance: Normal appearance.  HENT:     Head: Normocephalic.     Nose: Nose normal.  Eyes:     Extraocular Movements: Extraocular movements intact.  Cardiovascular:     Rate and Rhythm: Normal rate.  Pulmonary:     Effort: Pulmonary effort is normal.  Musculoskeletal:        General: Normal range of motion.     Cervical back: Normal range of motion.  Neurological:     General: No focal deficit present.     Mental Status: He is alert and oriented to person, place, and time.    Review of Systems  Constitutional: Negative.   HENT: Negative.    Eyes: Negative.   Respiratory: Negative.    Cardiovascular: Negative.   Gastrointestinal: Negative.   Genitourinary: Negative.   Musculoskeletal: Negative.   Neurological: Negative.   Endo/Heme/Allergies: Negative.   Psychiatric/Behavioral:  Positive for depression.    Blood pressure (!) 141/83, pulse 100, temperature 97.7 F (36.5 C), temperature source Oral, resp. rate 18, SpO2 100%. There is no height or weight on file to calculate BMI.  Musculoskeletal: Strength & Muscle Tone: within normal limits Gait & Station: normal Patient leans: N/A   [facility] MSE Discharge Disposition for Follow up and  Recommendations: Based on my evaluation the patient does not appear to have an emergency medical condition and can be discharged with resources and follow up care in outpatient services for Medication Management and Individual Therapy  Patient is discharged home with 10-day supply of Seroquel  300 mg nightly for psychotic symptoms and Cymbalta  20 mg daily for depressive symptoms.  Patient verbalized understanding of medication education.    No safety concerns at time of discharge as patient denies suicidal ideations, homicidal ideations and psychotic symptoms.  Patient provided with outpatient resources and has agreed to return for outpatient medication management and therapy at [facility].  Hours provided for open access walk-in services.  You are encouraged to follow up with [facility] for outpatient treatment.  Walk in/ Open Access Hours: Monday - Friday 8AM - 10AM (To see provider and therapist) Friday - 1PM - 4PM (To see therapist only)  [facility] [address] [place], [place] [phone]  Lee JAYSON Mcardle, NP 05/12/2024, 4:10 PM

## 2024-05-12 NOTE — Progress Notes (Signed)
°   05/12/24 1337  [facility] Triage Screening (Walk-ins at [facility] only)  How Did You Hear About Us ? Self  What Is the Reason for Your Visit/Call Today? Patient is a 34 year old male who presents to the [facility] voluntarily.  Patient has unaccompanied requesting change in medication.  Patient states that he is currently taking Seroquel  but ran out about a month ago due to missing an appointment with his provider.  Patient's PCP gave him an emergency refill but is unable to continue.  Patient states he has been out for about a week and a half.  He reports symptoms of depression stating loss of interest in activities he usually enjoys, poor concentration, and feelings of hopelessness.  Patient has had several inpatient hospitalizations the last 1 being approximately 5 months ago at [facility].  Patient also reports having auditory and visual hallucinations usually when he is by himself.  The voices do not seem to be commanding.  Patient denies having any SI HI.  He also denies using any alcohol or illicit substances.  Patient has history of cocaine and methamphetamine use in the past but reports that that about a month since he used any substances.  How Long Has This Been Causing You Problems? 1 wk - 1 month  Have You Recently Had Any Thoughts About Hurting Yourself? No  Are You Planning to Commit Suicide/Harm Yourself At This time? No  Have you Recently Had Thoughts About Hurting Someone Sherral? No  Are You Planning To Harm Someone At This Time? No  Physical Abuse Denies  Verbal Abuse Denies  Sexual Abuse Denies  Exploitation of patient/patient's resources Denies  Self-Neglect Denies  Possible abuse reported to:  (n/a)  Are you currently experiencing any auditory, visual or other hallucinations? Yes  Please explain the hallucinations you are currently experiencing: Occasionally seeing things going on by myself as well as hearing voices that are noncommanding.  Have You Used Any Alcohol or Drugs in the Past 24 Hours? No   Do you have any current medical co-morbidities that require immediate attention? No  Clinician description of patient physical appearance/behavior: Patient is neatly dressed calm and cooperative  What Do You Feel Would Help You the Most Today? Medication(s)  If access to [facility] was not available, would you have sought care in the Emergency Department? No  Determination of Need Routine (7 days)  Options For Referral Medication Management;Outpatient Therapy

## 2024-05-12 NOTE — Discharge Instructions (Addendum)
 Discharge recommendations:   Medications: Take Cymbalta  20mg  daily for depression and continue Seroquel  300mg  at bedtime for psychotic symptoms. Patient is to take medications as prescribed. The patient or patient's guardian is to contact a medical professional and/or outpatient provider to address any new side effects that develop. The patient or the patient's guardian should update outpatient providers of any new medications and/or medication changes.    Outpatient Follow up: Please follow up upstairs via walk-in services Mon-Friday at 7:00AM. Please review list of outpatient resources for psychiatry and counseling. Please follow up with your primary care provider for all medical related needs.    Therapy: We recommend that patient participate in individual therapy to address mental health concerns.   Atypical antipsychotics: If you are prescribed an atypical antipsychotic, it is recommended that your height, weight, BMI, blood pressure, fasting lipid panel, and fasting blood sugar be monitored by your outpatient providers.  Safety:   The following safety precautions should be taken:   No sharp objects. This includes scissors, razors, scrapers, and putty knives.   Chemicals should be removed and locked up.   Medications should be removed and locked up.   Weapons should be removed and locked up. This includes firearms, knives and instruments that can be used to cause injury.   The patient should abstain from use of illicit substances/drugs and abuse of any medications.  If symptoms worsen or do not continue to improve or if the patient becomes actively suicidal or homicidal then it is recommended that the patient return to the closest hospital emergency department, the [facility], or call 911 for further evaluation and treatment. National Suicide Prevention Lifeline 1-800-SUICIDE or 585-864-1946.  About 988 988 offers 24/7 access to trained crisis  counselors who can help people experiencing mental health-related distress. People can call or text 988 or chat 988lifeline.org for themselves or if they are worried about a loved one who may need crisis support.    You are encouraged to follow up with [facility] for outpatient treatment.  Walk in/ Open Access Hours: Monday - Friday AT 7:00AM on the second floor   [facility] [address] [place], [place] [phone]

## 2024-07-25 ENCOUNTER — Ambulatory Visit: Payer: MEDICAID | Admitting: Infectious Diseases
